# Patient Record
Sex: Female | Born: 1950 | Race: Black or African American | Hispanic: No | Smoking: Former smoker
Health system: Southern US, Community
[De-identification: ages and names within clinical notes are randomized; demographics above are authoritative.]

## PROBLEM LIST (undated history)

## (undated) DIAGNOSIS — R0602 Shortness of breath: Secondary | ICD-10-CM

## (undated) DIAGNOSIS — I10 Essential (primary) hypertension: Secondary | ICD-10-CM

## (undated) DIAGNOSIS — J189 Pneumonia, unspecified organism: Secondary | ICD-10-CM

## (undated) DIAGNOSIS — E538 Deficiency of other specified B group vitamins: Secondary | ICD-10-CM

## (undated) DIAGNOSIS — E7800 Pure hypercholesterolemia, unspecified: Secondary | ICD-10-CM

## (undated) DIAGNOSIS — N390 Urinary tract infection, site not specified: Secondary | ICD-10-CM

## (undated) DIAGNOSIS — R7303 Prediabetes: Secondary | ICD-10-CM

## (undated) DIAGNOSIS — M797 Fibromyalgia: Secondary | ICD-10-CM

## (undated) DIAGNOSIS — E119 Type 2 diabetes mellitus without complications: Secondary | ICD-10-CM

## (undated) DIAGNOSIS — E739 Lactose intolerance, unspecified: Secondary | ICD-10-CM

## (undated) DIAGNOSIS — K921 Melena: Secondary | ICD-10-CM

## (undated) DIAGNOSIS — E559 Vitamin D deficiency, unspecified: Secondary | ICD-10-CM

## (undated) DIAGNOSIS — K0889 Other specified disorders of teeth and supporting structures: Secondary | ICD-10-CM

## (undated) DIAGNOSIS — K5900 Constipation, unspecified: Secondary | ICD-10-CM

## (undated) DIAGNOSIS — F419 Anxiety disorder, unspecified: Secondary | ICD-10-CM

## (undated) DIAGNOSIS — M2550 Pain in unspecified joint: Secondary | ICD-10-CM

## (undated) DIAGNOSIS — M549 Dorsalgia, unspecified: Secondary | ICD-10-CM

## (undated) DIAGNOSIS — K635 Polyp of colon: Secondary | ICD-10-CM

## (undated) DIAGNOSIS — K219 Gastro-esophageal reflux disease without esophagitis: Secondary | ICD-10-CM

## (undated) DIAGNOSIS — J45909 Unspecified asthma, uncomplicated: Secondary | ICD-10-CM

## (undated) DIAGNOSIS — M109 Gout, unspecified: Secondary | ICD-10-CM

## (undated) DIAGNOSIS — K5792 Diverticulitis of intestine, part unspecified, without perforation or abscess without bleeding: Secondary | ICD-10-CM

## (undated) DIAGNOSIS — I509 Heart failure, unspecified: Secondary | ICD-10-CM

## (undated) HISTORY — PX: OVARIAN CYST REMOVAL: SHX89

## (undated) HISTORY — DX: Polyp of colon: K63.5

## (undated) HISTORY — DX: Fibromyalgia: M79.7

## (undated) HISTORY — DX: Urinary tract infection, site not specified: N39.0

## (undated) HISTORY — PX: LIPOMA EXCISION: SHX5283

## (undated) HISTORY — DX: Prediabetes: R73.03

## (undated) HISTORY — DX: Gastro-esophageal reflux disease without esophagitis: K21.9

## (undated) HISTORY — DX: Vitamin D deficiency, unspecified: E55.9

## (undated) HISTORY — DX: Melena: K92.1

## (undated) HISTORY — DX: Constipation, unspecified: K59.00

## (undated) HISTORY — DX: Lactose intolerance, unspecified: E73.9

## (undated) HISTORY — DX: Pneumonia, unspecified organism: J18.9

## (undated) HISTORY — DX: Pain in unspecified joint: M25.50

## (undated) HISTORY — PX: MENISCUS REPAIR: SHX5179

## (undated) HISTORY — PX: OVARIAN CYST SURGERY: SHX726

## (undated) HISTORY — DX: Dorsalgia, unspecified: M54.9

## (undated) HISTORY — PX: APPENDECTOMY: SHX54

## (undated) HISTORY — DX: Anxiety disorder, unspecified: F41.9

## (undated) HISTORY — DX: Heart failure, unspecified: I50.9

## (undated) HISTORY — DX: Shortness of breath: R06.02

## (undated) HISTORY — DX: Deficiency of other specified B group vitamins: E53.8

## (undated) HISTORY — DX: Other specified disorders of teeth and supporting structures: K08.89

---

## 2011-08-28 DIAGNOSIS — I10 Essential (primary) hypertension: Secondary | ICD-10-CM | POA: Insufficient documentation

## 2011-08-28 DIAGNOSIS — K219 Gastro-esophageal reflux disease without esophagitis: Secondary | ICD-10-CM | POA: Insufficient documentation

## 2013-08-11 DIAGNOSIS — E119 Type 2 diabetes mellitus without complications: Secondary | ICD-10-CM | POA: Insufficient documentation

## 2013-08-16 DIAGNOSIS — E876 Hypokalemia: Secondary | ICD-10-CM | POA: Insufficient documentation

## 2014-05-21 DIAGNOSIS — R200 Anesthesia of skin: Secondary | ICD-10-CM | POA: Insufficient documentation

## 2014-07-06 DIAGNOSIS — G5621 Lesion of ulnar nerve, right upper limb: Secondary | ICD-10-CM | POA: Insufficient documentation

## 2014-07-06 DIAGNOSIS — G5601 Carpal tunnel syndrome, right upper limb: Secondary | ICD-10-CM | POA: Insufficient documentation

## 2014-07-24 DIAGNOSIS — Z9889 Other specified postprocedural states: Secondary | ICD-10-CM | POA: Insufficient documentation

## 2015-06-19 DIAGNOSIS — R0602 Shortness of breath: Secondary | ICD-10-CM | POA: Insufficient documentation

## 2015-06-19 DIAGNOSIS — J45909 Unspecified asthma, uncomplicated: Secondary | ICD-10-CM | POA: Insufficient documentation

## 2016-02-18 DIAGNOSIS — Z6841 Body Mass Index (BMI) 40.0 and over, adult: Secondary | ICD-10-CM | POA: Insufficient documentation

## 2016-08-07 ENCOUNTER — Ambulatory Visit (INDEPENDENT_AMBULATORY_CARE_PROVIDER_SITE_OTHER): Payer: BC Managed Care – PPO

## 2016-08-07 ENCOUNTER — Ambulatory Visit (INDEPENDENT_AMBULATORY_CARE_PROVIDER_SITE_OTHER): Payer: BC Managed Care – PPO | Admitting: Podiatry

## 2016-08-07 ENCOUNTER — Encounter: Payer: Self-pay | Admitting: Podiatry

## 2016-08-07 DIAGNOSIS — M722 Plantar fascial fibromatosis: Secondary | ICD-10-CM | POA: Diagnosis not present

## 2016-08-07 DIAGNOSIS — E559 Vitamin D deficiency, unspecified: Secondary | ICD-10-CM | POA: Insufficient documentation

## 2016-08-07 NOTE — Progress Notes (Signed)
   Subjective:    Patient ID: Beth Macias, female    DOB: [birth year], 66 y.o.   MRN: [id number]  HPI: She presents today with a history of pain to her right foot 4 years. She states that as that on off recently started hurting severely. She states she is shooting pains into her leg. She has been seeing a podiatrist in [place]. 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    Review of Systems  Musculoskeletal: Positive for back pain and gait problem.  All other systems reviewed and are negative.      Objective:   Physical Exam: Vital signs are stable alert and oriented 3. Pulses are palpable. Neurologic sensorium is intact. Deep tendon reflexes are intact. Muscle strength is 5 over 5 dorsiflexion plantar flexors and inverters everters all intrinsic musculature is intact. Orthopedic evaluation of his wrist pain on palpation medial calcaneal tubercle of the right heel. Radiographs demonstrate pes planus with severe plantar distally oriented calcaneal spur and thickening of the plantar fascia surrounding the spur. Is indicative of plantar fasciitis. Cutaneous evaluation of his recent well-hydrated cutis no lesions or wounds are noted.        Assessment & Plan:  Plantar fasciitis with a history of diabetes right foot.  Plan: Discussed etiology and pathology conservative versus surgical therapies. Injected the right heel today placed her in a brace and a night splint. Discussed appropriate shoe gear stretching exercises ice therapy and shoe gear modifications. Started her on no medications at this time due to her diabetes. I will follow up with her in 1 month.

## 2016-08-07 NOTE — Patient Instructions (Signed)

## 2016-09-04 ENCOUNTER — Encounter: Payer: Self-pay | Admitting: Podiatry

## 2016-09-04 ENCOUNTER — Ambulatory Visit (INDEPENDENT_AMBULATORY_CARE_PROVIDER_SITE_OTHER): Payer: BC Managed Care – PPO | Admitting: Podiatry

## 2016-09-04 DIAGNOSIS — M722 Plantar fascial fibromatosis: Secondary | ICD-10-CM

## 2016-09-05 ENCOUNTER — Telehealth: Payer: Self-pay | Admitting: *Deleted

## 2016-09-05 NOTE — Telephone Encounter (Addendum)
-----   Message from Roney Jaffe, RN sent at 09/04/2016  3:59 PM EDT ----- MRI orders for Rt heel r/o plantar fascial rupture, orders are in  [place]. 09/05/2016-Orders given to [facility] for pre-cert and faxed to [facility].09/17/2016-[employer] denied MRI of right heel. Routed the appeals line [phone] to speak with AIM Physician Reviewer.09/26/2016-Faxed letter with copy of clinicals requesting appeal for MRI of right heel [id number]/23/2016-I called pt to see if she had any improvement in the right heel pain and if she wanted to continue with getting an MRI. 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 Pt states she has a problem in the 5th and 6th disc of her back and is currently working on that. I told pt I could get her in to see Dr. Milinda Pointer, but she states she would like to wait until 12/2016 to be seen again.

## 2016-09-07 NOTE — Progress Notes (Signed)
She presents today for follow-up of plantar fasciitis to the right foot. She states that she is having significantly more pain at this point. She states that initially it had improved considerably but has worsens. She would like to have the spurs removed.  Objective: Vital signs are stable she is alert and oriented 3. Pulses are palpable. I highly recommended to her today not to have this spurs removed. That it is really only the plantar fascia. I demonstrated that today by palpate the plantar medial aspect of the foot. She understands that.  Assessment: Plantar fasciitis right foot intractable.  Plan: Requested MRI for surgical evaluation.

## 2016-09-17 NOTE — Telephone Encounter (Signed)
Need case number etc.

## 2016-09-26 ENCOUNTER — Encounter: Payer: Self-pay | Admitting: *Deleted

## 2017-08-12 ENCOUNTER — Other Ambulatory Visit: Payer: Self-pay | Admitting: Family Medicine

## 2017-08-12 DIAGNOSIS — Z139 Encounter for screening, unspecified: Secondary | ICD-10-CM

## 2017-08-16 ENCOUNTER — Emergency Department (HOSPITAL_COMMUNITY)
Admission: EM | Admit: 2017-08-16 | Discharge: 2017-08-17 | Disposition: A | Discharge status: Home / Self Care | Payer: BC Managed Care – PPO | Attending: Emergency Medicine | Admitting: Emergency Medicine

## 2017-08-16 ENCOUNTER — Encounter (HOSPITAL_COMMUNITY): Payer: Self-pay

## 2017-08-16 ENCOUNTER — Other Ambulatory Visit: Payer: Self-pay

## 2017-08-16 ENCOUNTER — Emergency Department (HOSPITAL_COMMUNITY): Payer: BC Managed Care – PPO

## 2017-08-16 DIAGNOSIS — Z79899 Other long term (current) drug therapy: Secondary | ICD-10-CM | POA: Diagnosis not present

## 2017-08-16 DIAGNOSIS — I10 Essential (primary) hypertension: Secondary | ICD-10-CM | POA: Diagnosis not present

## 2017-08-16 DIAGNOSIS — Z7984 Long term (current) use of oral hypoglycemic drugs: Secondary | ICD-10-CM | POA: Insufficient documentation

## 2017-08-16 DIAGNOSIS — E7800 Pure hypercholesterolemia, unspecified: Secondary | ICD-10-CM | POA: Insufficient documentation

## 2017-08-16 DIAGNOSIS — E119 Type 2 diabetes mellitus without complications: Secondary | ICD-10-CM | POA: Insufficient documentation

## 2017-08-16 DIAGNOSIS — M545 Low back pain: Secondary | ICD-10-CM | POA: Diagnosis present

## 2017-08-16 DIAGNOSIS — M5431 Sciatica, right side: Secondary | ICD-10-CM | POA: Diagnosis not present

## 2017-08-16 DIAGNOSIS — J45909 Unspecified asthma, uncomplicated: Secondary | ICD-10-CM | POA: Insufficient documentation

## 2017-08-16 HISTORY — DX: Pure hypercholesterolemia, unspecified: E78.00

## 2017-08-16 HISTORY — DX: Unspecified asthma, uncomplicated: J45.909

## 2017-08-16 HISTORY — DX: Essential (primary) hypertension: I10

## 2017-08-16 HISTORY — DX: Type 2 diabetes mellitus without complications: E11.9

## 2017-08-16 MED ORDER — NAPROXEN 500 MG PO TABS: 500.0000 mg | ORAL_TABLET | Freq: Two times a day (BID) | ORAL | 0 refills | Status: DC

## 2017-08-16 MED ORDER — LIDOCAINE 5 % EX PTCH: 1.0000 | MEDICATED_PATCH | CUTANEOUS | 0 refills | Status: DC

## 2017-08-16 MED ORDER — HYDROCODONE-ACETAMINOPHEN 5-325 MG PO TABS
1.0000 | ORAL_TABLET | Freq: Once | ORAL | Status: AC
  Administered 2017-08-16: 1 via ORAL
  Filled 2017-08-16: qty 1

## 2017-08-16 NOTE — ED Triage Notes (Signed)
Onset this morning pt pulled and pushed a cart and felt a "pull" in right lower back.  Pt sat on floor and tried to do some exercise stretches with little relief.  Pt took Naproxen x 2 @ 6pm with little relief.  Hurts worse when trying to stand and straighten up.

## 2017-08-17 NOTE — ED Notes (Signed)
Pt able to ambulate slowly, independently. Pt verbalizes understanding of d/c instructions. Pt received prescriptions. Pt taken out to lobby at d/c with all belongings.

## 2017-08-17 NOTE — Discharge Instructions (Signed)
Take naproxen twice a day with meals.  Do not take other anti-inflammatories at the same time (Advil, Motrin, ibuprofen, Aleve). You may supplement with Tylenol if you need further pain control. Use lidocaine patches as needed for further pain. Try not to remain seated, this will make your symptoms worse. You may try gentle stretches to help with pain. Follow-up with the orthopedic doctor if your symptoms are not improving. Return to the emergency room if you develop loss of bowel or bladder control, worsening weakness, inability to walk, or any new or concerning symptoms.

## 2017-08-17 NOTE — ED Provider Notes (Signed)
[facility] EMERGENCY DEPARTMENT Provider Note   CSN: [id number] Arrival date & time: 08/16/17  2044     History   Chief Complaint Chief Complaint  Patient presents with  . Back Pain    HPI Beth Macias is a 67 y.o. female presenting for evaluation of right-sided back/hip pain.  Patient states that she was pushing something this morning when she felt a pop in her right buttock.  She had acute onset pain.  This gradually worsened throughout the day.  She has associated abnormal sensation in her right leg that extends past her knee.  She states that she feels weak in the right leg.  She denies fall or trauma.  She states she has a history of sciatica, but this feels different.  She denies fevers, chills, loss of bowel or bladder control, history of cancer, or history of IV drug use.  She took 2 Aleve with mild improvement of symptoms.  Movement makes the pain worse, nothing makes it better.  She is a patient of Dr. Ron Agee with [facility].  She denies pain or injury elsewhere including of the left leg.  HPI  Past Medical History:  Diagnosis Date  . Asthma   . Diabetes mellitus without complication ([place])   . Hypercholesteremia   . Hypertension     Patient Active Problem List   Diagnosis Date Noted  . Vitamin D deficiency 08/07/2016  . Morbid obesity with BMI of 40.0-44.9, adult ([place]) 02/18/2016  . Extrinsic asthma without complication 41/32/4401  . SOB (shortness of breath) 06/19/2015  . S/P carpal tunnel release 07/24/2014  . S/P cubital tunnel release 07/24/2014  . Cubital tunnel syndrome, right 07/06/2014  . Right carpal tunnel syndrome 07/06/2014  . Numbness of right hand 05/21/2014  . Hypokalemia 08/16/2013  . Diabetes mellitus type 2, uncomplicated ([place]) 02/72/5366  . GERD (gastroesophageal reflux disease) 08/28/2011  . HTN (hypertension) 08/28/2011    Past Surgical History:  Procedure Laterality Date  . APPENDECTOMY    . CESAREAN SECTION      x 3  . KNEE SURGERY    . LIPOMA EXCISION    . OVARIAN CYST REMOVAL       OB History   None      Home Medications    Prior to Admission medications   Medication Sig Start Date End Date Taking? Authorizing Provider  amLODipine (NORVASC) 5 MG tablet TK 1 T PO ONCE D 05/08/16   [provider]  colchicine 0.6 MG tablet TK 2 T PO FOR THE FIRST DOSE THEN 1 T ONE HOUR AFTER THE FIRST DOSE 07/12/16   [provider]  HYDROcodone-acetaminophen (NORCO/VICODIN) 5-325 MG tablet TAKE 1 TABLET BY MOUTH EVERY 6 TO 8 HOURS AS NEEDED 05/11/16   [provider]  lidocaine (LIDODERM) 5 % Place 1 patch onto the skin daily. Remove & Discard patch within 12 hours or as directed by MD 08/16/17   Keveon Amsler, PA-C  metFORMIN (GLUCOPHAGE-XR) 500 MG 24 hr tablet  07/01/16   [provider]  naproxen (NAPROSYN) 500 MG tablet Take 1 tablet (500 mg total) by mouth 2 (two) times daily with a meal. 08/16/17   Baer Hinton, PA-C  PROAIR HFA 108 (90 Base) MCG/ACT inhaler INHALE 2 PUFFS PO Q 4 H PRN FOR WHEEZING 05/08/16   [provider]  triamterene-hydrochlorothiazide (MAXZIDE-25) 37.5-25 MG tablet TK 1 T PO QD 06/10/16   [provider]    Family History History reviewed. No pertinent family  history.  Social History Social History   Tobacco Use  . Smoking status: Never Smoker  . Smokeless tobacco: Never Used  Substance Use Topics  . Alcohol use: Never    Frequency: Never  . Drug use: Never     Allergies   Penicillins and Tape   Review of Systems Review of Systems  Musculoskeletal: Positive for back pain and myalgias.  Skin: Negative for wound.  Neurological: Negative for numbness.     Physical Exam Updated Vital Signs BP (!) 150/83   Pulse 78   Temp 98 F (36.7 C) (Oral)   Resp 18   Ht 5' (1.524 m)   Wt 102.1 kg (225 lb)   SpO2 98%   BMI 43.94 kg/m   Physical Exam  Constitutional: She is oriented to person, place,  and time. She appears well-developed and well-nourished. No distress.  HENT:  Head: Normocephalic and atraumatic.  Eyes: Pupils are equal, round, and reactive to light. Conjunctivae and EOM are normal.  Neck: Normal range of motion. Neck supple.  Cardiovascular: Normal rate, regular rhythm and intact distal pulses.  Pulmonary/Chest: Effort normal and breath sounds normal. No respiratory distress. She has no wheezes.  Abdominal: Soft. She exhibits no distension and no mass. There is no tenderness. There is no guarding.  Musculoskeletal: She exhibits tenderness.  TTP of R lower back, R gluteus, and R hamstring. No obvious deformity. Pedal pulses intact bilaterally. Pt reports decreased sensation of lateral lower leg. No obvious strength difference.   Neurological: She is alert and oriented to person, place, and time. She displays normal reflexes.  Skin: Skin is warm and dry.  Psychiatric: She has a normal mood and affect.  Nursing note and vitals reviewed.    ED Treatments / Results  Labs (all labs ordered are listed, but only abnormal results are displayed) Labs Reviewed - No data to display  EKG None  Radiology Dg Hip Unilat W Or Wo Pelvis 2-3 Views Right  Result Date: 08/16/2017 CLINICAL DATA:  Right hip pain EXAM: DG HIP (WITH OR WITHOUT PELVIS) 2-3V RIGHT COMPARISON:  None. FINDINGS: There is no evidence of hip fracture or dislocation. Mild osteoarthrosis of both hips. IMPRESSION: No fracture or dislocation of the right hip. Mild bilateral hip osteoarthrosis. Electronically Signed   By: Ulyses Jarred M.D.   On: 08/16/2017 23:42    Procedures Procedures (including critical care time)  Medications Ordered in ED Medications  HYDROcodone-acetaminophen (NORCO/VICODIN) 5-325 MG per tablet 1 tablet (1 tablet Oral Given 08/16/17 2250)     Initial Impression / Assessment and Plan / ED Course  I have reviewed the triage vital signs and the nursing notes.  Pertinent labs & imaging  results that were available during my care of the patient were reviewed by me and considered in my medical decision making (see chart for details).     Patient presenting for evaluation of right-sided low back/buttock pain.  Physical exam shows patient without obvious neurologic deficits.  Tenderness palpation is worse over the gluteal aspect.  As she is 67 and heard a pop, will obtain x-ray to ensure no bony abnormality.  Norco for pain.  Case discussed with attending, Dr. Rogene Houston agrees to plan.  On reassessment, patient reports pain is much improved.  Ambulated in hallway without difficulty.  Likely sciatica.  At this time, doubt vertebral injury, infection, spinal cord compression, myelopathy, or cauda equina syndrome.  Discussed treatment with NSAIDs and lidocaine patch.  Patient does not want a muscle relaxer.  Follow-up with orthopedics as needed.  At this time, patient present for discharge.  Return precautions given.  Patient states she understands and agrees to plan.   Final Clinical Impressions(s) / ED Diagnoses   Final diagnoses:  Sciatica of right side    ED Discharge Orders        Ordered    naproxen (NAPROSYN) 500 MG tablet  2 times daily with meals     08/16/17 2357    lidocaine (LIDODERM) 5 %  Every 24 hours     08/16/17 2357       Sudiksha Victor, PA-C 08/17/17 0051    Fredia Sorrow, MD 08/20/17 [phone]

## 2017-08-25 ENCOUNTER — Other Ambulatory Visit: Payer: Self-pay | Admitting: Physical Medicine and Rehabilitation

## 2017-08-25 DIAGNOSIS — M79604 Pain in right leg: Secondary | ICD-10-CM

## 2017-08-25 DIAGNOSIS — M7918 Myalgia, other site: Secondary | ICD-10-CM

## 2017-08-25 DIAGNOSIS — R200 Anesthesia of skin: Secondary | ICD-10-CM

## 2017-08-25 DIAGNOSIS — M545 Low back pain: Secondary | ICD-10-CM

## 2017-08-25 DIAGNOSIS — R202 Paresthesia of skin: Secondary | ICD-10-CM

## 2017-08-26 ENCOUNTER — Ambulatory Visit
Admission: RE | Admit: 2017-08-26 | Discharge: 2017-08-26 | Disposition: A | Discharge status: Home / Self Care | Payer: BC Managed Care – PPO | Source: Ambulatory Visit | Attending: Physical Medicine and Rehabilitation | Admitting: Physical Medicine and Rehabilitation

## 2017-08-26 DIAGNOSIS — R200 Anesthesia of skin: Secondary | ICD-10-CM

## 2017-08-26 DIAGNOSIS — M7918 Myalgia, other site: Secondary | ICD-10-CM

## 2017-08-26 DIAGNOSIS — M79604 Pain in right leg: Secondary | ICD-10-CM

## 2017-08-26 DIAGNOSIS — R202 Paresthesia of skin: Secondary | ICD-10-CM

## 2017-08-26 DIAGNOSIS — M545 Low back pain: Secondary | ICD-10-CM

## 2017-08-31 ENCOUNTER — Ambulatory Visit
Admission: RE | Admit: 2017-08-31 | Discharge: 2017-08-31 | Disposition: A | Discharge status: Home / Self Care | Payer: BC Managed Care – PPO | Source: Ambulatory Visit | Attending: Family Medicine | Admitting: Family Medicine

## 2017-08-31 DIAGNOSIS — Z139 Encounter for screening, unspecified: Secondary | ICD-10-CM

## 2017-09-01 ENCOUNTER — Other Ambulatory Visit: Payer: Self-pay | Admitting: Physical Medicine and Rehabilitation

## 2017-09-01 DIAGNOSIS — M545 Low back pain: Principal | ICD-10-CM

## 2017-09-01 DIAGNOSIS — G8929 Other chronic pain: Secondary | ICD-10-CM

## 2017-09-09 ENCOUNTER — Ambulatory Visit
Admission: RE | Admit: 2017-09-09 | Discharge: 2017-09-09 | Disposition: A | Discharge status: Home / Self Care | Payer: BC Managed Care – PPO | Source: Ambulatory Visit | Attending: Physical Medicine and Rehabilitation | Admitting: Physical Medicine and Rehabilitation

## 2017-09-09 ENCOUNTER — Other Ambulatory Visit: Payer: Self-pay | Admitting: Physical Medicine and Rehabilitation

## 2017-09-09 DIAGNOSIS — G8929 Other chronic pain: Secondary | ICD-10-CM

## 2017-09-09 DIAGNOSIS — M545 Low back pain: Principal | ICD-10-CM

## 2017-09-09 MED ORDER — IOPAMIDOL (ISOVUE-M 200) INJECTION 41%
1.0000 mL | Freq: Once | INTRAMUSCULAR | Status: AC
  Administered 2017-09-09: 1 mL via EPIDURAL

## 2017-09-09 MED ORDER — METHYLPREDNISOLONE ACETATE 40 MG/ML INJ SUSP (RADIOLOG
120.0000 mg | Freq: Once | INTRAMUSCULAR | Status: AC
  Administered 2017-09-09: 120 mg via EPIDURAL

## 2017-09-09 NOTE — Discharge Instructions (Signed)

## 2017-09-23 ENCOUNTER — Other Ambulatory Visit: Payer: Self-pay | Admitting: Family Medicine

## 2017-09-23 DIAGNOSIS — Z1231 Encounter for screening mammogram for malignant neoplasm of breast: Secondary | ICD-10-CM

## 2017-10-02 ENCOUNTER — Other Ambulatory Visit: Payer: Self-pay | Admitting: Physical Medicine and Rehabilitation

## 2017-10-02 DIAGNOSIS — M79604 Pain in right leg: Secondary | ICD-10-CM

## 2017-10-08 ENCOUNTER — Other Ambulatory Visit: Payer: Self-pay | Admitting: Physical Medicine and Rehabilitation

## 2017-10-08 ENCOUNTER — Ambulatory Visit
Admission: RE | Admit: 2017-10-08 | Discharge: 2017-10-08 | Disposition: A | Discharge status: Home / Self Care | Payer: BC Managed Care – PPO | Source: Ambulatory Visit | Attending: Physical Medicine and Rehabilitation | Admitting: Physical Medicine and Rehabilitation

## 2017-10-08 DIAGNOSIS — M79604 Pain in right leg: Secondary | ICD-10-CM

## 2017-10-08 MED ORDER — IOPAMIDOL (ISOVUE-M 200) INJECTION 41%
1.0000 mL | Freq: Once | INTRAMUSCULAR | Status: AC
  Administered 2017-10-08: 1 mL via EPIDURAL

## 2017-10-08 MED ORDER — METHYLPREDNISOLONE ACETATE 40 MG/ML INJ SUSP (RADIOLOG
120.0000 mg | Freq: Once | INTRAMUSCULAR | Status: AC
  Administered 2017-10-08: 120 mg via EPIDURAL

## 2017-10-08 MED ORDER — DIAZEPAM 5 MG PO TABS
5.0000 mg | ORAL_TABLET | Freq: Once | ORAL | Status: AC
  Administered 2017-10-08: 5 mg via ORAL

## 2017-10-08 NOTE — Discharge Instructions (Signed)

## 2018-03-25 ENCOUNTER — Encounter: Payer: Self-pay | Admitting: Family Medicine

## 2018-03-25 LAB — HM DIABETES EYE EXAM

## 2018-04-04 DIAGNOSIS — N390 Urinary tract infection, site not specified: Secondary | ICD-10-CM | POA: Insufficient documentation

## 2018-04-04 DIAGNOSIS — J069 Acute upper respiratory infection, unspecified: Secondary | ICD-10-CM | POA: Insufficient documentation

## 2018-04-04 DIAGNOSIS — E119 Type 2 diabetes mellitus without complications: Secondary | ICD-10-CM | POA: Insufficient documentation

## 2018-04-07 ENCOUNTER — Emergency Department (HOSPITAL_COMMUNITY): Payer: BC Managed Care – PPO

## 2018-04-07 ENCOUNTER — Encounter (HOSPITAL_COMMUNITY): Payer: Self-pay

## 2018-04-07 ENCOUNTER — Emergency Department (HOSPITAL_COMMUNITY)
Admission: EM | Admit: 2018-04-07 | Discharge: 2018-04-07 | Disposition: A | Discharge status: Home / Self Care | Payer: BC Managed Care – PPO | Attending: Emergency Medicine | Admitting: Emergency Medicine

## 2018-04-07 ENCOUNTER — Encounter: Payer: Self-pay | Admitting: Gastroenterology

## 2018-04-07 ENCOUNTER — Other Ambulatory Visit: Payer: Self-pay

## 2018-04-07 DIAGNOSIS — J45909 Unspecified asthma, uncomplicated: Secondary | ICD-10-CM | POA: Diagnosis not present

## 2018-04-07 DIAGNOSIS — E119 Type 2 diabetes mellitus without complications: Secondary | ICD-10-CM | POA: Diagnosis not present

## 2018-04-07 DIAGNOSIS — I10 Essential (primary) hypertension: Secondary | ICD-10-CM | POA: Insufficient documentation

## 2018-04-07 DIAGNOSIS — J181 Lobar pneumonia, unspecified organism: Secondary | ICD-10-CM | POA: Insufficient documentation

## 2018-04-07 DIAGNOSIS — Z79899 Other long term (current) drug therapy: Secondary | ICD-10-CM | POA: Diagnosis not present

## 2018-04-07 DIAGNOSIS — J189 Pneumonia, unspecified organism: Secondary | ICD-10-CM

## 2018-04-07 DIAGNOSIS — R079 Chest pain, unspecified: Secondary | ICD-10-CM

## 2018-04-07 DIAGNOSIS — R0789 Other chest pain: Secondary | ICD-10-CM | POA: Diagnosis present

## 2018-04-07 HISTORY — DX: Gout, unspecified: M10.9

## 2018-04-07 HISTORY — DX: Diverticulitis of intestine, part unspecified, without perforation or abscess without bleeding: K57.92

## 2018-04-07 LAB — COMPREHENSIVE METABOLIC PANEL
ALT: 28 U/L (ref 0–44)
AST: 28 U/L (ref 15–41)
Albumin: 3.8 g/dL (ref 3.5–5.0)
Alkaline Phosphatase: 61 U/L (ref 38–126)
Anion gap: 12 (ref 5–15)
BUN: 12 mg/dL (ref 8–23)
CO2: 25 mmol/L (ref 22–32)
Calcium: 9.2 mg/dL (ref 8.9–10.3)
Chloride: 104 mmol/L (ref 98–111)
Creatinine, Ser: 0.73 mg/dL (ref 0.44–1.00)
GFR calc Af Amer: >60 mL/min (ref >60)
GFR calc non Af Amer: >60 mL/min (ref >60)
Glucose, Bld: 170 mg/dL — ABNORMAL HIGH (ref 70–99)
Potassium: 3.2 mmol/L — ABNORMAL LOW (ref 3.5–5.1)
Sodium: 141 mmol/L (ref 135–145)
Total Bilirubin: 0.8 mg/dL (ref 0.3–1.2)
Total Protein: 7 g/dL (ref 6.5–8.1)

## 2018-04-07 LAB — CBC
HCT: 38.6 % (ref 36.0–46.0)
Hemoglobin: 12 g/dL (ref 12.0–15.0)
MCH: 27.6 pg (ref 26.0–34.0)
MCHC: 31.1 g/dL (ref 30.0–36.0)
MCV: 88.7 fL (ref 80.0–100.0)
Platelets: 308 10*3/uL (ref 150–400)
RBC: 4.35 MIL/uL (ref 3.87–5.11)
RDW: 13.4 % (ref 11.5–15.5)
WBC: 5.5 10*3/uL (ref 4.0–10.5)
nRBC: 0 % (ref 0.0–0.2)

## 2018-04-07 LAB — ABO/RH: ABO/RH(D): O POS

## 2018-04-07 LAB — POCT I-STAT TROPONIN I
Troponin i, poc: 0.02 ng/mL (ref 0.00–0.08)
Troponin i, poc: 0.02 ng/mL (ref 0.00–0.08)

## 2018-04-07 LAB — TYPE AND SCREEN
ABO/RH(D): O POS
Antibody Screen: NEGATIVE

## 2018-04-07 MED ORDER — POTASSIUM CHLORIDE CRYS ER 20 MEQ PO TBCR
40.0000 meq | EXTENDED_RELEASE_TABLET | Freq: Once | ORAL | Status: AC
  Administered 2018-04-07: 40 meq via ORAL
  Filled 2018-04-07: qty 2

## 2018-04-07 MED ORDER — CEPHALEXIN 500 MG PO CAPS: 500.0000 mg | ORAL_CAPSULE | Freq: Three times a day (TID) | ORAL | 0 refills | Status: AC

## 2018-04-07 MED ORDER — AZITHROMYCIN 250 MG PO TABS: 250.0000 mg | ORAL_TABLET | Freq: Every day | ORAL | 0 refills | Status: DC

## 2018-04-07 NOTE — ED Triage Notes (Signed)
Patient c/o intermittent left chest pain and SOB that worsens with exertion. Patient also c/o rectal bleeding. Patient states she does have hemorrhoids.

## 2018-04-07 NOTE — ED Notes (Signed)
ED Provider at bedside. 

## 2018-04-07 NOTE — ED Provider Notes (Signed)
[facility] DEPT Provider Note   CSN: [id number] Arrival date & time: 04/07/18  1323     History   Chief Complaint Chief Complaint  Patient presents with  . Chest Pain    HPI Beth Macias is a 67 y.o. female.  HPI   11mos has been sick on and off, sciatica, seeing internist 10/29 saw PCP, thought had UTI but was not, had previously been diagnosed with it [facility] 11/8 on trip, drove 140 miles, by the time got there had burning in sinuses, headache, temperature, tried going to sleep and the temperature went up, urinating every hour, cough and congestion Sunday AM took something for pain, got up, went to the bathroom and had blood, has history of hemorrhoids, had bright red blood in toilet bowl since Sunday, thinks medication triggered it, Saturday-Tues had bright red blood, has used over the counter hemorrhoid suppositories, wipes, cold compresses  Came today because chest started to hurt, felt tight 1.5 weeks ago had spot on left shoulder with swelling, has caused pain, had seen PCP and diagnosed with bone spur, pain radiating down arm, this has continued, there is raised area.  Pain feels like a tightness to center of chest and radiates to the right or left, like an electrical shock, lasts a second then goes away, comes back unexpectedly, will happen at rest, spontaneously Got home from trip at Tuesday, then this AM decided to come here  Coughing but taking cough syrup, feeling warm at night, night sure if fevers anymore Started on the antibiotic Sunday AM, feels like it is loosening up a little, phlegm is green color.  Taking bactrim.   Past Medical History:  Diagnosis Date  . Asthma   . Diabetes mellitus without complication ([place])   . Diverticulitis   . Gout   . Hypercholesteremia   . Hypertension     Patient Active Problem List   Diagnosis Date Noted  . Vitamin D deficiency 08/07/2016  . Morbid obesity with BMI of 40.0-44.9, adult  ([place]) 02/18/2016  . Extrinsic asthma without complication 08/65/7846  . SOB (shortness of breath) 06/19/2015  . S/P carpal tunnel release 07/24/2014  . S/P cubital tunnel release 07/24/2014  . Cubital tunnel syndrome, right 07/06/2014  . Right carpal tunnel syndrome 07/06/2014  . Numbness of right hand 05/21/2014  . Hypokalemia 08/16/2013  . Diabetes mellitus type 2, uncomplicated ([place]) 96/29/5284  . GERD (gastroesophageal reflux disease) 08/28/2011  . HTN (hypertension) 08/28/2011    Past Surgical History:  Procedure Laterality Date  . APPENDECTOMY    . CESAREAN SECTION     x 3  . KNEE SURGERY    . LIPOMA EXCISION    . OVARIAN CYST REMOVAL       OB History   None      Home Medications    Prior to Admission medications   Medication Sig Start Date End Date Taking? Authorizing Provider  amLODipine (NORVASC) 10 MG tablet Take 10 mg by mouth daily.   Yes [provider]  Cholecalciferol (VITAMIN D3) 125 MCG (5000 UT) CAPS Take 10,000 Units by mouth at bedtime.   Yes [provider]  fexofenadine (ALLEGRA) 180 MG tablet Take 180 mg by mouth daily.   Yes [provider]  Fluticasone-Salmeterol (WIXELA INHUB) 250-50 MCG/DOSE AEPB Inhale 1 puff into the lungs 2 (two) times daily.   Yes [provider]  ipratropium (ATROVENT) 0.06 % nasal spray Place 2 sprays into both nostrils daily as needed for rhinitis.  Yes [provider]  lidocaine (LIDODERM) 5 % Place 1 patch onto the skin daily. Remove & Discard patch within 12 hours or as directed by MD 08/16/17  Yes Caccavale, Sophia, PA-C  metFORMIN (GLUCOPHAGE-XR) 750 MG 24 hr tablet Take 750 mg by mouth every evening.   Yes [provider]  montelukast (SINGULAIR) 10 MG tablet Take 10 mg by mouth daily.   Yes [provider]  omeprazole (PRILOSEC) 20 MG capsule Take 20 mg by mouth daily.   Yes [provider]  PROAIR HFA 108 (90 Base) MCG/ACT inhaler Inhale 2 puffs  into the lungs every 4 (four) hours as needed for wheezing.  05/08/16  Yes [provider]  azithromycin (ZITHROMAX) 250 MG tablet Take 1 tablet (250 mg total) by mouth daily. Take first 2 tablets together, then 1 every day until finished. 04/07/18   Gareth Morgan, MD  cephALEXin (KEFLEX) 500 MG capsule Take 1 capsule (500 mg total) by mouth 3 (three) times daily for 7 days. 04/07/18 04/14/18  Gareth Morgan, MD  naproxen (NAPROSYN) 500 MG tablet Take 1 tablet (500 mg total) by mouth 2 (two) times daily with a meal. Patient not taking: Reported on 04/07/2018 08/16/17   Franchot Heidelberg, PA-C    Family History Family History  Problem Relation Age of Onset  . Heart failure Mother   . Gout Mother   . Hypertension Mother   . Heart failure Father   . Aneurysm Father   . Hypertension Father   . Cancer Sister   . Heart failure Brother   . Gout Brother   . Hypertension Brother     Social History Social History   Tobacco Use  . Smoking status: Never Smoker  . Smokeless tobacco: Never Used  Substance Use Topics  . Alcohol use: Never    Frequency: Never  . Drug use: Never     Allergies   Penicillins; Tape; and Gabapentin   Review of Systems Review of Systems  Constitutional: Positive for fatigue and fever.  HENT: Positive for congestion.   Eyes: Negative for visual disturbance.  Respiratory: Positive for cough and shortness of breath.   Cardiovascular: Positive for chest pain. Negative for leg swelling.  Gastrointestinal: Positive for anal bleeding. Negative for abdominal pain, diarrhea, nausea and vomiting.  Genitourinary: Positive for dysuria (hesitancy) and frequency.  Musculoskeletal: Positive for arthralgias (left shoulder) and myalgias (sciatica, not changed).  Skin: Negative for rash.  Neurological: Positive for headaches.     Physical Exam Updated Vital Signs BP (!) 161/101   Pulse (!) 102   Temp 98.7 F (37.1 C) (Oral)   Resp 16   Ht 5' (1.524  m)   Wt 104.3 kg   SpO2 97%   BMI 44.92 kg/m   Physical Exam  Constitutional: She is oriented to person, place, and time. She appears well-developed and well-nourished. No distress.  HENT:  Head: Normocephalic and atraumatic.  Eyes: Conjunctivae and EOM are normal.  Neck: Normal range of motion.  Cardiovascular: Normal rate, regular rhythm, normal heart sounds and intact distal pulses. Exam reveals no gallop and no friction rub.  No murmur heard. Pulmonary/Chest: Effort normal and breath sounds normal. No respiratory distress. She has no wheezes. She has no rales.  Abdominal: Soft. She exhibits no distension. There is no tenderness. There is no guarding.  Musculoskeletal: She exhibits no edema or tenderness.  Neurological: She is alert and oriented to person, place, and time.  Skin: Skin is warm and dry. No  rash noted. She is not diaphoretic. No erythema.  Nursing note and vitals reviewed.    ED Treatments / Results  Labs (all labs ordered are listed, but only abnormal results are displayed) Labs Reviewed  COMPREHENSIVE METABOLIC PANEL - Abnormal; Notable for the following components:      Result Value   Potassium 3.2 (*)    Glucose, Bld 170 (*)    All other components within normal limits  CBC  I-STAT TROPONIN, ED  POCT I-STAT TROPONIN I  I-STAT TROPONIN, ED  POCT I-STAT TROPONIN I  TYPE AND SCREEN  ABO/RH    EKG EKG Interpretation  Date/Time:  Wednesday April 07 2018 13:35:50 EST Ventricular Rate:  101 PR Interval:    QRS Duration: 90 QT Interval:  355 QTC Calculation: 461 R Axis:   -55 Text Interpretation:  Sinus tachycardia Ventricular premature complex Left anterior fascicular block Consider anterior infarct No previous ECGs available Confirmed by Gareth Morgan [phone]) on 04/07/2018 3:06:34 PM Also confirmed by Gareth Morgan [phone]), editor Philomena Doheny [phone])  on 04/07/2018 3:28:26 PM   Radiology Dg Chest 2 View  Result Date:  04/07/2018 CLINICAL DATA:  67 year old female with intermittent left chest pain and shortness of breath worse with exertion EXAM: CHEST - 2 VIEW COMPARISON:  None. FINDINGS: Cardiomegaly with left heart enlargement. The mediastinal contours are within normal limits. Focal patchy airspace opacity in the lingula is nonspecific. No evidence of pulmonary edema, pleural effusion or pneumothorax. No suspicious nodule or mass. Multilevel degenerative endplate spurring throughout the visualized spine. No acute osseous abnormality. IMPRESSION: 1. Cardiomegaly with left ventricular enlargement. 2. Nonspecific lingular airspace opacity may reflect atelectasis, scarring, or in the appropriate clinical setting, early pneumonia. Electronically Signed   By: Jacqulynn Cadet M.D.   On: 04/07/2018 14:13    Procedures Procedures (including critical care time)  Medications Ordered in ED Medications  potassium chloride SA (K-DUR,KLOR-CON) CR tablet 40 mEq (40 mEq Oral Given 04/07/18 1650)     Initial Impression / Assessment and Plan / ED Course  I have reviewed the triage vital signs and the nursing notes.  Pertinent labs & imaging results that were available during my care of the patient were reviewed by me and considered in my medical decision making (see chart for details).     67yo female with history above including history of htn, DM, asthma, family hx of CAD presents with concern for cough, chest pain.  Patient also reports history of hemorrhoids and bright red blood per rectum for 4 days. Given hemodynamics, normal hgb, doubt clinically significant GI bleed and bright red blood described with hx of hemorrhoids likely represents continuation of this. Recommend continued supportive care, sitz bathes, outpt follow up.  Regarding chest pain, ECG without acute findings.  Describes cough productive of green sputum, and chest XR concerning for lingular pneumonia.  Suspect in setting of these symptoms chest pain  also likely secondary to pneumonia. Delta troponins negative, no sign of ACS. Doubt PE in setting of subjective fevers and productive cough with XR findings concerning for pneumonia.  Patient does have family hx and risk factors for CAD and feel Cardiology follow up is reasonable.  Given rx for keflex, azithromycin. Told to stop bactrim that had been rx for UTI.  Patient discharged in stable condition with understanding of reasons to return.   Final Clinical Impressions(s) / ED Diagnoses   Final diagnoses:  Community acquired pneumonia of left lower lobe of lung ([place])  Nonspecific chest pain  ED Discharge Orders         Ordered    azithromycin (ZITHROMAX) 250 MG tablet  Daily,   Status:  Discontinued     04/07/18 1832    cephALEXin (KEFLEX) 500 MG capsule  3 times daily     04/07/18 1832    azithromycin (ZITHROMAX) 250 MG tablet  Daily     04/07/18 1859           Gareth Morgan, MD 04/08/18 1320

## 2018-04-14 ENCOUNTER — Ambulatory Visit: Payer: BC Managed Care – PPO | Admitting: Gastroenterology

## 2018-04-14 ENCOUNTER — Encounter: Payer: Self-pay | Admitting: Gastroenterology

## 2018-04-14 VITALS — BP 140/86 | HR 84 | Ht 60.0 in | Wt 224.1 lb

## 2018-04-14 DIAGNOSIS — K649 Unspecified hemorrhoids: Secondary | ICD-10-CM | POA: Diagnosis not present

## 2018-04-14 DIAGNOSIS — K625 Hemorrhage of anus and rectum: Secondary | ICD-10-CM | POA: Diagnosis not present

## 2018-04-14 MED ORDER — NA SULFATE-K SULFATE-MG SULF 17.5-3.13-1.6 GM/177ML PO SOLN: 1.0000 | Freq: Once | ORAL | 0 refills | Status: AC

## 2018-04-14 NOTE — Patient Instructions (Addendum)
You have been scheduled for a colonoscopy. Please follow written instructions given to you at your visit today.  Please pick up your prep supplies at the pharmacy within the next 1-3 days. If you use inhalers (even only as needed), please bring them with you on the day of your procedure.  HOLD Metformin the AM of your procedure (colonoscopy)   Start Benefiber 1 teaspoon three times a day  Increase water intake 60 - 80 oz daily  If you are age 68 or older, your body mass index should be between 23-30. Your Body mass index is 43.77 kg/m. If this is out of the aforementioned range listed, please consider follow up with your Primary Care Provider.  If you are age 50 or younger, your body mass index should be between 19-25. Your Body mass index is 43.77 kg/m. If this is out of the aformentioned range listed, please consider follow up with your Primary Care Provider.    Thank you for choosing [facility]  Karleen Hampshire Nand[place],MD

## 2018-04-14 NOTE — Progress Notes (Signed)
Beth Macias    [id number]    01/01/1951  Primary Care Physician:Via, Lennette Bihari, MD  Referring Physician: No referring provider defined for this encounter.  Chief complaint: Rectal bleeding, hemorrhoids  HPI:  67 year old female here with complaints of intermittent bright red blood per rectum , worse in the last 2 weeks. She has   Bright red blood when she wipes after a bowel movement or even urinates, sometimes also squirting bright red in toilet bowel  Taking Metamucil, somewhat better  She has loss of appetitie, and also lost 6 lbs unintentionally  Recently diagnosed with pneumonia, prior to that she had UTI and sinus infection, currently on antibiotics.   Denies any changes in bowel habits  Reviewed labs from PMD, normal hemoglobin and hematocrit.  History of chronic irritable bowel syndrome, bloating and excessive flatulence  She had colonoscopy in 2011 at [facility] with no polyps, small internal hemorrhoids, report is not available during this office visit  Maternal uncle had colon cancer and paternal aunt has history of IBD  Denies any dysphagia, odynophagia, abdominal pain, nausea, vomiting or melena.   Outpatient Encounter Medications as of 04/14/2018  Medication Sig  . amLODipine (NORVASC) 10 MG tablet Take 10 mg by mouth daily.  . cephALEXin (KEFLEX) 500 MG capsule Take 1 capsule (500 mg total) by mouth 3 (three) times daily for 7 days.  . Cholecalciferol (VITAMIN D3) 125 MCG (5000 UT) CAPS Take 10,000 Units by mouth at bedtime.  . fexofenadine (ALLEGRA) 180 MG tablet Take 180 mg by mouth daily.  . Fluticasone-Salmeterol (WIXELA INHUB) 250-50 MCG/DOSE AEPB Inhale 1 puff into the lungs 2 (two) times daily.  Marland Kitchen ipratropium (ATROVENT) 0.06 % nasal spray Place 2 sprays into both nostrils daily as needed for rhinitis.  Marland Kitchen lidocaine (LIDODERM) 5 % Place 1 patch onto the skin daily. Remove & Discard patch within 12 hours or as directed by MD  .  metFORMIN (GLUCOPHAGE-XR) 750 MG 24 hr tablet Take 750 mg by mouth every evening.  . montelukast (SINGULAIR) 10 MG tablet Take 10 mg by mouth daily.  . naproxen (NAPROSYN) 500 MG tablet Take 1 tablet (500 mg total) by mouth 2 (two) times daily with a meal.  . omeprazole (PRILOSEC) 20 MG capsule Take 20 mg by mouth daily.  Marland Kitchen PROAIR HFA 108 (90 Base) MCG/ACT inhaler Inhale 2 puffs into the lungs every 4 (four) hours as needed for wheezing.   . [DISCONTINUED] azithromycin (ZITHROMAX) 250 MG tablet Take 1 tablet (250 mg total) by mouth daily. Take first 2 tablets together, then 1 every day until finished. (Patient not taking: Reported on 04/14/2018)   No facility-administered encounter medications on file as of 04/14/2018.     Allergies as of 04/14/2018 - Review Complete 04/14/2018  Allergen Reaction Noted  . Penicillins Itching   . Tape Other (See Comments) 07/11/2014  . Gabapentin Itching, Rash, and Other (See Comments) 04/07/2018    Past Medical History:  Diagnosis Date  . Asthma   . Diabetes mellitus without complication ([place])   . Diverticulitis   . Fibromyalgia   . GERD (gastroesophageal reflux disease)   . Gout   . Hypercholesteremia   . Hypertension   . Pneumonia   . Pneumonia     Past Surgical History:  Procedure Laterality Date  . APPENDECTOMY    . CESAREAN SECTION     x 3  . LIPOMA EXCISION    . MENISCUS REPAIR Left   .  OVARIAN CYST REMOVAL      Family History  Problem Relation Age of Onset  . Heart failure Mother   . Gout Mother   . Hypertension Mother   . Diabetes Mother   . Heart failure Father   . Hypertension Father   . AAA (abdominal aortic aneurysm) Father   . Gout Brother   . Hypertension Brother   . Heart attack Brother   . Colon cancer Maternal Uncle   . Crohn's disease Paternal Aunt     Social History   Socioeconomic History  . Marital status: Legally Separated    Spouse name: Not on file  . Number of children: 3  . Years of education:  Not on file  . Highest education level: Not on file  Occupational History  . Occupation: Pharmacist, hospital  Social Needs  . Financial resource strain: Not on file  . Food insecurity:    Worry: Not on file    Inability: Not on file  . Transportation needs:    Medical: Not on file    Non-medical: Not on file  Tobacco Use  . Smoking status: Never Smoker  . Smokeless tobacco: Never Used  Substance and Sexual Activity  . Alcohol use: Never    Frequency: Never  . Drug use: Never  . Sexual activity: Not on file  Lifestyle  . Physical activity:    Days per week: Not on file    Minutes per session: Not on file  . Stress: Not on file  Relationships  . Social connections:    Talks on phone: Not on file    Gets together: Not on file    Attends religious service: Not on file    Active member of club or organization: Not on file    Attends meetings of clubs or organizations: Not on file    Relationship status: Not on file  . Intimate partner violence:    Fear of current or ex partner: Not on file    Emotionally abused: Not on file    Physically abused: Not on file    Forced sexual activity: Not on file  Other Topics Concern  . Not on file  Social History Narrative  . Not on file      Review of systems: Review of Systems  Constitutional: Negative for fever and chills.  Positive for fatigue HENT: Positive for sinus trouble Eyes: Negative for blurred vision.  Respiratory: Positive for cough, shortness of breath and wheezing.   Cardiovascular: Negative for chest pain and palpitations.  Gastrointestinal: as per HPI Genitourinary: Negative for dysuria, urgency, and hematuria.  Positive for urinary frequency Musculoskeletal: Positive for myalgias, back pain and joint pain.  Skin: Negative for itching and rash.  Neurological: Negative for dizziness, tremors, focal weakness, seizures and loss of consciousness.  Endo/Heme/Allergies: Positive for seasonal allergies.  Psychiatric/Behavioral:  Negative for depression, suicidal ideas and hallucinations.  All other systems reviewed and are negative.   Physical Exam: Vitals:   04/14/18 1015  BP: 140/86  Pulse: 84   Body mass index is 43.77 kg/m. Gen:      No acute distress HEENT:  EOMI, sclera anicteric Neck:     No masses; no thyromegaly Lungs:    Clear to auscultation bilaterally; normal respiratory effort CV:         Regular rate and rhythm; no murmurs Abd:      + bowel sounds; soft, non-tender; no palpable masses, no distension Ext:    No edema; adequate peripheral perfusion  Skin:      Warm and dry; no rash Neuro: alert and oriented x 3 Psych: normal mood and affect Rectal exam: Normal anal sphincter tone, no anal fissure or external hemorrhoids Anoscopy: Grade 2 internal hemorrhoids in the left lateral, right posterior and right anterior position.No active bleeding, normal dentate line, no visible nodules  Data Reviewed:  Reviewed labs, radiology imaging, old records and pertinent past GI work up   Assessment and Plan/Recommendations:  67 year old female with history of diabetes, chronic GERD, hypertension, obesity, asthma, fibromyalgia, chronic irritable bowel syndrome here with complaints of intermittent bright red blood per rectum. Likely etiology of rectal bleeding is small volume hemorrhage from internal hemorrhoids but cannot exclude rectal neoplastic lesion. Given the last colonoscopy was almost 9 years ago, we will schedule for colonoscopy prior to hemorrhoidal band ligation to exclude any other etiology Patient will return after colonoscopy for hemorrhoidal band ligation Benefiber 1 teaspoon 3 times daily  The risks and benefits as well as alternatives of endoscopic procedure(s) have been discussed and reviewed. All questions answered. The patient agrees to proceed.   Damaris Hippo , MD [phone]    CC: No ref. provider found

## 2018-04-21 ENCOUNTER — Encounter: Payer: Self-pay | Admitting: Gastroenterology

## 2018-04-26 ENCOUNTER — Ambulatory Visit: Payer: BC Managed Care – PPO | Admitting: Internal Medicine

## 2018-04-26 ENCOUNTER — Ambulatory Visit (INDEPENDENT_AMBULATORY_CARE_PROVIDER_SITE_OTHER)
Admission: RE | Admit: 2018-04-26 | Discharge: 2018-04-26 | Disposition: A | Discharge status: Home / Self Care | Payer: BC Managed Care – PPO | Source: Ambulatory Visit | Attending: Internal Medicine | Admitting: Internal Medicine

## 2018-04-26 ENCOUNTER — Encounter: Payer: Self-pay | Admitting: Internal Medicine

## 2018-04-26 DIAGNOSIS — J4530 Mild persistent asthma, uncomplicated: Secondary | ICD-10-CM | POA: Diagnosis not present

## 2018-04-26 DIAGNOSIS — Z6841 Body Mass Index (BMI) 40.0 and over, adult: Secondary | ICD-10-CM | POA: Diagnosis not present

## 2018-04-26 MED ORDER — MOMETASONE FURO-FORMOTEROL FUM 100-5 MCG/ACT IN AERO: 2.0000 | INHALATION_SPRAY | Freq: Two times a day (BID) | RESPIRATORY_TRACT | 11 refills | Status: DC

## 2018-04-26 MED ORDER — MOMETASONE FURO-FORMOTEROL FUM 100-5 MCG/ACT IN AERO: 2.0000 | INHALATION_SPRAY | Freq: Two times a day (BID) | RESPIRATORY_TRACT | 0 refills | Status: DC

## 2018-04-26 NOTE — Progress Notes (Signed)
Beth Macias, female    DOB: [birth year],    MRN: [id number]   Brief patient profile:  19 yowf never regular smoker quit completely in 1978 with h/o asthma/atopy "all her life"  with baseline on "maint allegra" with no need for any inhalers or singulair then acutely worse Oct 26th 2019 with a head cold to chest with cough, sore throat, chest /nasal congestion and green sputum >  PCP  Nebulizer rx started advair, cough med, singulair > went to [place] when no better >>  added abx for possible sinus/UTI  infection>  [facility]  04/07/18 ? Lingular pna rx Keflex / zpak and  referred to pulmonary clinic 04/26/2018 by Dr   Lennette Bihari Via    04/26/2018  Pulmonary/ 1st office eval/Beth Macias  Chief Complaint  Patient presents with  . Pulmonary Consult    Referred by Dr. Lennette Bihari Via. She c/o  dry cough and hoarseness.  She states she had PNA approx 3 wks ago.   Dyspnea:  Legs stop before breathing usually   Cough: dry worse at hs then gets to sleep fine  Sleep: on side 2 pillows  SABA use: several days ago  No obvious day to day or daytime variability or assoc excess/ purulent sputum or mucus plugs or hemoptysis or cp or chest tightness, subjective wheeze or overt sinus or hb symptoms.   Sleeping as above  without nocturnal  or early am exacerbation  of respiratory  c/o's or need for noct saba. Also denies any obvious fluctuation of symptoms with weather or environmental changes or other aggravating or alleviating factors except as outlined above   No unusual exposure hx or h/o childhood pna or  knowledge of premature birth.  Current Allergies, Complete Past Medical History, Past Surgical History, Family History, and Social History were reviewed in [employer] record.  ROS  The following are not active complaints unless bolded Hoarseness, sore throat, dysphagia, dental problems, itching, sneezing,  nasal congestion or discharge of excess mucus or purulent secretions, ear ache,    fever, chills, sweats, unintended wt loss or wt gain, classically pleuritic or exertional cp,  orthopnea pnd or arm/hand swelling  or leg swelling, presyncope, palpitations, abdominal pain, anorexia, nausea, vomiting, diarrhea  or change in bowel habits or change in bladder habits, change in stools or change in urine, dysuria, hematuria,  rash, arthralgias, visual complaints, headache, numbness, weakness or ataxia or problems with walking or coordination,  change in mood or  memory.           Past Medical History:  Diagnosis Date  . Asthma   . Diabetes mellitus without complication ([place])   . Diverticulitis   . Fibromyalgia   . GERD (gastroesophageal reflux disease)   . Gout   . Hypercholesteremia   . Hypertension   . Pneumonia   . Pneumonia     Outpatient Medications Prior to Visit  Medication Sig Dispense Refill  . amLODipine (NORVASC) 10 MG tablet Take 10 mg by mouth daily.    . Cholecalciferol (VITAMIN D3) 125 MCG (5000 UT) CAPS Take 10,000 Units by mouth at bedtime.    . fexofenadine (ALLEGRA) 180 MG tablet Take 180 mg by mouth daily.    . fluticasone (FLONASE) 50 MCG/ACT nasal spray Place 1 spray into both nostrils daily.    . Fluticasone-Salmeterol (WIXELA INHUB) 250-50 MCG/DOSE AEPB Inhale 1 puff into the lungs 2 (two) times daily.    Marland Kitchen ipratropium (ATROVENT) 0.06 % nasal spray Place 2  sprays into both nostrils daily as needed for rhinitis.    Marland Kitchen lidocaine (LIDODERM) 5 % Place 1 patch onto the skin daily. Remove & Discard patch within 12 hours or as directed by MD 30 patch 0  . metFORMIN (GLUCOPHAGE-XR) 750 MG 24 hr tablet Take 750 mg by mouth every evening.    . montelukast (SINGULAIR) 10 MG tablet Take 10 mg by mouth daily.    Marland Kitchen omeprazole (PRILOSEC) 20 MG capsule Take 20 mg by mouth daily.    Marland Kitchen PROAIR HFA 108 (90 Base) MCG/ACT inhaler Inhale 2 puffs into the lungs every 4 (four) hours as needed for wheezing.   9  . naproxen (NAPROSYN) 500 MG tablet Take 1 tablet (500 mg total)  by mouth 2 (two) times daily with a meal. 30 tablet 0      Objective:     BP 126/80 (BP Location: Left Arm, Cuff Size: Normal)   Pulse 96   Ht 5' (1.524 m)   Wt 227 lb (103 kg)   SpO2 97%   BMI 44.33 kg/m   SpO2: 97 %  RA    Obese pleasant bf nad   HEENT: nl dentition, turbinates bilaterally, and oropharynx. Nl external ear canals without cough reflex   NECK :  without JVD/Nodes/TM/ nl carotid upstrokes bilaterally   LUNGS: no acc muscle use,  Nl contour chest which is clear to A and P bilaterally without cough on insp or exp maneuvers   CV:  RRR  no s3 or murmur or increase in P2, and no edema   ABD:  Quite obese but soft and nontender with nl inspiratory excursion in the supine position. No bruits or organomegaly appreciated, bowel sounds nl  MS:  Nl gait/ ext warm without deformities, calf tenderness, cyanosis or clubbing No obvious joint restrictions   SKIN: warm and dry without lesions    NEURO:  alert, approp, nl sensorium with  no motor or cerebellar deficits apparent.     CXR PA and Lateral:   04/26/2018 :    I personally reviewed images and agree with radiology impression as follows:    No active cardiopulmonary disease. My impression:  Prev cxr's show no convincing as dz but likely more atx changes /poor inspiration that are resolved on today's study        Assessment   Extrinsic asthma without complication 66/06/9474  After extensive coaching inhaler device,  effectiveness =    75% (short ti) > try dulera 100 up to 2bid    DDX of  difficult airways management almost all start with A and  include Adherence, Ace Inhibitors, Acid Reflux, Active Sinus Disease, Alpha 1 Antitripsin deficiency, Anxiety masquerading as Airways dz,  ABPA,  Allergy(esp in young), Aspiration (esp in elderly), Adverse effects of meds,  Active smoking or vaping, A bunch of PE's (a small clot burden can't cause this syndrome unless there is already severe underlying pulm or vascular  dz with poor reserve) plus two Bs  = Bronchiectasis and Beta blocker use..and one C= CHF  Adherence is always the initial "prime suspect" and is a multilayered concern that requires a "trust but verify" approach in every patient - starting with knowing how to use medications, especially inhalers, correctly, keeping up with refills and understanding the fundamental difference between maintenance and prns vs those medications only taken for a very short course and then stopped and not refilled.  - see hfa teaching - return with all meds in hand using a trust  but verify approach to confirm accurate Medication  Reconciliation The principal here is that until we are certain that the  patients are doing what we've asked, it makes no sense to ask them to do more.   ? Acid (or non-acid) GERD > always difficult to exclude as up to 75% of pts in some series report no assoc GI/ Heartburn symptoms> rec max (24h)  acid suppression (esp with flares of cough)  and diet restrictions/ reviewed and instructions given in writing.   ? Adverse effects of DPI >  The way she uses advair "when she gets sick" may actually work against her due to slow onset of action and large upper airway distribution/ side effects that then mimic poor asthma control.  ? Active sinus dz / rhinitis > continue flonase and prn allegra, not sure singulair is doing much here and can probably leave off for now and just use the KIS principle if possible  ? Allergy >  Typical trigger appears to be URI pattern but need to keep in ddx.   Based on two studies from NEJM  378; 20 p 1865 (2018) and 380 : p2020-30 (2019) in pts with mild asthma it is reasonable to use low dose symbicort eg 80 (and by extrapolation dulera 100) 2bid "prn" flare in this setting but I emphasized this was only shown with symbicort and takes advantage of the rapid onset of action but is not the same as "rescue therapy" but can be stopped once the acute symptoms have resolved and  the need for rescue has been minimized (< 2 x weekly)     ? Chf/ cardiac asthma needs to be keep in ddx given severity of CM on cxr but no evidence of this on today's  H and P      Morbid obesity with BMI of 40.0-44.9, adult (HCC) Body mass index is 44.33 kg/m.  -    No results found for: TSH   Contributing to gerd risk/ doe/reviewed the need and the process to achieve and maintain neg calorie balance > defer f/u primary care including intermittently monitoring thyroid status       Total time devoted to counseling  > 50 % of initial 60 min office visit:  review case with pt/ discussion of options/alternatives/ hfa training (see above) personally creating written customized instructions  in presence of pt  then going over those specific  Instructions directly with the pt including how to use all of the meds but in particular covering each new medication in detail and the difference between the maintenance= "automatic" meds and the prns using an action plan format for the latter (If this problem/symptom => do that organization reading Left to right).  Please see AVS from this visit for a full list of these instructions which I personally wrote for this pt and  are unique to this visit.       Christinia Gully, MD 04/26/2018

## 2018-04-26 NOTE — Patient Instructions (Addendum)
Stop singulair for now  Plan A = Automatic = when not 100% > Dulera(blue) 2 pffs every 12 hours for at least a week then taper off  Work on inhaler technique:  relax and gently blow all the way out then take a nice smooth deep breath back in, triggering the inhaler at same time you start breathing in.  Hold for up to 5 seconds if you can. Blow out thru nose. Rinse and gargle with water when done      Plan B = Backup Only use your albuterol inhaler(Proair/RED)  as a rescue medication to be used if you can't catch your breath by resting or doing a relaxed purse lip breathing pattern.  - The less you use it, the better it will work when you need it. - Ok to use the inhaler up to 2 puffs  every 4 hours if you must but call for appointment if use goes up over your usual need - Don't leave home without it !!  (think of it like the spare tire for your car)   Plan C = Crisis - only use your albuterol nebulizer if you first try Plan B and it fails to help > ok to use the nebulizer up to every 4 hours but if start needing it regularly call for immediate appointment   Plan D = Doctor - call me if B and C not adequate  Plan E = ER - go to ER or call 911 if all else fails    Whenever coughing for any reason > prilosec (omeprazole) Take 30- 60 min before your first and last meals of the day   GERD (REFLUX)  is an extremely common cause of respiratory symptoms just like yours , many times with no obvious heartburn at all.    It can be treated with medication, but also with lifestyle changes including elevation of the head of your bed (ideally with 6 inch  bed blocks),  Smoking cessation, avoidance of late meals, excessive alcohol, and avoid fatty foods, chocolate, peppermint, colas, red wine, and acidic juices such as orange juice.  NO MINT OR MENTHOL PRODUCTS SO NO COUGH DROPS  USE SUGARLESS CANDY INSTEAD (Jolley ranchers or Stover's or Life Savers) or even ice chips will also do - the key is to  swallow to prevent all throat clearing. NO OIL BASED VITAMINS - use powdered substitutes.     Please remember to go to the  x-ray department at the [facility] building (directly across from [facility])  @ [place]  in the basement  for your tests - we will call you with the results when they are available    Please schedule a follow up office visit in 4 weeks, sooner if needed  with all medications /inhalers/ solutions in hand so we can verify exactly what you are taking. This includes all medications from all doctors and over the counters

## 2018-04-27 ENCOUNTER — Encounter: Payer: Self-pay | Admitting: Internal Medicine

## 2018-04-27 NOTE — Assessment & Plan Note (Signed)
Body mass index is 44.33 kg/m.  -    No results found for: TSH   Contributing to gerd risk/ doe/reviewed the need and the process to achieve and maintain neg calorie balance > defer f/u primary care including intermittently monitoring thyroid status       Total time devoted to counseling  > 50 % of initial 60 min office visit:  review case with pt/ discussion of options/alternatives/ hfa training (see above) personally creating written customized instructions  in presence of pt  then going over those specific  Instructions directly with the pt including how to use all of the meds but in particular covering each new medication in detail and the difference between the maintenance= "automatic" meds and the prns using an action plan format for the latter (If this problem/symptom => do that organization reading Left to right).  Please see AVS from this visit for a full list of these instructions which I personally wrote for this pt and  are unique to this visit.

## 2018-04-27 NOTE — Progress Notes (Signed)
Called and left detailed msg with results ok per DPR

## 2018-04-27 NOTE — Assessment & Plan Note (Addendum)
04/26/2018  After extensive coaching inhaler device,  effectiveness =    75% (short ti) > try dulera 100 up to 2bid    DDX of  difficult airways management almost all start with A and  include Adherence, Ace Inhibitors, Acid Reflux, Active Sinus Disease, Alpha 1 Antitripsin deficiency, Anxiety masquerading as Airways dz,  ABPA,  Allergy(esp in young), Aspiration (esp in elderly), Adverse effects of meds,  Active smoking or vaping, A bunch of PE's (a small clot burden can't cause this syndrome unless there is already severe underlying pulm or vascular dz with poor reserve) plus two Bs  = Bronchiectasis and Beta blocker use..and one C= CHF  Adherence is always the initial "prime suspect" and is a multilayered concern that requires a "trust but verify" approach in every patient - starting with knowing how to use medications, especially inhalers, correctly, keeping up with refills and understanding the fundamental difference between maintenance and prns vs those medications only taken for a very short course and then stopped and not refilled.  - see hfa teaching - return with all meds in hand using a trust but verify approach to confirm accurate Medication  Reconciliation The principal here is that until we are certain that the  patients are doing what we've asked, it makes no sense to ask them to do more.   ? Acid (or non-acid) GERD > always difficult to exclude as up to 75% of pts in some series report no assoc GI/ Heartburn symptoms> rec max (24h)  acid suppression and diet restrictions/ reviewed and instructions given in writing.   ? Adverse effects of DPI >  The way she uses advair "when she gets sick" may actually work against her due to slow onset of action and large upper airway distribution/ side effects that then mimic poor asthma control.  ? Active sinus dz / rhinitis > continue flonase and prn allegra, not sure singulair is doing much here and can probably leave off for now and just use the KIS  principle if possible  ? Allergy >  Typical trigger appears to be URI pattern but need to keep in ddx.   Based on two studies from NEJM  378; 20 p 1865 (2018) and 380 : p2020-30 (2019) in pts with mild asthma it is reasonable to use low dose symbicort eg 80 (and by extrapolation dulera 100) 2bid "prn" flare in this setting but I emphasized this was only shown with symbicort and takes advantage of the rapid onset of action but is not the same as "rescue therapy" but can be stopped once the acute symptoms have resolved and the need for rescue has been minimized (< 2 x weekly)     ? Chf/ cardiac asthma needs to be keep in ddx given severity of CM on cxr but no evidence of this on today's  H and P

## 2018-04-29 ENCOUNTER — Ambulatory Visit (AMBULATORY_SURGERY_CENTER): Payer: BC Managed Care – PPO | Admitting: Gastroenterology

## 2018-04-29 ENCOUNTER — Encounter: Payer: Self-pay | Admitting: Gastroenterology

## 2018-04-29 VITALS — BP 140/79 | HR 80 | Temp 98.0°F | Resp 20

## 2018-04-29 DIAGNOSIS — D125 Benign neoplasm of sigmoid colon: Secondary | ICD-10-CM

## 2018-04-29 DIAGNOSIS — K5730 Diverticulosis of large intestine without perforation or abscess without bleeding: Secondary | ICD-10-CM | POA: Diagnosis not present

## 2018-04-29 DIAGNOSIS — K625 Hemorrhage of anus and rectum: Secondary | ICD-10-CM

## 2018-04-29 DIAGNOSIS — K648 Other hemorrhoids: Secondary | ICD-10-CM

## 2018-04-29 DIAGNOSIS — K635 Polyp of colon: Secondary | ICD-10-CM | POA: Diagnosis present

## 2018-04-29 MED ORDER — SODIUM CHLORIDE 0.9 % IV SOLN: 500.0000 mL | Freq: Once | INTRAVENOUS | Status: DC

## 2018-04-29 NOTE — Progress Notes (Signed)
Called to room to assist during endoscopic procedure.  Patient ID and intended procedure confirmed with present staff. Received instructions for my participation in the procedure from the performing physician.  

## 2018-04-29 NOTE — Patient Instructions (Signed)
**   Handouts given on polyps, diverticulosis, and hemorrhoids **   YOU HAD AN ENDOSCOPIC PROCEDURE TODAY AT THE [facility]:   Refer to the procedure report that was given to you for any specific questions about what was found during the examination.  If the procedure report does not answer your questions, please call your gastroenterologist to clarify.  If you requested that your care partner not be given the details of your procedure findings, then the procedure report has been included in a sealed envelope for you to review at your convenience later.  YOU SHOULD EXPECT: Some feelings of bloating in the abdomen. Passage of more gas than usual.  Walking can help get rid of the air that was put into your GI tract during the procedure and reduce the bloating. If you had a lower endoscopy (such as a colonoscopy or flexible sigmoidoscopy) you may notice spotting of blood in your stool or on the toilet paper. If you underwent a bowel prep for your procedure, you may not have a normal bowel movement for a few days.  Please Note:  You might notice some irritation and congestion in your nose or some drainage.  This is from the oxygen used during your procedure.  There is no need for concern and it should clear up in a day or so.  SYMPTOMS TO REPORT IMMEDIATELY:   Following lower endoscopy (colonoscopy or flexible sigmoidoscopy):  Excessive amounts of blood in the stool  Significant tenderness or worsening of abdominal pains  Swelling of the abdomen that is new, acute  Fever of 100F or higher  For urgent or emergent issues, a gastroenterologist can be reached at any hour by calling [phone].   DIET:  We do recommend a small meal at first, but then you may proceed to your regular diet.  Drink plenty of fluids but you should avoid alcoholic beverages for 24 hours.  ACTIVITY:  You should plan to take it easy for the rest of today and you should NOT DRIVE or use heavy machinery until  tomorrow (because of the sedation medicines used during the test).    FOLLOW UP: Our staff will call the number listed on your records the next business day following your procedure to check on you and address any questions or concerns that you may have regarding the information given to you following your procedure. If we do not reach you, we will leave a message.  However, if you are feeling well and you are not experiencing any problems, there is no need to return our call.  We will assume that you have returned to your regular daily activities without incident.  If any biopsies were taken you will be contacted by phone or by letter within the next 1-3 weeks.  Please call us at [phone] if you have not heard about the biopsies in 3 weeks.    SIGNATURES/CONFIDENTIALITY: You and/or your care partner have signed paperwork which will be entered into your electronic medical record.  These signatures attest to the fact that that the information above on your After Visit Summary has been reviewed and is understood.  Full responsibility of the confidentiality of this discharge information lies with you and/or your care-partner. 

## 2018-04-29 NOTE — Progress Notes (Signed)
Pt's states no medical or surgical changes since previsit or office visit. 

## 2018-04-29 NOTE — Progress Notes (Signed)
Report given to PACU, vss 

## 2018-04-29 NOTE — Op Note (Addendum)
[place] Patient Name: Beth Macias Procedure Date: 04/29/2018 9:12 AM MRN: [id number] Endoscopist: Mauri Pole , MD Age: 67 Referring MD:  Date of Birth: July 22, 1950 Gender: Female Account #: [id number] Procedure:                Colonoscopy Indications:              Evaluation of unexplained GI bleeding Medicines:                Monitored Anesthesia Care Procedure:                Pre-Anesthesia Assessment:                           - Prior to the procedure, a History and Physical                            was performed, and patient medications and                            allergies were reviewed. The patient's tolerance of                            previous anesthesia was also reviewed. The risks                            and benefits of the procedure and the sedation                            options and risks were discussed with the patient.                            All questions were answered, and informed consent                            was obtained. Prior Anticoagulants: The patient has                            taken no previous anticoagulant or antiplatelet                            agents. ASA Grade Assessment: II - A patient with                            mild systemic disease. After reviewing the risks                            and benefits, the patient was deemed in                            satisfactory condition to undergo the procedure.                           After obtaining informed consent, the colonoscope  was passed under direct vision. Throughout the                            procedure, the patient's blood pressure, pulse, and                            oxygen saturations were monitored continuously. The                            Colonoscope was introduced through the anus and                            advanced to the the cecum, identified by                            appendiceal orifice and  ileocecal valve. The                            colonoscopy was performed without difficulty. The                            patient tolerated the procedure well. The quality                            of the bowel preparation was good. The ileocecal                            valve, appendiceal orifice, and rectum were                            photographed. Scope In: 9:19:19 AM Scope Out: 9:33:46 AM Scope Withdrawal Time: 0 hours 11 minutes 38 seconds  Total Procedure Duration: 0 hours 14 minutes 27 seconds  Findings:                 The perianal and digital rectal examinations were                            normal.                           A 2 mm polyp was found in the sigmoid colon. The                            polyp was sessile. The polyp was removed with a                            cold biopsy forceps. Resection and retrieval were                            complete.                           Scattered small-mouthed diverticula were found in  the sigmoid colon, descending colon, transverse                            colon and ascending colon.                           Non-bleeding internal hemorrhoids were found during                            retroflexion. The hemorrhoids were medium-sized. Complications:            No immediate complications. Estimated Blood Loss:     Estimated blood loss was minimal. Impression:               - One 2 mm polyp in the sigmoid colon, removed with                            a cold biopsy forceps. Resected and retrieved.                           - Diverticulosis in the sigmoid colon, in the                            descending colon, in the transverse colon and in                            the ascending colon.                           - Non-bleeding internal hemorrhoids. Recommendation:           - Patient has a contact number available for                            emergencies. The signs and symptoms of  potential                            delayed complications were discussed with the                            patient. Return to normal activities tomorrow.                            Written discharge instructions were provided to the                            patient.                           - Resume previous diet.                           - Continue present medications.                           - Await pathology results.                           -  Repeat colonoscopy in 5-10 years for surveillance                            based on pathology results.                           - Return to GI clinic at the next available                            appointment for hemorrhoidal band ligation for                            symptomatic hemorrhoids. Mauri Pole, MD 04/29/2018 9:39:05 AM This report has been signed electronically.

## 2018-04-30 ENCOUNTER — Telehealth: Payer: Self-pay | Admitting: *Deleted

## 2018-04-30 NOTE — Telephone Encounter (Signed)
  Follow up Call-  Call back number 04/29/2018  Post procedure Call Back phone  # [phone]  Permission to leave phone message Yes     Patient questions:  Do you have a fever, pain , or abdominal swelling? No. Pain Score  0 *  Have you tolerated food without any problems? Yes  Have you been able to return to your normal activities? Yes.    Do you have any questions about your discharge instructions: Diet   No. Medications  No. Follow up visit  No.  Do you have questions or concerns about your Care? No.  Actions: * If pain score is 4 or above: No action needed, pain <4.

## 2018-05-04 ENCOUNTER — Other Ambulatory Visit: Payer: Self-pay | Admitting: Orthopaedic Surgery

## 2018-05-04 DIAGNOSIS — M75102 Unspecified rotator cuff tear or rupture of left shoulder, not specified as traumatic: Secondary | ICD-10-CM

## 2018-05-10 ENCOUNTER — Encounter: Payer: Self-pay | Admitting: Gastroenterology

## 2018-05-17 ENCOUNTER — Ambulatory Visit
Admission: RE | Admit: 2018-05-17 | Discharge: 2018-05-17 | Disposition: A | Discharge status: Home / Self Care | Payer: BC Managed Care – PPO | Source: Ambulatory Visit | Attending: Orthopaedic Surgery | Admitting: Orthopaedic Surgery

## 2018-05-17 ENCOUNTER — Other Ambulatory Visit: Payer: BC Managed Care – PPO

## 2018-05-17 DIAGNOSIS — M75102 Unspecified rotator cuff tear or rupture of left shoulder, not specified as traumatic: Secondary | ICD-10-CM

## 2018-05-25 ENCOUNTER — Ambulatory Visit (INDEPENDENT_AMBULATORY_CARE_PROVIDER_SITE_OTHER): Payer: BC Managed Care – PPO | Admitting: Internal Medicine

## 2018-05-25 ENCOUNTER — Encounter: Payer: Self-pay | Admitting: Internal Medicine

## 2018-05-25 DIAGNOSIS — J4530 Mild persistent asthma, uncomplicated: Secondary | ICD-10-CM | POA: Diagnosis not present

## 2018-05-25 DIAGNOSIS — Z6841 Body Mass Index (BMI) 40.0 and over, adult: Secondary | ICD-10-CM | POA: Diagnosis not present

## 2018-05-25 NOTE — Progress Notes (Signed)
Beth Macias, female    DOB: 12-15-1950,    MRN: [id number]   Brief patient profile:  24 yowf never regular smoker quit completely in 1978 with h/o asthma/atopy "all her life"  with baseline on "maint allegra" with no need for any inhalers or singulair then acutely worse Oct 26th 2019 with a head cold to chest with cough, sore throat, chest /nasal congestion and green sputum >  PCP  Nebulizer rx started advair, cough med, singulair > went to [facility] when no better >>  added abx for possible sinus/UTI  infection>  [facility]  04/07/18 ? Lingular pna rx Keflex / zpak and  referred to pulmonary clinic 04/26/2018 by Dr   Lennette Bihari Via     Brief patient profile:  04/26/2018  Pulmonary/ 1st office eval/Beth Macias  Chief Complaint  Patient presents with  . Pulmonary Consult    Referred by Dr. Lennette Bihari Via. She c/o  dry cough and hoarseness.  She states she had PNA approx 3 wks ago.   Dyspnea:  Legs stop before breathing usually   Cough: dry worse at hs then gets to sleep fine  Sleep: on side 2 pillows  SABA use: several days ago rec Stop singulair for now Plan A = Automatic = when not 100% > Dulera(blue) 2 pffs every 12 hours for at least a week then taper off Work on inhaler technique:  relax and gently blow all the way out then take a nice smooth deep breath back in, triggering the inhaler at same time you start breathing in.  Hold for up to 5 seconds if you can. Blow out thru nose. Rinse and gargle with water when done Plan B = Backup Only use your albuterol inhaler(Proair/RED)   Plan C = Crisis - only use your albuterol nebulizer if you first try Plan B and it fails to help > ok to use the nebulizer up to every 4 hours but if start needing it regularly call for immediate appointment Whenever coughing for any reason > prilosec (omeprazole) Take 30- 60 min before your first and last meals of the day  GERD  rx   Please schedule a follow up office visit in 4 weeks, sooner if needed  with all medications  /inhalers/ solutions in hand so we can verify exactly what you are taking. This includes all medications from all doctors and over the counters    05/25/2018  f/u ov/Beth Macias re: mild asthma tapered off dulera / did not bring meds as req Chief Complaint  Patient presents with  . Follow-up    Breathing is doing well.  She has used her proair several times in the past wk.    Dyspnea:  Not limited by breathing from desired activities but avoids "overdoing it" due to wt issues   Cough: gone now Sleeping: wedge 30 degrees  SABA use: none now   No obvious day to day or daytime variability or assoc excess/ purulent sputum or mucus plugs or hemoptysis or cp or chest tightness, subjective wheeze or overt sinus or hb symptoms.   Sleeping as above without nocturnal  or early am exacerbation  of respiratory  c/o's or need for noct saba. Also denies any obvious fluctuation of symptoms with weather or environmental changes or other aggravating or alleviating factors except as outlined above   No unusual exposure hx or h/o childhood pna  or knowledge of premature birth.  Current Allergies, Complete Past Medical History, Past Surgical History, Family History, and Social History  were reviewed in [facility] record.  ROS  The following are not active complaints unless bolded Hoarseness, sore throat, dysphagia, dental problems, itching, sneezing,  nasal congestion or discharge of excess mucus or purulent secretions, ear ache,   fever, chills, sweats, unintended wt loss or wt gain, classically pleuritic or exertional cp,  orthopnea pnd or arm/hand swelling  or leg swelling, presyncope, palpitations, abdominal pain, anorexia, nausea, vomiting, diarrhea  or change in bowel habits or change in bladder habits, change in stools or change in urine, dysuria, hematuria,  rash, arthralgias, visual complaints, headache, numbness, weakness or ataxia or problems with walking or coordination,  change in  mood or  memory.        Current Meds  Medication Sig  . amLODipine (NORVASC) 10 MG tablet Take 10 mg by mouth daily.  . Cholecalciferol (VITAMIN D3) 125 MCG (5000 UT) CAPS Take 10,000 Units by mouth at bedtime.  . fexofenadine (ALLEGRA) 180 MG tablet Take 180 mg by mouth daily.  Marland Kitchen lidocaine (LIDODERM) 5 % Place 1 patch onto the skin daily. Remove & Discard patch within 12 hours or as directed by MD  . metFORMIN (GLUCOPHAGE-XR) 750 MG 24 hr tablet Take 750 mg by mouth every evening.  . mometasone-formoterol (DULERA) 100-5 MCG/ACT AERO Inhale 2 puffs into the lungs 2 (two) times daily.  Marland Kitchen omeprazole (PRILOSEC) 20 MG capsule Take 20 mg by mouth daily.  Marland Kitchen PROAIR HFA 108 (90 Base) MCG/ACT inhaler Inhale 2 puffs into the lungs every 4 (four) hours as needed for wheezing.                     Objective:     Wt Readings from Last 3 Encounters:  05/25/18 231 lb (104.8 kg)  04/26/18 227 lb (103 kg)  04/14/18 224 lb 2 oz (101.7 kg)    Animated amb obese bf nad all smiles  Vital signs reviewed - Note on arrival 02 sats  98% on RA        HEENT: nl dentition, turbinates bilaterally, and oropharynx. Nl external ear canals without cough reflex   NECK :  without JVD/Nodes/TM/ nl carotid upstrokes bilaterally   LUNGS: no acc muscle use,  Nl contour chest which is clear to A and P bilaterally without cough on insp or exp maneuvers   CV:  RRR  no s3 or murmur or increase in P2, and no edema   ABD:  Obese/ soft and nontender with nl inspiratory excursion in the supine position. No bruits or organomegaly appreciated, bowel sounds nl  MS:  Nl gait/ ext warm without deformities, calf tenderness, cyanosis or clubbing No obvious joint restrictions   SKIN: warm and dry without lesions    NEURO:  alert, approp, nl sensorium with  no motor or cerebellar deficits apparent.          Assessment

## 2018-05-25 NOTE — Patient Instructions (Signed)
No change recommendations  Work on inhaler technique:  relax and gently blow all the way out then take a nice smooth deep breath back in, triggering the inhaler at same time you start breathing in.  Hold for up to 5 seconds if you can. Blow out thru nose. Rinse and gargle with water when done      Please schedule a follow up visit in 3 months but call sooner if needed

## 2018-05-26 ENCOUNTER — Encounter: Payer: Self-pay | Admitting: Internal Medicine

## 2018-05-26 NOTE — Assessment & Plan Note (Signed)
Body mass index is 45.11 kg/m.  -  trending up still  No results found for: TSH    Contributing to gerd risk/ doe/reviewed the need and the process to achieve and maintain neg calorie balance > defer f/u primary care including intermittently monitoring thyroid status

## 2018-05-26 NOTE — Assessment & Plan Note (Addendum)
04/26/2018   try dulera 100 up to 2bid and taper off if doing great  - 05/25/2018  After extensive coaching inhaler device,  effectiveness =    75% baseline, 90% with coaching   All goals of chronic asthma control met including optimal function and elimination of symptoms with minimal need for rescue therapy.  Contingencies discussed in full including contacting this office immediately if not controlling the symptoms using the rule of two's.      dulera 100 or symb 80 can be used for flares in this setting Based on two studies from NEJM  378; 20 p 1865 (2018) and 380 : p2020-30 (2019) in pts with mild asthma it is reasonable to use low dose symbicort eg 80 2bid "prn" flare in this setting but I emphasized this was only shown with symbicort and takes advantage of the rapid onset of action but is not the same as "rescue therapy" but can be stopped once the acute symptoms have resolved and the need for rescue has been minimized (< 2 x weekly)    Also reviewed: Formulary restrictions will be an ongoing challenge for the forseable future and I would be happy to pick an alternative if the pt will first  provide me a list of them but pt  will need to return here for training for any new device that is required eg dpi vs hfa vs respimat.    In meantime we can always provide samples so the patient never runs out of any needed respiratory medications.    F/u can be q 3 m, sooner prn    I had an extended discussion with the patient reviewing all relevant studies completed to date and  lasting 15 to 20 minutes of a 25 minute visit    See device teaching which extended face to face time for this visit.  Each maintenance medication was reviewed in detail including emphasizing most importantly the difference between maintenance and prns and under what circumstances the prns are to be triggered using an action plan format that is not reflected in the computer generated alphabetically organized AVS which I have  not found useful in most complex patients, especially with respiratory illnesses  Please see AVS for specific instructions unique to this visit that I personally wrote and verbalized to the the pt in detail and then reviewed with pt  by my nurse highlighting any  changes in therapy recommended at today's visit to their plan of care.

## 2018-06-01 ENCOUNTER — Ambulatory Visit: Payer: BC Managed Care – PPO | Admitting: Gastroenterology

## 2018-06-01 ENCOUNTER — Encounter: Payer: Self-pay | Admitting: Gastroenterology

## 2018-06-01 VITALS — BP 128/70 | HR 78 | Ht 60.0 in | Wt 231.6 lb

## 2018-06-01 DIAGNOSIS — K641 Second degree hemorrhoids: Secondary | ICD-10-CM | POA: Diagnosis not present

## 2018-06-01 NOTE — Progress Notes (Signed)
PROCEDURE NOTE: The patient presents with symptomatic grade II hemorrhoids, requesting rubber band ligation of his/her hemorrhoidal disease.  All risks, benefits and alternative forms of therapy were described and informed consent was obtained.  In the Left Lateral Decubitus position anoscopic examination revealed grade II hemorrhoids in the right posterior, left lateral and right anterior position(s).  The anorectum was pre-medicated with 0.125% nitroglycerin and RectiCare. The decision was made to band the left lateral internal hemorrhoid, and the Elizabeth was used to perform band ligation without complication.  Digital anorectal examination was then performed to assure proper positioning of the band, and to adjust the banded tissue as required.  The patient was discharged home without pain or other issues.  Dietary and behavioral recommendations were given and along with follow-up instructions.     The following adjunctive treatments were recommended: Benefiber 1 teaspoon 3 times daily with meals  The patient will return in 2 to 4 weeks for  follow-up and possible additional banding as required. No complications were encountered and the patient tolerated the procedure well.  Damaris Hippo , MD [phone]

## 2018-06-01 NOTE — Patient Instructions (Signed)

## 2018-06-02 ENCOUNTER — Encounter: Payer: Self-pay | Admitting: Gastroenterology

## 2018-06-14 NOTE — Progress Notes (Signed)
Cardiology Office Note   Date:  06/17/2018   ID:  Beth Macias, DOB Mar 15, 1951, MRN [id number]  PCP:  Dineen Kid, MD  Cardiologist:   Jenkins Rouge, MD   No chief complaint on file.     History of Present Illness: Beth Macias is a 68 y.o. female who presents for consultation regarding chest pain. Referred by Lennette Bihari [facility] and [facility] Reviewed ER visit notes from 04/07/18  Lots of URI, congestive symptoms  And urinary frequency. Chest felt tight after a few days. Previously diagnosed with bone spur on left shoulder pain  Radiated from here down left arm Describes pain as electric shock occurs spontaneously , non exertional and last Seconds to minutes then recurs Started on Bactrim  CRF;s include DM, HLD and HTN. Morbidly obese with asthma BP was elevated in ER Had abnormal ECG with no old one to compare  Reviewed ECG from 04/07/18 SR PVC isolated LAD and poor R wave progression no acute ST changes Troponin negative x 2 CXR with CE ligular airspace disease could Be scarring , atelectasis or early pneumonia. F/U CXR done 04/26/18 NAD. MRI of shoulder ordered but not done yet  She has 3 daughters One is a PA, one a physician and on does HR. She is originally from [place] now Sedentary Activity limited by dyspnea and sciatica    Past Medical History:  Diagnosis Date  . Asthma   . Diabetes mellitus without complication ([place])   . Diverticulitis   . Fibromyalgia   . GERD (gastroesophageal reflux disease)   . Gout   . Hypercholesteremia   . Hypertension   . Pneumonia   . Pneumonia     Past Surgical History:  Procedure Laterality Date  . APPENDECTOMY    . CESAREAN SECTION     x 3  . LIPOMA EXCISION    . MENISCUS REPAIR Left   . OVARIAN CYST REMOVAL       Current Outpatient Medications  Medication Sig Dispense Refill  . amLODipine (NORVASC) 10 MG tablet Take 10 mg by mouth daily.    . cetirizine (ZYRTEC) 10 MG tablet  Take 10 mg by mouth daily.    . Cholecalciferol (VITAMIN D3) 125 MCG (5000 UT) CAPS Take 10,000 Units by mouth at bedtime.    . metFORMIN (GLUCOPHAGE-XR) 750 MG 24 hr tablet Take 750 mg by mouth every evening.    . mometasone-formoterol (DULERA) 100-5 MCG/ACT AERO Inhale 2 puffs into the lungs 2 (two) times daily. 1 Inhaler 11  . omeprazole (PRILOSEC) 20 MG capsule Take 20 mg by mouth daily.    Marland Kitchen PROAIR HFA 108 (90 Base) MCG/ACT inhaler Inhale 2 puffs into the lungs every 4 (four) hours as needed for wheezing.   9   No current facility-administered medications for this visit.     Allergies:   Penicillins; Tape; and Gabapentin    Social History:  The patient  reports that she quit smoking about 42 years ago. Her smoking use included cigarettes. She has a 2.25 pack-year smoking history. She has never used smokeless tobacco. She reports that she does not drink alcohol or use drugs.   Family History:  The patient's family history includes AAA (abdominal aortic aneurysm) in her father; Colon cancer in her maternal uncle; Crohn's disease in her paternal aunt; Diabetes in her mother; Gout in her brother and mother; Heart attack in her brother; Heart failure in her father and mother; Hypertension in  her brother, father, and mother.    ROS:  Please see the history of present illness.   Otherwise, review of systems are positive for none.   All other systems are reviewed and negative.    PHYSICAL EXAM: VS:  BP 128/70   Pulse 92   Ht 5' (1.524 m)   Wt 226 lb 1.9 oz (102.6 kg)   SpO2 93%   BMI 44.16 kg/m  , BMI Body mass index is 44.16 kg/m. Affect appropriate Healthy:  appears stated age 64: normal Neck supple with no adenopathy JVP normal no bruits no thyromegaly Lungs clear with no wheezing and good diaphragmatic motion Heart:  S1/S2 no murmur, no rub, gallop or click PMI normal Abdomen: benighn, BS positve, no tenderness, no AAA no bruit.  No HSM or HJR Distal pulses intact with no  bruits No edema Neuro non-focal Skin warm and dry No muscular weakness    EKG:  See HPI    Recent Labs: 04/07/2018: ALT 28; BUN 12; Creatinine, Ser 0.73; Hemoglobin 12.0; Platelets 308; Potassium 3.2; Sodium 141    Lipid Panel No results found for: CHOL, TRIG, HDL, CHOLHDL, VLDL, LDLCALC, LDLDIRECT    Wt Readings from Last 3 Encounters:  06/17/18 226 lb 1.9 oz (102.6 kg)  06/01/18 231 lb 9.6 oz (105.1 kg)  05/25/18 231 lb (104.8 kg)      Other studies Reviewed: Additional studies/ records that were reviewed today include: Notes from primary notes form ER labs ECG CXR x 2.    ASSESSMENT AND PLAN:  1.  Chest Pain:  Very atypical for cardiac disease. Abnormal ECG related to HTN and body habitus. Multiple CRF;s Will do f/u TTE given CE on CXR and Lexiscan myovue to r/o CAD.  2. HTN: Well controlled.  Continue current medications and low sodium Dash type diet.   3. HLD :  No lipids on file f/u primary  4. DM:  Discussed low carb diet.  Target hemoglobin A1c is 6.5 or less.  Continue current medications. 5. Ortho:  Encouraged her to f/u with MRI scan    Current medicines are reviewed at length with the patient today.  The patient does not have concerns regarding medicines.  The following changes have been made:  no change  Labs/ tests ordered today include: TTE, Lexiscan Myovue   Orders Placed This Encounter  Procedures  . MYOCARDIAL PERFUSION IMAGING  . EKG 12-Lead  . ECHOCARDIOGRAM COMPLETE     Disposition:   FU with cardiology in a year    Signed, Jenkins Rouge, MD  06/17/2018 10:10 AM    [address], [place], [place]  [address] Phone: [phone]; Fax: [phone]

## 2018-06-17 ENCOUNTER — Encounter: Payer: Self-pay | Admitting: Cardiovascular Disease

## 2018-06-17 ENCOUNTER — Ambulatory Visit: Payer: BC Managed Care – PPO | Admitting: Cardiovascular Disease

## 2018-06-17 VITALS — BP 128/70 | HR 92 | Ht 60.0 in | Wt 226.1 lb

## 2018-06-17 DIAGNOSIS — R079 Chest pain, unspecified: Secondary | ICD-10-CM | POA: Diagnosis not present

## 2018-06-17 DIAGNOSIS — R0600 Dyspnea, unspecified: Secondary | ICD-10-CM | POA: Diagnosis not present

## 2018-06-17 NOTE — Patient Instructions (Addendum)
Medication Instructions:   If you need a refill on your cardiac medications before your next appointment, please call your pharmacy.   Lab work:  If you have labs (blood work) drawn today and your tests are completely normal, you will receive your results only by: Marland Kitchen MyChart Message (if you have MyChart) OR . A paper copy in the mail If you have any lab test that is abnormal or we need to change your treatment, we will call you to review the results.  Testing/Procedures: Your physician has requested that you have an echocardiogram. Echocardiography is a painless test that uses sound waves to create images of your heart. It provides your doctor with information about the size and shape of your heart and how well your heart's chambers and valves are working. This procedure takes approximately one hour. There are no restrictions for this procedure.  Your physician has requested that you have a lexiscan myoview. For further information please visit [email or web address]. Please follow instruction sheet, as given.  Follow-Up: At [facility], you and your health needs are our priority.  As part of our continuing mission to provide you with exceptional heart care, we have created designated Provider Care Teams.  These Care Teams include your primary Cardiologist (physician) and Advanced Practice Providers (APPs -  Physician Assistants and Nurse Practitioners) who all work together to provide you with the care you need, when you need it. Your physician wants you to follow-up in: 1 year with Dr. Johnsie Cancel. You will receive a reminder letter in the mail two months in advance. If you don't receive a letter, please call our office to schedule the follow-up appointment.

## 2018-06-21 ENCOUNTER — Telehealth: Payer: Self-pay | Admitting: Internal Medicine

## 2018-06-21 NOTE — Telephone Encounter (Signed)
lmtcb for pt.  

## 2018-06-21 NOTE — Telephone Encounter (Signed)
ATC pt, no answer. Left message for pt to call back.    04/26/2018   try dulera 100 up to 2bid and taper off if doing great  - 05/25/2018  After extensive coaching inhaler device,  effectiveness =    75% baseline, 90% with coaching   All goals of chronic asthma control met including optimal function and elimination of symptoms with minimal need for rescue therapy.  Contingencies discussed in full including contacting this office immediately if not controlling the symptoms using the rule of two's.      dulera 100 or symb 80 can be used for flares in this setting Based on two studies from NEJM  378; 20 p 1865 (2018) and 380 : p2020-30 (2019) in pts with mild asthma it is reasonable to use low dose symbicort eg 80 2bid "prn" flare in this setting but I emphasized this was only shown with symbicort and takes advantage of the rapid onset of action but is not the same as "rescue therapy" but can be stopped once the acute symptoms have resolved and the need for rescue has been minimized (< 2 x weekly)     F/u can be q 3 m, sooner prn    I had an extended discussion with the patient reviewing all relevant studies completed to date and  lasting 15 to 20 minutes of a 25 minute visit    See device teaching which extended face to face time for this visit.  Each maintenance medication was reviewed in detail including emphasizing most importantly the difference between maintenance and prns and under what circumstances the prns are to be triggered using an action plan format that is not reflected in the computer generated alphabetically organized AVS which I have not found useful in most complex patients, especially with respiratory illnesses  Please see AVS for specific instructions unique to this visit that I personally wrote and verbalized to the the pt in detail and then reviewed with pt  by my nurse highlighting any  changes in therapy recommended at today's visit to their plan of care.

## 2018-06-21 NOTE — Telephone Encounter (Signed)
Pt is calling back [phone]

## 2018-06-22 MED ORDER — MOMETASONE FURO-FORMOTEROL FUM 100-5 MCG/ACT IN AERO: 2.0000 | INHALATION_SPRAY | Freq: Two times a day (BID) | RESPIRATORY_TRACT | 11 refills | Status: DC

## 2018-06-22 NOTE — Telephone Encounter (Signed)
Spoke with pt. She is requesting for [facility] to be sent in to her pharmacy. Rx has been sent. Nothing further was needed.

## 2018-06-28 ENCOUNTER — Encounter: Payer: BC Managed Care – PPO | Admitting: Gastroenterology

## 2018-06-30 ENCOUNTER — Ambulatory Visit: Payer: BC Managed Care – PPO | Admitting: Family Medicine

## 2018-06-30 ENCOUNTER — Telehealth: Payer: Self-pay | Admitting: Internal Medicine

## 2018-06-30 ENCOUNTER — Encounter: Payer: Self-pay | Admitting: Family Medicine

## 2018-06-30 VITALS — Temp 98.7°F | Ht 60.0 in | Wt 226.0 lb

## 2018-06-30 DIAGNOSIS — J3089 Other allergic rhinitis: Secondary | ICD-10-CM

## 2018-06-30 DIAGNOSIS — Z87440 Personal history of urinary (tract) infections: Secondary | ICD-10-CM | POA: Diagnosis not present

## 2018-06-30 DIAGNOSIS — I10 Essential (primary) hypertension: Secondary | ICD-10-CM

## 2018-06-30 DIAGNOSIS — K219 Gastro-esophageal reflux disease without esophagitis: Secondary | ICD-10-CM

## 2018-06-30 DIAGNOSIS — Z8739 Personal history of other diseases of the musculoskeletal system and connective tissue: Secondary | ICD-10-CM

## 2018-06-30 DIAGNOSIS — R7303 Prediabetes: Secondary | ICD-10-CM | POA: Diagnosis not present

## 2018-06-30 DIAGNOSIS — J4520 Mild intermittent asthma, uncomplicated: Secondary | ICD-10-CM | POA: Diagnosis not present

## 2018-06-30 DIAGNOSIS — R350 Frequency of micturition: Secondary | ICD-10-CM

## 2018-06-30 DIAGNOSIS — Z7689 Persons encountering health services in other specified circumstances: Secondary | ICD-10-CM

## 2018-06-30 NOTE — Telephone Encounter (Signed)
Called and spoke with Patient. She stated that she had called a few weeks ago, because her Beth Macias was not approved by her insurance, and she can not afford it. She stated that she still has Wixela with refills, and she is wanting to know if she can go back on that.    Dr Melvyn Novas, please advise

## 2018-06-30 NOTE — Progress Notes (Signed)
Patient presents to clinic today to establish care.  SUBJECTIVE: PMH: Pt is a 68 yo female with pmh sig for HTN, asthma, GERD, prediabetes, h/o diverticulitis, seasonal allergies, frequent UTIs.  Pt previously seen by Dr. Lennette Bihari Via.  History of recurrent UTIs: -Patient feels like her symptoms started within the last year -She attributes her symptoms to steroid injections for sciatica -Continues to have urinary symptoms despite treatment -Endorses incomplete bladder emptying, nocturia. -Notes a trickle of urine when she urinates -Patient requesting referral to urology.  (pt's family member is a urology PA) -Denies fever, chills, nausea, vomiting.  Has some back pain at baseline.  Asthma: -Patient endorses adult onset asthma -Currently taking Wiexla (generic Advair) 250 mg as needed and albuterol as needed -Followed by pulmonology, Dr. Melvyn Novas  Prediabetes: -Currently taking metformin ER 750 mg in a.m. -Trying to eat better  GERD: -Taking omeprazole 80 mg daily -Tries to avoid foods known to cause symptoms -Followed by GI, Dr. Silverio Decamp  Environmental and seasonal allergies: -Endorses symptoms with dust mites, mold, very cold or very hot weather -Taking montelukast 10 mg daily and Zyrtec 10 mg daily   HTN: -Checking BP at home typically 120s-130s/80 -Recently lost 7 pounds.  Eating chicken, fish, and some pork -Taking Norvasc 10 mg -Trying to drink more water  History of gout: -Avoid foods known to cause symptoms -Notes severe symptoms with legumes -States seafood does not cause problems if it is fresh.  Allergies: Penicillin-itching Gabapentin-rash, itching, "crazy-confused"  Past surgical history: Left knee 2010-torn something Right hand and arm surgery 2015 Ovarian cyst removal 1980s Appendectomy 1968 C-section 1981, 83, 86  Social history: Patient is separated.  Patient has 3 adult children.  Pt states she lost a child while 7 months pregnant.  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  Pt endorses social alcohol use.  Pt denies current tobacco use.  States smoked in the past.  Patient denies drug use.  Health Maintenance: Dental --Dr. Herbie Baltimore L. [facility]-Dr. Laroy Apple Physiatrist at [facility] Cardiologist--Dr. Jenkins Rouge Pulmonologist-Dr. Christinia Gully Vision --diabetic eye exam September 2019, Dr. Joseph Art Immunizations --Pneumovax 2015, influenza vaccine 2019, TB test 2007, shingles vaccine 2014? Colonoscopy --2020 Mammogram --08/2017 PAP --2016 or 17   Past Medical History:  Diagnosis Date  . Asthma   . Diabetes mellitus without complication ([place])   . Diverticulitis   . Fibromyalgia   . GERD (gastroesophageal reflux disease)   . Gout   . Hypercholesteremia   . Hypertension   . Pneumonia   . Pneumonia     Past Surgical History:  Procedure Laterality Date  . APPENDECTOMY    . CESAREAN SECTION     x 3  . LIPOMA EXCISION    . MENISCUS REPAIR Left   . OVARIAN CYST REMOVAL      Current Outpatient Medications on File Prior to Visit  Medication Sig Dispense Refill  . Fluticasone-Salmeterol (WIXELA INHUB) 250-50 MCG/DOSE AEPB Inhale 1 puff into the lungs as needed.    Marland Kitchen amLODipine (NORVASC) 10 MG tablet Take 10 mg by mouth daily.    . cetirizine (ZYRTEC) 10 MG tablet Take 10 mg by mouth daily.    . Cholecalciferol (VITAMIN D3) 125 MCG (5000 UT) CAPS Take 10,000 Units by mouth at bedtime.    . metFORMIN (GLUCOPHAGE-XR) 750 MG 24 hr tablet Take 750 mg by mouth every evening.    . mometasone-formoterol (DULERA) 100-5 MCG/ACT AERO Inhale 2 puffs into  the lungs 2 (two) times daily. (Patient not taking: Reported on 06/30/2018) 1 Inhaler 11  . omeprazole (PRILOSEC) 20 MG capsule Take 20 mg by mouth daily.    Marland Kitchen PROAIR HFA 108 (90 Base) MCG/ACT inhaler Inhale 2 puffs into the lungs every 4 (four) hours as needed for wheezing.   9   No current facility-administered medications on file prior  to visit.     Allergies  Allergen Reactions  . Penicillins Itching    Has patient had a PCN reaction causing immediate rash, facial/tongue/throat swelling, SOB or lightheadedness with hypotension: No Has patient had a PCN reaction causing severe rash involving mucus membranes or skin necrosis: No Has patient had a PCN reaction that required hospitalization: Unknown Has patient had a PCN reaction occurring within the last 10 years: No If all of the above answers are "NO", then may proceed with Cephalosporin use.  . Tape Other (See Comments)    Blisters  . Gabapentin Itching, Rash and Other (See Comments)    Incoherent    Family History  Problem Relation Age of Onset  . Heart failure Mother   . Gout Mother   . Hypertension Mother   . Diabetes Mother   . Heart failure Father   . Hypertension Father   . AAA (abdominal aortic aneurysm) Father   . Gout Brother   . Hypertension Brother   . Heart attack Brother   . Colon cancer Maternal Uncle   . Crohn's disease Paternal Aunt     Social History   Socioeconomic History  . Marital status: Legally Separated    Spouse name: Not on file  . Number of children: 3  . Years of education: Not on file  . Highest education level: Not on file  Occupational History  . Occupation: Pharmacist, hospital  Social Needs  . Financial resource strain: Not on file  . Food insecurity:    Worry: Not on file    Inability: Not on file  . Transportation needs:    Medical: Not on file    Non-medical: Not on file  Tobacco Use  . Smoking status: Former Smoker    Packs/day: 0.25    Years: 9.00    Pack years: 2.25    Types: Cigarettes    Last attempt to quit: 05/26/1976    Years since quitting: 42.1  . Smokeless tobacco: Never Used  Substance and Sexual Activity  . Alcohol use: Never    Frequency: Never  . Drug use: Never  . Sexual activity: Not on file  Lifestyle  . Physical activity:    Days per week: Not on file    Minutes per session: Not on file  .  Stress: Not on file  Relationships  . Social connections:    Talks on phone: Not on file    Gets together: Not on file    Attends religious service: Not on file    Active member of club or organization: Not on file    Attends meetings of clubs or organizations: Not on file    Relationship status: Not on file  . Intimate partner violence:    Fear of current or ex partner: Not on file    Emotionally abused: Not on file    Physically abused: Not on file    Forced sexual activity: Not on file  Other Topics Concern  . Not on file  Social History Narrative  . Not on file    ROS General: Denies fever, chills, night sweats, changes  in weight, changes in appetite HEENT: Denies headaches, ear pain, changes in vision, rhinorrhea, sore throat CV: Denies CP, palpitations, SOB, orthopnea Pulm: Denies SOB, cough   +wheezing GI: Denies abdominal pain, nausea, vomiting, diarrhea, constipation  + GERD GU: Denies dysuria, hematuria, vaginal discharge  + urinary frequency, frequent UTIs Msk: Denies muscle cramps, joint pains  + sciatica, back pain Neuro: Denies weakness, numbness, tingling Skin: Denies rashes, bruising Psych: Denies depression, anxiety, hallucinations   Temp 98.7 F (37.1 C) (Oral)   Ht 5' (1.524 m)   Wt 226 lb (102.5 kg)   BMI 44.14 kg/m   Physical Exam Gen. Pleasant, well developed, well-nourished, in NAD HEENT - [place]/AT, PERRL, conjunctive clear, no scleral icterus, no nasal drainage, pharynx without erythema or exudate. Lungs: no use of accessory muscles, CTAB, no wheezes, rales or rhonchi Cardiovascular: RRR, No r/g/m, no peripheral edema Abdomen: BS present, soft, nontender,nondistended Musculoskeletal: No deformities, moves all four extremities, no cyanosis or clubbing, normal tone Neuro:  A&Ox3, CN II-XII intact, normal gait Skin:  Warm, dry, intact, no lesions  Recent Results (from the past 2160 hour(s))  ABO/Rh     Status: None   Collection Time: 04/07/18   2:06 PM  Result Value Ref Range   ABO/RH(D)      O POS Performed at [facility] [address] [place], [place] [address]   CBC     Status: None   Collection Time: 04/07/18  2:08 PM  Result Value Ref Range   WBC 5.5 4.0 - 10.5 K/uL   RBC 4.35 3.87 - 5.11 MIL/uL   Hemoglobin 12.0 12.0 - 15.0 g/dL   HCT 38.6 36.0 - 46.0 %   MCV 88.7 80.0 - 100.0 fL   MCH 27.6 26.0 - 34.0 pg   MCHC 31.1 30.0 - 36.0 g/dL   RDW 13.4 11.5 - 15.5 %   Platelets 308 150 - 400 K/uL   nRBC 0.0 0.0 - 0.2 %    Comment: Performed at [facility], [place] [address], [place], [place] [address]  Comprehensive metabolic panel     Status: Abnormal   Collection Time: 04/07/18  2:08 PM  Result Value Ref Range   Sodium 141 135 - 145 mmol/L   Potassium 3.2 (L) 3.5 - 5.1 mmol/L   Chloride 104 98 - 111 mmol/L   CO2 25 22 - 32 mmol/L   Glucose, Bld 170 (H) 70 - 99 mg/dL   BUN 12 8 - 23 mg/dL   Creatinine, Ser 0.73 0.44 - 1.00 mg/dL   Calcium 9.2 8.9 - 10.3 mg/dL   Total Protein 7.0 6.5 - 8.1 g/dL   Albumin 3.8 3.5 - 5.0 g/dL   AST 28 15 - 41 U/L   ALT 28 0 - 44 U/L   Alkaline Phosphatase 61 38 - 126 U/L   Total Bilirubin 0.8 0.3 - 1.2 mg/dL   GFR calc non Af Amer >60 >60 mL/min   GFR calc Af Amer >60 >60 mL/min    Comment: (NOTE) The eGFR has been calculated using the CKD EPI equation. This calculation has not been validated in all clinical situations. eGFR's persistently <60 mL/min signify possible Chronic Kidney Disease.    Anion gap 12 5 - 15    Comment: Performed at [facility], [place] [address], [place], [place] [address]  Type and screen [place]     Status: None   Collection Time: 04/07/18  2:08 PM  Result  Value Ref Range   ABO/RH(D) O POS    Antibody Screen NEG    Sample Expiration      04/10/2018 Performed at [facility], [place] [address], [place], [place] [address]   POCT i-Stat troponin I      Status: None   Collection Time: 04/07/18  2:22 PM  Result Value Ref Range   Troponin i, poc 0.02 0.00 - 0.08 ng/mL   Comment 3            Comment: Due to the release kinetics of cTnI, a negative result within the first hours of the onset of symptoms does not rule out myocardial infarction with certainty. If myocardial infarction is still suspected, repeat the test at appropriate intervals.   POCT i-Stat troponin I     Status: None   Collection Time: 04/07/18  5:54 PM  Result Value Ref Range   Troponin i, poc 0.02 0.00 - 0.08 ng/mL   Comment 3            Comment: Due to the release kinetics of cTnI, a negative result within the first hours of the onset of symptoms does not rule out myocardial infarction with certainty. If myocardial infarction is still suspected, repeat the test at appropriate intervals.     Assessment/Plan: History of recurrent UTIs  -Patient encouraged not to hold her urine, to drink plenty of water daily, and other ways to help avoid UTIs -Given increase in symptoms will refer to urology - Plan: Ambulatory referral to Urology  Mild intermittent asthma without complication -Continue following with pulmonology, Dr. Melvyn Novas -Continue albuterol inhaler as needed and Wixlea  Prediabetes -Last hemoglobin A1c 7.3% on 04/27/18 -Continue metformin XR 750 mg -Lifestyle modifications encouraged  Gastroesophageal reflux disease, esophagitis presence not specified -Continue omeprazole 80 mg daily -Continue following with GI, Dr. Silverio Decamp -Patient encouraged to avoid foods known to cause symptoms  Environmental and seasonal allergies -Continue Zyrtec 10 mg and montelukast 10 mg -Avoid allergens when able.  Essential hypertension -Controlled -Continue Norvasc 10 mg daily -Continue lifestyle modifications -Continue checking BP at home  Encounter to establish care -We reviewed the PMH, PSH, FH, SH, Meds and Allergies. -We provided refills for any medications we  will prescribe as needed. -We addressed current concerns per orders and patient instructions. -We have asked for records for pertinent exams, studies, vaccines and notes from previous providers. -We have advised patient to follow up per instructions below.  Urinary frequency  - Plan: Ambulatory referral to Urology  History of gout -Discussed avoiding foods known to cause symptoms -Patient encouraged to drink plenty of water daily  Follow-up PRN  Grier Mitts, MD

## 2018-06-30 NOTE — Telephone Encounter (Signed)
Call made to patient, made aware she can continue her Wixela. She states she cannot afford to come in right now due to $80 co-pays for specialists visit. She states she will cal back when she can afford to be seen. States she will go get her wixela today. Nothing further is needed at this time.

## 2018-06-30 NOTE — Patient Instructions (Signed)
Preventing Type 2 Diabetes Mellitus Type 2 diabetes (type 2 diabetes mellitus) is a long-term (chronic) disease that affects blood sugar (glucose) levels. Normally, a hormone called insulin allows glucose to enter cells in the body. The cells use glucose for energy. In type 2 diabetes, one or both of these problems may be present:  The body does not make enough insulin.  The body does not respond properly to insulin that it makes (insulin resistance). Insulin resistance or lack of insulin causes excess glucose to build up in the blood instead of going into cells. As a result, high blood glucose (hyperglycemia) develops, which can cause many complications. Being overweight or obese and having an inactive (sedentary) lifestyle can increase your risk for diabetes. Type 2 diabetes can be delayed or prevented by making certain nutrition and lifestyle changes. What nutrition changes can be made?   Eat healthy meals and snacks regularly. Keep a healthy snack with you for when you get hungry between meals, such as fruit or a handful of nuts.  Eat lean meats and proteins that are low in saturated fats, such as chicken, fish, egg whites, and beans. Avoid processed meats.  Eat plenty of fruits and vegetables and plenty of grains that have not been processed (whole grains). It is recommended that you eat: ? 1?2 cups of fruit every day. ? 2?3 cups of vegetables every day. ? 6?8 oz of whole grains every day, such as oats, whole wheat, bulgur, brown rice, quinoa, and millet.  Eat low-fat dairy products, such as milk, yogurt, and cheese.  Eat foods that contain healthy fats, such as nuts, avocado, olive oil, and canola oil.  Drink water throughout the day. Avoid drinks that contain added sugar, such as soda or sweet tea.  Follow instructions from your health care provider about specific eating or drinking restrictions.  Control how much food you eat at a time (portion size). ? Check food labels to find  out the serving sizes of foods. ? Use a kitchen scale to weigh amounts of foods.  Saute or steam food instead of frying it. Cook with water or broth instead of oils or butter.  Limit your intake of: ? Salt (sodium). Have no more than 1 tsp (2,400 mg) of sodium a day. If you have heart disease or high blood pressure, have less than ? tsp (1,500 mg) of sodium a day. ? Saturated fat. This is fat that is solid at room temperature, such as butter or fat on meat. What lifestyle changes can be made? Activity   Do moderate-intensity physical activity for at least 30 minutes on at least 5 days of the week, or as much as told by your health care provider.  Ask your health care provider what activities are safe for you. A mix of physical activities may be best, such as walking, swimming, cycling, and strength training.  Try to add physical activity into your day. For example: ? Park in spots that are farther away than usual, so that you walk more. For example, park in a far corner of the parking lot when you go to the office or the grocery store. ? Take a walk during your lunch break. ? Use stairs instead of elevators or escalators. Weight Loss  Lose weight as directed. Your health care provider can determine how much weight loss is best for you and can help you lose weight safely.  If you are overweight or obese, you may be instructed to lose at least 5?7 %   of your body weight. Alcohol and Tobacco   Limit alcohol intake to no more than 1 drink a day for nonpregnant women and 2 drinks a day for men. One drink equals 12 oz of beer, 5 oz of wine, or 1 oz of hard liquor.  Do not use any tobacco products, such as cigarettes, chewing tobacco, and e-cigarettes. If you need help quitting, ask your health care provider. Work With [place] Provider  Have your blood glucose tested regularly, as told by your health care provider.  Discuss your risk factors and how you can reduce your risk for  diabetes.  Get screening tests as told by your health care provider. You may have screening tests regularly, especially if you have certain risk factors for type 2 diabetes.  Make an appointment with a diet and nutrition specialist (registered dietitian). A registered dietitian can help you make a healthy eating plan and can help you understand portion sizes and food labels. Why are these changes important?  It is possible to prevent or delay type 2 diabetes and related health problems by making lifestyle and nutrition changes.  It can be difficult to recognize signs of type 2 diabetes. The best way to avoid possible damage to your body is to take actions to prevent the disease before you develop symptoms. What can happen if changes are not made?  Your blood glucose levels may keep increasing. Having high blood glucose for a long time is dangerous. Too much glucose in your blood can damage your blood vessels, heart, kidneys, nerves, and eyes.  You may develop prediabetes or type 2 diabetes. Type 2 diabetes can lead to many chronic health problems and complications, such as: ? Heart disease. ? Stroke. ? Blindness. ? Kidney disease. ? Depression. ? Poor circulation in the feet and legs, which could lead to surgical removal (amputation) in severe cases. Where to find support  Ask your health care provider to recommend a registered dietitian, diabetes educator, or weight loss program.  Look for local or online weight loss groups.  Join a gym, fitness club, or outdoor activity group, such as a walking club. Where to find more information To learn more about diabetes and diabetes prevention, visit:  American Diabetes Association (ADA): www.diabetes.[employer] of Diabetes and Digestive and Kidney Diseases: FindSpin.nl To learn more about healthy eating, visit:  The U.S. Department of Agriculture Scientist, research (physical sciences)), Choose My Plate:  http://wiley-williams.com/  Office of Disease Prevention and Health Promotion (ODPHP), Dietary Guidelines: SurferLive.at Summary  You can reduce your risk for type 2 diabetes by increasing your physical activity, eating healthy foods, and losing weight as directed.  Talk with your health care provider about your risk for type 2 diabetes. Ask about any blood tests or screening tests that you need to have. This information is not intended to replace advice given to you by your health care provider. Make sure you discuss any questions you have with your health care provider. Document Released: 09/03/2015 Document Revised: 04/23/2017 Document Reviewed: 07/03/2015 Elsevier Interactive Patient Education  2019 [employer].  Managing Your Hypertension Hypertension is commonly called high blood pressure. This is when the force of your blood pressing against the walls of your arteries is too strong. Arteries are blood vessels that carry blood from your heart throughout your body. Hypertension forces the heart to work harder to pump blood, and may cause the arteries to become narrow or stiff. Having untreated or uncontrolled hypertension can cause heart attack, stroke, kidney disease,  and other problems. What are blood pressure readings? A blood pressure reading consists of a higher number over a lower number. Ideally, your blood pressure should be below 120/80. The first ("top") number is called the systolic pressure. It is a measure of the pressure in your arteries as your heart beats. The second ("bottom") number is called the diastolic pressure. It is a measure of the pressure in your arteries as the heart relaxes. What does my blood pressure reading mean? Blood pressure is classified into four stages. Based on your blood pressure reading, your health care provider may use the following stages to determine what type of treatment you need, if any. Systolic pressure and diastolic  pressure are measured in a unit called mm Hg. Normal  Systolic pressure: below 950.  Diastolic pressure: below 80. Elevated  Systolic pressure: 932-671.  Diastolic pressure: below 80. Hypertension stage 1  Systolic pressure: 245-809.  Diastolic pressure: 98-33. Hypertension stage 2  Systolic pressure: 825 or above.  Diastolic pressure: 90 or above. What health risks are associated with hypertension? Managing your hypertension is an important responsibility. Uncontrolled hypertension can lead to:  A heart attack.  A stroke.  A weakened blood vessel (aneurysm).  Heart failure.  Kidney damage.  Eye damage.  Metabolic syndrome.  Memory and concentration problems. What changes can I make to manage my hypertension? Hypertension can be managed by making lifestyle changes and possibly by taking medicines. Your health care provider will help you make a plan to bring your blood pressure within a normal range. Eating and drinking   Eat a diet that is high in fiber and potassium, and low in salt (sodium), added sugar, and fat. An example eating plan is called the DASH (Dietary Approaches to Stop Hypertension) diet. To eat this way: ? Eat plenty of fresh fruits and vegetables. Try to fill half of your plate at each meal with fruits and vegetables. ? Eat whole grains, such as whole wheat pasta, brown rice, or whole grain bread. Fill about one quarter of your plate with whole grains. ? Eat low-fat diary products. ? Avoid fatty cuts of meat, processed or cured meats, and poultry with skin. Fill about one quarter of your plate with lean proteins such as fish, chicken without skin, beans, eggs, and tofu. ? Avoid premade and processed foods. These tend to be higher in sodium, added sugar, and fat.  Reduce your daily sodium intake. Most people with hypertension should eat less than 1,500 mg of sodium a day.  Limit alcohol intake to no more than 1 drink a day for nonpregnant women and  2 drinks a day for men. One drink equals 12 oz of beer, 5 oz of wine, or 1 oz of hard liquor. Lifestyle  Work with your health care provider to maintain a healthy body weight, or to lose weight. Ask what an ideal weight is for you.  Get at least 30 minutes of exercise that causes your heart to beat faster (aerobic exercise) most days of the week. Activities may include walking, swimming, or biking.  Include exercise to strengthen your muscles (resistance exercise), such as weight lifting, as part of your weekly exercise routine. Try to do these types of exercises for 30 minutes at least 3 days a week.  Do not use any products that contain nicotine or tobacco, such as cigarettes and e-cigarettes. If you need help quitting, ask your health care provider.  Control any long-term (chronic) conditions you have, such as high cholesterol or  diabetes. Monitoring  Monitor your blood pressure at home as told by your health care provider. Your personal target blood pressure may vary depending on your medical conditions, your age, and other factors.  Have your blood pressure checked regularly, as often as told by your health care provider. Working with your health care provider  Review all the medicines you take with your health care provider because there may be side effects or interactions.  Talk with your health care provider about your diet, exercise habits, and other lifestyle factors that may be contributing to hypertension.  Visit your health care provider regularly. Your health care provider can help you create and adjust your plan for managing hypertension. Will I need medicine to control my blood pressure? Your health care provider may prescribe medicine if lifestyle changes are not enough to get your blood pressure under control, and if:  Your systolic blood pressure is 130 or higher.  Your diastolic blood pressure is 80 or higher. Take medicines only as told by your health care  provider. Follow the directions carefully. Blood pressure medicines must be taken as prescribed. The medicine does not work as well when you skip doses. Skipping doses also puts you at risk for problems. Contact a health care provider if:  You think you are having a reaction to medicines you have taken.  You have repeated (recurrent) headaches.  You feel dizzy.  You have swelling in your ankles.  You have trouble with your vision. Get help right away if:  You develop a severe headache or confusion.  You have unusual weakness or numbness, or you feel faint.  You have severe pain in your chest or abdomen.  You vomit repeatedly.  You have trouble breathing. Summary  Hypertension is when the force of blood pumping through your arteries is too strong. If this condition is not controlled, it may put you at risk for serious complications.  Your personal target blood pressure may vary depending on your medical conditions, your age, and other factors. For most people, a normal blood pressure is less than 120/80.  Hypertension is managed by lifestyle changes, medicines, or both. Lifestyle changes include weight loss, eating a healthy, low-sodium diet, exercising more, and limiting alcohol. This information is not intended to replace advice given to you by your health care provider. Make sure you discuss any questions you have with your health care provider. Document Released: 02/04/2012 Document Revised: 04/09/2016 Document Reviewed: 04/09/2016 Elsevier Interactive Patient Education  2019 Elsevier Inc.  Urinary Frequency, Adult Urinary frequency means urinating more often than usual. You may urinate every 1-2 hours even though you drink a normal amount of fluid and do not have a bladder infection or condition. Although you urinate more often than normal, the total amount of urine produced in a day is normal. With urinary frequency, you may have an urgent need to urinate often. The stress  and anxiety of needing to find a bathroom quickly can make this urge worse. This condition may go away on its own or you may need treatment at home. Home treatment may include bladder training, exercises, taking medicines, or making changes to your diet. Follow these instructions at home: Bladder health   Keep a bladder diary if told by your health care provider. Keep track of: ? What you eat and drink. ? How often you urinate. ? How much you urinate.  Follow a bladder training program if told by your health care provider. This may include: ? Learning to delay  going to the bathroom. ? Double urinating (voiding). This helps if you are not completely emptying your bladder. ? Scheduled voiding.  Do Kegel exercises as told by your health care provider. Kegel exercises strengthen the muscles that help control urination, which may help the condition. Eating and drinking  If told by your health care provider, make diet changes, such as: ? Avoiding caffeine. ? Drinking fewer fluids, especially alcohol. ? Not drinking in the evening. ? Avoiding foods or drinks that may irritate the bladder. These include coffee, tea, soda, artificial sweeteners, citrus, tomato-based foods, and chocolate. ? Eating foods that help prevent or ease constipation. Constipation can make this condition worse. Your health care provider may recommend that you:  Drink enough fluid to keep your urine pale yellow.  Take over-the-counter or prescription medicines.  Eat foods that are high in fiber, such as beans, whole grains, and fresh fruits and vegetables.  Limit foods that are high in fat and processed sugars, such as fried or sweet foods. General instructions  Take over-the-counter and prescription medicines only as told by your health care provider.  Keep all follow-up visits as told by your health care provider. This is important. Contact a health care provider if:  You start urinating more often.  You feel  pain or irritation when you urinate.  You notice blood in your urine.  Your urine looks cloudy.  You develop a fever.  You begin vomiting. Get help right away if:  You are unable to urinate. Summary  Urinary frequency means urinating more often than usual. With urinary frequency, you may urinate every 1-2 hours even though you drink a normal amount of fluid and do not have a bladder infection or other bladder condition.  Your health care provider may recommend that you keep a bladder diary, follow a bladder training program, or make dietary changes.  If told by your health care provider, do Kegel exercises to strengthen the muscles that help control urination.  Take over-the-counter and prescription medicines only as told by your health care provider.  Contact a health care provider if your symptoms do not improve or get worse. This information is not intended to replace advice given to you by your health care provider. Make sure you discuss any questions you have with your health care provider. Document Released: 03/08/2009 Document Revised: 11/19/2017 Document Reviewed: 11/19/2017 Elsevier Interactive Patient Education  2019 [employer].

## 2018-06-30 NOTE — Telephone Encounter (Signed)
Yes ok to resume wixela at whatever dose/freq she was taking previously but needs ov before this runs out to see me or 1 of the nurse practitioners with her drug formulary in  hand so we can pick alternatives if this is not adequate.

## 2018-07-05 ENCOUNTER — Encounter: Payer: Self-pay | Admitting: Gastroenterology

## 2018-07-05 ENCOUNTER — Encounter: Payer: Self-pay | Admitting: Family Medicine

## 2018-07-05 ENCOUNTER — Ambulatory Visit: Payer: BC Managed Care – PPO | Admitting: Gastroenterology

## 2018-07-05 VITALS — BP 120/64 | HR 92 | Ht 60.0 in | Wt 225.0 lb

## 2018-07-05 DIAGNOSIS — K641 Second degree hemorrhoids: Secondary | ICD-10-CM

## 2018-07-05 NOTE — Patient Instructions (Signed)

## 2018-07-05 NOTE — Progress Notes (Signed)
PROCEDURE NOTE: The patient presents with symptomatic grade II  hemorrhoids, requesting rubber band ligation of his/her hemorrhoidal disease.  All risks, benefits and alternative forms of therapy were described and informed consent was obtained.   The anorectum was pre-medicated with 0.125% nitroglycerine and recticare The decision was made to band the Right anterior internal hemorrhoid, and the Excel was used to perform band ligation without complication.  Digital anorectal examination was then performed to assure proper positioning of the band, and to adjust the banded tissue as required.  The patient was discharged home without pain or other issues.  Dietary and behavioral recommendations were given and along with follow-up instructions.     The following adjunctive treatments were recommended:  Benefiber 1 teaspoon TID with meals  The patient will return in 2-4  for  follow-up and possible additional banding as required. No complications were encountered and the patient tolerated the procedure well.  Damaris Hippo , MD [phone]

## 2018-07-22 ENCOUNTER — Other Ambulatory Visit (HOSPITAL_COMMUNITY): Payer: BC Managed Care – PPO

## 2018-08-02 ENCOUNTER — Ambulatory Visit: Payer: BC Managed Care – PPO | Admitting: Gastroenterology

## 2018-08-02 ENCOUNTER — Encounter: Payer: Self-pay | Admitting: Gastroenterology

## 2018-08-02 VITALS — BP 120/74 | HR 68 | Ht 60.0 in | Wt 227.0 lb

## 2018-08-02 DIAGNOSIS — K641 Second degree hemorrhoids: Secondary | ICD-10-CM | POA: Diagnosis not present

## 2018-08-02 NOTE — Patient Instructions (Signed)

## 2018-08-02 NOTE — Progress Notes (Signed)
PROCEDURE NOTE: The patient presents with symptomatic grade II  hemorrhoids, requesting rubber band ligation of his/her hemorrhoidal disease.  All risks, benefits and alternative forms of therapy were described and informed consent was obtained.   The anorectum was pre-medicated with 0.125% nitroglycerine and Recticare The decision was made to band the Right posterior internal hemorrhoid, and the [place] was used to perform band ligation without complication.  Digital anorectal examination was then performed to assure proper positioning of the band, and to adjust the banded tissue as required.  The patient was discharged home without pain or other issues.  Dietary and behavioral recommendations were given and along with follow-up instructions.     The following adjunctive treatments were recommended:  Metamucil 1 teaspoon BID  The patient will return as needed for  follow-up and possible additional banding as required. No complications were encountered and the patient tolerated the procedure well.  Damaris Hippo , MD [phone]

## 2018-08-23 ENCOUNTER — Encounter (HOSPITAL_COMMUNITY): Payer: BC Managed Care – PPO

## 2018-08-24 ENCOUNTER — Other Ambulatory Visit: Payer: Self-pay | Admitting: Family Medicine

## 2018-08-24 DIAGNOSIS — Z1231 Encounter for screening mammogram for malignant neoplasm of breast: Secondary | ICD-10-CM

## 2018-08-25 ENCOUNTER — Ambulatory Visit: Payer: BC Managed Care – PPO | Admitting: Internal Medicine

## 2018-09-02 ENCOUNTER — Ambulatory Visit: Payer: BC Managed Care – PPO

## 2018-09-22 ENCOUNTER — Ambulatory Visit (INDEPENDENT_AMBULATORY_CARE_PROVIDER_SITE_OTHER): Payer: BC Managed Care – PPO | Admitting: Family Medicine

## 2018-09-22 ENCOUNTER — Other Ambulatory Visit: Payer: Self-pay

## 2018-09-22 ENCOUNTER — Encounter: Payer: Self-pay | Admitting: Family Medicine

## 2018-09-22 DIAGNOSIS — Z7189 Other specified counseling: Secondary | ICD-10-CM | POA: Diagnosis not present

## 2018-09-22 NOTE — Progress Notes (Signed)
Virtual Visit via Telephone Note  I connected with Beth Macias on 09/22/18 at  1:30 PM EDT by telephone and verified that I am speaking with the correct person using two identifiers.   I discussed the limitations, risks, security and privacy concerns of performing an evaluation and management service by telephone and the availability of in person appointments. I also discussed with the patient that there may be a patient responsible charge related to this service. The patient expressed understanding and agreed to proceed.  Location patient: home Location provider: home office Participants present for the call: patient, provider Patient did not have a visit in the prior 7 days to address this/these issue(s).   History of Present Illness: Pt wants a test for COVID-19.  Pt was sick in November 2019 with community acquired pneumonia.  Using her inhalers and nasal spray (ipatropium) for asthma and allergies over the last few months.  Pt states she is finally starting to feel better.  Pt states she feels she could've had COVID-19 this whole time.  Pt also notes increased stress/anxiety with watching the news.   Observations/Objective: Patient sounds cheerful and well on the phone. I do not appreciate any SOB. Speech and thought processing are grossly intact. Patient reported vitals:  Assessment and Plan: Educated About Covid-19 Virus Infection -pt advised initial symptoms unlikely related to COVID-19 -discussed pros and cons of ab testing. -pt wishes to wait on testing at this time.  Follow Up Instructions: F/u prn  I did not refer this patient for an OV in the next 24 hours for this/these issue(s).  I discussed the assessment and treatment plan with the patient. The patient was provided an opportunity to ask questions and all were answered. The patient agreed with the plan and demonstrated an understanding of the instructions.   The patient was advised to call back or seek  an in-person evaluation if the symptoms worsen or if the condition fails to improve as anticipated.  I provided 15 minutes of non-face-to-face time during this encounter.   Billie Ruddy, MD

## 2018-10-19 ENCOUNTER — Telehealth: Payer: Self-pay | Admitting: Family Medicine

## 2018-10-19 NOTE — Telephone Encounter (Signed)
Copied from [place] [id number]. Topic: Quick Communication - Rx Refill/Question >> Oct 19, 2018  5:05 PM Loma [place] wrote: CRM for notification. See Telephone encounter for: 10/19/18. PT is a new pt of Dr Volanda Napoleon, She has to test her blood and uses test strips. The pharmacy is wanting a script from Dr Volanda Napoleon. One Touch Ultra /# 50. Sometimes pt uses 4 or 5 a day.[id number] [address], [place] [phone] (Phone) [phone] (Fax)

## 2018-10-21 ENCOUNTER — Telehealth: Payer: Self-pay

## 2018-10-21 MED ORDER — GLUCOSE BLOOD VI STRP: ORAL_STRIP | 5 refills | Status: DC

## 2018-10-21 NOTE — Telephone Encounter (Signed)
Rx sent to pt pharmacy as requested 

## 2018-10-21 NOTE — Telephone Encounter (Signed)
Rx for test strips was sent to pt pharmacy for refill

## 2018-11-09 ENCOUNTER — Telehealth: Payer: Self-pay | Admitting: Family Medicine

## 2018-11-09 NOTE — Telephone Encounter (Signed)
Medication Refill - Medication: metFORMIN (GLUCOPHAGE-XR) 750 MG 24 hr tablet - Pt received a letter of recall for the above medication. Pt stated she no longer will take it and wants to know what can be done to get off of it and what actions can be done that she has to take nothing.   Has the patient contacted their pharmacy? Yes.   (Agent: If no, request that the patient contact the pharmacy for the refill.) (Agent: If yes, when and what did the pharmacy advise?) Yes - Pharmacy advised Pt to call PCP   Preferred Pharmacy (with phone number or street name):   Agent: Please be advised that RX refills may take up to 3 business days. We ask that you follow-up with your pharmacy.

## 2018-11-10 NOTE — Telephone Encounter (Signed)
Please advise 

## 2018-11-11 NOTE — Telephone Encounter (Signed)
Only certain lots of the extended release metformin were recalled.   Pt can take regular release metformin which has not been affected by the recall or another oral medication for diabetes such as glipizide or glimepiride.   Pt should also be decreased the amount of sweets and carbohydrates she is eating as well as increasing her physical activity.

## 2018-11-11 NOTE — Telephone Encounter (Signed)
Pt called to f/u on recall for metformin. Pt states that she has not had medication in 2 days to control diabetes. Please advise. Pt has urology appt today 1:30-3:30pm.  [id number] [place], [place]

## 2018-11-12 NOTE — Telephone Encounter (Signed)
FYI Spoke with pt states that she will stay off taking Metformin for now while she researches the 2 alternative diabetes medications advised. Pt also states that she has made some lifestyle changes with her diet and exercise and will continue to do that so she can stay off the DM medications. Pt advised to have a visit with Dr Volanda Napoleon before getting off her med, states that she will call the office on Monday and let Dr Volanda Napoleon know what she wants to do next.

## 2018-11-15 ENCOUNTER — Telehealth (HOSPITAL_COMMUNITY): Payer: Self-pay

## 2018-11-15 NOTE — Telephone Encounter (Signed)
LMTCB COVID prescreening for echo. Left echo appt details per DPR. 

## 2018-11-16 ENCOUNTER — Other Ambulatory Visit: Payer: Self-pay

## 2018-11-16 ENCOUNTER — Ambulatory Visit (HOSPITAL_COMMUNITY): Payer: Medicare Other | Attending: Internal Medicine

## 2018-11-16 DIAGNOSIS — R079 Chest pain, unspecified: Secondary | ICD-10-CM

## 2018-11-16 DIAGNOSIS — R0600 Dyspnea, unspecified: Secondary | ICD-10-CM | POA: Diagnosis present

## 2018-11-17 NOTE — Telephone Encounter (Addendum)
Patient stated she would not like to take either Metformin, Glipizide or Glimepiride.  She said she would like to try and loose weight to get her numbers better. She would like to be sent a meal and exercise plan for diabetics.

## 2018-11-18 NOTE — Telephone Encounter (Signed)
  FYI: Diabetic meals and exercise plan sent to pt on her home address on file.

## 2018-11-22 ENCOUNTER — Telehealth: Payer: Self-pay | Admitting: Cardiovascular Disease

## 2018-11-22 NOTE — Telephone Encounter (Signed)
Spoke with the pt and endorsed to her, her echo results and recommendations per Dr Johnsie Cancel. Pt states she would like an appt this week with Dr. Johnsie Cancel or an APP in our office to go over her echo results in person before starting these meds.  Pt is very frustrated and feels she will accept her results better if she could see a Provider in person, and go over any questions or concerns with these new finding.  Pt states she associates this all with having covid back in Dec 2019, and since then her health has drastically changed, and she would be more accepting of her results if she could just talk to a Provider in the office. Spent over 25 mins on the phone with the pt answering questions about her echo and meds recommended. Scheduled the pt to see Richardson Dopp PA-C for this Wednesday 7/1 at 1115. Informed the pt that I will make Dr. Johnsie Cancel aware that she wants to speak with him or an APP this week, to go over her echo results and recommendations in person.  Informed the pt that I will route this communication to Dr Johnsie Cancel as an Juluis Rainier.  Pt verbalized understanding and agrees with this plan. Pt more than gracious for all the assistance provided.

## 2018-11-22 NOTE — Telephone Encounter (Signed)
Result Notes for ECHOCARDIOGRAM COMPLETE  Notes recorded by Josue Hector, MD on 11/22/2018 at 9:20 AM EDT  ? Saw patient in January ? Echo just getting done now Has severe LV dysfunction Start entresto at lowest dose and coreg 3.125 bid. F/u Pharm D in 3 weeks to titrate entresto f/u with me 4-6 weeks She also still needs her myovue done D/C norvasc

## 2018-11-22 NOTE — Telephone Encounter (Signed)
  Patient is calling for her echo results.   

## 2018-11-23 ENCOUNTER — Telehealth: Payer: Self-pay | Admitting: Physician Assistant

## 2018-11-23 NOTE — Progress Notes (Addendum)
Cardiology Office Note:    Date:  11/24/2018   ID:  Beth Macias, DOB [birth year], MRN [id number]  PCP:  Billie Ruddy, MD  Cardiologist:  Jenkins Rouge, MD  Electrophysiologist:  None   Referring MD: Billie Ruddy, MD   Chief Complaint  Patient presents with  . Congestive Heart Failure  Patient also here to discuss recent Echo results.   History of Present Illness:    Beth Macias is a 68 y.o. female with a history of chronic systolic CHF, hypertension, hyperlipidemia, diabetes mellitus, morbid obesity, and asthma who is followed by Dr. Johnsie Cancel. Patient was first seen by Dr. Johnsie Cancel in 05/2018 for evaluation of chest pain.  An Echo and Lexiscan Myoview were ordered for further evaluation. The Echo was not done until last week and showed EF 30-35% with diffuse hypokinesis, indeterminate diastolic filling, and elevated left atrial and LVEDP. Dr. Johnsie Cancel has recommended starting low dose Entresto 24-26mg  twice daily and low Coreg 3.125mg  twice daily. However, patient requested an office visit to further discuss Echo results prior to starting these medications.   Patient reports dyspnea both at rest and with exertion as well as orthopnea, PND, and lower extremity edema since 03/2018. Symptoms all started following URI symptoms and diagnosis of pneumonia.  She thinks she may have had COVID at this time. Since then she feel like "multiple systems have broken down." She states she has to sleep on an incline. She also reports occasional chest tightness that "she refuses to own" because she does not want to think about having any more heart problems. Chest tightness is associated with exertion at time; however, she reports it usually starts after becoming significantly short of breath and becoming very anxious. She does not think the pain radiates anywhere and denies any associated nausea/vomiting. She does sometimes become very sweaty and have lightheadedness and dizziness - this  again normally develops after she becomes very short of breath. She denies any syncope. She reports occasional palpitations. She also notes some nasal congestion and mild productive cough with "milky phlegm" usually in the morning but denies any recent fevers, chills, or body aches.   Of note, she was diagnosed with a  UTI back 03/2018 and has increased urination since that time for which she is now seeing a Dealer for.   Patient previously on Lasix for lower extremity edema but states she stopped taking this about 3 years ago. She is very reluctant to be on multiple medications. She states her mother was on 15 medications when she died and refuses to do that.   Patient has remote smoking history but about 40 years ago in her mid 47's. She denies any alcohol or recreational drug use. Patient does have a family history of CAD with brother having a MI in his 65's and her father having a MI in his 36's, ultimately requiring CABG x4.   Prior CV studies:   The following studies were reviewed today:  Echocardiogram 11/16/2018: Impressions:  1. The left ventricle has moderate-severely reduced systolic function, with an ejection fraction of 30-35%. The cavity size was normal. There is mildly increased left ventricular wall thickness. Indeterminate diastolic filling due to E-A fusion.  Elevated left atrial and left ventricular end-diastolic pressures The E/e' is >15. Left ventricular diffuse hypokinesis.  2. The right ventricle has normal systolic function. The cavity was normal. There is no increase in right ventricular wall thickness.  3. The mitral valve is abnormal. Mild thickening of the mitral valve  leaflet. Mitral valve regurgitation is moderate by color flow Doppler. The MR jet is posteriorly-directed.  4. Tricuspid valve regurgitation is mild-moderate.  5. The aortic valve is grossly normal. No stenosis of the aortic valve.  6. The inferior vena cava was dilated in size with >50% respiratory  variability.  7. The interatrial septum was not well visualized.  8. Small to moderate-sized pericardial effusion.  9. The pericardial effusion is circumferential.  Summary: LVEF 30-35%, severe global hypokinesis with regional variation, mild LVH, elevated LV filling pressure, moderate MR, mild to moderate TR, RVSP 48 mmHg, small to moderate-sized circumferential pericardial effusion, dilated IVC that collapses.  Past Medical History:  Diagnosis Date  . Asthma   . Blood in stool   . Colon polyps   . Diabetes mellitus without complication ([facility])   . Diverticulitis   . Fibromyalgia   . GERD (gastroesophageal reflux disease)   . Gout   . Hypercholesteremia   . Hypertension   . Pneumonia   . Pneumonia   . UTI (urinary tract infection)    Surgical Hx: The patient  has a past surgical history that includes Cesarean section; Ovarian cyst removal; Lipoma excision; Appendectomy; Meniscus repair (Left); and Ovarian cyst surgery.   Current Medications: Current Meds  Medication Sig  . cetirizine (ZYRTEC) 10 MG tablet Take 10 mg by mouth daily.  . Cholecalciferol (VITAMIN D3) 125 MCG (5000 UT) CAPS Take 10,000 Units by mouth at bedtime.  Marland Kitchen glucose blood (ONETOUCH ULTRA) test strip Use to check blood sugar 4-5 times daily  . montelukast (SINGULAIR) 10 MG tablet Take 10 mg by mouth daily.  . Multiple Vitamin (MULTI-VITAMIN) tablet Take by mouth.  Marland Kitchen omeprazole (PRILOSEC) 20 MG capsule Take 20 mg by mouth daily.  Marland Kitchen PROAIR HFA 108 (90 Base) MCG/ACT inhaler Inhale 2 puffs into the lungs every 4 (four) hours as needed for wheezing.   . [DISCONTINUED] amLODipine (NORVASC) 10 MG tablet Take 10 mg by mouth daily.     Allergies:   Penicillins, Tape, and Gabapentin   Social History   Tobacco Use  . Smoking status: Former Smoker    Packs/day: 0.25    Years: 9.00    Pack years: 2.25    Types: Cigarettes    Quit date: 05/26/1976    Years since quitting: 42.5  . Smokeless tobacco: Never Used    Substance Use Topics  . Alcohol use: Never    Frequency: Never  . Drug use: Never     Family Hx: The patient's family history includes AAA (abdominal aortic aneurysm) in her father; Colon cancer in her maternal uncle; Crohn's disease in her paternal aunt; Diabetes in her mother; Gout in her brother and mother; Heart attack in her brother; Heart failure in her father and mother; Hypertension in her brother, father, and mother.  ROS:   Please see the history of present illness.    All other systems reviewed and are negative.   EKGs/Labs/Other Test Reviewed:    EKG:  EKG is not ordered today.    Recent Labs: 04/07/2018: ALT 28; BUN 12; Creatinine, Ser 0.73; Hemoglobin 12.0; Platelets 308; Potassium 3.2; Sodium 141   Recent Lipid Panel No results found for: CHOL, TRIG, HDL, CHOLHDL, LDLCALC, LDLDIRECT  Physical Exam:    VS:  BP 134/76   Pulse 86   Ht 5' (1.524 m)   Wt 227 lb (103 kg)   BMI 44.33 kg/m     Wt Readings from Last 3 Encounters:  11/24/18 227  lb (103 kg)  08/02/18 227 lb (103 kg)  07/05/18 225 lb (102.1 kg)    General: African-American obese female resting in no acute distress. Head: Normocephalic and atraumatic. Sclera clear. Neck: Supple. No JVD appreciated but somewhat difficult to assess due to body habitus.  Endocrinology: No thyromegaly appreciated.  Lungs: No increased work of breathing. Clear to auscultation bilaterally. No wheezes, rhonchi, or rales.  Heart: RRR. Distinct S1 and S2. No murmurs, gallops, or rubs. Radial pulses 2+ and equal bilaterally.  Abdomen: Abdomen soft, non-distended, and non-tender. Bowel sounds present.  MSK: Normal strength and tone for age. Extremities: 1+ pitting edema of bilateral lower extremities. No deformities.   Skin: Warm and dry.  Neuro: Alert and oriented x3. No focal deficits.  Psych: Normal affect. Responds appropriately.   ASSESSMENT & PLAN:    Acute on Chronic Systolic CHF - Patient seen by Dr. Johnsie Cancel in  05/2018 for atypical chest pain. Echo and [place] ordered for furthere evaluation. However, Echo was not done until last week on 11/16/2018 and showed EF 30-35% with diffuse hypokinesis, indeterminate diastolic filling, and elevated left atrial and LVEDP. Dr. Johnsie Cancel recommended stopping Amlodipine and starting low dose Entresto 24-26mg  twice daily and low dose Coreg 3.125mg  twice daily. However, patient requested an office visit to further discuss Echo results before starting any new medications.  - Patient reports dyspnea a rest and with exertion as well as orthopnea, PND, and lower extremity edema since 03/2018. I had a long discussion with patient about pathophysiology of heart failure, possible etiologies, and importance of medications. Patient has agreed to start low dose Coreg and Entresto as above.  - Patient has 1+ pitting edema on exam but lungs clear. Patient does not want to take daily Lasix because she does not want to be on so many medication. Therefore, will add PRN Lasix 40mg  daily for weight gain of over 3 lbs overnight or over 5lbs in 1 weeks. Patient to take K-Dur 94mEq if she has to take the Lasix.  - Will discontinue Amlodipine 10mg  daily.  - Will go ahead and check BNP today. - Etiology unclear at this time. Patient did have a URI prior to symptom onset in 03/2018 so possibly viral. However, patient has multiple cardiovascular risk factors as well as a family history of CAD. Will arrange for [facility] in the next 1-2 weeks for further ischemic evaluation.  - Per Dr. Johnsie Cancel, will arrange for patient to follow-up with Pharm D in 2 weeks to titrate Entresto. Will recheck CMET at that time (Patient heard Entresto could impact her liver function so she wanted to recheck that as well). Will then follow-up with Dr. Johnsie Cancel in 4-6 weeks. Will defer timing of repeat Echo to Dr. Johnsie Cancel.  - Emphasized the importance of weighing self daily in the morning, limiting sodium intake to 2g per  day, and limiting fluid intake to 64oz per day.   Moderate Mitral Regurgitation/ Mild to Moderate Tricuspid Regurgitation - Recent Echo also showed mild thickening of mitral valve leaflet and moderate mitral valve regurgitation as well as mild to moderate tricuspid regurgitation. Suspect this is secondary to LV dysfunction and volume overload. Will defer timing of repeat Echo to Dr. Johnsie Cancel.   Chest Pain - Patient reports some chest pain at rest and with exertion that usually occurs after she becomes significantly short of breath. Suspect this is secondary to acute systolic CHF. Will assess further with nuclear stress test as stated above.  Hypertension - BP 134/76  today. - Will discontinue Amlodipine 10mg  daily. - Will start Entresto and Coreg as above. - Advised patient to monitor BP over the next week and call us if SBP >150 or <100.   Hyperlipidemia Patient has hypercholesteremia listed in chart but does not appear to be on any medications. Patient reports she has not had her cholesterol checked in a while. Will go ahead and recheck lipids today. However, patient is adamant about not starting any more medications at this time.   Diabetes Mellitus  - Patient on Metformin at home.  - Management per primary team.   Obesity - BMI 44. Discussed that weight loss would be important.    Dispo:  Return in about 2 weeks (around 12/08/2018) for visit with Pharm D to titrate Entresto. Will follow-up with Dr. Johnsie Cancel in 4 weeks. Patient advised to call our office for weight gain as described above, worsening shortness of breath, or new chest pain.  Medication Adjustments/Labs and Tests Ordered: Current medicines are reviewed at length with the patient today.  Concerns regarding medicines are outlined above.   Tests Ordered: Orders Placed This Encounter  Procedures  . Pro b natriuretic peptide (BNP)  . Lipid panel  . Comprehensive metabolic panel  . MYOCARDIAL PERFUSION IMAGING   Medication  Changes: Meds ordered this encounter  Medications  . DISCONTD: carvedilol (COREG) 3.125 MG tablet    Sig: Take 1 tablet (3.125 mg total) by mouth 2 (two) times daily.    Dispense:  180 tablet    Refill:  3  . sacubitril-valsartan (ENTRESTO) 24-26 MG    Sig: Take 1 tablet by mouth 2 (two) times daily.    Dispense:  60 tablet    Refill:  6  . DISCONTD: furosemide (LASIX) 40 MG tablet    Sig: Take 1 tablet (40 mg total) by mouth as needed (AS NEED FOR WEIGTH GAIN 3LBS IN 24 HOURS 5LBS INA DAY).    Dispense:  30 tablet    Refill:  3  . DISCONTD: potassium chloride (K-DUR) 10 MEQ tablet    Sig: Take 1 tablet (10 mEq total) by mouth as needed (TAKE WHEN TAKING lASIX).    Dispense:  30 tablet    Refill:  3  . carvedilol (COREG) 3.125 MG tablet    Sig: Take 1 tablet (3.125 mg total) by mouth 2 (two) times daily.    Dispense:  180 tablet    Refill:  3    Order Specific Question:   Supervising Provider    Answer:   Skeet Latch [id number]  . potassium chloride (K-DUR) 10 MEQ tablet    Sig: Take 1 tablet (10 mEq total) by mouth as needed (TAKE WHEN TAKING LASIX).    Dispense:  30 tablet    Refill:  3    Order Specific Question:   Supervising Provider    Answer:   Skeet Latch [id number]  . furosemide (LASIX) 40 MG tablet    Sig: Take 1 tablet (40 mg total) by mouth as needed (AS NEED FOR WEIGTH GAIN 3LBS IN 24 HOURS 5LBS IN A WEEK).    Dispense:  30 tablet    Refill:  3    Order Specific Question:   Supervising Provider    Answer:   Skeet Latch [[id number]]    SignedDarreld Mclean, PA-C  11/24/2018 2:49 PM     Of note, spent >60 minutes with patient.   [facility], [place], [place]  [address] Phone: [phone]; Fax: [phone]

## 2018-11-23 NOTE — Telephone Encounter (Signed)
New Message          COVID-19 Pre-Screening Questions:   In the past 7 to 10 days have you had a cough,  shortness of breath, headache, congestion, fever (100 or greater) body aches, chills, sore throat, or sudden loss of taste or sense of smell? yes, sob since November    Have you been around anyone with known Covid 19. NO  Have you been around anyone who is awaiting Covid 19 test results in the past 7 to 10 days? NO  Have you been around anyone who has been exposed to Covid 19, or has mentioned symptoms of Covid 19 within the past 7 to 10 days? NO   If you have any concerns/questions about symptoms patients report during screening (either on the phone or at threshold). Contact the provider seeing the patient or DOD for further guidance.  If neither are available contact a member of the leadership team.

## 2018-11-24 ENCOUNTER — Ambulatory Visit (INDEPENDENT_AMBULATORY_CARE_PROVIDER_SITE_OTHER): Payer: Medicare Other | Admitting: Physician Assistant

## 2018-11-24 ENCOUNTER — Encounter: Payer: Self-pay | Admitting: Physician Assistant

## 2018-11-24 ENCOUNTER — Other Ambulatory Visit: Payer: Self-pay

## 2018-11-24 ENCOUNTER — Encounter: Payer: Self-pay | Admitting: *Deleted

## 2018-11-24 VITALS — BP 134/76 | HR 86 | Ht 60.0 in | Wt 227.0 lb

## 2018-11-24 DIAGNOSIS — I5022 Chronic systolic (congestive) heart failure: Secondary | ICD-10-CM

## 2018-11-24 DIAGNOSIS — E785 Hyperlipidemia, unspecified: Secondary | ICD-10-CM | POA: Diagnosis not present

## 2018-11-24 DIAGNOSIS — I10 Essential (primary) hypertension: Secondary | ICD-10-CM | POA: Diagnosis not present

## 2018-11-24 DIAGNOSIS — E118 Type 2 diabetes mellitus with unspecified complications: Secondary | ICD-10-CM

## 2018-11-24 MED ORDER — FUROSEMIDE 40 MG PO TABS: 40.0000 mg | ORAL_TABLET | ORAL | 3 refills | Status: DC | PRN

## 2018-11-24 MED ORDER — POTASSIUM CHLORIDE ER 10 MEQ PO TBCR: 10.0000 meq | EXTENDED_RELEASE_TABLET | ORAL | 3 refills | Status: DC | PRN

## 2018-11-24 MED ORDER — CARVEDILOL 3.125 MG PO TABS: 3.1250 mg | ORAL_TABLET | Freq: Two times a day (BID) | ORAL | 3 refills | Status: DC

## 2018-11-24 MED ORDER — ENTRESTO 24-26 MG PO TABS: 1.0000 | ORAL_TABLET | Freq: Two times a day (BID) | ORAL | 6 refills | Status: DC

## 2018-11-24 NOTE — Patient Instructions (Addendum)
Medication Instructions:   Stop taking Amlodipine 10 mg once a day   Start taking Entresto  24-26 mg twice day   Start Coreg 3.125 mg twice a day   Start taking Lasix 40 mg as needed for weight gain of 3 lbs in  24 hours  5lbs in a week   Start taking  Potassium 10 meq as needed  take when taking Lasix   If you need a refill on your cardiac medications before your next appointment, please call your pharmacy.   Lab work:  Bnp Lipids today  cmet in 2 weeks same day as pharm d appointment    If you have labs (blood work) drawn today and your tests are completely normal, you will receive your results only by: Marland Kitchen MyChart Message (if you have MyChart) OR . A paper copy in the mail If you have any lab test that is abnormal or we need to change your treatment, we will call you to review the results.  Testing/Procedures: Your physician has requested that you have a lexiscan myoview. For further information please visit [email or web address]. Please follow instruction sheet, as given.   Follow-Up:   2 WEEKS FOLLOW UP  WITH PHARM- D  New Entresto Start   DR Johnsie Cancel 4- 5 WEEKS    Any Other Special Instructions Will Be Listed Below (If Applicable).  Keep a daily log of Blood pressure contact if top number running higher than 150 and lower than 100 and upload readings to Mychart

## 2018-11-24 NOTE — Progress Notes (Deleted)
Cardiology Office Note:    Date:  11/24/2018   ID:  Beth Macias, DOB 07-08-1950, MRN [id number]  PCP:  Billie Ruddy, MD  Cardiologist:  No primary care provider on file.  Electrophysiologist:  None   Referring MD: Billie Ruddy, MD   No chief complaint on file. ***  History of Present Illness:    Beth Macias is a 68 y.o. female with a history of   Past Medical History:  Diagnosis Date  . Asthma   . Blood in stool   . Colon polyps   . Diabetes mellitus without complication ([place])   . Diverticulitis   . Fibromyalgia   . GERD (gastroesophageal reflux disease)   . Gout   . Hypercholesteremia   . Hypertension   . Pneumonia   . Pneumonia   . UTI (urinary tract infection)     Past Surgical History:  Procedure Laterality Date  . APPENDECTOMY    . CESAREAN SECTION     x 3  . LIPOMA EXCISION    . MENISCUS REPAIR Left   . OVARIAN CYST REMOVAL    . OVARIAN CYST SURGERY      Current Medications: No outpatient medications have been marked as taking for the 11/24/18 encounter (Appointment) with Richardson Dopp T, PA-C.     Allergies:   Penicillins, Tape, and Gabapentin   Social History   Socioeconomic History  . Marital status: Legally Separated    Spouse name: Not on file  . Number of children: 3  . Years of education: Not on file  . Highest education level: Not on file  Occupational History  . Occupation: Pharmacist, hospital  Social Needs  . Financial resource strain: Not on file  . Food insecurity    Worry: Not on file    Inability: Not on file  . Transportation needs    Medical: Not on file    Non-medical: Not on file  Tobacco Use  . Smoking status: Former Smoker    Packs/day: 0.25    Years: 9.00    Pack years: 2.25    Types: Cigarettes    Quit date: 05/26/1976    Years since quitting: 42.5  . Smokeless tobacco: Never Used  Substance and Sexual Activity  . Alcohol use: Never    Frequency: Never  . Drug use: Never  . Sexual activity:  Not on file  Lifestyle  . Physical activity    Days per week: Not on file    Minutes per session: Not on file  . Stress: Not on file  Relationships  . Social Herbalist on phone: Not on file    Gets together: Not on file    Attends religious service: Not on file    Active member of club or organization: Not on file    Attends meetings of clubs or organizations: Not on file    Relationship status: Not on file  Other Topics Concern  . Not on file  Social History Narrative  . Not on file     Family History: The patient's ***family history includes AAA (abdominal aortic aneurysm) in her father; Colon cancer in her maternal uncle; Crohn's disease in her paternal aunt; Diabetes in her mother; Gout in her brother and mother; Heart attack in her brother; Heart failure in her father and mother; Hypertension in her brother, father, and mother.  ROS:   Please see the history of present illness.    *** All other systems reviewed and  are negative.  EKGs/Labs/Other Studies Reviewed:    The following studies were reviewed today: ***  EKG:  EKG is *** ordered today.  The ekg ordered today demonstrates ***  Recent Labs: 04/07/2018: ALT 28; BUN 12; Creatinine, Ser 0.73; Hemoglobin 12.0; Platelets 308; Potassium 3.2; Sodium 141  Recent Lipid Panel No results found for: CHOL, TRIG, HDL, CHOLHDL, VLDL, LDLCALC, LDLDIRECT  Physical Exam:    VS:  There were no vitals taken for this visit.    Wt Readings from Last 3 Encounters:  08/02/18 227 lb (103 kg)  07/05/18 225 lb (102.1 kg)  06/30/18 226 lb (102.5 kg)     GEN: *** Well nourished, well developed in no acute distress HEENT: Normal NECK: No JVD; No carotid bruits LYMPHATICS: No lymphadenopathy CARDIAC: ***RRR, no murmurs, rubs, gallops RESPIRATORY:  Clear to auscultation without rales, wheezing or rhonchi  ABDOMEN: Soft, non-tender, non-distended MUSCULOSKELETAL:  No edema; No deformity  SKIN: Warm and dry NEUROLOGIC:   Alert and oriented x 3 PSYCHIATRIC:  Normal affect   ASSESSMENT:    1. Chronic systolic CHF (congestive heart failure) (HCC)    PLAN:    In order of problems listed above:  1. ***   Medication Adjustments/Labs and Tests Ordered: Current medicines are reviewed at length with the patient today.  Concerns regarding medicines are outlined above.  No orders of the defined types were placed in this encounter.  No orders of the defined types were placed in this encounter.   There are no Patient Instructions on file for this visit.   Signed, Darreld Mclean, PA-C  11/24/2018 8:11 AM    [place]

## 2018-12-05 ENCOUNTER — Emergency Department (HOSPITAL_COMMUNITY)
Admission: EM | Admit: 2018-12-05 | Discharge: 2018-12-05 | Disposition: A | Discharge status: Home / Self Care | Payer: Medicare Other | Attending: Emergency Medicine | Admitting: Emergency Medicine

## 2018-12-05 ENCOUNTER — Emergency Department (HOSPITAL_COMMUNITY): Payer: Medicare Other

## 2018-12-05 ENCOUNTER — Encounter (HOSPITAL_COMMUNITY): Payer: Self-pay | Admitting: Obstetrics and Gynecology

## 2018-12-05 ENCOUNTER — Other Ambulatory Visit: Payer: Self-pay

## 2018-12-05 DIAGNOSIS — Z79899 Other long term (current) drug therapy: Secondary | ICD-10-CM | POA: Diagnosis not present

## 2018-12-05 DIAGNOSIS — E119 Type 2 diabetes mellitus without complications: Secondary | ICD-10-CM | POA: Insufficient documentation

## 2018-12-05 DIAGNOSIS — Z87891 Personal history of nicotine dependence: Secondary | ICD-10-CM | POA: Diagnosis not present

## 2018-12-05 DIAGNOSIS — I313 Pericardial effusion (noninflammatory): Secondary | ICD-10-CM | POA: Insufficient documentation

## 2018-12-05 DIAGNOSIS — I110 Hypertensive heart disease with heart failure: Secondary | ICD-10-CM | POA: Insufficient documentation

## 2018-12-05 DIAGNOSIS — I509 Heart failure, unspecified: Secondary | ICD-10-CM | POA: Insufficient documentation

## 2018-12-05 DIAGNOSIS — Z7984 Long term (current) use of oral hypoglycemic drugs: Secondary | ICD-10-CM | POA: Diagnosis not present

## 2018-12-05 DIAGNOSIS — I5023 Acute on chronic systolic (congestive) heart failure: Secondary | ICD-10-CM

## 2018-12-05 DIAGNOSIS — I3139 Other pericardial effusion (noninflammatory): Secondary | ICD-10-CM

## 2018-12-05 DIAGNOSIS — R0602 Shortness of breath: Secondary | ICD-10-CM | POA: Diagnosis present

## 2018-12-05 LAB — BASIC METABOLIC PANEL
Anion gap: 11 (ref 5–15)
BUN: 26 mg/dL — ABNORMAL HIGH (ref 8–23)
CO2: 24 mmol/L (ref 22–32)
Calcium: 9.6 mg/dL (ref 8.9–10.3)
Chloride: 105 mmol/L (ref 98–111)
Creatinine, Ser: 0.79 mg/dL (ref 0.44–1.00)
GFR calc Af Amer: >60 mL/min (ref >60)
GFR calc non Af Amer: >60 mL/min (ref >60)
Glucose, Bld: 132 mg/dL — ABNORMAL HIGH (ref 70–99)
Potassium: 3.9 mmol/L (ref 3.5–5.1)
Sodium: 140 mmol/L (ref 135–145)

## 2018-12-05 LAB — CBC
HCT: 42.5 % (ref 36.0–46.0)
Hemoglobin: 13.7 g/dL (ref 12.0–15.0)
MCH: 27.4 pg (ref 26.0–34.0)
MCHC: 32.2 g/dL (ref 30.0–36.0)
MCV: 85 fL (ref 80.0–100.0)
Platelets: 274 10*3/uL (ref 150–400)
RBC: 5 MIL/uL (ref 3.87–5.11)
RDW: 14.5 % (ref 11.5–15.5)
WBC: 9.2 10*3/uL (ref 4.0–10.5)
nRBC: 0 % (ref 0.0–0.2)

## 2018-12-05 LAB — TROPONIN I (HIGH SENSITIVITY): Troponin I (High Sensitivity): 3 ng/L (ref <18)

## 2018-12-05 LAB — BRAIN NATRIURETIC PEPTIDE: B Natriuretic Peptide: 2464.6 pg/mL — ABNORMAL HIGH (ref 0.0–100.0)

## 2018-12-05 LAB — D-DIMER, QUANTITATIVE: D-Dimer, Quant: 0.6 ug/mL-FEU — ABNORMAL HIGH (ref 0.00–0.50)

## 2018-12-05 MED ORDER — FUROSEMIDE 40 MG PO TABS: 40.0000 mg | ORAL_TABLET | Freq: Every day | ORAL | 0 refills | Status: DC

## 2018-12-05 MED ORDER — SODIUM CHLORIDE (PF) 0.9 % IJ SOLN
INTRAMUSCULAR | Status: AC
  Filled 2018-12-05: qty 50

## 2018-12-05 MED ORDER — IOHEXOL 350 MG/ML SOLN
100.0000 mL | Freq: Once | INTRAVENOUS | Status: AC | PRN
  Administered 2018-12-05: 100 mL via INTRAVENOUS

## 2018-12-05 MED ORDER — ALBUTEROL SULFATE HFA 108 (90 BASE) MCG/ACT IN AERS
4.0000 | INHALATION_SPRAY | Freq: Once | RESPIRATORY_TRACT | Status: AC
  Administered 2018-12-05: 4 via RESPIRATORY_TRACT
  Filled 2018-12-05: qty 6.7

## 2018-12-05 MED ORDER — FUROSEMIDE 10 MG/ML IJ SOLN
40.0000 mg | Freq: Once | INTRAMUSCULAR | Status: AC
  Administered 2018-12-05: 40 mg via INTRAVENOUS
  Filled 2018-12-05: qty 4

## 2018-12-05 NOTE — Discharge Instructions (Addendum)
Increase lasix to 40 mg daily for the next 5 days.   Get your kidney function checked on Friday as scheduled   See your cardiologist in 1-2 weeks   Return to ER if you have worse shortness of breath, chest pain

## 2018-12-05 NOTE — ED Provider Notes (Signed)
Medical screening examination/treatment/procedure(s) were conducted as a shared visit with non-physician practitioner(s) and myself.  I personally evaluated the patient during the encounter.  EKG Interpretation  Date/Time:  Sunday December 05 2018 12:14:13 EDT Ventricular Rate:  93 PR Interval:    QRS Duration: 97 QT Interval:  377 QTC Calculation: 469 R Axis:   -69 Text Interpretation:  Sinus rhythm Probable left atrial enlargement Inferior infarct, old Consider anterior infarct Confirmed by Fredia Sorrow [phone]) on 12/05/2018 2:38:28 PM   Results for orders placed or performed during the hospital encounter of 12/05/18  CBC  Result Value Ref Range   WBC 9.2 4.0 - 10.5 K/uL   RBC 5.00 3.87 - 5.11 MIL/uL   Hemoglobin 13.7 12.0 - 15.0 g/dL   HCT 42.5 36.0 - 46.0 %   MCV 85.0 80.0 - 100.0 fL   MCH 27.4 26.0 - 34.0 pg   MCHC 32.2 30.0 - 36.0 g/dL   RDW 14.5 11.5 - 15.5 %   Platelets 274 150 - 400 K/uL   nRBC 0.0 0.0 - 0.2 %   Dg Chest 2 View  Result Date: 12/05/2018 CLINICAL DATA:  Shortness of breath. EXAM: CHEST - 2 VIEW COMPARISON:  04/26/2018 FINDINGS: Stable moderate cardiac enlargement. Stable scarring in left midlung. No evidence of pulmonary infiltrate or edema. No evidence of pleural effusion. Thoracic spine degenerative changes again noted. IMPRESSION: Stable moderate cardiac enlargement. No active lung disease. Electronically Signed   By: Marlaine Hind M.D.   On: 12/05/2018 13:29    Patient seen by me along with physician assistant.  Patient with a complaint of having shortness of breath and breathing issues for a long time.  Was seen by pulmonary medicine in the fall for this complaint.  But it did improve but has come back recently.  Patient also complained of intermittent chest pain starting at 9 PM last night lasting till 3 in the morning but only lasts seconds but occurred frequently.  It was left-sided chest and went to left arm.  This is a new thing for her.  Patient's  been seen by cardiology and is process of work-up has had an echo which showed a low ejection fraction.  So patient could have propensity for CHF.  Patient tells me that she did take some Lasix because she had gained 3 pounds as per cardiology told her to do.  Patient's lungs were clear here but she had a little bit of wheezing for the physician assistant.  Oxygen sats on room air 98 200%.  Patient nontoxic no acute distress.  Does have some slight edema to both lower extremities.  Work-up will be for the shortness of breath include a d-dimer also a BMP work-up for any concerns for CHF.  Patient will also require a troponin.  Based on the fact that she has been without any chest pain since 3 in the morning which would be 12 hours ago a single troponin will probably be very helpful.  If elevated she may require delta.   Fredia Sorrow, MD 12/05/18 [phone]

## 2018-12-05 NOTE — ED Notes (Signed)
Pt is sitting in a chair at bedside to make it easier to get back and forth to the restroom. Pt declined to have purewick placed, states "I can walk"

## 2018-12-05 NOTE — ED Provider Notes (Signed)
  Physical Exam  BP (!) 151/91 (BP Location: Left Arm)   Pulse 97   Temp 98.3 F (36.8 C) (Oral)   Resp 18   SpO2 98%   Physical Exam  ED Course/Procedures     Procedures  MDM  Patient care assumed at 3 PM.  Patient recently had shortness of breath over the last several weeks.  She had recent work-up and had seen cardiology about 3 weeks ago and had an echo that showed EF of 30% with a moderate pericardial effusion. She was started on entresto and prn lasix.  She has slightly worsening orthopnea of this morning so she came in for evaluation.  Signout pending a d-dimer as well as troponin and BNP.  7:31 PM BNP 2400. D-dimer elevated. CTA showed mod pericardial effusion. She is not hypoxic and has no oxygen requirement. Offered admission for repeat echo and diuresis vs scheduled lasix and outpatient follow up.  Patient already has labs scheduled for this Friday as well as a mild perfusion study outpatient .  I discussed both options and patient would rather go home. I discussed with Dr. Kalman Shan from cardiology. He agreed with lasix 40 mg daily and outpatient follow up.  Patient is hypertensive and not hypotensive has no signs of cardiac tamponade.  He recommend just diuresis for now and patient does have close follow-up with cardiology already.       Drenda Freeze, MD 12/05/18 [phone]

## 2018-12-05 NOTE — ED Provider Notes (Signed)
[facility] DEPT Provider Note   CSN: [id number] Arrival date & time: 12/05/18  1136    History   Chief Complaint Chief Complaint  Patient presents with  . Shortness of Breath    HPI Beth Macias is a 68 y.o. female.     HPI   Pt is a 68 y/o female with a h/o asthma, DM, GERD, HTN, HLD who presents to the ED today for eval of SOB. States she has had SOB since the weather changed about 3 days ago. States she has had wheezing, orthopnea and SOB as well.  Last night sxs seemed worse.   She also c/o mild left sided chest pain that occurred last night when she was laying down. Pain was intermittent and lasted for about 1 second at a time. Certain movements seemed to make the pain worse. Not associated with exertion.  Pain is intermittently pleuritic. She reports BLE swelling that began earlier last week.  She states she has been coughing up a frothy white foamy phlegm recently.  Denies fevers.   Denies hemoptysis, recent surgery/trauma, recent long travel, hormone use, personal hx of cancer, or hx of DVT/PE.   She was recently diagnosed with CHF. She denies any changes in her weight. Has PRN lasix but has not used any because her weight has not increased.   Past Medical History:  Diagnosis Date  . Asthma   . Blood in stool   . Colon polyps   . Diabetes mellitus without complication ([place])   . Diverticulitis   . Fibromyalgia   . GERD (gastroesophageal reflux disease)   . Gout   . Hypercholesteremia   . Hypertension   . Pneumonia   . Pneumonia   . UTI (urinary tract infection)     Patient Active Problem List   Diagnosis Date Noted  . History of recurrent UTIs 06/30/2018  . Mild intermittent asthma without complication [id number]  . Prediabetes 06/30/2018  . Environmental and seasonal allergies 06/30/2018  . Vitamin D deficiency 08/07/2016  . Morbid obesity with BMI of 40.0-44.9, adult ([place]) 02/18/2016  . Extrinsic asthma without  complication [id number]  . SOB (shortness of breath) 06/19/2015  . S/P carpal tunnel release 07/24/2014  . S/P cubital tunnel release 07/24/2014  . Cubital tunnel syndrome, right 07/06/2014  . Right carpal tunnel syndrome 07/06/2014  . Numbness of right hand 05/21/2014  . Hypokalemia 08/16/2013  . Diabetes mellitus type 2, uncomplicated ([place]) [id number]  . GERD (gastroesophageal reflux disease) 08/28/2011  . HTN (hypertension) 08/28/2011    Past Surgical History:  Procedure Laterality Date  . APPENDECTOMY    . CESAREAN SECTION     x 3  . LIPOMA EXCISION    . MENISCUS REPAIR Left   . OVARIAN CYST REMOVAL    . OVARIAN CYST SURGERY       OB History   No obstetric history on file.      Home Medications    Prior to Admission medications   Medication Sig Start Date End Date Taking? Authorizing Provider  carvedilol (COREG) 3.125 MG tablet Take 1 tablet (3.125 mg total) by mouth 2 (two) times daily. 11/24/18 02/22/19  Sande Rives E, PA-C  cetirizine (ZYRTEC) 10 MG tablet Take 10 mg by mouth daily.    [provider]  Cholecalciferol (VITAMIN D3) 125 MCG (5000 UT) CAPS Take 10,000 Units by mouth at bedtime.    [provider]  furosemide (LASIX) 40 MG tablet Take 1 tablet (40 mg total)  by mouth as needed (AS NEED FOR WEIGTH GAIN 3LBS IN 24 HOURS 5LBS IN A WEEK). 11/24/18 02/22/19  Sande Rives E, PA-C  glucose blood (ONETOUCH ULTRA) test strip Use to check blood sugar 4-5 times daily 10/21/18   Billie Ruddy, MD  metFORMIN (GLUCOPHAGE-XR) 750 MG 24 hr tablet Take 750 mg by mouth every evening.    [provider]  montelukast (SINGULAIR) 10 MG tablet Take 10 mg by mouth daily. 06/22/18   [provider]  Multiple Vitamin (MULTI-VITAMIN) tablet Take by mouth.    [provider]  omeprazole (PRILOSEC) 20 MG capsule Take 20 mg by mouth daily.    [provider]  potassium chloride (K-DUR) 10 MEQ tablet Take 1 tablet (10 mEq  total) by mouth as needed (TAKE WHEN TAKING LASIX). 11/24/18 02/22/19  Darreld Mclean, PA-C  PROAIR HFA 108 [phone] Base) MCG/ACT inhaler Inhale 2 puffs into the lungs every 4 (four) hours as needed for wheezing.  05/08/16   [provider]  sacubitril-valsartan (ENTRESTO) 24-26 MG Take 1 tablet by mouth 2 (two) times daily. 11/24/18   Darreld Mclean, PA-C    Family History Family History  Problem Relation Age of Onset  . Heart failure Mother   . Gout Mother   . Hypertension Mother   . Diabetes Mother   . Heart failure Father   . Hypertension Father   . AAA (abdominal aortic aneurysm) Father   . Gout Brother   . Hypertension Brother   . Heart attack Brother   . Colon cancer Maternal Uncle   . Crohn's disease Paternal Aunt     Social History Social History   Tobacco Use  . Smoking status: Former Smoker    Packs/day: 0.25    Years: 9.00    Pack years: 2.25    Types: Cigarettes    Quit date: 05/26/1976    Years since quitting: 42.5  . Smokeless tobacco: Never Used  Substance Use Topics  . Alcohol use: Never    Frequency: Never  . Drug use: Never     Allergies   Penicillins, Tape, and Gabapentin   Review of Systems Review of Systems  Constitutional: Negative for fever.  HENT: Negative for ear pain and sore throat.   Eyes: Negative for visual disturbance.  Respiratory: Positive for cough, shortness of breath and wheezing.   Cardiovascular: Positive for chest pain (resolved) and leg swelling. Negative for palpitations.  Gastrointestinal: Negative for abdominal pain, constipation, diarrhea, nausea and vomiting.  Genitourinary: Negative for dysuria and hematuria.  Musculoskeletal: Negative for back pain.  Skin: Negative for rash.  Neurological: Negative for headaches.  All other systems reviewed and are negative.  Physical Exam Updated Vital Signs BP (!) 151/91 (BP Location: Left Arm)   Pulse 81   Temp 98.3 F (36.8 C) (Oral)   Resp 18   SpO2 99%    Physical Exam Vitals signs and nursing note reviewed.  Constitutional:      General: She is not in acute distress.    Appearance: She is well-developed.  HENT:     Head: Normocephalic and atraumatic.  Eyes:     Conjunctiva/sclera: Conjunctivae normal.  Neck:     Musculoskeletal: Neck supple.  Cardiovascular:     Rate and Rhythm: Normal rate and regular rhythm.     Pulses: Normal pulses.     Heart sounds: Normal heart sounds. No murmur.  Pulmonary:     Effort: Pulmonary effort is normal. No tachypnea or  respiratory distress.     Breath sounds: Examination of the left-upper field reveals wheezing. Wheezing (rare) present. No decreased breath sounds, rhonchi or rales.  Abdominal:     Palpations: Abdomen is soft.     Tenderness: There is no abdominal tenderness.  Musculoskeletal:     Comments: Trace BLE edema. No calf TTP or erythema.   Skin:    General: Skin is warm and dry.  Neurological:     Mental Status: She is alert.    ED Treatments / Results  Labs (all labs ordered are listed, but only abnormal results are displayed) Labs Reviewed  BASIC METABOLIC PANEL - Abnormal; Notable for the following components:      Result Value   Glucose, Bld 132 (*)    BUN 26 (*)    All other components within normal limits  CBC  BRAIN NATRIURETIC PEPTIDE  D-DIMER, QUANTITATIVE (NOT AT [facility])  TROPONIN I (HIGH SENSITIVITY)    EKG EKG Interpretation  Date/Time:  Sunday December 05 2018 12:14:13 EDT Ventricular Rate:  93 PR Interval:    QRS Duration: 97 QT Interval:  377 QTC Calculation: 469 R Axis:   -69 Text Interpretation:  Sinus rhythm Probable left atrial enlargement Inferior infarct, old Consider anterior infarct Confirmed by Fredia Sorrow [phone]) on 12/05/2018 2:38:28 PM   Radiology Dg Chest 2 View  Result Date: 12/05/2018 CLINICAL DATA:  Shortness of breath. EXAM: CHEST - 2 VIEW COMPARISON:  04/26/2018 FINDINGS: Stable moderate cardiac enlargement. Stable scarring in  left midlung. No evidence of pulmonary infiltrate or edema. No evidence of pleural effusion. Thoracic spine degenerative changes again noted. IMPRESSION: Stable moderate cardiac enlargement. No active lung disease. Electronically Signed   By: Marlaine Hind M.D.   On: 12/05/2018 13:29    Procedures Procedures (including critical care time)  Medications Ordered in ED Medications  furosemide (LASIX) injection 40 mg (has no administration in time range)  albuterol (VENTOLIN HFA) 108 (90 Base) MCG/ACT inhaler 4 puff (4 puffs Inhalation Given 12/05/18 1430)     Initial Impression / Assessment and Plan / ED Course  I have reviewed the triage vital signs and the nursing notes.  Pertinent labs & imaging results that were available during my care of the patient were reviewed by me and considered in my medical decision making (see chart for details).   Final Clinical Impressions(s) / ED Diagnoses   Final diagnoses:  None   68 year old female with history of CHF presenting for evaluation of shortness of breath.  Also complaining of wheezing and orthopnea.  Has had some bilateral lower extremity edema for the last few days as well as frothy sputum production.  On exam, patient is in no acute distress.  Respiratory rate is normal.  She is satting well on room air.  Her heart rate is normal.  Lungs are generally clear to auscultation but she has a few rare wheezes in the left upper lobe.  No rales or rhonchi.  Trace pitting edema noted to the bilateral lower extremities.  Chest x-ray and EKG ordered in triage reviewed. EKG shows normal sinus rhythm, unchanged from prior  Care transitioned to Dr. Darl Householder at shift change, pending labs. Anticipate if labwork is normal then patient would be appropriate for outpt f/u. She may benefit from short course of lasix.   ED Discharge Orders    None       Bishop Dublin 12/05/18 1534    Fredia Sorrow, MD 12/07/18 [phone]

## 2018-12-05 NOTE — ED Triage Notes (Signed)
Pt reports she is having "breathing issues". 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  Pt reports she previously had UTI, Enlarged heart and breathing issues

## 2018-12-07 ENCOUNTER — Telehealth: Payer: Self-pay

## 2018-12-07 NOTE — Telephone Encounter (Signed)
lmom for prescreen  

## 2018-12-08 ENCOUNTER — Telehealth (HOSPITAL_COMMUNITY): Payer: Self-pay | Admitting: *Deleted

## 2018-12-08 NOTE — Telephone Encounter (Signed)
Patient given detailed instructions per Myocardial Perfusion Study Information Sheet for the test on 12/13/18 at 7:45. Patient notified to arrive 15 minutes early and that it is imperative to arrive on time for appointment to keep from having the test rescheduled.  If you need to cancel or reschedule your appointment, please call the office within 24 hours of your appointment. . Patient verbalized understanding.Beth Macias

## 2018-12-10 ENCOUNTER — Ambulatory Visit (INDEPENDENT_AMBULATORY_CARE_PROVIDER_SITE_OTHER): Payer: Medicare Other | Admitting: Pharmacist

## 2018-12-10 ENCOUNTER — Other Ambulatory Visit: Payer: Self-pay

## 2018-12-10 ENCOUNTER — Other Ambulatory Visit: Payer: Medicare Other | Admitting: *Deleted

## 2018-12-10 ENCOUNTER — Encounter: Payer: Self-pay | Admitting: Pharmacist

## 2018-12-10 VITALS — BP 122/70 | HR 67 | Wt 225.0 lb

## 2018-12-10 DIAGNOSIS — I5022 Chronic systolic (congestive) heart failure: Secondary | ICD-10-CM | POA: Diagnosis not present

## 2018-12-10 DIAGNOSIS — I5020 Unspecified systolic (congestive) heart failure: Secondary | ICD-10-CM

## 2018-12-10 DIAGNOSIS — I509 Heart failure, unspecified: Secondary | ICD-10-CM | POA: Insufficient documentation

## 2018-12-10 LAB — COMPREHENSIVE METABOLIC PANEL
ALT: 15 IU/L (ref 0–32)
AST: 18 IU/L (ref 0–40)
Albumin/Globulin Ratio: 2.2 (ref 1.2–2.2)
Albumin: 4.2 g/dL (ref 3.8–4.8)
Alkaline Phosphatase: 70 IU/L (ref 39–117)
BUN/Creatinine Ratio: 17 (ref 12–28)
BUN: 15 mg/dL (ref 8–27)
Bilirubin Total: 0.8 mg/dL (ref 0.0–1.2)
CO2: 26 mmol/L (ref 20–29)
Calcium: 9.6 mg/dL (ref 8.7–10.3)
Chloride: 101 mmol/L (ref 96–106)
Creatinine, Ser: 0.87 mg/dL (ref 0.57–1.00)
GFR calc Af Amer: 79 mL/min/{1.73_m2} (ref >59)
GFR calc non Af Amer: 68 mL/min/{1.73_m2} (ref >59)
Globulin, Total: 1.9 g/dL (ref 1.5–4.5)
Glucose: 160 mg/dL — ABNORMAL HIGH (ref 65–99)
Potassium: 3.7 mmol/L (ref 3.5–5.2)
Sodium: 141 mmol/L (ref 134–144)
Total Protein: 6.1 g/dL (ref 6.0–8.5)

## 2018-12-10 MED ORDER — FUROSEMIDE 40 MG PO TABS: 40.0000 mg | ORAL_TABLET | Freq: Every day | ORAL | 11 refills | Status: DC

## 2018-12-10 MED ORDER — POTASSIUM CHLORIDE ER 10 MEQ PO TBCR: 10.0000 meq | EXTENDED_RELEASE_TABLET | Freq: Every day | ORAL | 11 refills | Status: DC

## 2018-12-10 NOTE — Progress Notes (Signed)
Patient ID: Beth Macias                 DOB: 1950/08/10                      MRN: [id number]     HPI: Beth Macias is a 68 y.o. female patient of Dr. Kyla Balzarine referred by Richardson Dopp, PA to CVRR clinic. PMH is significant for chronic systolic CHF, hypertension, hyperlipidemia, diabetes mellitus, morbid obesity, and asthma. Patient last echo showed an EF of 30-35%. She was initially reluctant to start medications. She eventually agreed to start Entresto 26-26 mg twice a day a carvedilol 3.125mg  twice a day on 11/24/2018. She did not want to take lasix daily, but agreed to take prn. She was seen in the ED on 12/05/2018 for shortness of breath and was started on furosemide 40mg  daily. BNP at the hospital was 2,464.   Patient presents today for follow up. She reports some improvement from Sunday when she was in the hospital. She states she has lost 4lb of water weight since the hospital. She is down 2 lb from her visit with Nicki Reaper on 7/1. Is taking her lasix daily. She does not have any shortness of breath at rest and can do some cooking without getting short of breath. She does still get short of breath with exertion. Cannot walk up and down the steps to do laundry more than a few times.  She sleeps on an incline due to GERD. She complains of urgency and frequency of urination. Most likely due to the lasix, but does state is has been going on for about a month. Takes cranberry tablets.  Is very edgar to get this (CHF) under control and enjoy her retirement.  Patient states great appreciation for our time spent with her in discussing her medications and condition.    Current HTN meds: Entresto 24/26mg  BID, carvedilol 3.125mg  BID, furosemide 40mg  daily  Family History: The patient's family history includes AAA (abdominal aortic aneurysm) in her father; Colon cancer in her maternal uncle; Crohn's disease in her paternal aunt; Diabetes in her mother; Gout in her brother and mother;  Heart attack in her brother; Heart failure in her father and mother; Hypertension in her brother, father, and mother.  Social History: former smoker, neg ETOH  Diet: On a meal plan to lose weight- counts her Na intake  Exercise: cannot really exert herself without SOB at this time but is very eager to start  Home BP readings:   Wt Readings from Last 3 Encounters:  11/24/18 227 lb (103 kg)  08/02/18 227 lb (103 kg)  07/05/18 225 lb (102.1 kg)   BP Readings from Last 3 Encounters:  12/05/18 (!) 148/85  11/24/18 134/76  08/02/18 120/74   Pulse Readings from Last 3 Encounters:  12/05/18 85  11/24/18 86  08/02/18 68    Renal function: CrCl cannot be calculated (Unknown ideal weight.).  Past Medical History:  Diagnosis Date  . Asthma   . Blood in stool   . Colon polyps   . Diabetes mellitus without complication ([place])   . Diverticulitis   . Fibromyalgia   . GERD (gastroesophageal reflux disease)   . Gout   . Hypercholesteremia   . Hypertension   . Pneumonia   . Pneumonia   . UTI (urinary tract infection)     Current Outpatient Medications on File Prior to Visit  Medication Sig Dispense Refill  . carvedilol (COREG) 3.125 MG  tablet Take 1 tablet (3.125 mg total) by mouth 2 (two) times daily. 180 tablet 3  . cetirizine (ZYRTEC) 10 MG tablet Take 10 mg by mouth daily.    . Cholecalciferol (VITAMIN D3) 125 MCG (5000 UT) CAPS Take 1,000 Units by mouth at bedtime.     . Fluticasone-Salmeterol (WIXELA INHUB) 250-50 MCG/DOSE AEPB Inhale 1 puff into the lungs 2 (two) times daily.    . furosemide (LASIX) 40 MG tablet Take 1 tablet (40 mg total) by mouth as needed (AS NEED FOR WEIGTH GAIN 3LBS IN 24 HOURS 5LBS IN A WEEK). 30 tablet 3  . furosemide (LASIX) 40 MG tablet Take 1 tablet (40 mg total) by mouth daily. 7 tablet 0  . glucose blood (ONETOUCH ULTRA) test strip Use to check blood sugar 4-5 times daily 50 each 5  . montelukast (SINGULAIR) 10 MG tablet Take 10 mg by mouth  daily.    Marland Kitchen omeprazole (PRILOSEC) 20 MG capsule Take 20 mg by mouth daily.    . potassium chloride (K-DUR) 10 MEQ tablet Take 1 tablet (10 mEq total) by mouth as needed (TAKE WHEN TAKING LASIX). 30 tablet 3  . PROAIR HFA 108 (90 Base) MCG/ACT inhaler Inhale 2 puffs into the lungs every 4 (four) hours as needed for wheezing.   9  . sacubitril-valsartan (ENTRESTO) 24-26 MG Take 1 tablet by mouth 2 (two) times daily. 60 tablet 6   No current facility-administered medications on file prior to visit.     Allergies  Allergen Reactions  . Penicillins Itching    Has patient had a PCN reaction causing immediate rash, facial/tongue/throat swelling, SOB or lightheadedness with hypotension: No Has patient had a PCN reaction causing severe rash involving mucus membranes or skin necrosis: No Has patient had a PCN reaction that required hospitalization: Unknown Has patient had a PCN reaction occurring within the last 10 years: No If all of the above answers are "NO", then may proceed with Cephalosporin use.  . Tape Other (See Comments)    Blisters  . Gabapentin Itching, Rash and Other (See Comments)    Incoherent    There were no vitals taken for this visit.   Assessment/Plan:  1. CHF medication titration- Blood pressure is adequate to increase Entresto to 49/51mg  twice a day. Will await blood work to result before officially increasing. I will call patient once they result. Patient did state she wishes to wait until she is done her supply of Entresto 24/26mg  before increasing to 49/51mg . She has about 2 weeks left. Will plan to check a BMP and Mg at next visit with PharmD. Weight appears to be slowly decreasing. Will continue furosemide 40mg  daily. Patient was educated on the importance of Entresto and carvedilol on helping her feel better, live longer and stay out of the hospital. Educated on the importance of using furosemide to help remove the excess fluid and keep it off. Understands that we will  continue furosemide 40mg  daily for now and will decrease if able once she is "dry." Increasing Entresto should help with this as well.  Patient already keeps a food log and tracks her sodium. She appears to be consuming a low Na diet. Stating yesterday she ate just over a 1000mg  of Na. I encouraged her to keep up the good work. She is to follow up with Dr. Johnsie Cancel on 7/31 and then again with [place] on 8/14. She was provided with our number to call if she has a weight gain of 3 or more  lb overnight or 5lb in a week.   Thank you  Ramond Dial, Pharm.D, [facility]  [address] [place], [place] [address]  Phone: [phone]; Fax: [phone]

## 2018-12-10 NOTE — Patient Instructions (Addendum)
It was a pleasure to meet you today. I will call you with your lab results on Tuesday. We will plan to increase Entresto to 49/51mg  twice a day. Continue carvedilol 3.125mg  twice a day and furosemide 40mg  daily. Continue weighing yourself once a day. Call us with a weight gain of three or more pounds overnight or 5lb in one week Continue your low sodium diet. You are doing a great job! Follow up with Dr. Johnsie Cancel on 7/31 and with [place] on 8/14. Call us at [phone] with any questions or concerns

## 2018-12-13 ENCOUNTER — Other Ambulatory Visit: Payer: Self-pay

## 2018-12-13 ENCOUNTER — Ambulatory Visit (HOSPITAL_COMMUNITY): Payer: Medicare Other | Attending: Cardiovascular Disease

## 2018-12-13 DIAGNOSIS — I5022 Chronic systolic (congestive) heart failure: Secondary | ICD-10-CM | POA: Diagnosis present

## 2018-12-13 LAB — MYOCARDIAL PERFUSION IMAGING
LV dias vol: 197 mL (ref 46–106)
LV sys vol: 135 mL
Peak HR: 93 {beats}/min
Rest HR: 83 {beats}/min
SDS: 3
SRS: 4
SSS: 7
TID: 1.09

## 2018-12-13 MED ORDER — TECHNETIUM TC 99M TETROFOSMIN IV KIT
10.9000 | PACK | Freq: Once | INTRAVENOUS | Status: AC | PRN
  Administered 2018-12-13: 10.9 via INTRAVENOUS
  Filled 2018-12-13: qty 11

## 2018-12-13 MED ORDER — REGADENOSON 0.4 MG/5ML IV SOLN
0.4000 mg | Freq: Once | INTRAVENOUS | Status: AC
  Administered 2018-12-13: 0.4 mg via INTRAVENOUS

## 2018-12-13 MED ORDER — TECHNETIUM TC 99M TETROFOSMIN IV KIT
32.5000 | PACK | Freq: Once | INTRAVENOUS | Status: AC | PRN
  Administered 2018-12-13: 32.5 via INTRAVENOUS
  Filled 2018-12-13: qty 33

## 2018-12-14 ENCOUNTER — Other Ambulatory Visit: Payer: Self-pay | Admitting: Pharmacist

## 2018-12-14 MED ORDER — ENTRESTO 49-51 MG PO TABS: 1.0000 | ORAL_TABLET | Freq: Two times a day (BID) | ORAL | 11 refills | Status: DC

## 2018-12-14 NOTE — Telephone Encounter (Signed)
Sorry, I signed the Rx- sent the note as an fyi before I signed the order.

## 2018-12-14 NOTE — Telephone Encounter (Signed)
Ok to fill entresto didn't give me option to sign anything

## 2018-12-14 NOTE — Telephone Encounter (Signed)
Called patient to review labs. Labs are stable. Glucose elevated, but patient refuses to take her metformin. Is focusing on diet and exercise. Although she has to get her breathing under control/ cooler weather before she can focus on exercise.  Will continue with previous plan on increasing Entresto to 49/51mg  BID. Patient will increase once she finishes her supply on 24/26mg . Continue furosemide 40mg  daily. Weight has been stable. Follow up with Dr. Johnsie Cancel on Friday 8/31 and then with [place] again on 8/14.

## 2018-12-20 ENCOUNTER — Telehealth: Payer: Self-pay | Admitting: Pharmacist

## 2018-12-20 DIAGNOSIS — R0602 Shortness of breath: Secondary | ICD-10-CM

## 2018-12-20 MED ORDER — SPIRONOLACTONE 25 MG PO TABS: 25.0000 mg | ORAL_TABLET | Freq: Every day | ORAL | 3 refills | Status: DC

## 2018-12-20 NOTE — Telephone Encounter (Signed)
° °  Pt c/o medication issue:  1. Name of Medication:  carvedilol (COREG) 3.125 MG tablet sacubitril-valsartan (ENTRESTO) 49-51 MG  2. How are you currently taking this medication (dosage and times per day)? As directed  3. Are you having a reaction (difficulty breathing--STAT)? Only when laying down  4. What is your medication issue? Patient states she is having some sort of reaction.She has a heavy feeling in her chest, and she is unable to breathe when she lays downShe has a heavines She has been unable to sleep and has some fluid in her ankles

## 2018-12-20 NOTE — Telephone Encounter (Signed)
Spoke with pt for quite some time and pt is feeling like she did previous to starting Carvedilol and Entresto Per pt this started Sat night not able to lay down without feeling chest heaviness ,ankles swelling, and  SOB.Pt states with Furosemide she is up between 3:00 -5:00  am urinating and is not able to rest Also pt's weight is up about 3 lbs Pt also is taking a inhaler with no relief Will forward to [facility] D and Dr Johnsie Cancel for review. Carvedilol 3.125 mg bid Entresto 24/26 mg Furosemide 40 mg  Last B/P was 130/80 and HR 83 pt does not check regularly

## 2018-12-20 NOTE — Telephone Encounter (Signed)
Have her seen by PA or CHF clinic this week Needs f/u BMET/BNP and CXR for CHF Add aldactone 25 mg daily

## 2018-12-20 NOTE — Telephone Encounter (Signed)
I thought I was doing virtual visits on Friday my quarter day Off ??

## 2018-12-20 NOTE — Telephone Encounter (Signed)
Pt aware of Dr Kyla Balzarine recommendations and will have labs and CXR done on Thursday and will keep appt with Dr Johnsie Cancel on Friday./cy

## 2018-12-20 NOTE — Telephone Encounter (Signed)
Your schedule shows 5 office visits and 2 virtual visits on Friday ./cy

## 2018-12-20 NOTE — Telephone Encounter (Signed)
Would recommend she take 40mg  of furosemide first thing in the AM and another 20mg  around 3PM. Agree with adding spironolatone 25mg  daily. Labs for the 17th were stable. Should repeat BMP, BNP later this week. Dr. Johnsie Cancel, patient has appointment with you on Friday. Do you want [place] or PA to see her before then?

## 2018-12-21 NOTE — Progress Notes (Signed)
Cardiology Office Note   Date:  12/21/2018   ID:  Rosanna Bickle, DOB 1951-03-12, MRN [id number]  PCP:  Billie Ruddy, MD  Cardiologist:   Jenkins Rouge, MD   No chief complaint on file.     History of Present Illness: Beth Macias is a 68 y.o. female who was first seen January 23,2020   for consultation regarding chest pain. Referred by Lennette Bih[facility]  Reviewed ER visit notes from 04/07/18  Lots of URI, congestive symptoms  And urinary frequency. Chest felt tight after a few days. Previously diagnosed with bone spur on left shoulder pain  Radiated from here down left arm Describes pain as electric shock occurs spontaneously , non exertional and last Seconds to minutes then recurs Started on Bactrim  CRF;s include DM, HLD and HTN. Morbidly obese with asthma BP was elevated in ER Had abnormal ECG with no old one to compare  Reviewed ECG from 04/07/18 SR PVC isolated LAD and poor R wave progression no acute ST changes Troponin negative x 2 CXR with CE ligular airspace disease could Be scarring , atelectasis or early pneumonia. F/U CXR done 04/26/18 NAD.    She has 3 daughters One is a PA, one a physician and on does HR. She is originally from [place] now Sedentary Activity limited by dyspnea and sciatica   Echo done 11/16/18 reviewed EF 30-35% with moderate MR Small to moderate sized pericardial effusion Myovue done 12/13/18 normal perfusion EF 32%  She was reluctant to start meds. Has had f/u with PA on 11/24/18 Noted reluctance to be on multiple medicines like mother who died Patient has remote smoking history but about 40 years ago in her mid 18's. She denies any alcohol or recreational drug use. Patient does have a family history of CAD with brother having a MI in his 44's and her father having a MI in his 22's, ultimately requiring CABG x4.   Amlodipine d/c She was reluctant to take daily lasix Continued  to have dyspnea, edema PND and orthopnea. Encouraged to take entresto, lasix 40 am 20 pm coreg and aldactone Labs 12/10/18 showed Cr 0.87 K 3.7 BNP 2464 Seen in ER 12/05/18 offered admission and declined   Seen by Pharm D and entresto increased to 49/51 mg and encouraged to take lasix   Apparent on visit today a lot of her dyspnea is related to asthma and obesity as well. Discussed need to f/u with pulmonary She use to see Dr Melvyn Novas and did not like his personality   Past Medical History:  Diagnosis Date  . Asthma   . Blood in stool   . Colon polyps   . Diabetes mellitus without complication ([place])   . Diverticulitis   . Fibromyalgia   . GERD (gastroesophageal reflux disease)   . Gout   . Hypercholesteremia   . Hypertension   . Pneumonia   . Pneumonia   . UTI (urinary tract infection)     Past Surgical History:  Procedure Laterality Date  . APPENDECTOMY    . CESAREAN SECTION     x 3  . LIPOMA EXCISION    . MENISCUS REPAIR Left   . OVARIAN CYST REMOVAL    . OVARIAN CYST SURGERY       Current Outpatient Medications  Medication Sig Dispense Refill  . carvedilol (COREG) 3.125 MG tablet Take 1 tablet (3.125 mg total) by mouth 2 (two) times  daily. 180 tablet 3  . cetirizine (ZYRTEC) 10 MG tablet Take 10 mg by mouth daily.    . Cholecalciferol (VITAMIN D3) 125 MCG (5000 UT) CAPS Take 1,000 Units by mouth at bedtime.     . Fluticasone-Salmeterol (WIXELA INHUB) 250-50 MCG/DOSE AEPB Inhale 1 puff into the lungs 2 (two) times daily.    . furosemide (LASIX) 40 MG tablet Take 1 tablet (40 mg total) by mouth daily. 30 tablet 11  . glucose blood (ONETOUCH ULTRA) test strip Use to check blood sugar 4-5 times daily 50 each 5  . montelukast (SINGULAIR) 10 MG tablet Take 10 mg by mouth daily.    Marland Kitchen omeprazole (PRILOSEC) 20 MG capsule Take 20 mg by mouth daily.    . potassium chloride (K-DUR) 10 MEQ tablet Take 1 tablet (10 mEq total) by mouth daily. 30 tablet 11  . PROAIR HFA 108 (90 Base)  MCG/ACT inhaler Inhale 2 puffs into the lungs every 4 (four) hours as needed for wheezing.   9  . sacubitril-valsartan (ENTRESTO) 49-51 MG Take 1 tablet by mouth 2 (two) times daily. 60 tablet 11  . spironolactone (ALDACTONE) 25 MG tablet Take 1 tablet (25 mg total) by mouth daily. 30 tablet 3   No current facility-administered medications for this visit.     Allergies:   Penicillins, Tape, and Gabapentin    Social History:  The patient  reports that she quit smoking about 42 years ago. Her smoking use included cigarettes. She has a 2.25 pack-year smoking history. She has never used smokeless tobacco. She reports that she does not drink alcohol or use drugs.   Family History:  The patient's family history includes AAA (abdominal aortic aneurysm) in her father; Colon cancer in her maternal uncle; Crohn's disease in her paternal aunt; Diabetes in her mother; Gout in her brother and mother; Heart attack in her brother; Heart failure in her father and mother; Hypertension in her brother, father, and mother.    ROS:  Please see the history of present illness.   Otherwise, review of systems are positive for none.   All other systems are reviewed and negative.    PHYSICAL EXAM: VS:  There were no vitals taken for this visit. , BMI There is no height or weight on file to calculate BMI. Affect appropriate Healthy:  appears stated age 68: normal Neck supple with no adenopathy JVP normal no bruits no thyromegaly Lungs clear with no wheezing and good diaphragmatic motion Heart:  S1/S2 MR  murmur, no rub, gallop or click PMI enlarged  Abdomen: benighn, BS positve, no tenderness, no AAA no bruit.  No HSM or HJR Distal pulses intact with no bruits Plus one bilateral edema Neuro non-focal Skin warm and dry No muscular weakness    EKG:  7/13 SR low voltage LAD ? Old IMI/AMI   Recent Labs: 12/05/2018: B Natriuretic Peptide 2,464.6; Hemoglobin 13.7; Platelets 274 12/10/2018: ALT 15; BUN 15;  Creatinine, Ser 0.87; Potassium 3.7; Sodium 141    Lipid Panel No results found for: CHOL, TRIG, HDL, CHOLHDL, VLDL, LDLCALC, LDLDIRECT    Wt Readings from Last 3 Encounters:  12/13/18 227 lb (103 kg)  12/10/18 225 lb (102.1 kg)  11/24/18 227 lb (103 kg)      Other studies Reviewed:  myovue 12/13/18  Echo 11/16/18     ASSESSMENT AND PLAN:  1. Chest Pain:  Very atypical for cardiac disease. Abnormal ECG related to HTN and body habitus. Multiple CRF;s may need right and left  heart ctah in future  Myovue 12/13/18 non ischemic  2. HTN: Well controlled.  Continue current medications and low sodium Dash type diet.   3. HLD :  No lipids on file f/u primary  4. DM:  Discussed low carb diet.  Target hemoglobin A1c is 6.5 or less.  Continue current medications. 5. Ortho:  Encouraged her to f/u with MRI scan of shoulder 6. DCM:  Started in November with URI. 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 Perfusion normal on myovue 12/13/18  With need for repeat echo for pericardial effusion. Ideally would have right and left cath after optimizing CHF meds which has been a challenge. Testing delayed due to COVID and patient has not wished to be on multiple meds Aldactone just added See Pharm D 3 weeks to go up to max dose entresto HR 54 cannot titrate coreg more.  7. Asthma: tight today will arrange PFT;s pre/post bronchodilator and [place] pulmonary consult may benefit from steroids. Dyspne multifactorial with obesity, asthma and CHF   Current medicines are reviewed at length with the patient today.  The patient does not have concerns regarding medicines.  The following changes have been made: Titrate Entresto 3 weeks   Labs/ tests ordered today include: PFT;s  No orders of the defined types were placed in this encounter.    Disposition:   FU with cardiology in 4 weeks     Signed, Jenkins Rouge, MD  12/21/2018 3:02 PM    [facility] [place], [place], [place]  [address] Phone: [phone]; Fax: [phone]

## 2018-12-22 ENCOUNTER — Telehealth: Payer: Self-pay | Admitting: Nurse Practitioner

## 2018-12-22 NOTE — Telephone Encounter (Signed)
Called patient earlier today regarding her appointment on Friday 7/31 Patient states she really feels that she needs to speak with Dr. Johnsie Cancel face to face due to all the recent changes with her CHF. I advised that I will need to verify that he can see her in person on Friday, which he confirmed. I called patient and advised her that appointments will remain as planned with lab work on Thursday 7/30 and appointment with Dr. Johnsie Cancel Friday 7/31. She verbalized understanding and agreement and thanked me for the call.

## 2018-12-23 ENCOUNTER — Other Ambulatory Visit: Payer: Medicare Other

## 2018-12-23 ENCOUNTER — Telehealth: Payer: Self-pay | Admitting: *Deleted

## 2018-12-23 ENCOUNTER — Other Ambulatory Visit: Payer: Self-pay

## 2018-12-23 ENCOUNTER — Ambulatory Visit
Admission: RE | Admit: 2018-12-23 | Discharge: 2018-12-23 | Disposition: A | Discharge status: Home / Self Care | Payer: Medicare Other | Source: Ambulatory Visit | Attending: Cardiovascular Disease | Admitting: Cardiovascular Disease

## 2018-12-23 DIAGNOSIS — R0602 Shortness of breath: Secondary | ICD-10-CM

## 2018-12-23 LAB — BASIC METABOLIC PANEL
BUN/Creatinine Ratio: 18 (ref 12–28)
BUN: 16 mg/dL (ref 8–27)
CO2: 24 mmol/L (ref 20–29)
Calcium: 9.5 mg/dL (ref 8.7–10.3)
Chloride: 100 mmol/L (ref 96–106)
Creatinine, Ser: 0.88 mg/dL (ref 0.57–1.00)
GFR calc Af Amer: 78 mL/min/{1.73_m2} (ref >59)
GFR calc non Af Amer: 67 mL/min/{1.73_m2} (ref >59)
Glucose: 158 mg/dL — ABNORMAL HIGH (ref 65–99)
Potassium: 4 mmol/L (ref 3.5–5.2)
Sodium: 141 mmol/L (ref 134–144)

## 2018-12-23 LAB — PRO B NATRIURETIC PEPTIDE: NT-Pro BNP: 3757 pg/mL — ABNORMAL HIGH (ref 0–301)

## 2018-12-23 NOTE — Telephone Encounter (Signed)
Called to prescreen, no answer.  I did not leave a message.      COVID-19 Pre-Screening Questions:  . In the past 7 to 10 days have you had a cough,  shortness of breath, headache, congestion, fever (100 or greater) body aches, chills, sore throat, or sudden loss of taste or sense of smell? . Have you been around anyone with known Covid 19. . Have you been around anyone who is awaiting Covid 19 test results in the past 7 to 10 days? . Have you been around anyone who has been exposed to Covid 19, or has mentioned symptoms of Covid 19 within the past 7 to 10 days?  If you have any concerns/questions about symptoms patients report during screening (either on the phone or at threshold). Contact the provider seeing the patient or DOD for further guidance.  If neither are available contact a member of the leadership team.

## 2018-12-23 NOTE — Telephone Encounter (Signed)
Called pt re:  covid19 prescreen questions below:      COVID-19 Pre-Screening Questions: Pt states she does have congestion that has started this month, July, and some sob, but sob is not new.  She believes it is either lung / heart or allergy related and that she was getting a xray today for Dr. Johnsie Cancel.  Per protocol, will route to covering RN for Dr. Johnsie Cancel for tomorrow, 12/24/2018. To make aware.  . In the past 7 to 10 days have you had a cough,  shortness of breath, headache, congestion, fever (100 or greater) body aches, chills, sore throat, or sudden loss of taste or sense of smell? . Have you been around anyone with known Covid 19. . Have you been around anyone who is awaiting Covid 19 test results in the past 7 to 10 days? . Have you been around anyone who has been exposed to Covid 19, or has mentioned symptoms of Covid 19 within the past 7 to 10 days?  If you have any concerns/questions about symptoms patients report during screening (either on the phone or at threshold). Contact the provider seeing the patient or DOD for further guidance.  If neither are available contact a member of the leadership team.

## 2018-12-24 ENCOUNTER — Telehealth: Payer: Self-pay | Admitting: Internal Medicine

## 2018-12-24 ENCOUNTER — Encounter: Payer: Self-pay | Admitting: Cardiovascular Disease

## 2018-12-24 ENCOUNTER — Ambulatory Visit (INDEPENDENT_AMBULATORY_CARE_PROVIDER_SITE_OTHER): Payer: Medicare Other | Admitting: Cardiovascular Disease

## 2018-12-24 VITALS — BP 122/90 | HR 54 | Ht 60.0 in | Wt 234.1 lb

## 2018-12-24 DIAGNOSIS — E118 Type 2 diabetes mellitus with unspecified complications: Secondary | ICD-10-CM

## 2018-12-24 DIAGNOSIS — I5022 Chronic systolic (congestive) heart failure: Secondary | ICD-10-CM

## 2018-12-24 DIAGNOSIS — J4540 Moderate persistent asthma, uncomplicated: Secondary | ICD-10-CM | POA: Diagnosis not present

## 2018-12-24 NOTE — Addendum Note (Signed)
Addended by: Emmaline Life on: 12/24/2018 09:15 AM   Modules accepted: Orders

## 2018-12-24 NOTE — Telephone Encounter (Signed)
Spoke with the pt and scheduled appt with VS for 12/30/2018 at 9:00

## 2018-12-24 NOTE — Telephone Encounter (Signed)
Okay to have patient switched to me.  Please schedule her for earliest appointment with me.  Okay to double book visit if needed.

## 2018-12-24 NOTE — Patient Instructions (Addendum)
Medication Instructions:  Your physician recommends that you continue on your current medications as directed. Please refer to the Current Medication list given to you today.  If you need a refill on your cardiac medications before your next appointment, please call your pharmacy.    Lab work: None Ordered    Testing/Procedures: Your physician has recommended that you have a pulmonary function test. Pulmonary Function Tests are a group of tests that measure how well air moves in and out of your lungs.    Follow-Up: Your physician recommends that you return for a follow-up appointment on August 14 at 9:15 with Pharm D for titration of Entresto  You have been referred to Medical Weight Management    You have been referred to [facility] for management of your asthma You will receive a call from them for an appointment  Your physician recommends that you have a virtual visit  for a follow-up appointment on September 16 at 9:30 am with Dr. Johnsie Cancel

## 2018-12-24 NOTE — Telephone Encounter (Signed)
Pt had a cardiology visit today with Dr. Johnsie Cancel.   Saw a note from that [place] that pt presented with a lot of dyspnea related to her asthma and obesity and that it was discussed for pt to schedule f/u with pulmonary but pt wanted to switch MDs from [facility] to a different provider.  Message received from Hyman Hopes is that Dr. Johnsie Cancel wanted to speak with Dr. Halford Chessman in regards to pt.  Called [facility] and spoke with Dr. Kyla Balzarine nurse Sharyn Lull. Per Sharyn Lull, pt has been recently diagnosed with heart failure but Dr. Johnsie Cancel felt that a lot of pt's symptoms have been due to her asthma. Sharyn Lull stated that pt is not wanting to see MW anymore at the office for appts and Dr. Johnsie Cancel is wanting to speak with Dr. Halford Chessman directly in regards to pt.  Sharyn Lull said that Dr. Halford Chessman can reach Dr. Johnsie Cancel on his cell at [phone]. Routing to Dr. Halford Chessman.

## 2018-12-27 ENCOUNTER — Other Ambulatory Visit: Payer: Self-pay | Admitting: Cardiovascular Disease

## 2018-12-30 ENCOUNTER — Encounter: Payer: Self-pay | Admitting: Pulmonary Disease

## 2018-12-30 ENCOUNTER — Ambulatory Visit (INDEPENDENT_AMBULATORY_CARE_PROVIDER_SITE_OTHER): Payer: Medicare Other | Admitting: Pulmonary Disease

## 2018-12-30 ENCOUNTER — Other Ambulatory Visit: Payer: Self-pay

## 2018-12-30 VITALS — BP 126/78 | HR 86 | Temp 98.6°F | Ht 60.0 in | Wt 232.0 lb

## 2018-12-30 DIAGNOSIS — J4550 Severe persistent asthma, uncomplicated: Secondary | ICD-10-CM | POA: Diagnosis not present

## 2018-12-30 DIAGNOSIS — R058 Other specified cough: Secondary | ICD-10-CM

## 2018-12-30 DIAGNOSIS — R05 Cough: Secondary | ICD-10-CM | POA: Diagnosis not present

## 2018-12-30 DIAGNOSIS — K219 Gastro-esophageal reflux disease without esophagitis: Secondary | ICD-10-CM

## 2018-12-30 DIAGNOSIS — B3324 Viral cardiomyopathy: Secondary | ICD-10-CM

## 2018-12-30 DIAGNOSIS — I3139 Other pericardial effusion (noninflammatory): Secondary | ICD-10-CM

## 2018-12-30 DIAGNOSIS — I313 Pericardial effusion (noninflammatory): Secondary | ICD-10-CM

## 2018-12-30 DIAGNOSIS — R0683 Snoring: Secondary | ICD-10-CM

## 2018-12-30 MED ORDER — FLUTICASONE PROPIONATE 50 MCG/ACT NA SUSP: 1.0000 | Freq: Every day | NASAL | 5 refills | Status: DC

## 2018-12-30 MED ORDER — BUDESONIDE-FORMOTEROL FUMARATE 80-4.5 MCG/ACT IN AERO: 2.0000 | INHALATION_SPRAY | Freq: Two times a day (BID) | RESPIRATORY_TRACT | 11 refills | Status: DC

## 2018-12-30 MED ORDER — OMEPRAZOLE 40 MG PO CPDR: 40.0000 mg | DELAYED_RELEASE_CAPSULE | Freq: Every day | ORAL | 2 refills | Status: DC

## 2018-12-30 NOTE — Patient Instructions (Signed)
Working diagnoses for your breathing problems are the following: Chronic rhinitis with post nasal drip causing upper airway cough syndrome, Acid reflux with laryngopharyngeal reflux, asthma, sleep apnea, and viral cardiomyopathy with pericardial effusion  Treatment plan: Saline rinse daily Fluticasone nasal spray, one spray in each nostril daily Zyrtec 10 mg pill daily Singulair 10 mg pill nightly Stop using wixela Symbicort two puffs in the morning and two puffs at night, and rinse mouth after each use; use with spacer device Omeprazole (prilosec) 40 mg daily, and take 30 minutes before your first meal of the day Sip water when you have the urge to cough Avoid clearing your throat or forcing a cough Use sugarless candy to keep your mouth moist Salt water gargles one or two times per day  Tests to be scheduled: Home sleep study  Follow up in 2 weeks with Dr. Halford Chessman or Nurse Practitioner

## 2018-12-30 NOTE — Progress Notes (Signed)
[name]  Chief Complaint  Patient presents with  . Follow-up    former MW patient, complaining of mostly non productive cough, occ white, yellow, thick, foam sputum, having fatigue, sob    Constitutional:  BP 126/78 (BP Location: Left Arm, Cuff Size: Normal)   Pulse 86   Temp 98.6 F (37 C) (Oral)   Ht 5' (1.524 m)   Wt 232 lb (105.2 kg)   SpO2 99%   BMI 45.31 kg/m   Past Medical History:  PNA, HTN, HLD, Gout, GERD, Fibromyalgia, Diverticulitis, DM, Colon polyps, dilated cardiomyopathy  Brief Summary:  Beth Macias is a 68 y.o. female former smoker with dyspnea.  She is a retired Psychologist, prison and probation services.  Was living in [place] area.   Moved to [place] couple of years ago.  Has history of allergies and dx with asthma at age 51.  Was seen by pulmonary at [facility] previously.  Allergy symptoms got worse after move to [place].  She has allergy to mold, dust mites, tree and grass pollen.  Also allergic to dogs.  She quit smoking years ago.  Eosinophil count from 03/04/16 was 260.  She developed a respiratory infection in November 2019.  Was treated for pneumonia.  She was later found to have enlarged heart.  This was related to new onset systolic CHF and pericardial effusion.  Seen by Dr. Johnsie Cancel with cardiology, and concern for viral cardiomyopathy.  Her asthma also has gotten worse since then.  She has wheeze, sinus congestion, post nasal drip, globus, throat clearing, cough, sputum, and dyspnea on exertion.  Also has snoring, sleep disruption, and daytime sleepiness.  Her sister has sleep apnea.  She has two children who are doctors (urology and pediatrics).  Physical Exam:   Appearance - well kempt   ENMT - clear nasal mucosa, midline nasal  septum, no oral exudates, no LAN, trachea midline, wheeze from throat  Respiratory - normal chest wall, normal respiratory effort, no accessory muscle use, no wheeze/rales  CV - s1s2  regular rate and rhythm, no murmurs, no peripheral edema, radial pulses symmetric  GI - soft, non tender, no masses  Lymph - no adenopathy noted in neck and axillary areas  MSK - normal gait  Ext - no cyanosis, clubbing, or joint inflammation noted  Skin - no rashes, lesions, or ulcers  Neuro - normal strength, oriented x 3  Psych - normal mood and affect  Discussion:  She has multifactorial dyspnea.  She has prior history of asthma.  Seems that she had respiratory infection in November 2019.  This resulted in viral cardiomyopathy, pericardial effusion, worsening asthma.  She also has chronic rhinitis with postnasal drip and reflux contributing to upper airway irritation and wheezing.  Finally she has symptoms suggestive of sleep apnea.  Assessment/Plan:   Upper airway cough syndrome. - nasal irrigation and flonase daily - zyrtec in the morning and singulair at night - sip water with urge to cough - sugarless candy to keep mouth moist - salt water gargles prn  Moderate, persistent asthma. - d/c wixela as this is likely contributing to upper airway irritation - symbicort 80/4.5 two puffs bid, and use with spacer - prn albuterol - continue singulair  Laryngopharyngeal reflux. - change to omeprazole 40 mg daily for now  Snoring with sleep disruption and witnessed apnea. - will arrange for home sleep study  Viral cardiomyopathy with pericardial effusion. - followed by Dr. Johnsie Cancel with cardiology - f/u Echo pending  Obesity. - would benefit from a monitored exercise program to assist with conditioning and weight loss once her other conditions are more stable    Patient Instructions  Working diagnoses for your breathing problems are the following: Chronic rhinitis with post nasal drip causing upper airway cough syndrome, Acid reflux with laryngopharyngeal reflux, asthma, sleep apnea, and viral cardiomyopathy with pericardial effusion  Treatment plan: Saline rinse daily  Fluticasone nasal spray, one spray in each nostril daily Zyrtec 10 mg pill daily Singulair 10 mg pill nightly Stop using wixela Symbicort two puffs in the morning and two puffs at night, and rinse mouth after each use; use with spacer device Omeprazole (prilosec) 40 mg daily, and take 30 minutes before your first meal of the day Sip water when you have the urge to cough Avoid clearing your throat or forcing a cough Use sugarless candy to keep your mouth moist Salt water gargles one or two times per day  Tests to be scheduled: Home sleep study  Follow up in 2 weeks with Dr. Halford Chessman or Nurse Practitioner   A total of 49 minutes were spent face to face with the patient and more than half of that time involved counseling or coordination of care.   Chesley Mires, MD [facility] Pager: [phone] 12/30/2018, 9:49 AM  Flow Sheet     Pulmonary tests:  Serology 11/14/11 >> SM, RNP, Ro, La, ANA all negative PFT 06/19/15 >> FEV1 1.87 (109%), FEV1% 74, TLC 3.66 (86%), DLCO 71%  Chest imaging:  CT angio chest 12/05/18 >> atherosclerosis, mild pericardial effusion, lungs clear  Cardiac tests:  Echo 11/16/18 >> EF 30 to 35%, mild/mod TR, small/mod pericardial effusion Nuclear stress test 12/13/18 >> EF 30 to 44%, no evidence of ischemia  Medications:   Allergies as of 12/30/2018      Reactions   Penicillins Itching   Has patient had a PCN reaction causing immediate rash, facial/tongue/throat swelling, SOB or lightheadedness with hypotension: No Has patient had a PCN reaction causing severe rash involving mucus membranes or skin necrosis: No Has patient had a PCN reaction that required hospitalization: Unknown Has patient had a PCN reaction occurring within the last 10 years: No If all of the above answers are "NO", then may proceed with Cephalosporin use.   Tape Other (See Comments)   Blisters   Gabapentin Itching, Rash, Other (See Comments)   Incoherent       Medication List       Accurate as of December 30, 2018  9:49 AM. If you have any questions, ask your nurse or doctor.        budesonide-formoterol 80-4.5 MCG/ACT inhaler Commonly known as: Symbicort Inhale 2 puffs into the lungs 2 (two) times daily. Started by: Chesley Mires, MD   carvedilol 3.125 MG tablet Commonly known as: COREG Take 1 tablet (3.125 mg total) by mouth 2 (two) times daily.   cetirizine 10 MG tablet Commonly known as: ZYRTEC Take 10 mg by mouth daily.   Entresto 49-51 MG Generic drug: sacubitril-valsartan Take 1 tablet by mouth 2 (two) times daily.   fluticasone 50 MCG/ACT nasal spray Commonly known as: FLONASE Place 1 spray into both nostrils daily. Started by: Chesley Mires, MD   furosemide 40 MG tablet Commonly known as: LASIX Take 1 tablet (40 mg total) by mouth daily.   glucose blood test strip Commonly known as: OneTouch Ultra Use to check blood sugar 4-5 times daily   ipratropium-albuterol 0.5-2.5 (3) MG/3ML Soln Commonly known as:  DUONEB Take 3 mLs by nebulization every 6 (six) hours as needed.   montelukast 10 MG tablet Commonly known as: SINGULAIR Take 10 mg by mouth daily.   omeprazole 40 MG capsule Commonly known as: PriLOSEC Take 1 capsule (40 mg total) by mouth daily. What changed:   medication strength  how much to take Changed by: Chesley Mires, MD   potassium chloride 10 MEQ tablet Commonly known as: K-DUR Take 1 tablet (10 mEq total) by mouth daily. What changed: Another medication with the same name was removed. Continue taking this medication, and follow the directions you see here. Changed by: Chesley Mires, MD   ProAir HFA 108 [phone] Base) MCG/ACT inhaler Generic drug: albuterol Inhale 2 puffs into the lungs every 4 (four) hours as needed for wheezing.   spironolactone 25 MG tablet Commonly known as: ALDACTONE Take 1 tablet (25 mg total) by mouth daily.   Vitamin D3 125 MCG (5000 UT) Caps Take 1,000 Units by mouth at  bedtime.   Wixela Inhub 250-50 MCG/DOSE Aepb Generic drug: Fluticasone-Salmeterol Inhale 1 puff into the lungs 2 (two) times daily.       Past Surgical History:  She  has a past surgical history that includes Cesarean section; Ovarian cyst removal; Lipoma excision; Appendectomy; Meniscus repair (Left); and Ovarian cyst surgery.  Family History:  Her family history includes AAA (abdominal aortic aneurysm) in her father; Colon cancer in her maternal uncle; Crohn's disease in her paternal aunt; Diabetes in her mother; Gout in her brother and mother; Heart attack in her brother; Heart failure in her father and mother; Hypertension in her brother, father, and mother.  Social History:  She  reports that she quit smoking about 42 years ago. Her smoking use included cigarettes. She has a 2.25 pack-year smoking history. She has never used smokeless tobacco. She reports that she does not drink alcohol or use drugs.

## 2019-01-03 ENCOUNTER — Other Ambulatory Visit (HOSPITAL_COMMUNITY)
Admission: RE | Admit: 2019-01-03 | Discharge: 2019-01-03 | Disposition: A | Discharge status: Home / Self Care | Payer: Medicare Other | Source: Ambulatory Visit | Attending: Cardiovascular Disease | Admitting: Cardiovascular Disease

## 2019-01-03 DIAGNOSIS — Z20828 Contact with and (suspected) exposure to other viral communicable diseases: Secondary | ICD-10-CM | POA: Insufficient documentation

## 2019-01-03 DIAGNOSIS — Z01812 Encounter for preprocedural laboratory examination: Secondary | ICD-10-CM | POA: Diagnosis present

## 2019-01-03 LAB — SARS CORONAVIRUS 2 (TAT 6-24 HRS): SARS Coronavirus 2: NEGATIVE

## 2019-01-05 ENCOUNTER — Ambulatory Visit (INDEPENDENT_AMBULATORY_CARE_PROVIDER_SITE_OTHER): Payer: Medicare Other | Admitting: Pharmacist

## 2019-01-05 ENCOUNTER — Other Ambulatory Visit: Payer: Medicare Other | Admitting: *Deleted

## 2019-01-05 ENCOUNTER — Other Ambulatory Visit: Payer: Self-pay

## 2019-01-05 ENCOUNTER — Encounter: Payer: Self-pay | Admitting: Pharmacist

## 2019-01-05 VITALS — BP 125/80 | HR 89 | Wt 240.0 lb

## 2019-01-05 DIAGNOSIS — I5020 Unspecified systolic (congestive) heart failure: Secondary | ICD-10-CM | POA: Diagnosis not present

## 2019-01-05 DIAGNOSIS — I5022 Chronic systolic (congestive) heart failure: Secondary | ICD-10-CM

## 2019-01-05 NOTE — Progress Notes (Signed)
Patient ID: Beth Macias                 DOB: August 16, 1950                      MRN: [id number]     HPI: Beth Macias is a 68 y.o. female patient of Dr. Kyla Balzarine referred by Richardson Dopp, PA to [facility] clinic. PMH is significant for chronic systolic CHF, hypertension, hyperlipidemia, diabetes mellitus, morbid obesity, and asthma. Patient last echo showed an EF of 30-35%. She was initially reluctant to start medications. She eventually agreed to start Entresto 26-26 mg twice a day a carvedilol 3.125mg  twice a day on 11/24/2018. She did not want to take lasix daily, but agreed to take prn. She was seen in the ED on 12/05/2018 for shortness of breath and was started on furosemide 40mg  daily. BNP at the hospital was 2,464.   At last visit with pharmacist on 7/17, Beth Macias was increased to 49/51mg  twice a day. On 7/27 she called the clinic complaining of ankle swelling, inability to lay flat without SOB and chest heaviness. Her chest x-ray showed no overt edema. Pro BNP still significantly elevated. Patient was started on spironolactone 25mg  daily.  She saw pulmonology recently who feels her shortness and breath is multifactorial including chronic rhinitis with post nasal drip, acid reflux, asthma and possibly sleep apnea.  Patient presents today in good spirits. Her weight continues to increase, has swelling in her ankles and abdomen. She weighted 132 at my last visit, now up to 140. Patient states that she has always had swelling problems in the summer. She also states that she gets pains in her legs and wonders if she might have a problem in her legs with her veins or arteries. She feels like the lasix is not working that well. She feels like she does not pee as much.  Patient was very pleased with her visit with Dr. Sylvan Cheese of pulmonology and states that her breathing is improving.  Current HTN meds: Entresto 49/51mg  BID, carvedilol 3.125mg  BID, furosemide 40mg  daily, spironolactone 25mg   twice a day, potassium 10MEQ daily  Family History: The patient's family history includes AAA (abdominal aortic aneurysm) in her father; Colon cancer in her maternal uncle; Crohn's disease in her paternal aunt; Diabetes in her mother; Gout in her brother and mother; Heart attack in her brother; Heart failure in her father and mother; Hypertension in her brother, father, and mother.  Social History: former smoker, neg ETOH  Diet: On a meal plan to lose weight- counts her Na intake- has been referred to medical weight management  Exercise: cannot really exert herself without SOB at this time but is very eager to start  Home BP readings:   Wt Readings from Last 3 Encounters:  12/30/18 232 lb (105.2 kg)  12/24/18 234 lb 1.9 oz (106.2 kg)  12/13/18 227 lb (103 kg)   BP Readings from Last 3 Encounters:  12/30/18 126/78  12/24/18 122/90  12/10/18 122/70   Pulse Readings from Last 3 Encounters:  12/30/18 86  12/24/18 (!) 54  12/10/18 67    Renal function: Estimated Creatinine Clearance: 66.1 mL/min (by C-G formula based on SCr of 0.88 mg/dL).  Past Medical History:  Diagnosis Date  . Asthma   . Blood in stool   . Colon polyps   . Diabetes mellitus without complication ([place])   . Diverticulitis   . Fibromyalgia   . GERD (gastroesophageal reflux disease)   .  Gout   . Hypercholesteremia   . Hypertension   . Pneumonia   . Pneumonia   . UTI (urinary tract infection)     Current Outpatient Medications on File Prior to Visit  Medication Sig Dispense Refill  . budesonide-formoterol (SYMBICORT) 80-4.5 MCG/ACT inhaler Inhale 2 puffs into the lungs 2 (two) times daily. 1 Inhaler 11  . carvedilol (COREG) 3.125 MG tablet Take 1 tablet (3.125 mg total) by mouth 2 (two) times daily. 180 tablet 3  . cetirizine (ZYRTEC) 10 MG tablet Take 10 mg by mouth daily.    . Cholecalciferol (VITAMIN D3) 125 MCG (5000 UT) CAPS Take 1,000 Units by mouth at bedtime.     . fluticasone (FLONASE) 50  MCG/ACT nasal spray Place 1 spray into both nostrils daily. 16 g 5  . Fluticasone-Salmeterol (WIXELA INHUB) 250-50 MCG/DOSE AEPB Inhale 1 puff into the lungs 2 (two) times daily.    . furosemide (LASIX) 40 MG tablet Take 1 tablet (40 mg total) by mouth daily. 30 tablet 11  . glucose blood (ONETOUCH ULTRA) test strip Use to check blood sugar 4-5 times daily 50 each 5  . ipratropium-albuterol (DUONEB) 0.5-2.5 (3) MG/3ML SOLN Take 3 mLs by nebulization every 6 (six) hours as needed.    . montelukast (SINGULAIR) 10 MG tablet Take 10 mg by mouth daily.    Marland Kitchen omeprazole (PRILOSEC) 40 MG capsule Take 1 capsule (40 mg total) by mouth daily. 30 capsule 2  . potassium chloride (K-DUR) 10 MEQ tablet Take 1 tablet (10 mEq total) by mouth daily. 30 tablet 11  . PROAIR HFA 108 (90 Base) MCG/ACT inhaler Inhale 2 puffs into the lungs every 4 (four) hours as needed for wheezing.   9  . sacubitril-valsartan (ENTRESTO) 49-51 MG Take 1 tablet by mouth 2 (two) times daily. 60 tablet 11  . spironolactone (ALDACTONE) 25 MG tablet Take 1 tablet (25 mg total) by mouth daily. 30 tablet 3   No current facility-administered medications on file prior to visit.     Allergies  Allergen Reactions  . Penicillins Itching    Has patient had a PCN reaction causing immediate rash, facial/tongue/throat swelling, SOB or lightheadedness with hypotension: No Has patient had a PCN reaction causing severe rash involving mucus membranes or skin necrosis: No Has patient had a PCN reaction that required hospitalization: Unknown Has patient had a PCN reaction occurring within the last 10 years: No If all of the above answers are "NO", then may proceed with Cephalosporin use.  . Tape Other (See Comments)    Blisters  . Gabapentin Itching, Rash and Other (See Comments)    Incoherent    There were no vitals taken for this visit.   Assessment/Plan:  1. CHF medication titration- Blood pressure is adequate for titration of Entresto.  Will plan to increase to 97/103mg  twice a day as long as BMP from today looks good. Will call patient tomorrow with results and plan. Patient continues to gain water weight and swelling is getting worse. I have asked the patient to increase furosemide to 80mg  daily for 2 days, then start taking 60mg  daily thereafter. She is to call me if she does not notice any improvement. May need to further increase furosemide if she does not have a good response or consider switching to torsemide. Plus today in clinic was also adequate. However due to fluid status will hold off on any titration of her beta blocker. Will follow up in clinic in 4 weeks.  Labs in  a week.   Thank you  Ramond Dial, Pharm.D, [facility]  [address] [place], [place] [address]  Phone: [phone]; Fax: [phone]

## 2019-01-05 NOTE — Patient Instructions (Addendum)
It was great to see you again today The plan is as follows:  1. Take Lasix 80mg  daily for 2 days 2. Then start taking Lasix 60mg  daily on 8/15  3. I will call you tomorrow 8/13 with your lab results. We will plan to increase Entresto to 97/103 twice a day as long as labs look good 4. Continue to weigh yourself daily, if your weights continues to rise and swelling does not improve, give me a call at [phone].

## 2019-01-06 ENCOUNTER — Telehealth: Payer: Self-pay | Admitting: Pharmacist

## 2019-01-06 DIAGNOSIS — I5020 Unspecified systolic (congestive) heart failure: Secondary | ICD-10-CM

## 2019-01-06 LAB — BASIC METABOLIC PANEL
BUN/Creatinine Ratio: 21 (ref 12–28)
BUN: 20 mg/dL (ref 8–27)
CO2: 26 mmol/L (ref 20–29)
Calcium: 9.1 mg/dL (ref 8.7–10.3)
Chloride: 106 mmol/L (ref 96–106)
Creatinine, Ser: 0.95 mg/dL (ref 0.57–1.00)
GFR calc Af Amer: 71 mL/min/{1.73_m2} (ref >59)
GFR calc non Af Amer: 61 mL/min/{1.73_m2} (ref >59)
Glucose: 145 mg/dL — ABNORMAL HIGH (ref 65–99)
Potassium: 3.6 mmol/L (ref 3.5–5.2)
Sodium: 145 mmol/L — ABNORMAL HIGH (ref 134–144)

## 2019-01-06 LAB — MAGNESIUM: Magnesium: 1.2 mg/dL — ABNORMAL LOW (ref 1.6–2.3)

## 2019-01-06 MED ORDER — ENTRESTO 97-103 MG PO TABS: 1.0000 | ORAL_TABLET | Freq: Two times a day (BID) | ORAL | 11 refills | Status: DC

## 2019-01-06 MED ORDER — MAGNESIUM OXIDE 400 MG PO TABS: 400.0000 mg | ORAL_TABLET | Freq: Every day | ORAL | 11 refills | Status: DC

## 2019-01-06 NOTE — Telephone Encounter (Signed)
Scr and K look good. Will go ahead and increase Entresto to 97/103mg  BID. Will repeat blood work at the end of next week due to increased dose of furosemide. Patient advised again to call us if swelling does not improve. Mg also low. Can start MagOx 400mg  daily.  Na slightly high-advised to increase water intake slightly.

## 2019-01-07 ENCOUNTER — Ambulatory Visit: Payer: Medicare Other

## 2019-01-07 ENCOUNTER — Other Ambulatory Visit: Payer: Medicare Other

## 2019-01-07 ENCOUNTER — Other Ambulatory Visit: Payer: Self-pay

## 2019-01-07 ENCOUNTER — Ambulatory Visit (INDEPENDENT_AMBULATORY_CARE_PROVIDER_SITE_OTHER): Payer: Medicare Other | Admitting: Pulmonary Disease

## 2019-01-07 DIAGNOSIS — J4540 Moderate persistent asthma, uncomplicated: Secondary | ICD-10-CM | POA: Diagnosis not present

## 2019-01-07 DIAGNOSIS — I5022 Chronic systolic (congestive) heart failure: Secondary | ICD-10-CM

## 2019-01-07 LAB — PULMONARY FUNCTION TEST
DL/VA % pred: 125 %
DL/VA: 5.37 ml/min/mmHg/L
DLCO cor % pred: 87 %
DLCO cor: 14.87 ml/min/mmHg
DLCO unc % pred: 88 %
DLCO unc: 15.01 ml/min/mmHg
FEF 25-75 Post: 1.24 L/sec
FEF 25-75 Pre: 2.08 L/sec
FEF2575-%Change-Post: -40 %
FEF2575-%Pred-Post: 85 %
FEF2575-%Pred-Pre: 142 %
FEV1-%Change-Post: -9 %
FEV1-%Pred-Post: 85 %
FEV1-%Pred-Pre: 94 %
FEV1-Post: 1.29 L
FEV1-Pre: 1.43 L
FEV1FVC-%Change-Post: 0 %
FEV1FVC-%Pred-Pre: 113 %
FEV6-%Change-Post: -10 %
FEV6-%Pred-Post: 77 %
FEV6-%Pred-Pre: 86 %
FEV6-Post: 1.45 L
FEV6-Pre: 1.62 L
FEV6FVC-%Pred-Post: 104 %
FEV6FVC-%Pred-Pre: 104 %
FVC-%Change-Post: -10 %
FVC-%Pred-Post: 73 %
FVC-%Pred-Pre: 82 %
FVC-Post: 1.45 L
FVC-Pre: 1.62 L
Post FEV1/FVC ratio: 89 %
Post FEV6/FVC ratio: 100 %
Pre FEV1/FVC ratio: 88 %
Pre FEV6/FVC Ratio: 100 %
RV % pred: 74 %
RV: 1.47 L
TLC % pred: 64 %
TLC: 2.88 L

## 2019-01-07 NOTE — Progress Notes (Signed)
Full PFT performed today. °

## 2019-01-10 ENCOUNTER — Telehealth: Payer: Self-pay | Admitting: Cardiovascular Disease

## 2019-01-10 NOTE — Telephone Encounter (Signed)
Called and spoke with patient. Will have patient try stopping magox- this is the only entirely new medication. She can take benadryl for the itching or try OTC hydrocortisone cream.  Patient states that the heart medication makes her tired. But she naps and then feels better. States weight is coming down, She is to have labs Thursday. Will call Friday with results and to follow up on rash.

## 2019-01-10 NOTE — Telephone Encounter (Signed)
Spoke with patient who states she has a rash or hives on the lower half of her abdomen that itches States her arms are itching are as well; reports it is raised with some tenderness but denies redness She states this started after having appointment with Pharm D at our office on 8/12 at which time furosemide and Entresto were increased Denies lip or tongue swelling; patient speaks clearly She denies change in diet or any personal products that touch her skin She reports she has noticed more fatigue over the last few days Having more urine output and leg swelling has improved Upon further review, discovered other recent med changes as well: Increased omeprazole to 40 mg daily Has been taking furosemide 80 mg since last week, she forgot to reduce to 60 mg but will do so tomorrow Started taking OTC mag oxide 400 mg daily  Was prescribed Symbicort inhaler by Dr. Halford Chessman with fluticasone and saline nasal spray to be used prior to Symbicort Patient states she is overwhelmed with all the new medications. I advised that I will forward problem to PharmD for advice; patient is comfortable with this plan. She thanked me for the call.

## 2019-01-10 NOTE — Telephone Encounter (Signed)
New message:    Patient calling stating that she might be having reaction from medication Please call patient.

## 2019-01-13 ENCOUNTER — Other Ambulatory Visit: Payer: Self-pay

## 2019-01-13 ENCOUNTER — Telehealth: Payer: Self-pay | Admitting: Pharmacist

## 2019-01-13 ENCOUNTER — Other Ambulatory Visit: Payer: Medicare Other | Admitting: *Deleted

## 2019-01-13 ENCOUNTER — Telehealth: Payer: Self-pay

## 2019-01-13 DIAGNOSIS — I5020 Unspecified systolic (congestive) heart failure: Secondary | ICD-10-CM

## 2019-01-13 LAB — BASIC METABOLIC PANEL
BUN/Creatinine Ratio: 17 (ref 12–28)
BUN: 18 mg/dL (ref 8–27)
CO2: 27 mmol/L (ref 20–29)
Calcium: 9.3 mg/dL (ref 8.7–10.3)
Chloride: 100 mmol/L (ref 96–106)
Creatinine, Ser: 1.06 mg/dL — ABNORMAL HIGH (ref 0.57–1.00)
GFR calc Af Amer: 62 mL/min/{1.73_m2} (ref >59)
GFR calc non Af Amer: 54 mL/min/{1.73_m2} — ABNORMAL LOW (ref >59)
Glucose: 172 mg/dL — ABNORMAL HIGH (ref 65–99)
Potassium: 3.5 mmol/L (ref 3.5–5.2)
Sodium: 143 mmol/L (ref 134–144)

## 2019-01-13 LAB — MAGNESIUM: Magnesium: 1.1 mg/dL — ABNORMAL LOW (ref 1.6–2.3)

## 2019-01-13 MED ORDER — ENTRESTO 49-51 MG PO TABS: 1.0000 | ORAL_TABLET | Freq: Two times a day (BID) | ORAL | 11 refills | Status: DC

## 2019-01-13 NOTE — Telephone Encounter (Signed)
Patient presents today for lab work and requested to speak with me. She states that the welps on her stomach and back have not improved since stopping magnesium. Patient is convinced that its releated to the higher doses of furosemide and Entresto. She also complains of being moody and 2 episodes when she felt like she was going to pass out. States swelling is better, but still there. Unsure if "lasix is right for her."  After discussion we have decided to lower Entresto to 49/51mg  twice a day. Will plan to possible switch patient from furosemide to toresmide at next follow up.   Patient to call if no improvement in welts. I have a feeling her moodiness and fatigue could be from her carvedilol but patient is not convinced. Will continue to monitor. Could consider switching to metoprolol succinate in the future.

## 2019-01-13 NOTE — Telephone Encounter (Signed)
Call made to patient, made aware that she has an appt tomorrow to discuss HST results however she has not had the HST done. She reports she would atleast like to discuss PFT results. Requested visit be changed to televisit. Spoke with [facility] they will be calling her to get her scheduled. Nothing further needed at this time.

## 2019-01-14 ENCOUNTER — Encounter: Payer: Self-pay | Admitting: Pulmonary Disease

## 2019-01-14 ENCOUNTER — Ambulatory Visit (INDEPENDENT_AMBULATORY_CARE_PROVIDER_SITE_OTHER): Payer: Medicare Other | Admitting: Pulmonary Disease

## 2019-01-14 ENCOUNTER — Telehealth: Payer: Self-pay | Admitting: Pharmacist

## 2019-01-14 DIAGNOSIS — Z6841 Body Mass Index (BMI) 40.0 and over, adult: Secondary | ICD-10-CM

## 2019-01-14 DIAGNOSIS — I3139 Other pericardial effusion (noninflammatory): Secondary | ICD-10-CM

## 2019-01-14 DIAGNOSIS — J4540 Moderate persistent asthma, uncomplicated: Secondary | ICD-10-CM | POA: Diagnosis not present

## 2019-01-14 DIAGNOSIS — K219 Gastro-esophageal reflux disease without esophagitis: Secondary | ICD-10-CM | POA: Diagnosis not present

## 2019-01-14 DIAGNOSIS — I313 Pericardial effusion (noninflammatory): Secondary | ICD-10-CM

## 2019-01-14 DIAGNOSIS — R0683 Snoring: Secondary | ICD-10-CM

## 2019-01-14 DIAGNOSIS — R05 Cough: Secondary | ICD-10-CM | POA: Diagnosis not present

## 2019-01-14 DIAGNOSIS — R053 Chronic cough: Secondary | ICD-10-CM

## 2019-01-14 DIAGNOSIS — R058 Other specified cough: Secondary | ICD-10-CM

## 2019-01-14 NOTE — Patient Instructions (Signed)
Will arrange for sampling of your sputum for culture  Will call with results of home sleep study  Follow up in 2 months

## 2019-01-14 NOTE — Progress Notes (Signed)
[facility]  Chief Complaint  Patient presents with  . Cough    Review PFT and medications    Constitutional:  There were no vitals taken for this visit.  Deferred  Past Medical History:  PNA, HTN, HLD, Gout, GERD, Fibromyalgia, Diverticulitis, DM, Colon polyps, dilated cardiomyopathy  Brief Summary:  Beth Macias is a 68 y.o. female former smoker with dyspnea and cough.  Virtual Visit via Telephone Note  I connected with Beth Macias on 01/14/19 at 12:00 PM EDT by telephone and verified that I am speaking with the correct person using two identifiers.  Location: Patient: home  Provider: medical office   I discussed the limitations, risks, security and privacy concerns of performing an evaluation and management service by telephone and the availability of in person appointments. I also discussed with the patient that there may be a patient responsible charge related to this service. The patient expressed understanding and agreed to proceed.  She is still getting cough and phlegm in her throat.  Feels some improvement since last visit.  She is worried she could have an infection that isn't clearing.  She hasn't done sleep study yet, but will set this up when she gets back into town in September.  Her PFT showed mild restrictive disease, and this is likely related to obesity (BMI at last visit was 52).    Physical Exam:   Deferred.  Discussion:  She has multifactorial dyspnea.  She has prior history of asthma.  Seems that she had respiratory infection in November 2019.  This resulted in viral cardiomyopathy, pericardial effusion, worsening asthma.  She also has chronic rhinitis with postnasal drip and reflux contributing to upper airway irritation and wheezing.  Finally she has symptoms suggestive of sleep apnea.  Assessment/Plan:   Upper airway cough syndrome. - continue nasal irrigation, flonase, zyrtec, and  singulair - will arrange for sampling of her sputum for culture  Moderate, persistent asthma. - continue symbicort 80/4.5, singulair, prn albuterol for now - she is to use spacer device with inhalers  Laryngopharyngeal reflux. - continue omeprazole 40 mg daily  Snoring with sleep disruption and witnessed apnea. - will call her with results of home sleep study  Viral cardiomyopathy with pericardial effusion. - she has f/u with Dr. Johnsie Cancel in September  Obesity. - would benefit from monitored exercise regimen; will await f/u with Dr. Johnsie Cancel before arranging for this   Patient Instructions  Will arrange for sampling of your sputum for culture  Will call with results of home sleep study  Follow up in 2 months    I discussed the assessment and treatment plan with the patient. The patient was provided an opportunity to ask questions and all were answered. The patient agreed with the plan and demonstrated an understanding of the instructions.   The patient was advised to call back or seek an in-person evaluation if the symptoms worsen or if the condition fails to improve as anticipated.  I provided 14 minutes of non-face-to-face time during this encounter.   Chesley Mires, MD     Chesley Mires, MD [facility] Pager: [phone] 01/14/2019, 12:24 PM  Flow Sheet     Pulmonary tests:  Serology 11/14/11 >> SM, RNP, Ro, La, ANA all negative PFT 06/19/15 >> FEV1 1.87 (109%), FEV1% 74, TLC 3.66 (86%), DLCO 71% CBC 03/04/16 >> Eosinophils 0.26  PFT 01/07/19 >> 1.43 (94%), FEV1% 88, TLC 2.88 (64%), DLCO 88%, no BD  Chest imaging:  CT angio chest 12/05/18 >> atherosclerosis, mild pericardial effusion, lungs clear  Cardiac tests:  Echo 11/16/18 >> EF 30 to 35%, mild/mod TR, small/mod pericardial effusion Nuclear stress test 12/13/18 >> EF 30 to 44%, no evidence of ischemia  Medications:   Allergies as of 01/14/2019      Reactions   Penicillins Itching   Has  patient had a PCN reaction causing immediate rash, facial/tongue/throat swelling, SOB or lightheadedness with hypotension: No Has patient had a PCN reaction causing severe rash involving mucus membranes or skin necrosis: No Has patient had a PCN reaction that required hospitalization: Unknown Has patient had a PCN reaction occurring within the last 10 years: No If all of the above answers are "NO", then may proceed with Cephalosporin use.   Tape Other (See Comments)   Blisters   Gabapentin Itching, Rash, Other (See Comments)   Incoherent      Medication List       Accurate as of January 14, 2019 12:24 PM. If you have any questions, ask your nurse or doctor.        STOP taking these medications   Wixela Inhub 250-50 MCG/DOSE Aepb Generic drug: Fluticasone-Salmeterol Stopped by: Chesley Mires, MD     TAKE these medications   budesonide-formoterol 80-4.5 MCG/ACT inhaler Commonly known as: Symbicort Inhale 2 puffs into the lungs 2 (two) times daily.   carvedilol 3.125 MG tablet Commonly known as: COREG Take 1 tablet (3.125 mg total) by mouth 2 (two) times daily.   cetirizine 10 MG tablet Commonly known as: ZYRTEC Take 10 mg by mouth daily.   Entresto 49-51 MG Generic drug: sacubitril-valsartan Take 1 tablet by mouth 2 (two) times daily.   fluticasone 50 MCG/ACT nasal spray Commonly known as: FLONASE Place 1 spray into both nostrils daily.   furosemide 40 MG tablet Commonly known as: LASIX Take 1.5 tablets (60 mg total) by mouth daily.   glucose blood test strip Commonly known as: OneTouch Ultra Use to check blood sugar 4-5 times daily   ipratropium-albuterol 0.5-2.5 (3) MG/3ML Soln Commonly known as: DUONEB Take 3 mLs by nebulization every 6 (six) hours as needed.   magnesium oxide 400 MG tablet Commonly known as: MAG-OX Take 1 tablet (400 mg total) by mouth daily.   montelukast 10 MG tablet Commonly known as: SINGULAIR Take 10 mg by mouth daily.   omeprazole  40 MG capsule Commonly known as: PriLOSEC Take 1 capsule (40 mg total) by mouth daily.   potassium chloride 10 MEQ tablet Commonly known as: K-DUR Take 1 tablet (10 mEq total) by mouth daily.   ProAir HFA 108 (90 Base) MCG/ACT inhaler Generic drug: albuterol Inhale 2 puffs into the lungs every 4 (four) hours as needed for wheezing.   spironolactone 25 MG tablet Commonly known as: ALDACTONE Take 1 tablet (25 mg total) by mouth daily.   Vitamin D3 125 MCG (5000 UT) Caps Take 1,000 Units by mouth at bedtime.       Past Surgical History:  She  has a past surgical history that includes Cesarean section; Ovarian cyst removal; Lipoma excision; Appendectomy; Meniscus repair (Left); and Ovarian cyst surgery.  Family History:  Her family history includes AAA (abdominal aortic aneurysm) in her father; Colon cancer in her maternal uncle; Crohn's disease in her paternal aunt; Diabetes in her mother; Gout in her brother and mother; Heart attack in her brother; Heart failure in her father and mother; Hypertension in her brother, father, and mother.  Social History:  She  reports that she quit smoking about 42 years ago. Her smoking use included cigarettes. She has a 2.25 pack-year smoking history. She has never used smokeless tobacco. She reports that she does not drink alcohol or use drugs.

## 2019-01-14 NOTE — Telephone Encounter (Signed)
Called patient, informed her that BMP looks stable. Mg low at 1.1. Previously held MagOx to see if it was causing welts. Welts did not improve, therefore MagOx to be resumed. Patient appreciative of the call.

## 2019-01-22 ENCOUNTER — Telehealth: Payer: Self-pay | Admitting: Medical

## 2019-01-22 MED ORDER — METHYLPREDNISOLONE 4 MG PO TBPK: ORAL_TABLET | ORAL | 0 refills | Status: DC

## 2019-01-22 NOTE — Telephone Encounter (Signed)
Broke out in hives 01/10/19 wants to know if it could be symbicort   Already using all the usual otcs and called in medrol by Marton Redwood  I told pt I strongly doubt it could be symbicort by no problem trying without it for a few days and consider referral to allergy this week at Dr Juanetta Gosling direction  Christinia Gully, MD [facility] and [facility] [phone] After 5:30 PM or weekends, use [facility] [phone]

## 2019-01-22 NOTE — Telephone Encounter (Signed)
   Patient called the after hours line with complaints of intractable itching and hives. This has been going on since 01/10/2019. She has tried benadryl, calamine lotion, and hydrocortisone cream without improvement in itching and she reports feeling quite miserable. No complaints of perioral swelling, throat tightness, difficulty swallowing, or SOB. She expresses concern that this is 2/2 an increase in her entresto dose earlier this month, though had been tolerating entresto at a lower dose without complaints for the past 1.5 months. Her most recent medication change was the addition of a symbicort inhaler starting 12/30/2018. Query whether she is having an allergic reaction to symbicort rather than her cardiac medications which she had been tolerating without incident since early July. She is scheduled to see Dr. Johnsie Cancel 01/28/2019. Patient to call her pulmonologist office for advice on stopping symbicort.   Will prescribe a steroid dose pak at this time to help with hives and itching. Patient will monitor her blood sugars closely and speak with her PCP if glucose is persistently elevated as she is pre-diabetic.   If no improvement with steroids and cessation of symbicort, may need to stop medications and re-introduce in a stepwise approach to sort out what the inciting medication is.   Will route to Drs. Nishan and Sood.   Abigail Butts, PA-C 01/22/19; 2:32 PM

## 2019-01-24 NOTE — Telephone Encounter (Signed)
From what I recall she was already taking singulair.  Only new pulmonary medication added was symbicort.  Dr. Melvyn Novas advised her to not use this on 01/22/19.  Will have my office staff arrange for in office ROV with me or one of our nurse practitioners to assess further.

## 2019-01-24 NOTE — Telephone Encounter (Signed)
ATC patient, unable to reach , left message on vm to call back to make an appointment. Will wait for call back

## 2019-01-24 NOTE — Telephone Encounter (Signed)
My thought is that it could be the Singulair as none of her heart medications have changed, just increased doses. Patient and I did discuss this when she walked into the office last week, but she seemed determined that it was from the increased Entresto dose. Advised to decrease Entresto. Also considering changed furosemide to toresmide this week as patient is not convinced furosemide is the "right diuretic for her"  I feel as though a trial off singulair is warranted before we start holding CHF meds since patient is currently not euvolemic.   Dr. Halford Chessman what are your thoughts?

## 2019-01-25 NOTE — Telephone Encounter (Signed)
LMTCB

## 2019-01-27 ENCOUNTER — Telehealth: Payer: Self-pay

## 2019-01-27 NOTE — Telephone Encounter (Signed)
Spoke with pt who states she would like to stay virtual for appt with Dr. Johnsie Cancel on 01/28/19 at 9:30 am. Pt is aware to have vitals signs available 30 mins prior to appt. Pt verblized understanding and thanked me for the call.     Virtual Visit Pre-Appointment Phone Call  "(Name), I am calling you today to discuss your upcoming appointment. We are currently trying to limit exposure to the virus that causes COVID-19 by seeing patients at home rather than in the office."  1. "What is the BEST phone number to call the day of the visit?" - include this in appointment notes  2. "Do you have or have access to (through a family member/friend) a smartphone with video capability that we can use for your visit?" a. If yes - list this number in appt notes as "cell" (if different from BEST phone #) and list the appointment type as a VIDEO visit in appointment notes b. If no - list the appointment type as a PHONE visit in appointment notes  3. Confirm consent - "In the setting of the current Covid19 crisis, you are scheduled for a (phone or video) visit with your provider on (date) at (time).  Just as we do with many in-office visits, in order for you to participate in this visit, we must obtain consent.  If you'd like, I can send this to your mychart (if signed up) or email for you to review.  Otherwise, I can obtain your verbal consent now.  All virtual visits are billed to your insurance company just like a normal visit would be.  By agreeing to a virtual visit, we'd like you to understand that the technology does not allow for your provider to perform an examination, and thus may limit your provider's ability to fully assess your condition. If your provider identifies any concerns that need to be evaluated in person, we will make arrangements to do so.  Finally, though the technology is pretty good, we cannot assure that it will always work on either your or our end, and in the setting of a video visit, we may  have to convert it to a phone-only visit.  In either situation, we cannot ensure that we have a secure connection.  Are you willing to proceed?" STAFF: Did the patient verbally acknowledge consent to telehealth visit? Document YES/NO here: yes  4. Advise patient to be prepared - "Two hours prior to your appointment, go ahead and check your blood pressure, pulse, oxygen saturation, and your weight (if you have the equipment to check those) and write them all down. When your visit starts, your provider will ask you for this information. If you have an Apple Watch or Kardia device, please plan to have heart rate information ready on the day of your appointment. Please have a pen and paper handy nearby the day of the visit as well."  5. Give patient instructions for MyChart download to smartphone OR Doximity/Doxy.me as below if video visit (depending on what platform provider is using)  6. Inform patient they will receive a phone call 15 minutes prior to their appointment time (may be from unknown caller ID) so they should be prepared to answer    Beth Macias has been deemed a candidate for a follow-up tele-health visit to limit community exposure during the Covid-19 pandemic. I spoke with the patient via phone to ensure availability of phone/video source, confirm preferred email & phone number, and discuss instructions  and expectations.  I reminded Beth Macias to be prepared with any vital sign and/or heart rhythm information that could potentially be obtained via home monitoring, at the time of her visit. I reminded Beth Macias to expect a phone call prior to her visit.  Beth Macias, [place] 01/27/2019 4:51 PM   INSTRUCTIONS FOR DOWNLOADING THE MYCHART APP TO SMARTPHONE  - The patient must first make sure to have activated MyChart and know their login information - If Apple, go to [employer] and type in MyChart in the search bar and download  the app. If Android, ask patient to go to [employer] and type in [place] in the search bar and download the app. The app is free but as with any other app downloads, their phone may require them to verify saved payment information or Apple/Android password.  - The patient will need to then log into the app with their MyChart username and password, and select [facility] as their healthcare provider to link the account. When it is time for your visit, go to the MyChart app, find appointments, and click Begin Video Visit. Be sure to Select Allow for your device to access the Microphone and Camera for your visit. You will then be connected, and your provider will be with you shortly.  **If they have any issues connecting, or need assistance please contact MyChart service desk [phone])**  **If using a computer, in order to ensure the best quality for their visit they will need to use either of the following Internet Browsers: [employer], or Google Chrome**  IF USING DOXIMITY or DOXY.ME - The patient will receive a link just prior to their visit by text.     FULL LENGTH CONSENT FOR TELE-HEALTH VISIT   I hereby voluntarily request, consent and authorize [facility] and its employed or contracted physicians, physician assistants, nurse practitioners or other licensed health care professionals (the Practitioner), to provide me with telemedicine health care services (the "Services") as deemed necessary by the treating Practitioner. I acknowledge and consent to receive the Services by the Practitioner via telemedicine. I understand that the telemedicine visit will involve communicating with the Practitioner through live audiovisual communication technology and the disclosure of certain medical information by electronic transmission. I acknowledge that I have been given the opportunity to request an in-person assessment or other available alternative prior to the telemedicine  visit and am voluntarily participating in the telemedicine visit.  I understand that I have the right to withhold or withdraw my consent to the use of telemedicine in the course of my care at any time, without affecting my right to future care or treatment, and that the Practitioner or I may terminate the telemedicine visit at any time. I understand that I have the right to inspect all information obtained and/or recorded in the course of the telemedicine visit and may receive copies of available information for a reasonable fee.  I understand that some of the potential risks of receiving the Services via telemedicine include:  Marland Kitchen Delay or interruption in medical evaluation due to technological equipment failure or disruption; . Information transmitted may not be sufficient (e.g. poor resolution of images) to allow for appropriate medical decision making by the Practitioner; and/or  . In rare instances, security protocols could fail, causing a breach of personal health information.  Furthermore, I acknowledge that it is my responsibility to provide information about my medical history, conditions and care that is complete and accurate  to the best of my ability. I acknowledge that Practitioner's advice, recommendations, and/or decision may be based on factors not within their control, such as incomplete or inaccurate data provided by me or distortions of diagnostic images or specimens that may result from electronic transmissions. I understand that the practice of medicine is not an exact science and that Practitioner makes no warranties or guarantees regarding treatment outcomes. I acknowledge that I will receive a copy of this consent concurrently upon execution via email to the email address I last provided but may also request a printed copy by calling the office of [facility].    I understand that my insurance will be billed for this visit.   I have read or had this consent read to me. . I  understand the contents of this consent, which adequately explains the benefits and risks of the Services being provided via telemedicine.  . I have been provided ample opportunity to ask questions regarding this consent and the Services and have had my questions answered to my satisfaction. . I give my informed consent for the services to be provided through the use of telemedicine in my medical care  By participating in this telemedicine visit I agree to the above.

## 2019-01-28 ENCOUNTER — Telehealth (INDEPENDENT_AMBULATORY_CARE_PROVIDER_SITE_OTHER): Payer: Medicare Other | Admitting: Cardiovascular Disease

## 2019-01-28 ENCOUNTER — Other Ambulatory Visit: Payer: Self-pay | Admitting: *Deleted

## 2019-01-28 ENCOUNTER — Other Ambulatory Visit: Payer: Self-pay

## 2019-01-28 ENCOUNTER — Encounter: Payer: Self-pay | Admitting: Cardiovascular Disease

## 2019-01-28 VITALS — Ht 60.0 in | Wt 235.5 lb

## 2019-01-28 DIAGNOSIS — I5020 Unspecified systolic (congestive) heart failure: Secondary | ICD-10-CM | POA: Diagnosis not present

## 2019-01-28 DIAGNOSIS — I10 Essential (primary) hypertension: Secondary | ICD-10-CM

## 2019-01-28 DIAGNOSIS — E876 Hypokalemia: Secondary | ICD-10-CM | POA: Diagnosis not present

## 2019-01-28 DIAGNOSIS — Z79899 Other long term (current) drug therapy: Secondary | ICD-10-CM

## 2019-01-28 MED ORDER — TORSEMIDE 20 MG PO TABS: 20.0000 mg | ORAL_TABLET | Freq: Every day | ORAL | 3 refills | Status: DC

## 2019-01-28 NOTE — Patient Instructions (Addendum)
Your physician has recommended you make the following change in your medication:  STOP SPIRONOLACTONE STOP FUROSEMIDE START TORSEMIDE 20 MG EVERY DAY   Your physician recommends that you return for lab work in:  [place] recommends that you schedule a follow-up appointment in:  [place] Beth Macias

## 2019-01-28 NOTE — [place] Notes (Signed)
Virtual Visit via Telephone Note   This visit type was conducted due to national recommendations for restrictions regarding the COVID-19 Pandemic (e.g. social distancing) in an effort to limit this patient's exposure and mitigate transmission in our community.  Due to her co-morbid illnesses, this patient is at least at moderate risk for complications without adequate follow up.  This format is felt to be most appropriate for this patient at this time.  The patient did not have access to video technology/had technical difficulties with video requiring transitioning to audio format only (telephone).  All issues noted in this document were discussed and addressed.  No physical exam could be performed with this format.  Please refer to the patient's chart for her  consent to telehealth for [facility].   Date:  01/28/2019   ID:  Beth Macias, DOB 1951-01-30, MRN [id number]  Patient Location: Home Provider Location: Office  PCP:  Billie Ruddy, MD  Cardiologist:   Johnsie Cancel Electrophysiologist:  None   Evaluation Performed:  Follow-Up Visit  Chief Complaint:  CHF  History of Present Illness:    Beth Macias is a 68 y.o. female who was first seen January 23,2020   for consultation regarding chest pain. Referred by Lennette Bihari [facility] and Jaclyn Shaggy [facility]  Reviewed ER visit notes from 04/07/18  Lots of URI, congestive symptoms  And urinary frequency. BP was elevated in ER Had abnormal ECG with no old one to compare  Reviewed ECG from 04/07/18 SR PVC isolated LAD and poor R wave progression no acute ST changes Troponin negative x 2 CXR with CE ligular airspace disease could Be scarring , atelectasis or early pneumonia. F/U CXR done 04/26/18 NAD.     Echo done 11/16/18 reviewed EF 30-35% with moderate MR Small to moderate sized pericardial effusion Myovue done 12/13/18 normal perfusion EF 32%   Has had issues with hives since her 30's They got worse when she was  started on pulmonary and CHF meds. Not clear culprit. Discussed d/c aldactone and lasix and starting demedex. Stopping magox did not help and lowering entresto did not help. She is finishing steroid taper and has cream which has helped    The patient  does not have symptoms concerning for COVID-19 infection (fever, chills, cough, or new shortness of breath).    Past Medical History:  Diagnosis Date  . Asthma   . Blood in stool   . Colon polyps   . Diabetes mellitus without complication ([place])   . Diverticulitis   . Fibromyalgia   . GERD (gastroesophageal reflux disease)   . Gout   . Hypercholesteremia   . Hypertension   . Pneumonia   . Pneumonia   . UTI (urinary tract infection)    Past Surgical History:  Procedure Laterality Date  . APPENDECTOMY    . CESAREAN SECTION     x 3  . LIPOMA EXCISION    . MENISCUS REPAIR Left   . OVARIAN CYST REMOVAL    . OVARIAN CYST SURGERY       No outpatient medications have been marked as taking for the 01/28/19 encounter (Telemedicine) with Josue Hector, MD.     Allergies:   Penicillins, Tape, and Gabapentin   Social History   Tobacco Use  . Smoking status: Former Smoker    Packs/day: 0.25    Years: 9.00    Pack years: 2.25    Types: Cigarettes    Quit date: 05/26/1976    Years  since quitting: 42.7  . Smokeless tobacco: Never Used  Substance Use Topics  . Alcohol use: Never    Frequency: Never  . Drug use: Never     Family Hx: The patient's family history includes AAA (abdominal aortic aneurysm) in her father; Colon cancer in her maternal uncle; Crohn's disease in her paternal aunt; Diabetes in her mother; Gout in her brother and mother; Heart attack in her brother; Heart failure in her father and mother; Hypertension in her brother, father, and mother.  ROS:   Please see the history of present illness.     All other systems reviewed and are negative.   Prior CV studies:   The following studies were reviewed today:   Echo 11/16/18 Myovue 12/13/18  Labs/Other Tests and Data Reviewed:    EKG:  Not done today   Recent Labs: 12/05/2018: B Natriuretic Peptide 2,464.6; Hemoglobin 13.7; Platelets 274 12/10/2018: ALT 15 12/23/2018: NT-Pro BNP 3,757 01/13/2019: BUN 18; Creatinine, Ser 1.06; Magnesium 1.1; Potassium 3.5; Sodium 143   Recent Lipid Panel No results found for: CHOL, TRIG, HDL, CHOLHDL, LDLCALC, LDLDIRECT  Wt Readings from Last 3 Encounters:  01/28/19 235 lb 8 oz (106.8 kg)  01/05/19 240 lb (108.9 kg)  12/30/18 232 lb (105.2 kg)     Objective:    Vital Signs:  Ht 5' (1.524 m)   Wt 235 lb 8 oz (106.8 kg)   BMI 45.99 kg/m    Telephone visit no exam   ASSESSMENT & PLAN:    CHF:  Non ischemic DCM continue coreg, and entresto D/c lasix and aldactone start demedex f/u Pharm D 4 weeks with BMET/Mg/BNP f/u with me in 3 months  Pulmonary:  F/u Wert continue symbicort and pro Air finished with dose pack   GERD:  Continue prilosec   COVID-19 Education: The signs and symptoms of COVID-19 were discussed with the patient and how to seek care for testing (follow up with PCP or arrange E-visit).  The importance of social distancing was discussed today.  Time:   Today, I have spent 30 minutes with the patient with telehealth technology discussing the above problems.     Medication Adjustments/Labs and Tests Ordered: Current medicines are reviewed at length with the patient today.  Concerns regarding medicines are outlined above.   Tests Ordered:  Labs in 4 weeks   Medication Changes:  D/c Lasix and Aldactone Start Demedex 20 mg daily   Disposition:  Follow up with pharm D 4 weeks and me in 3 months   Signed, Jenkins Rouge, MD  01/28/2019 9:49 AM    [place]

## 2019-02-01 ENCOUNTER — Telehealth: Payer: Self-pay | Admitting: Cardiovascular Disease

## 2019-02-01 NOTE — Telephone Encounter (Signed)
New message   Pt c/o medication issue:  1. Name of Medication: torsemide (DEMADEX) 20 MG tablet  2. How are you currently taking this medication (dosage and times per day)? 1 tablet per day  3. Are you having a reaction (difficulty breathing--STAT)? n/a  4. What is your medication issue? Patient needs to know if she can stop taking all other diuretic with this medication?

## 2019-02-01 NOTE — Telephone Encounter (Signed)
Phoned patient who states she took lasix, aldactone and torsemide on Sat. and Sunday and is concerned that she lost 7 pounds in 1 day. Yesterday she just took torsemide and did not lose weight or have any edema.  Clarified that at last office visit Friday 01/28/19, lasix and aldactone were stopped and torsemide started.  She verbalized understanding to only take torsemide going forward. States that she started taking magnesium 400 mg/day and vitamin C 500 mg/day. Medication list updated. Instructed to call us for any further questions or concerns.

## 2019-02-02 ENCOUNTER — Telehealth: Payer: Self-pay | Admitting: Pulmonary Disease

## 2019-02-02 NOTE — Telephone Encounter (Signed)
Dr. Halford Chessman I have tried to contact this patient on 08/20, 8/26, and 9/1. LVM on all 3 days asking her to contact the office to schedule the Home Sleep Study you order. To this day I have not heard anything from her.

## 2019-02-03 ENCOUNTER — Ambulatory Visit: Payer: Medicare Other

## 2019-02-03 ENCOUNTER — Other Ambulatory Visit: Payer: Medicare Other

## 2019-02-03 NOTE — Telephone Encounter (Signed)
Noted.  Will discuss with her at next [place].

## 2019-02-09 ENCOUNTER — Telehealth: Payer: Medicare Other | Admitting: Cardiovascular Disease

## 2019-02-17 ENCOUNTER — Ambulatory Visit
Admission: RE | Admit: 2019-02-17 | Discharge: 2019-02-17 | Disposition: A | Discharge status: Home / Self Care | Payer: Medicare Other | Source: Ambulatory Visit | Attending: Family Medicine | Admitting: Family Medicine

## 2019-02-17 ENCOUNTER — Other Ambulatory Visit: Payer: Self-pay

## 2019-02-17 ENCOUNTER — Other Ambulatory Visit: Payer: Self-pay | Admitting: Family Medicine

## 2019-02-17 DIAGNOSIS — Z1231 Encounter for screening mammogram for malignant neoplasm of breast: Secondary | ICD-10-CM

## 2019-02-17 LAB — HM MAMMOGRAPHY

## 2019-02-23 ENCOUNTER — Encounter: Payer: Self-pay | Admitting: Family Medicine

## 2019-02-24 ENCOUNTER — Other Ambulatory Visit: Payer: Medicare Other | Admitting: *Deleted

## 2019-02-24 ENCOUNTER — Ambulatory Visit (INDEPENDENT_AMBULATORY_CARE_PROVIDER_SITE_OTHER): Payer: Medicare Other | Admitting: Pharmacist

## 2019-02-24 ENCOUNTER — Other Ambulatory Visit: Payer: Self-pay

## 2019-02-24 ENCOUNTER — Telehealth: Payer: Self-pay | Admitting: Pharmacist

## 2019-02-24 VITALS — BP 148/80 | HR 93 | Wt 227.0 lb

## 2019-02-24 DIAGNOSIS — I5020 Unspecified systolic (congestive) heart failure: Secondary | ICD-10-CM | POA: Diagnosis not present

## 2019-02-24 DIAGNOSIS — Z79899 Other long term (current) drug therapy: Secondary | ICD-10-CM

## 2019-02-24 LAB — BASIC METABOLIC PANEL
BUN/Creatinine Ratio: 17 (ref 12–28)
BUN: 16 mg/dL (ref 8–27)
CO2: 27 mmol/L (ref 20–29)
Calcium: 9.5 mg/dL (ref 8.7–10.3)
Chloride: 99 mmol/L (ref 96–106)
Creatinine, Ser: 0.94 mg/dL (ref 0.57–1.00)
GFR calc Af Amer: 72 mL/min/{1.73_m2} (ref >59)
GFR calc non Af Amer: 62 mL/min/{1.73_m2} (ref >59)
Glucose: 161 mg/dL — ABNORMAL HIGH (ref 65–99)
Potassium: 3.4 mmol/L — ABNORMAL LOW (ref 3.5–5.2)
Sodium: 142 mmol/L (ref 134–144)

## 2019-02-24 LAB — PRO B NATRIURETIC PEPTIDE: NT-Pro BNP: 5186 pg/mL — ABNORMAL HIGH (ref 0–301)

## 2019-02-24 LAB — MAGNESIUM: Magnesium: 1.4 mg/dL — ABNORMAL LOW (ref 1.6–2.3)

## 2019-02-24 MED ORDER — MONTELUKAST SODIUM 10 MG PO TABS: 10.0000 mg | ORAL_TABLET | Freq: Every day | ORAL | 3 refills | Status: DC

## 2019-02-24 NOTE — Patient Instructions (Addendum)
As always it was great to see you today.  Please buy a new blood pressure cuff. OMRON - BP cuff brand-preferred Check blood pressure at home- please bring in records to next appointment I will call you with your lab results tomorrow  Continue taking Entresto 49/51mg  twice a day, carvedilol 3.125mg  twice a day and toresmide 20mg  daily.

## 2019-02-24 NOTE — Telephone Encounter (Signed)
Called and spoke with patient Patient last seen in 12/2018 by VS refilled singulair to preferred pharmacy per patient.

## 2019-02-24 NOTE — Addendum Note (Signed)
Addended by: Amado Coe on: 02/24/2019 03:20 PM   Modules accepted: Orders

## 2019-02-24 NOTE — [place] Notes (Signed)
Patient ID: Beth Macias                 DOB: 1950/09/07                      MRN: [id number]     HPI: Beth Macias is a 68 y.o. female patient of Dr. Kyla Balzarine referred by Richardson Dopp, PA to CVRR clinic. PMH is significant for chronic systolic CHF, hypertension, hyperlipidemia, diabetes mellitus, morbid obesity, and asthma. Patient last echo showed an EF of 30-35%. She was initially reluctant to start medications. She eventually agreed to start Entresto 26-26 mg twice a day a carvedilol 3.125mg  twice a day on 11/24/2018. She did not want to take lasix daily, but agreed to take prn. She was seen in the ED on 12/05/2018 for shortness of breath and was started on furosemide 40mg  daily. BNP at the hospital was 2,464.   Patients Entresto was eventually titrated to maximum dose and spironolactone was added. However patient began experiencing hives on her abdomen. She has had issues with hives since she was younger. Magox was held then [facility] was reduced but this did not help. She was eventually started on a steroid dos pack. Her furosemide was switched to toresmide and aldactone was stopped at last visit with Dr. Johnsie Cancel on 02/01/2019.  Patient presents to clinic today for follow up. She had labs drawn prior to appointment ( BMP, BNP and magnesium level). Labs still pending.   Patient denies dizziness, lightheadedness. Has right leg swelling. Still has wepps (hives) but not as big and they dont itch as bad post medrol dose pack. Patient thinks she need a steroid shot to get rid of them. Haven't gotten any worse.  She reports that she takes 1/2-1 tablet of toresmide. States that when she takes a whole tablet she pees all day. So if she has somewhere to go, she doesn't take it and then only takes 1/2 tablet when she gets home. Weight is done from the beginning of last month and is stable. She reports that her blood pressure machine is broken. Needs to buy a new one. She also  would like to have her thyroid function checked to make sure it is not affecting her heart.  Current HTN meds: Entresto 49/51mg  BID, carvedilol 3.125mg  BID, toresmide 20mg  daily, potassium 10MEQ daily  Family History: The patient's family history includes AAA (abdominal aortic aneurysm) in her father; Colon cancer in her maternal uncle; Crohn's disease in her paternal aunt; Diabetes in her mother; Gout in her brother and mother; Heart attack in her brother; Heart failure in her father and mother; Hypertension in her brother, father, and mother.  Social History: former smoker, neg ETOH  Diet: On a meal plan to lose weight- counts her Na intake- has been referred to medical weight management  Exercise: cannot really exert herself without SOB at this time but is very eager to start  Home BP readings:   Wt Readings from Last 3 Encounters:  01/28/19 235 lb 8 oz (106.8 kg)  01/05/19 240 lb (108.9 kg)  12/30/18 232 lb (105.2 kg)   BP Readings from Last 3 Encounters:  01/05/19 125/80  12/30/18 126/78  12/24/18 122/90   Pulse Readings from Last 3 Encounters:  01/05/19 89  12/30/18 86  12/24/18 (!) 54    Renal function: CrCl cannot be calculated (Patient's most recent lab result is older than the maximum 21 days allowed.).  Past Medical History:  Diagnosis Date  . Asthma   . Blood in stool   . Colon polyps   . Diabetes mellitus without complication ([place])   . Diverticulitis   . Fibromyalgia   . GERD (gastroesophageal reflux disease)   . Gout   . Hypercholesteremia   . Hypertension   . Pneumonia   . Pneumonia   . UTI (urinary tract infection)     Current Outpatient Medications on File Prior to Visit  Medication Sig Dispense Refill  . budesonide-formoterol (SYMBICORT) 80-4.5 MCG/ACT inhaler Inhale 2 puffs into the lungs 2 (two) times daily. 1 Inhaler 11  . carvedilol (COREG) 3.125 MG tablet Take 1 tablet (3.125 mg total) by mouth 2 (two) times daily. 180 tablet 3  .  cetirizine (ZYRTEC) 10 MG tablet Take 10 mg by mouth daily.    . Cholecalciferol (VITAMIN D3) 125 MCG (5000 UT) CAPS Take 1,000 Units by mouth at bedtime.     . fluticasone (FLONASE) 50 MCG/ACT nasal spray Place 1 spray into both nostrils daily. 16 g 5  . glucose blood (ONETOUCH ULTRA) test strip Use to check blood sugar 4-5 times daily 50 each 5  . ipratropium-albuterol (DUONEB) 0.5-2.5 (3) MG/3ML SOLN Take 3 mLs by nebulization every 6 (six) hours as needed.    . magnesium 30 MG tablet Take 400 mg by mouth daily.    . methylPREDNISolone (MEDROL DOSEPAK) 4 MG TBPK tablet Take 1 dose daily as directed on the packaging 1 each 0  . montelukast (SINGULAIR) 10 MG tablet Take 10 mg by mouth daily.    Marland Kitchen omeprazole (PRILOSEC) 40 MG capsule Take 1 capsule (40 mg total) by mouth daily. 30 capsule 2  . potassium chloride (K-DUR) 10 MEQ tablet Take 1 tablet (10 mEq total) by mouth daily. 30 tablet 11  . PROAIR HFA 108 (90 Base) MCG/ACT inhaler Inhale 2 puffs into the lungs every 4 (four) hours as needed for wheezing.   9  . sacubitril-valsartan (ENTRESTO) 49-51 MG Take 1 tablet by mouth 2 (two) times daily. 60 tablet 11  . torsemide (DEMADEX) 20 MG tablet Take 1 tablet (20 mg total) by mouth daily. 90 tablet 3  . vitamin C (ASCORBIC ACID) 500 MG tablet Take 500 mg by mouth daily.     No current facility-administered medications on file prior to visit.     Allergies  Allergen Reactions  . Penicillins Itching    Has patient had a PCN reaction causing immediate rash, facial/tongue/throat swelling, SOB or lightheadedness with hypotension: No Has patient had a PCN reaction causing severe rash involving mucus membranes or skin necrosis: No Has patient had a PCN reaction that required hospitalization: Unknown Has patient had a PCN reaction occurring within the last 10 years: No If all of the above answers are "NO", then may proceed with Cephalosporin use.  . Tape Other (See Comments)    Blisters  .  Gabapentin Itching, Rash and Other (See Comments)    Incoherent    There were no vitals taken for this visit.   Assessment/Plan:  1. CHF medication titration- Patients blood pressure is elevated in clinic today. She has swelling in her right leg, however she hasn't taken her torsemide today yet. Her weight is down. Breathing is better (her breathing is multifactorial including pulmonary/allergy issues as well) She had issues with low blood pressure on the higher dose of Entresto. I have recommended the patient purchase an OMRON upper arm blood pressure monitor online to monitor her blood pressure at home.  We will do a telephone visit in 2 weeks. If her blood pressure is still running high, will need to consider restarting spironolactone.   2. Hives: Patient has a long history of hives. Once got hives and throat swelling after eating a strawberry that was not washed (she has eaten strawberries in the past and future without issue). I think she may just need a stronger course of steroids to get rid of them. The fact that her hives have not gotten worse after stopping her steroid dose pack makes me believe that this is not being caused by medications. She wishes to get a steroid injection. We cannot do that here in clinic. I advised her to call her PCP to discuss this. She will need to keep a close eye on her blood sugars and we discussed this.  3. Labs: Patient also requests to check on her thyroid function. I do not see a recent TSH. Unfortunately I wasn't able to add on TSH to her labs drawn this AM. Patient will come next week to get lab drawn.  Thank you  Ramond Dial, Pharm.D, [place]  [id number], [place], [place] [address]  Phone: [phone]; Fax: [phone]

## 2019-02-24 NOTE — Telephone Encounter (Signed)
Saw patient in clinic today. States she needs a refill on her singular. Asked pharmacy to contact you, but hasnt heard anything.

## 2019-03-02 ENCOUNTER — Other Ambulatory Visit: Payer: Self-pay

## 2019-03-02 ENCOUNTER — Other Ambulatory Visit: Payer: Medicare Other | Admitting: *Deleted

## 2019-03-02 DIAGNOSIS — I5020 Unspecified systolic (congestive) heart failure: Secondary | ICD-10-CM

## 2019-03-02 LAB — TSH: TSH: 0.552 u[IU]/mL (ref 0.450–4.500)

## 2019-03-03 ENCOUNTER — Telehealth: Payer: Self-pay | Admitting: Pharmacist

## 2019-03-03 NOTE — Telephone Encounter (Signed)
Informed patient that her TSH was normal. Patient complains of increased diarrhea. We recently increased Magnesium supplements. Advised to decrease back to 1 a day as this is most likely the cause. Virtual visit already set for 10/15

## 2019-03-07 ENCOUNTER — Encounter: Payer: Self-pay | Admitting: Family Medicine

## 2019-03-07 ENCOUNTER — Ambulatory Visit (INDEPENDENT_AMBULATORY_CARE_PROVIDER_SITE_OTHER): Payer: Medicare Other | Admitting: Family Medicine

## 2019-03-07 ENCOUNTER — Other Ambulatory Visit: Payer: Self-pay

## 2019-03-07 VITALS — BP 124/76 | HR 90 | Temp 97.3°F | Resp 16 | Ht 60.0 in | Wt 228.7 lb

## 2019-03-07 DIAGNOSIS — L509 Urticaria, unspecified: Secondary | ICD-10-CM | POA: Diagnosis not present

## 2019-03-07 DIAGNOSIS — Z23 Encounter for immunization: Secondary | ICD-10-CM | POA: Diagnosis not present

## 2019-03-07 MED ORDER — METHYLPREDNISOLONE ACETATE 80 MG/ML IJ SUSP
80.0000 mg | Freq: Once | INTRAMUSCULAR | Status: AC
  Administered 2019-03-07: 80 mg via INTRAMUSCULAR

## 2019-03-07 NOTE — Progress Notes (Signed)
Subjective:     Patient ID: Beth Macias, female   DOB: 02/04/1951, 68 y.o.   MRN: [id number]  HPI  Patient is here to evaluate recurrent urticaria.  She states he has had these for about 35 years.  She has been allergy tested and is allergic to multiple things including dust and mold and possible several food allergies.  She thinks her current hives are related to torsemide.  She was taken off furosemide.  She has discussed this with cardiology.  They prefer that she stay on the torsemide.  She takes Zyrtec 10 mg daily but has not had good response with antihistamines in the past.  She states that systemic steroids are the only thing that will help to get rid of this for her in the past.  She does have history of prediabetes.  She recently had oral prednisone taper which helped reduce her hives but did not get rid of them.  No history of angioedema.  She has history of systolic heart failure and is on several medications for that.  No recent angioedema symptoms  Past Medical History:  Diagnosis Date  . Asthma   . Blood in stool   . Colon polyps   . Diabetes mellitus without complication ([place])   . Diverticulitis   . Fibromyalgia   . GERD (gastroesophageal reflux disease)   . Gout   . Hypercholesteremia   . Hypertension   . Pneumonia   . Pneumonia   . UTI (urinary tract infection)    Past Surgical History:  Procedure Laterality Date  . APPENDECTOMY    . CESAREAN SECTION     x 3  . LIPOMA EXCISION    . MENISCUS REPAIR Left   . OVARIAN CYST REMOVAL    . OVARIAN CYST SURGERY      reports that she quit smoking about 42 years ago. Her smoking use included cigarettes. She has a 2.25 pack-year smoking history. She has never used smokeless tobacco. She reports that she does not drink alcohol or use drugs. family history includes AAA (abdominal aortic aneurysm) in her father; Colon cancer in her maternal uncle; Crohn's disease in her paternal aunt; Diabetes in her  mother; Gout in her brother and mother; Heart attack in her brother; Heart failure in her father and mother; Hypertension in her brother, father, and mother. Allergies  Allergen Reactions  . Strawberry (Diagnostic) Anaphylaxis and Other (See Comments)    hives  . Penicillins Itching    Has patient had a PCN reaction causing immediate rash, facial/tongue/throat swelling, SOB or lightheadedness with hypotension: No Has patient had a PCN reaction causing severe rash involving mucus membranes or skin necrosis: No Has patient had a PCN reaction that required hospitalization: Unknown Has patient had a PCN reaction occurring within the last 10 years: No If all of the above answers are "NO", then may proceed with Cephalosporin use.  . Tape Other (See Comments)    Blisters  . Gabapentin Itching, Rash and Other (See Comments)    Incoherent    Review of Systems  Constitutional: Negative for chills and fever.  Skin: Positive for rash.       Objective:   Physical Exam Constitutional:      Appearance: Normal appearance.  Cardiovascular:     Rate and Rhythm: Normal rate and regular rhythm.  Skin:    Findings: Rash present.     Comments: He has some raised urticarial type lesions lower abdominal region.  Sparing of the arms  and back as well as head neck  Neurological:     Mental Status: She is alert.        Assessment:     Longstanding history of recurrent urticaria.  This has been worked up through allergy in the past.  Question reaction with demadex   no angioedema symptoms    Plan:     -Suggest that she try H2 blocker such as Pepcid 20 mg twice daily in combination with her Zyrtec -Depo-Medrol 80 mg IM given.  She is aware this may worsen her blood sugar levels -Follow-up with primary if symptoms persist -Flu vaccine given  Eulas Post MD [facility]

## 2019-03-07 NOTE — Patient Instructions (Signed)
Hives Hives (urticaria) are itchy, red, swollen areas on the skin. Hives can appear on any part of the body. Hives often fade within 24 hours (acute hives). Sometimes, new hives appear after old ones fade and the cycle can continue for several days or weeks (chronic hives). Hives do not spread from person to person (are not contagious). Hives come from the body's reaction to something a person is allergic to (allergen), something that causes irritation, or various other triggers. When a person is exposed to a trigger, his or her body releases a chemical (histamine) that causes redness, itching, and swelling. Hives can appear right after exposure to a trigger or hours later. What are the causes? This condition may be caused by:  Allergies to foods or ingredients.  Insect bites or stings.  Exposure to pollen or pets.  Contact with latex or chemicals.  Spending time in sunlight, heat, or cold (exposure).  Exercise.  Stress.  Certain medicines. You can also get hives from other medical conditions and treatments, such as:  Viruses, including the common cold.  Bacterial infections, such as urinary tract infections and strep throat.  Certain medicines.  Allergy shots.  Blood transfusions. Sometimes, the cause of this condition is not known (idiopathic hives). What increases the risk? You are more likely to develop this condition if you:  Are a woman.  Have food allergies, especially to citrus fruits, milk, eggs, peanuts, tree nuts, or shellfish.  Are allergic to: ? Medicines. ? Latex. ? Insects. ? Animals. ? Pollen. What are the signs or symptoms? Common symptoms of this condition include raised, itchy, red or white bumps or patches on your skin. These areas may:  Become large and swollen (welts).  Change in shape and location, quickly and repeatedly.  Be separate hives or connect over a large area of skin.  Sting or become painful.  Turn white when pressed in the  center (blanch). In severe cases, yourhands, feet, and face may also become swollen. This may occur if hives develop deeper in your skin. How is this diagnosed? This condition may be diagnosed by your symptoms, medical history, and physical exam.  Your skin, urine, or blood may be tested to find out what is causing your hives and to rule out other health issues.  Your health care provider may also remove a small sample of skin from the affected area and examine it under a microscope (biopsy). How is this treated? Treatment for this condition depends on the cause and severity of your symptoms. Your health care provider may recommend using cool, wet cloths (cool compresses) or taking cool showers to relieve itching. Treatment may include:  Medicines that help: ? Relieve itching (antihistamines). ? Reduce swelling (corticosteroids). ? Treat infection (antibiotics).  An injectable medicine (omalizumab). Your health care provider may prescribe this if you have chronic idiopathic hives and you continue to have symptoms even after treatment with antihistamines. Severe cases may require an emergency injection of adrenaline (epinephrine) to prevent a life-threatening allergic reaction (anaphylaxis). Follow these instructions at home: Medicines  Take and apply over-the-counter and prescription medicines only as told by your health care provider.  If you were prescribed an antibiotic medicine, take it as told by your health care provider. Do not stop using the antibiotic even if you start to feel better. Skin care  Apply cool compresses to the affected areas.  Do not scratch or rub your skin. General instructions  Do not take hot showers or baths. This can make itching   worse.  Do not wear tight-fitting clothing.  Use sunscreen and wear protective clothing when you are outside.  Avoid any substances that cause your hives. Keep a journal to help track what causes your hives. Write down: ?  What medicines you take. ? What you eat and drink. ? What products you use on your skin.  Keep all follow-up visits as told by your health care provider. This is important. Contact a health care provider if:  Your symptoms are not controlled with medicine.  Your joints are painful or swollen. Get help right away if:  You have a fever.  You have pain in your abdomen.  Your tongue or lips are swollen.  Your eyelids are swollen.  Your chest or throat feels tight.  You have trouble breathing or swallowing. These symptoms may represent a serious problem that is an emergency. Do not wait to see if the symptoms will go away. Get medical help right away. Call your local emergency services (911 in the [place] Do not drive yourself to the hospital. Summary  Hives (urticaria) are itchy, red, swollen areas on your skin. Hives come from the body's reaction to something a person is allergic to (allergen), something that causes irritation, or various other triggers.  Treatment for this condition depends on the cause and severity of your symptoms.  Avoid any substances that cause your hives. Keep a journal to help track what causes your hives.  Take and apply over-the-counter and prescription medicines only as told by your health care provider.  Keep all follow-up visits as told by your health care provider. This is important. This information is not intended to replace advice given to you by your health care provider. Make sure you discuss any questions you have with your health care provider. Document Released: 05/12/2005 Document Revised: 11/25/2017 Document Reviewed: 11/25/2017 Elsevier Patient Education  2020 Elsevier Inc.  

## 2019-03-09 ENCOUNTER — Telehealth: Payer: Self-pay | Admitting: Pulmonary Disease

## 2019-03-09 NOTE — Telephone Encounter (Signed)
LMTCB

## 2019-03-10 ENCOUNTER — Telehealth (INDEPENDENT_AMBULATORY_CARE_PROVIDER_SITE_OTHER): Payer: Medicare Other | Admitting: Pharmacist

## 2019-03-10 ENCOUNTER — Other Ambulatory Visit: Payer: Self-pay

## 2019-03-10 DIAGNOSIS — I10 Essential (primary) hypertension: Secondary | ICD-10-CM

## 2019-03-10 DIAGNOSIS — I5020 Unspecified systolic (congestive) heart failure: Secondary | ICD-10-CM

## 2019-03-10 NOTE — Telephone Encounter (Signed)
Okay to stop symbicort and continue singulair and allegra with prn albuterol.

## 2019-03-10 NOTE — Telephone Encounter (Signed)
Spoke to patient.  Patient verbalized understanding Nothing further needed at this time

## 2019-03-10 NOTE — Telephone Encounter (Signed)
Called and spoke to patient. Patient stated she has two issues with her Symbicort.  Patient stated she doesn't like how she feels after using it and being concerned about possible thrush with the need to gargle after use. Patient stated biggest issue she has is that she can not afford it anymore. Patient stated she went to the pharmacy and it is $168. Patient reported that she recently retired and is on a fixed income. Patient stated she doesn't think she needs Symbicort but wants to follow whatever the doctor recommends. Patient stated she thinks the Singulair and allegra have been helping to maintain symptoms and is wondering if she can trial off.  Dr. Halford Chessman, please advise. Thank you!

## 2019-03-10 NOTE — Telephone Encounter (Signed)
Pt returning call.  [phone]

## 2019-03-10 NOTE — Progress Notes (Signed)
Patient ID: Beth Macias                 DOB: 06-Mar-1951                      MRN: [id number]     HPI: Beth Macias is a 68 y.o. female patient of Dr. Kyla Balzarine referred by Richardson Dopp, PA to CV[facility] clinic. PMH is significant for chronic systolic CHF, hypertension, hyperlipidemia, diabetes mellitus, morbid obesity, and asthma. Patient last echo showed an EF of 30-35%. She was initially reluctant to start medications. She eventually agreed to start Entresto 26-26 mg twice a day a carvedilol 3.117m twice a day on 11/24/2018. She did not want to take lasix daily, but agreed to take prn. She was seen in the ED on 12/05/2018 for shortness of breath and was started on furosemide 493mdaily. BNP at the hospital was 2,464.   Patients Entresto was eventually titrated to maximum dose and spironolactone was added. However patient began experiencing hives on her abdomen. She has had issues with hives since she was younger. [facility] held [facility] but this did not help. She was eventually started on a steroid dose pack. Her furosemide was switched to toresmide and aldactone was stopped at last visit with Dr. NiJohnsie Canceln 02/01/2019.   Repeat labs at last office visit showed improved, but low Mag (1.4), K 3.4, Pro BNP 5,186 (up from 3757), TSH 0.552. K supplements was increased to 20 MEQ daily and Magnesium was increased to 40039mwice a day, however patient complained of diarrhea, so dose was decreased back to 400m81mily. She was encouraged to take her diuretic everyday.  At last visit with PharmD, her blood pressure was elevated. She was asked to buy an OMRON BP cuff and start checking her blood pressure at home.  Patient was seen today via telephone. Her daughter, Beth Macias also on the call. Patient provides BP and weight for the last 3 days. She reports that her hives have actually gotten worse with the steroid shot. She expresses great frustration with taking all the  medications, her hives getting worse, not able to sleep, refuses to do sleep study at home- wants to do it in a controlled environment. She asks about a surgery that she could have so she doesn't have to take all the heart medications. Asks about when they will recheck to see how well the heart medications are working. Why more tests haven't been done.  Both patient and her daughter ask if there are other medications we can use, why she cant go back on her other BP medications. Very frustrated with all the doctors she has to see. Tired of being shipped from one doctor to another. Wants to know if [facility] has doctors that she could see all in one place. Wants to know if there are medications better to use in African Americans.  Her weight is up 5lb from yesterday. She only took 1/2 of torsemide yesterday because she went to the doctor in the AM.   Current HTN meds: Entresto 49/51mg22m, carvedilol 3.125mg 32m toresmide 20mg d55m, potassium 20MEQ daily  Family History: The patient's family history includes AAA (abdominal aortic aneurysm) in her father; Colon cancer in her maternal uncle; Crohn's disease in her paternal aunt; Diabetes in her mother; Gout in her brother and mother; Heart attack in her brother; Heart failure in her father and mother; Hypertension in her brother, father, and  mother.  Social History: former smoker, neg ETOH  Diet: On a meal plan to lose weight- counts her Na intake- has been referred to medical weight management  Exercise: cannot really exert herself without SOB at this time but is very eager to start  Home BP readings:  10/12: 124/60 HR 90 weight 228 10/14: 145/96 HR 89 Weight 227lb 10/15: 146/97 HR 95 weight 232 lb  Wt Readings from Last 3 Encounters:  03/07/19 228 lb 11.2 oz (103.7 kg)  02/24/19 227 lb (103 kg)  01/28/19 235 lb 8 oz (106.8 kg)   BP Readings from Last 3 Encounters:  03/07/19 124/76  02/24/19 (!) 148/80  01/05/19 125/80   Pulse Readings from  Last 3 Encounters:  03/07/19 90  02/24/19 93  01/05/19 89    Renal function: Estimated Creatinine Clearance: 61.3 mL/min (by C-G formula based on SCr of 0.94 mg/dL).  Past Medical History:  Diagnosis Date  . Asthma   . Blood in stool   . Colon polyps   . Diabetes mellitus without complication ([place])   . Diverticulitis   . Fibromyalgia   . GERD (gastroesophageal reflux disease)   . Gout   . Hypercholesteremia   . Hypertension   . Pneumonia   . Pneumonia   . UTI (urinary tract infection)     Current Outpatient Medications on File Prior to Visit  Medication Sig Dispense Refill  . budesonide-formoterol (SYMBICORT) 80-4.5 MCG/ACT inhaler Inhale 2 puffs into the lungs 2 (two) times daily. 1 Inhaler 11  . carvedilol (COREG) 3.125 MG tablet Take 1 tablet (3.125 mg total) by mouth 2 (two) times daily. 180 tablet 3  . cetirizine (ZYRTEC) 10 MG tablet Take 10 mg by mouth daily.    . Cholecalciferol (VITAMIN D3) 125 MCG (5000 UT) CAPS Take 1,000 Units by mouth at bedtime.     . fluticasone (FLONASE) 50 MCG/ACT nasal spray Place 1 spray into both nostrils daily. 16 g 5  . glucose blood (ONETOUCH ULTRA) test strip Use to check blood sugar 4-5 times daily 50 each 5  . ipratropium-albuterol (DUONEB) 0.5-2.5 (3) MG/3ML SOLN Take 3 mLs by nebulization every 6 (six) hours as needed.    . magnesium 30 MG tablet Take 400 mg by mouth daily.    . montelukast (SINGULAIR) 10 MG tablet Take 1 tablet (10 mg total) by mouth daily. 30 tablet 3  . omeprazole (PRILOSEC) 40 MG capsule Take 1 capsule (40 mg total) by mouth daily. 30 capsule 2  . potassium chloride (K-DUR) 10 MEQ tablet Take 1 tablet (10 mEq total) by mouth daily. 30 tablet 11  . PROAIR HFA 108 (90 Base) MCG/ACT inhaler Inhale 2 puffs into the lungs every 4 (four) hours as needed for wheezing.   9  . sacubitril-valsartan (ENTRESTO) 49-51 MG Take 1 tablet by mouth 2 (two) times daily. 60 tablet 11  . torsemide (DEMADEX) 20 MG tablet Take 1  tablet (20 mg total) by mouth daily. (Patient taking differently: Take 20 mg by mouth daily. Patient takes 1/2-1 tablet) 90 tablet 3  . vitamin C (ASCORBIC ACID) 500 MG tablet Take 500 mg by mouth daily.     No current facility-administered medications on file prior to visit.     Allergies  Allergen Reactions  . Strawberry (Diagnostic) Anaphylaxis and Other (See Comments)    hives  . Penicillins Itching    Has patient had a PCN reaction causing immediate rash, facial/tongue/throat swelling, SOB or lightheadedness with hypotension: No Has patient  had a PCN reaction causing severe rash involving mucus membranes or skin necrosis: No Has patient had a PCN reaction that required hospitalization: Unknown Has patient had a PCN reaction occurring within the last 10 years: No If all of the above answers are "NO", then may proceed with Cephalosporin use.  . Tape Other (See Comments)    Blisters  . Gabapentin Itching, Rash and Other (See Comments)    Incoherent    There were no vitals taken for this visit.   Assessment/Plan:  1. CHF medication titration- Patient is very frustrated with her entire situation. I spent an extensive amount of time explaining how we treat heart failure. Why we use the medications we use, the evidence for use or not to use in African Americans. Explained that there is not a surgery to fix her CHF. There does not seem to be any more tests that need to be done (as we have rules out ischemia with a myoview stress test), other than a repeat echo at some point. I have encouraged her to discuss this with Dr. Johnsie Cancel as well. I explained that she really does need to have a sleep study done as OSA could be affecting her heart as well. I also explained that even if her ejection fraction were to improve on echo that there is not good evidence that stopping her guideline directed therapy (Entresto, carvedilol) would be safe. Therefore we would need to continue. I also expressed concern  over her gaining 5lb overnight because she only took 1/2 of toresmide the day before. I expressed understanding that she cannot take before she goes to Dr. Appointments/out because she has to use the bathroom too frequently and with COVID people do not let her use the restroom, but explained that she needs to take an entire tablet when she gets home. I also advised that she take 1.5 tablets today. Patient was agreeable. Her blood pressure at PCP was good, however at home it is elevated and her HR has room to increase carvedilol. However, patient is not agreeable to this at this time. She still seems to be convinced that the medication is causing her hives and making her feel poorly and crazy. In response to her question about changing heart medications- I stated we could change carvedilol to Metoprolol XL. This actually could be a good idea, as carvedilol could be causing her to feel tired and "crazy." Not to say metoprolol won't cause the same thing. Could change Entresto to an ARB. But I did explain that [name] is the best medication for her to be on. Will discuss this all again at next telephone follow up in 3 weeks. Hopefully she will get some answers from the allergist. She also asks if it is ok for her to walk. Advised that it was ok. To start off slow. And increase as tolerated. She is asking about a walker with a seat so she can walk and then rest and then walk again. Advised for her to call her PCP about this. 2. Hives: Her hives have actually worsened about steroid injection. I have again suggested she see an allergist. Last visit she told me she already saw one several years ago. Today she seemed more willing to go. Dian Situ, PharmD student provided her with a practice in [place] that she is familiar with, as she asked for a recommendation. I explained that the timing of her reactions do not suggest to me that this is from her heart medications and unfortunatly almost  all medications have a rash  listed as an adverse event. An allergist would be much more familiar with her presentation and could help her much more as this is his area of expertise. She was agreeable to calling Dr. Burnett Kanaris office.   Thank you  Ramond Dial, Pharm.D, [place]  [id number], [place], [place] [address]  Phone: [phone]; Fax: [phone]

## 2019-03-16 ENCOUNTER — Telehealth: Payer: Self-pay | Admitting: Cardiovascular Disease

## 2019-03-16 NOTE — Telephone Encounter (Signed)
Pt calling stating that her Beth Macias has gotten too expensive to get and she would like someone to give her a call concerning this matter to see if she can get her medication cheaper or need to take something affordable. Jeani Hawking, LPN can you please advise on this matter? Thanks

## 2019-03-16 NOTE — Telephone Encounter (Signed)
Ok I will thanks

## 2019-03-16 NOTE — Telephone Encounter (Signed)
**Note De-Identified Jolanta Cabeza Obfuscation** The pt states that her [name] has gone from $45/30 day supply to $170/30 day supply and is in the "donut hole".  I gave her [name] pt asst foundation's phone number and advised her to call them with questions concerning her eligibility and to ask them to mail her an application.  She states that she has 8 days of Entresto tablets left is aware to call us once she gets down to 2 to 3 days left (hopefully she will have her application completed and can bring to the office to drop off while picking up Entresto samples) and if we have samples we will give her a bottle until her pt asst is taken care of.

## 2019-03-25 ENCOUNTER — Telehealth: Payer: Self-pay | Admitting: Cardiovascular Disease

## 2019-03-25 NOTE — Telephone Encounter (Signed)
  Patient calling the office for samples of medication:   1.  What medication and dosage are you requesting samples for? sacubitril-valsartan (ENTRESTO) 49-51 MG  2.  Are you currently out of this medication? She is completely out  There is a previous telephone note from [place] [facility] regarding patient trying to get assistance. She would like to drop off the form and pick up some samples to get her through.

## 2019-03-25 NOTE — Telephone Encounter (Signed)
I called and spoke with patient, she is aware samples are front ready for pick up.

## 2019-03-28 ENCOUNTER — Other Ambulatory Visit: Payer: Self-pay | Admitting: General Surgery

## 2019-03-28 MED ORDER — OMEPRAZOLE 40 MG PO CPDR: 40.0000 mg | DELAYED_RELEASE_CAPSULE | Freq: Every day | ORAL | 1 refills | Status: DC

## 2019-03-31 ENCOUNTER — Telehealth: Payer: Self-pay | Admitting: *Deleted

## 2019-03-31 NOTE — Telephone Encounter (Signed)
I faxed completed patient medication assistance forms to [name] for help with ENTRESTO. Provider form signed by Dr. Rayann Heman.  Received confirmation.

## 2019-04-04 ENCOUNTER — Other Ambulatory Visit: Payer: Self-pay

## 2019-04-04 ENCOUNTER — Telehealth (INDEPENDENT_AMBULATORY_CARE_PROVIDER_SITE_OTHER): Payer: Self-pay | Admitting: Pharmacist

## 2019-04-04 DIAGNOSIS — I5020 Unspecified systolic (congestive) heart failure: Secondary | ICD-10-CM

## 2019-04-04 MED ORDER — CARVEDILOL 6.25 MG PO TABS: 6.2500 mg | ORAL_TABLET | Freq: Two times a day (BID) | ORAL | 3 refills | Status: DC

## 2019-04-04 NOTE — Telephone Encounter (Signed)
**Note De-Identified Romey Mathieson Obfuscation** Letter received from [employer] stating that they have approved the pt for pt asst with Entresto. Approval good until 05/26/2019 Pt. ID: [id number]  The letter states that they have notified the pt of this approval as well.

## 2019-04-04 NOTE — Progress Notes (Signed)
Patient ID: [name]                 DOB: [birth year]                      MRN: [id number]     HPI: Beth Macias is a 68 y.o. female patient of Dr. Kyla Macias referred by Beth Dopp, PA to CV[facility] clinic. PMH is significant for chronic systolic CHF, hypertension, hyperlipidemia, diabetes mellitus, morbid obesity, and asthma. Patient last echo showed an EF of 30-35%. She was initially reluctant to start medications. She eventually agreed to start Entresto 26-26 mg twice a day a carvedilol 3.125mg  twice a day on 11/24/2018. She did not want to take lasix daily, but agreed to take prn. She was seen in the ED on 12/05/2018 for shortness of breath and was started on furosemide 40mg  daily. BNP at the hospital was 2,464.   Patients Entresto was eventually titrated to maximum dose and spironolactone was added. However patient began experiencing hives on her abdomen. She has had issues with hives since she was younger. Magox was held then [facility] was reduced but this did not help. She was eventually started on a steroid dose pack which helped initially. She also received a steroid shot, without relief. At last visit, it was again strongly recommended she see an allergist as both Dr. Johnsie Macias and myself did not feel as though this was being caused by medication.  Repeat labs at last in-office visit showed improved, but low Mag (1.4), K 3.4, Pro BNP 5,186 (up from 3757), TSH 0.552. K supplements was increased to 20 MEQ daily and Magnesium was increased to 400mg  twice a day, however patient complained of diarrhea, so dose was decreased back to 400mg  daily. She was encouraged to take her diuretic everyday.  Last PharmD visit was conducted via telephone. Her daughter, Beth Macias, was also on the call. She reported weight gain and expressed frustration with all her medications. Her blood pressure was eleaved in the 140's. She was only taking 1/2 torsemide tablet on days that she left the house  due to increased frequency of urination. A lot of questions about medication concerns were answered. Although patient still appeared frustrated with the situation and no medication changes were made expect to take and extra torsemide that day due to weight gain of 5lb.   Since the last visit, patient has hit the "donut hole" and she has applied for patient assistance through [employer]. Still waiting on determination.  Patient called today via telephone. She states that her fluid status is variable, but that her weight is steady. She is still skipping doses of torsemide or only taking 1/2 a tablet on days she has to go out. She refuses to take a whole tablet on those days as it runs her to the bathroom. She states she sleeps with 6-8 pillows. Attributes diffulty sleeping to her GERD and post nasal drip.   She did go to an allergist who made some changes to her allergy medications (stopped zyrtec and allegra and started levocetrizine and increased pepcid to 40mg  twice a day) Also gave her mometasone furoate cream for her hives. This has helped some. Says they allergy tested her again. Was allergic to trees, grass, shelfish, and a multitude of other things.  She has not been checking her blood pressure because she has been out of town. Blood pressure today was 162/109 HR 97 while on the phone. Reading previous was 145/96 HR  95. Last few weights were between 221-223.   Current HTN meds: Entresto 49/51mg  BID, carvedilol 3.125mg  BID, toresmide 20mg  daily, potassium 20MEQ daily  Family History: The patient's family history includes AAA (abdominal aortic aneurysm) in her father; Colon cancer in her maternal uncle; Crohn's disease in her paternal aunt; Diabetes in her mother; Gout in her brother and mother; Heart attack in her brother; Heart failure in her father and mother; Hypertension in her brother, father, and mother.  Social History: former smoker, neg ETOH  Diet: On a meal plan to lose weight- counts  her Na intake- has been referred to medical weight management  Exercise: cannot really exert herself without SOB at this time but is very eager to start  Home BP readings:    Wt Readings from Last 3 Encounters:  03/07/19 228 lb 11.2 oz (103.7 kg)  02/24/19 227 lb (103 kg)  01/28/19 235 lb 8 oz (106.8 kg)   BP Readings from Last 3 Encounters:  03/07/19 124/76  02/24/19 (!) 148/80  01/05/19 125/80   Pulse Readings from Last 3 Encounters:  03/07/19 90  02/24/19 93  01/05/19 89    Renal function: CrCl cannot be calculated (Patient's most recent lab result is older than the maximum 21 days allowed.).  Past Medical History:  Diagnosis Date  . Asthma   . Blood in stool   . Colon polyps   . Diabetes mellitus without complication ([place])   . Diverticulitis   . Fibromyalgia   . GERD (gastroesophageal reflux disease)   . Gout   . Hypercholesteremia   . Hypertension   . Pneumonia   . Pneumonia   . UTI (urinary tract infection)     Current Outpatient Medications on File Prior to Visit  Medication Sig Dispense Refill  . budesonide-formoterol (SYMBICORT) 80-4.5 MCG/ACT inhaler Inhale 2 puffs into the lungs 2 (two) times daily. 1 Inhaler 11  . carvedilol (COREG) 3.125 MG tablet Take 1 tablet (3.125 mg total) by mouth 2 (two) times daily. 180 tablet 3  . cetirizine (ZYRTEC) 10 MG tablet Take 10 mg by mouth daily.    . Cholecalciferol (VITAMIN D3) 125 MCG (5000 UT) CAPS Take 1,000 Units by mouth at bedtime.     . fluticasone (FLONASE) 50 MCG/ACT nasal spray Place 1 spray into both nostrils daily. 16 g 5  . glucose blood (ONETOUCH ULTRA) test strip Use to check blood sugar 4-5 times daily 50 each 5  . ipratropium-albuterol (DUONEB) 0.5-2.5 (3) MG/3ML SOLN Take 3 mLs by nebulization every 6 (six) hours as needed.    . magnesium 30 MG tablet Take 400 mg by mouth daily.    . montelukast (SINGULAIR) 10 MG tablet Take 1 tablet (10 mg total) by mouth daily. 30 tablet 3  . omeprazole  (PRILOSEC) 40 MG capsule Take 1 capsule (40 mg total) by mouth daily. 30 capsule 1  . potassium chloride (K-DUR) 10 MEQ tablet Take 1 tablet (10 mEq total) by mouth daily. 30 tablet 11  . PROAIR HFA 108 (90 Base) MCG/ACT inhaler Inhale 2 puffs into the lungs every 4 (four) hours as needed for wheezing.   9  . sacubitril-valsartan (ENTRESTO) 49-51 MG Take 1 tablet by mouth 2 (two) times daily. 60 tablet 11  . torsemide (DEMADEX) 20 MG tablet Take 1 tablet (20 mg total) by mouth daily. (Patient taking differently: Take 20 mg by mouth daily. Patient takes 1/2-1 tablet) 90 tablet 3  . vitamin C (ASCORBIC ACID) 500 MG tablet Take 500  mg by mouth daily.     No current facility-administered medications on file prior to visit.     Allergies  Allergen Reactions  . Strawberry (Diagnostic) Anaphylaxis and Other (See Comments)    hives  . Penicillins Itching    Has patient had a PCN reaction causing immediate rash, facial/tongue/throat swelling, SOB or lightheadedness with hypotension: No Has patient had a PCN reaction causing severe rash involving mucus membranes or skin necrosis: No Has patient had a PCN reaction that required hospitalization: Unknown Has patient had a PCN reaction occurring within the last 10 years: No If all of the above answers are "NO", then may proceed with Cephalosporin use.  . Tape Other (See Comments)    Blisters  . Gabapentin Itching, Rash and Other (See Comments)    Incoherent    There were no vitals taken for this visit.   Assessment/Plan:  1. CHF medication titration-. Patients blood pressure is elevated and CHF medications are not at optimal doses. Discussed increasing carvedilol to 6.25mg  twice a day. Discussed the possibility she could be slightly more fatigued for a week or two. Patient willing to increase, but will call if there are issues. Increase carvedilol to 6.25mg  twice a day, Entresto 49/51mg  twice a day and torsemide 20mg  daily. Follow up with Dr. Johnsie Macias  on Dec 4. We will be happy to see patient back in clinic after if needed. Will do a BMP and Mg prior to appointment on Dec 4. We discussed the importance of taking 20mg  of torsemide daily. Patient refuses to take when she goes out. I have asked her to continue to monitor her weight daily. If she skips or take 1/2 dose and she gains more than 3 pounds the next day, she is to take an extra 1/2 tablet that day. Patient states that her swelling is fine (always has swelling) and can do her activities of daily living without getting short of breath. Will consider 221 her dry weight. 2. Hives: Improved and changes to her allergy medications have been made. Patient should continue to follow with allergist.  Thank you  Ramond Dial, Pharm.D, [place]  [id number], [place], [place] [address]  Phone: [phone]; Fax: [phone]

## 2019-04-26 NOTE — Progress Notes (Signed)
Date:  04/29/2019   ID:  Beth Macias, DOB 1951/04/13, MRN [id number]  Provider Location: Office  PCP:  Billie Ruddy, MD  Cardiologist:   Johnsie Cancel Electrophysiologist:  None   Evaluation Performed:  Follow-Up Visit  Chief Complaint:  CHF  History of Present Illness:    Beth Macias is a 68 y.o. female who was first seen January 23,2020   for consultation regarding chest pain. Referred by Lennette Bihari [facility] and Jaclyn Shaggy [facility]  Reviewed ER visit notes from 04/07/18  Lots of URI, congestive symptoms  And urinary frequency. BP was elevated in ER Had abnormal ECG with no old one to compare  Reviewed ECG from 04/07/18 SR PVC isolated LAD and poor R wave progression no acute ST changes Troponin negative x 2 CXR with CE ligular airspace disease could Be scarring , atelectasis or early pneumonia. F/U CXR done 04/26/18 NAD.     Echo done 11/16/18 reviewed EF 30-35% with moderate MR Small to moderate sized pericardial effusion Myovue done 12/13/18 normal perfusion EF 32%   Has had issues with hives since her 30's They got worse when she was started on pulmonary and CHF meds. Not clear culprit. But improved and not thought to be from heart meds. Seen 04/01/19 pharm D and entresto dose kept with increase in coreg. Patient does not like taking diuretic when she goes out Last BNP elevated 5186 02/24/19 prior to med changes K 3.4 replacement increased and Cr normal 0.94 She also indicated unable to tolerate highest dose of entresto   She is upset about donut hole and cost of entresto Taking melatonin to sleep and another supplement OTC to "coax" her BS down     The patient  does not have symptoms concerning for COVID-19 infection (fever, chills, cough, or new shortness of breath).    Past Medical History:  Diagnosis Date  . Asthma   . Blood in stool   . Colon polyps   . Diabetes mellitus without complication ([place])   . Diverticulitis   . Fibromyalgia    . GERD (gastroesophageal reflux disease)   . Gout   . Hypercholesteremia   . Hypertension   . Pneumonia   . Pneumonia   . UTI (urinary tract infection)    Past Surgical History:  Procedure Laterality Date  . APPENDECTOMY    . CESAREAN SECTION     x 3  . LIPOMA EXCISION    . MENISCUS REPAIR Left   . OVARIAN CYST REMOVAL    . OVARIAN CYST SURGERY       Current Meds  Medication Sig  . budesonide-formoterol (SYMBICORT) 80-4.5 MCG/ACT inhaler Inhale 2 puffs into the lungs 2 (two) times daily.  . carvedilol (COREG) 6.25 MG tablet Take 1 tablet (6.25 mg total) by mouth 2 (two) times daily.  . cetirizine (ZYRTEC) 10 MG tablet Take 10 mg by mouth daily.  . Cholecalciferol (VITAMIN D3) 125 MCG (5000 UT) CAPS Take 1,000 Units by mouth at bedtime.   . fluticasone (FLONASE) 50 MCG/ACT nasal spray Place 1 spray into both nostrils daily.  Marland Kitchen glucose blood (ONETOUCH ULTRA) test strip Use to check blood sugar 4-5 times daily  . ipratropium-albuterol (DUONEB) 0.5-2.5 (3) MG/3ML SOLN Take 3 mLs by nebulization every 6 (six) hours as needed.  Marland Kitchen levocetirizine (XYZAL) 5 MG tablet Take 5 mg by mouth every evening.  . magnesium 30 MG tablet Take 400 mg by mouth daily.  . mometasone (ELOCON) 0.1 %  cream Apply 1 application topically daily.  . montelukast (SINGULAIR) 10 MG tablet Take 1 tablet (10 mg total) by mouth daily.  Marland Kitchen omeprazole (PRILOSEC) 40 MG capsule Take 1 capsule (40 mg total) by mouth daily.  . potassium chloride (K-DUR) 10 MEQ tablet Take 1 tablet (10 mEq total) by mouth daily.  Marland Kitchen PROAIR HFA 108 (90 Base) MCG/ACT inhaler Inhale 2 puffs into the lungs every 4 (four) hours as needed for wheezing.   . sacubitril-valsartan (ENTRESTO) 49-51 MG Take 1 tablet by mouth 2 (two) times daily.  . vitamin C (ASCORBIC ACID) 500 MG tablet Take 500 mg by mouth daily.     Allergies:   Strawberry (diagnostic), Penicillins, Tape, and Gabapentin   Social History   Tobacco Use  . Smoking status: Former  Smoker    Packs/day: 0.25    Years: 9.00    Pack years: 2.25    Types: Cigarettes    Quit date: 05/26/1976    Years since quitting: 42.9  . Smokeless tobacco: Never Used  Substance Use Topics  . Alcohol use: Never    Frequency: Never  . Drug use: Never     Family Hx: The patient's family history includes AAA (abdominal aortic aneurysm) in her father; Colon cancer in her maternal uncle; Crohn's disease in her paternal aunt; Diabetes in her mother; Gout in her brother and mother; Heart attack in her brother; Heart failure in her father and mother; Hypertension in her brother, father, and mother. There is no history of Breast cancer.  ROS:   Please see the history of present illness.     All other systems reviewed and are negative.   Prior CV studies:   The following studies were reviewed today:  Echo 11/16/18 Myovue 12/13/18  Labs/Other Tests and Data Reviewed:    EKG:  Not done today   Recent Labs: 12/05/2018: B Natriuretic Peptide 2,464.6; Hemoglobin 13.7; Platelets 274 12/10/2018: ALT 15 02/24/2019: BUN 16; Creatinine, Ser 0.94; Magnesium 1.4; NT-Pro BNP 5,186; Potassium 3.4; Sodium 142 03/02/2019: TSH 0.552   Recent Lipid Panel No results found for: CHOL, TRIG, HDL, CHOLHDL, LDLCALC, LDLDIRECT  Wt Readings from Last 3 Encounters:  04/29/19 217 lb (98.4 kg)  03/07/19 228 lb 11.2 oz (103.7 kg)  02/24/19 227 lb (103 kg)     Objective:    Vital Signs:  BP 140/90   Pulse 97   Ht 5' (1.524 m)   Wt 217 lb (98.4 kg)   SpO2 97%   BMI 42.38 kg/m    Affect appropriate Healthy:  appears stated age HEENT: normal Neck supple with no adenopathy JVP normal no bruits no thyromegaly Lungs clear with no wheezing and good diaphragmatic motion Heart:  S1/S2 no murmur, no rub, gallop or click PMI normal Abdomen: benighn, BS positve, no tenderness, no AAA no bruit.  No HSM or HJR Distal pulses intact with no bruits No edema Neuro non-focal Skin warm and dry No muscular  weakness   ASSESSMENT & PLAN:    CHF:  Non ischemic DCM continue coreg, and entresto On demedex ( hives when on lasix/aldactone)  Needs f/u labs today. Will see Pharm D in 2-3 weeks to see if coreg needs to be titrated up. She does not Want to try higher dose entresto again despite BP being on high side.   Pulmonary:  F/u Wert continue symbicort and pro Air finished with dose pack She is concerned about blood clots in legs I told her edema more likely from weight  and CHF. She prefers to have f/u LE venous duplex bilateral to r/o DVT   GERD:  Continue prilosec   COVID-19 Education: The signs and symptoms of COVID-19 were discussed with the patient and how to seek care for testing (follow up with PCP or arrange E-visit).  The importance of social distancing was discussed today.   Medication Adjustments/Labs and Tests Ordered: Current medicines are reviewed at length with the patient today.  Concerns regarding medicines are outlined above.   Tests Ordered:  BMET/BNP LE Venous duplex   Medication Changes:  None to see Pharm D 2-3 weeks ? Titrate coreg and me in 3 months    Signed, Jenkins Rouge, MD  04/29/2019 2:47 PM    [place]

## 2019-04-29 ENCOUNTER — Encounter: Payer: Self-pay | Admitting: Cardiovascular Disease

## 2019-04-29 ENCOUNTER — Ambulatory Visit (INDEPENDENT_AMBULATORY_CARE_PROVIDER_SITE_OTHER): Payer: Medicare Other | Admitting: Cardiovascular Disease

## 2019-04-29 ENCOUNTER — Ambulatory Visit (HOSPITAL_COMMUNITY)
Admission: RE | Admit: 2019-04-29 | Discharge: 2019-04-29 | Disposition: A | Discharge status: Home / Self Care | Payer: Medicare Other | Source: Ambulatory Visit | Attending: Cardiovascular Disease | Admitting: Cardiovascular Disease

## 2019-04-29 ENCOUNTER — Other Ambulatory Visit: Payer: Self-pay

## 2019-04-29 VITALS — BP 140/90 | HR 97 | Ht 60.0 in | Wt 217.0 lb

## 2019-04-29 DIAGNOSIS — R2243 Localized swelling, mass and lump, lower limb, bilateral: Secondary | ICD-10-CM

## 2019-04-29 DIAGNOSIS — I509 Heart failure, unspecified: Secondary | ICD-10-CM

## 2019-04-29 NOTE — Patient Instructions (Addendum)
Medication Instructions:   *If you need a refill on your cardiac medications before your next appointment, please call your pharmacy*  Lab Work: Your physician recommends that you have lab work today- BMET and BNP.  If you have labs (blood work) drawn today and your tests are completely normal, you will receive your results only by: Marland Kitchen MyChart Message (if you have MyChart) OR . A paper copy in the mail If you have any lab test that is abnormal or we need to change your treatment, we will call you to review the results.  Testing/Procedures:  Your physician has requested that you have a lower extremity venous duplex. This test is an ultrasound of the veins in the legs. It looks at venous blood flow that carries blood from the heart to the legs . Allow one hour for a Lower Venous exam. There are no restrictions or special instructions.   Follow-Up: At [facility], you and your health needs are our priority.  As part of our continuing mission to provide you with exceptional heart care, we have created designated Provider Care Teams.  These Care Teams include your primary Cardiologist (physician) and Advanced Practice Providers (APPs -  Physician Assistants and Nurse Practitioners) who all work together to provide you with the care you need, when you need it.  Your physician recommends that you schedule a follow-up appointment in: 4 to 6 weeks with pharmacy.  Your next appointment:   3 month(s)  The format for your next appointment:   In Person  Provider:   You may see Jenkins Rouge, MD or one of the following Advanced Practice Providers on your designated Care Team:    Truitt Merle, NP  Cecilie Kicks, NP  Kathyrn Drown, NP

## 2019-04-30 LAB — BASIC METABOLIC PANEL
BUN/Creatinine Ratio: 27 (ref 12–28)
BUN: 24 mg/dL (ref 8–27)
CO2: 27 mmol/L (ref 20–29)
Calcium: 10.3 mg/dL (ref 8.7–10.3)
Chloride: 99 mmol/L (ref 96–106)
Creatinine, Ser: 0.88 mg/dL (ref 0.57–1.00)
GFR calc Af Amer: 78 mL/min/{1.73_m2} (ref >59)
GFR calc non Af Amer: 67 mL/min/{1.73_m2} (ref >59)
Glucose: 109 mg/dL — ABNORMAL HIGH (ref 65–99)
Potassium: 3.4 mmol/L — ABNORMAL LOW (ref 3.5–5.2)
Sodium: 145 mmol/L — ABNORMAL HIGH (ref 134–144)

## 2019-04-30 LAB — PRO B NATRIURETIC PEPTIDE: NT-Pro BNP: 6097 pg/mL — ABNORMAL HIGH (ref 0–301)

## 2019-05-02 ENCOUNTER — Telehealth: Payer: Self-pay

## 2019-05-02 IMAGING — CR DG HIP (WITH OR WITHOUT PELVIS) 2-3V*R*
3 series · 3 of 3 positions shown · non-contrast
Comparison: None.

CLINICAL DATA: Right hip pain

EXAM:
DG HIP (WITH OR WITHOUT PELVIS) 2-3V RIGHT

[hip ap]
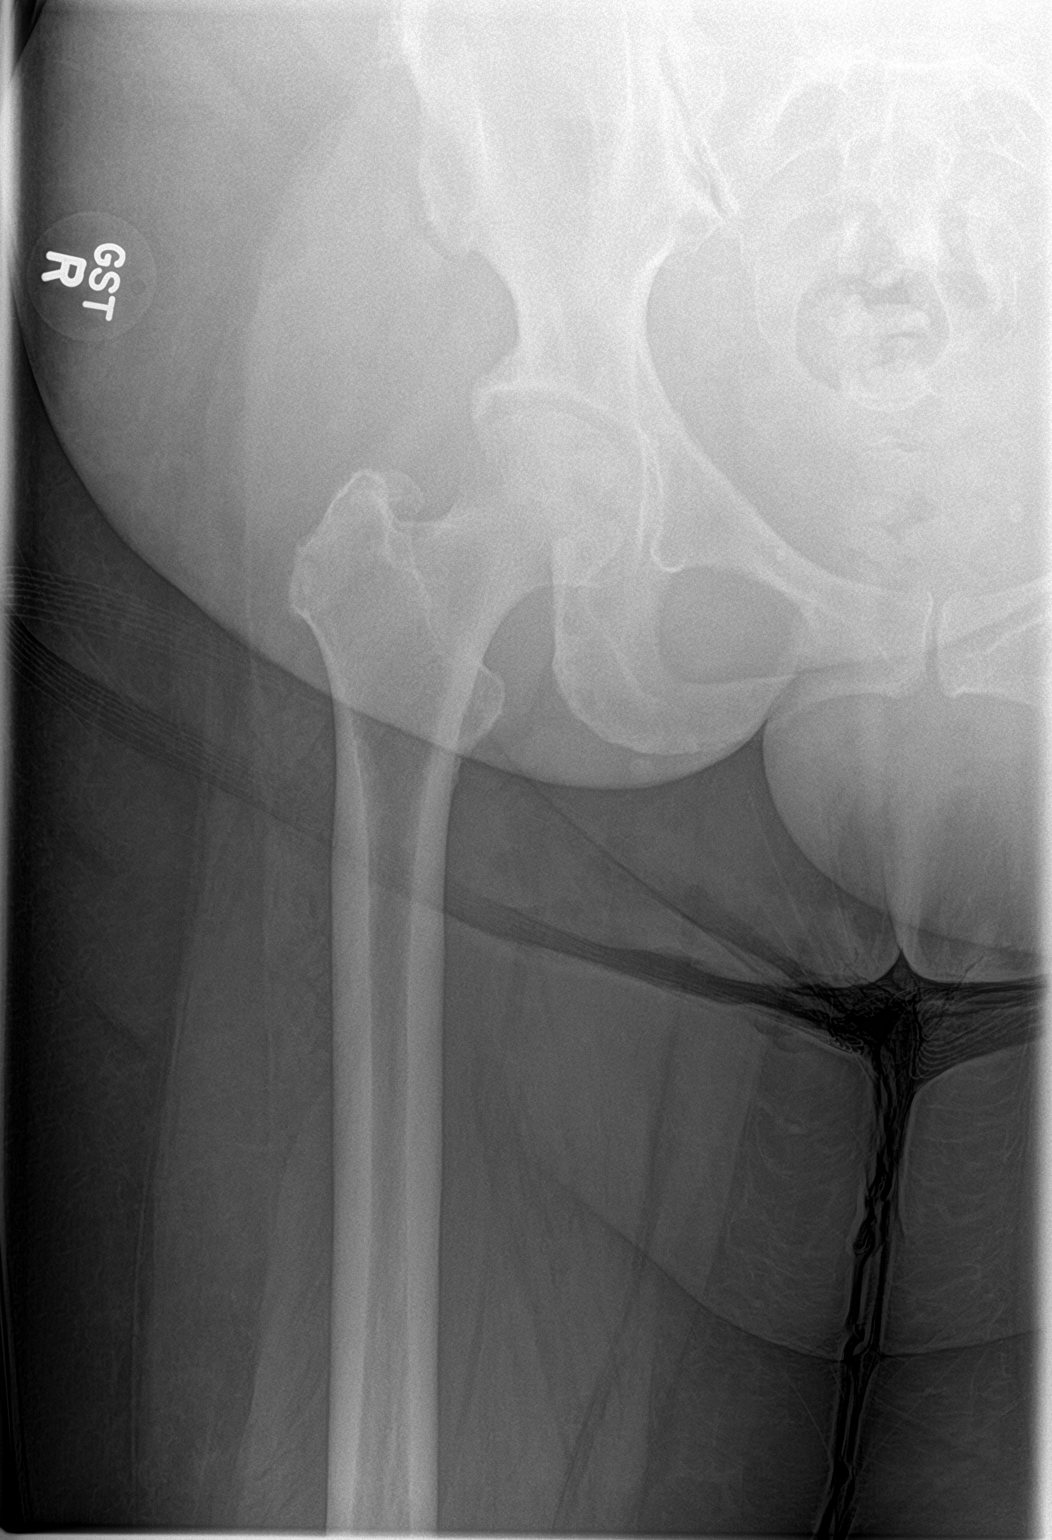

[hip lat]
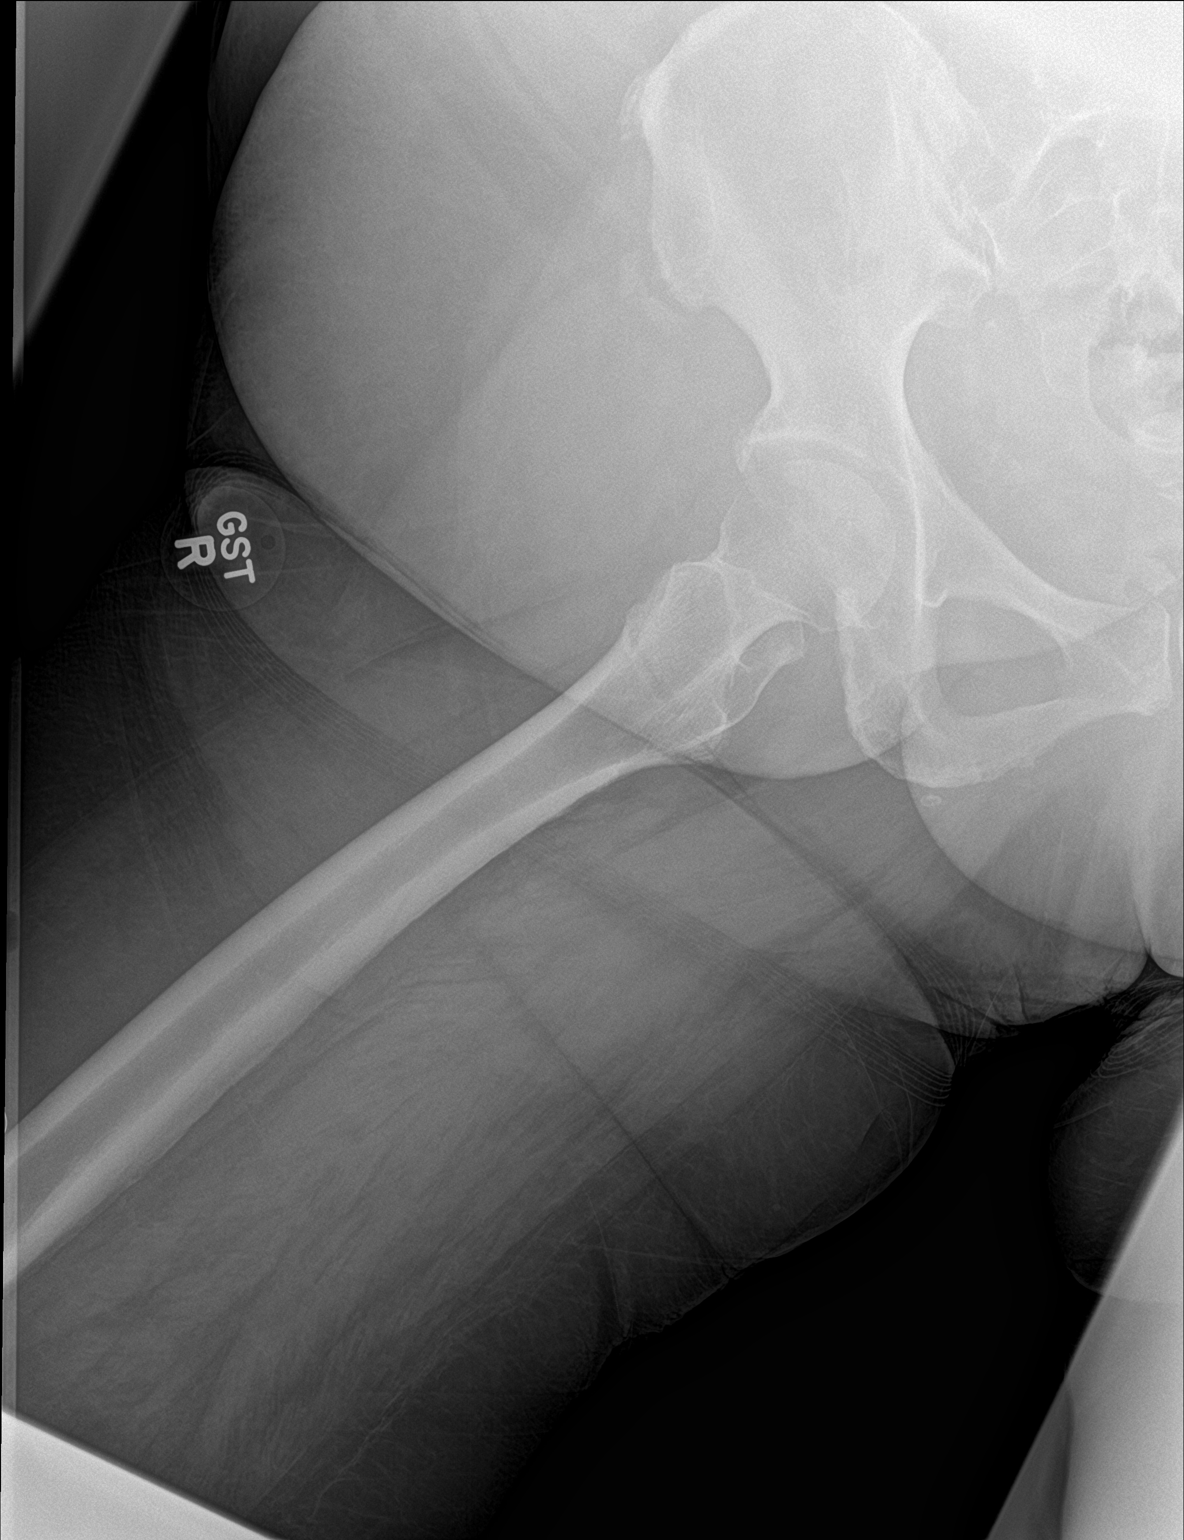

[pelvis ap]
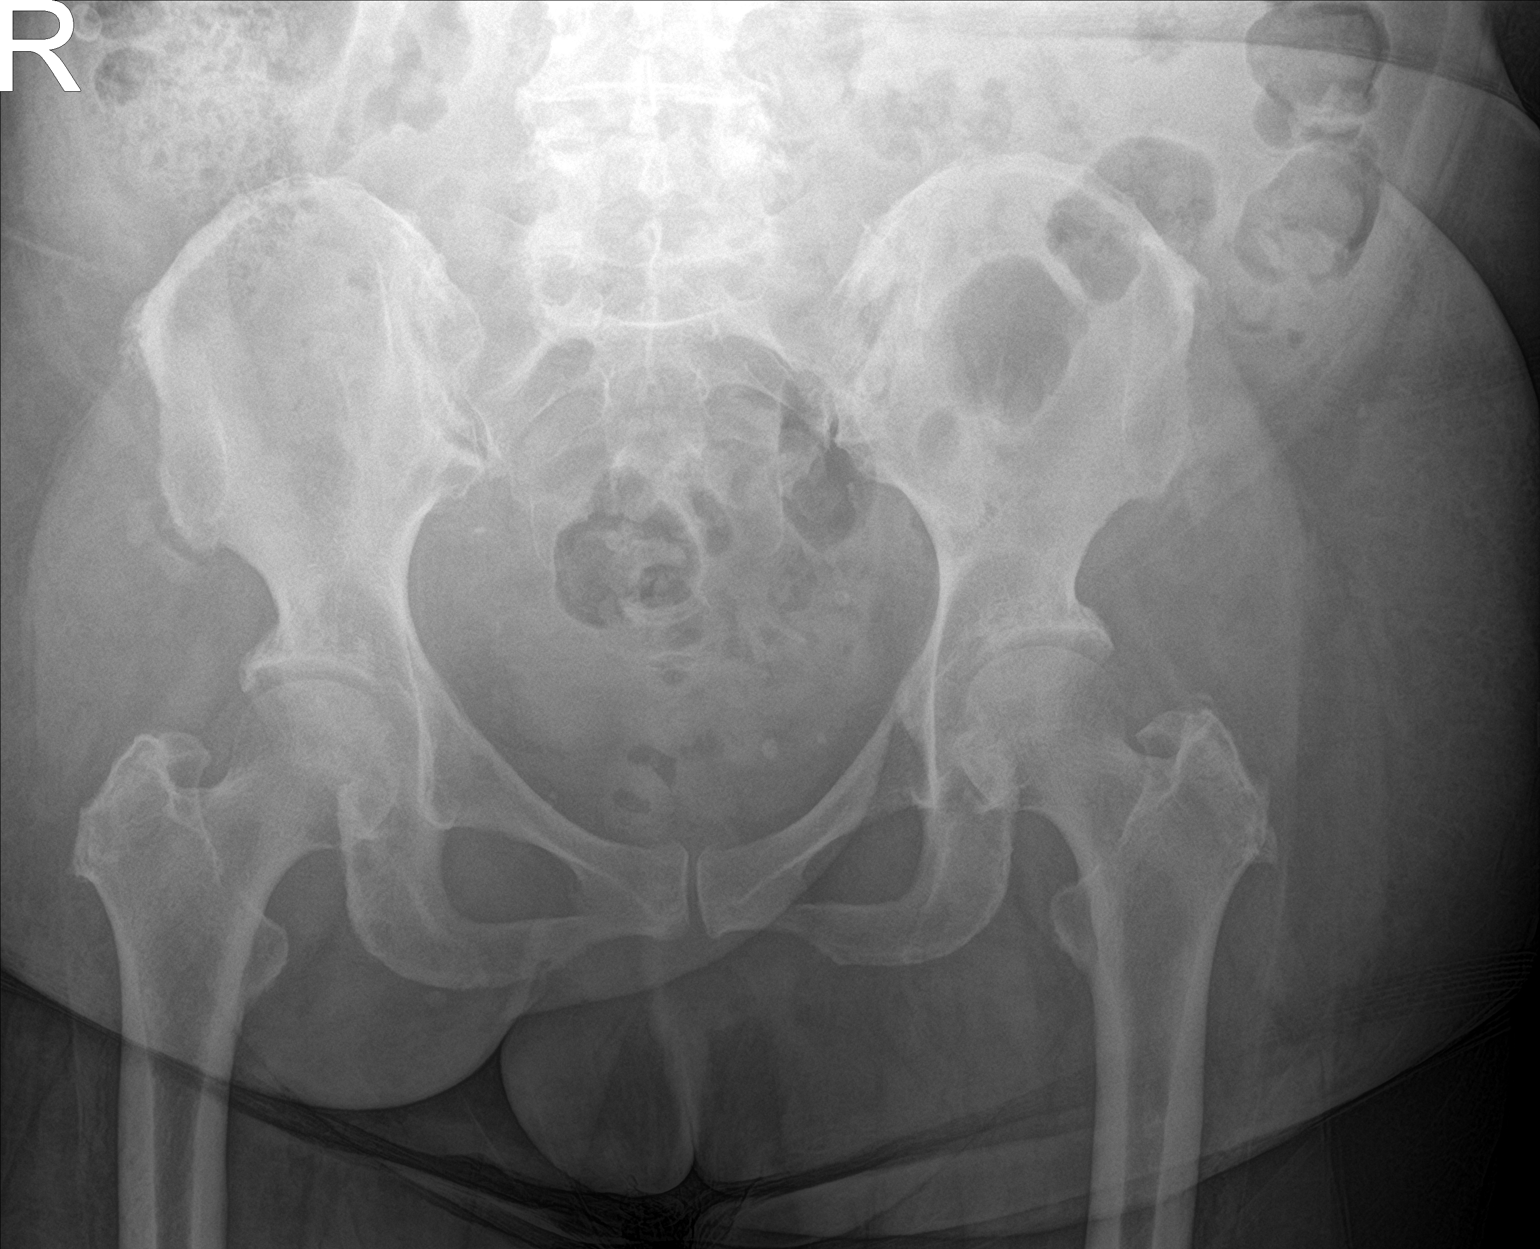

[3 of 3 positions shown; findings below may reference images not displayed]

FINDINGS: There is no evidence of hip fracture or dislocation. Mild
osteoarthrosis of both hips.
IMPRESSION: No fracture or dislocation of the right hip. Mild bilateral hip
osteoarthrosis.

## 2019-05-02 MED ORDER — POTASSIUM CHLORIDE ER 10 MEQ PO TBCR: 20.0000 meq | EXTENDED_RELEASE_TABLET | Freq: Every day | ORAL | 3 refills | Status: DC

## 2019-05-02 NOTE — Telephone Encounter (Signed)
Patient was wondering about her Mg level and wanted to know what Pharm D thought.  Per Marcelle Overlie Pharm D,  I would asked patient to take 20 MEQ of KCl (since she is low) and we can get a BMP and Mg. Made patient an appointment at the end of the month for lab work before seeing pharmacy in January.

## 2019-05-11 ENCOUNTER — Telehealth: Payer: Self-pay

## 2019-05-11 NOTE — Telephone Encounter (Signed)
lmomed the pt to move labs and pharmd visit on same day.. will await callback

## 2019-05-12 ENCOUNTER — Telehealth: Payer: Self-pay | Admitting: Family Medicine

## 2019-05-12 NOTE — Telephone Encounter (Signed)
Copied from [place] [id number]. Topic: General - Other >> May 12, 2019 10:11 AM Keene Breath wrote: Reason for CRM: Patient called to ask the nurse to call to ask if she needs to get the pneumonia shot.  She last had it about 2 yrs ago.  Patient would like to discuss at [phone]

## 2019-05-12 NOTE — Telephone Encounter (Signed)
Message Routed to PCP CMA 

## 2019-05-16 NOTE — Telephone Encounter (Signed)
Spoke with pt states that she will call to schedule a CPE and she can get advise on if Dr Volanda Napoleon recommends a PNA booster.

## 2019-05-23 ENCOUNTER — Other Ambulatory Visit: Payer: Medicare Other | Admitting: *Deleted

## 2019-05-23 ENCOUNTER — Other Ambulatory Visit: Payer: Self-pay

## 2019-05-23 DIAGNOSIS — I5020 Unspecified systolic (congestive) heart failure: Secondary | ICD-10-CM

## 2019-05-23 LAB — BASIC METABOLIC PANEL
BUN/Creatinine Ratio: 18 (ref 12–28)
BUN: 15 mg/dL (ref 8–27)
CO2: 28 mmol/L (ref 20–29)
Calcium: 10 mg/dL (ref 8.7–10.3)
Chloride: 99 mmol/L (ref 96–106)
Creatinine, Ser: 0.82 mg/dL (ref 0.57–1.00)
GFR calc Af Amer: 84 mL/min/{1.73_m2} (ref >59)
GFR calc non Af Amer: 73 mL/min/{1.73_m2} (ref >59)
Glucose: 133 mg/dL — ABNORMAL HIGH (ref 65–99)
Potassium: 3.8 mmol/L (ref 3.5–5.2)
Sodium: 142 mmol/L (ref 134–144)

## 2019-05-23 LAB — MAGNESIUM: Magnesium: 1.5 mg/dL — ABNORMAL LOW (ref 1.6–2.3)

## 2019-06-02 ENCOUNTER — Other Ambulatory Visit: Payer: Self-pay | Admitting: General Surgery

## 2019-06-02 DIAGNOSIS — K219 Gastro-esophageal reflux disease without esophagitis: Secondary | ICD-10-CM

## 2019-06-02 MED ORDER — OMEPRAZOLE 40 MG PO CPDR: 40.0000 mg | DELAYED_RELEASE_CAPSULE | Freq: Every day | ORAL | 6 refills | Status: DC

## 2019-06-02 NOTE — Progress Notes (Signed)
Fax received from [facility] for refill of omeprazole. Prescription sent to pharmacy.

## 2019-06-08 NOTE — Progress Notes (Signed)
Patient ID: Beth Macias                 DOB: [birth year]                      MRN: [id number]     HPI: Beth Macias is a 69 y.o. patient of Dr. Kyla Balzarine referred by Richardson Dopp, PA to [facility] clinic. PMH is significant for chronic systolic CHF, hypertension, hyperlipidemia, diabetes mellitus, morbid obesity, and asthma. Patient last echo showed an EF of 30-35%. She was initially reluctant to start medications. She eventually agreed to start Entresto 26-26 mg twice a day a carvedilol 3.125mg  twice a day on 11/24/2018. She did not want to take lasix daily, but agreed to take prn. She was seen in the ED on 12/05/2018 for shortness of breath and was started on furosemide 40mg  daily. BNP at the hospital was 2,464.   Patient's [name] was eventually titrated to maximum dose and spironolactone was added. However patient began experiencing hives on her abdomen. She has had issues with hives since she was younger. Magox was held then [facility] was reduced but this did not help. She was eventually saw an allergist who started her on a steroid cream and some antihistamines.  Patient has been counseled several times about the importance of taking her diuretic. She refuses to take if she is going to be out of the house, but has agreed to monitor weight daily (dry weight 221). If she skips or take 1/2 dose the days she goes out and she gains more than 3 pounds the next day, she is to take an extra 1/2 tablet that day.  She states she sleeps with 6-8 pillows. Attributes diffulty sleeping to her GERD and post nasal drip. She reports ability to do activities of daily living w/o getting short of breath.  At last visit with PharmD her blood pressure was elevated so carvedilol was increased to 6.25 mg twice a day and Entresto 49/51mg  twice a day and torsemide 20mg  daily were continued. Patient has refused to increase Entresto in the past and concerned about cost.  Patient blood pressure at  visit with Dr. Johnsie Cancel on 04/29/19 was 140/90. No medication changes were made. Patient was concerned about DVT. Lower Extremity Venous Study showed no DVT's in either legs (04/29/19). BMET normal except elevated glucose (133) and Mg 1.5.    Patient arrives at clinic today for CHF medication titration. The patient was in good spirits and interested in getting the COVID vaccine and her second pneumonia vaccine. Wt today 217lb. She reports eating a low sodium diet and started minor exercise activity daily.  She also asked about food options rich in magnesium. She also states drinking 64 oz of water daily. BP in clinic was 164/90 and HR 75.  Current HTN meds: Entresto 49/51mg  twice daily, carvedilol 6.25mg  twice daily, toresmide 20mg  once daily, potassium 20MEQ once daily Previously tried: Lasix (?hives), spironolactone (?hives) BP goal: <130/80 mmHG  Family History: The patient'sfamily history includes AAA (abdominal aortic aneurysm) in her father; Colon cancer in her maternal uncle; Crohn's disease in her paternal aunt; Diabetes in her mother; Gout in her brother and mother; Heart attack in her brother; Heart failure in her father and mother; Hypertension in her brother, father, and mother.  Social History: former smoker, neg ETOH  Diet: On a meal plan to lose weight- counts her Na intake- has been referred to medical weight management. Does not cook with  sodium. Eats broccoli and brussels sprouts.   Exercise: Mild activity sitting/standing in place for 15-20 min daily; gets short of breath with intense activity  Home BP readings: None  Wt Readings from Last 3 Encounters:  04/29/19 217 lb (98.4 kg)  03/07/19 228 lb 11.2 oz (103.7 kg)  02/24/19 227 lb (103 kg)   BP Readings from Last 3 Encounters:  04/29/19 140/90  03/07/19 124/76  02/24/19 (!) 148/80   Pulse Readings from Last 3 Encounters:  04/29/19 97  03/07/19 90  02/24/19 93    Renal function: CrCl cannot be calculated (Unknown  ideal weight.).  Past Medical History:  Diagnosis Date  . Asthma   . Blood in stool   . Colon polyps   . Diabetes mellitus without complication ([place])   . Diverticulitis   . Fibromyalgia   . GERD (gastroesophageal reflux disease)   . Gout   . Hypercholesteremia   . Hypertension   . Pneumonia   . Pneumonia   . UTI (urinary tract infection)     Current Outpatient Medications on File Prior to Visit  Medication Sig Dispense Refill  . budesonide-formoterol (SYMBICORT) 80-4.5 MCG/ACT inhaler Inhale 2 puffs into the lungs 2 (two) times daily. 1 Inhaler 11  . carvedilol (COREG) 6.25 MG tablet Take 1 tablet (6.25 mg total) by mouth 2 (two) times daily. 180 tablet 3  . cetirizine (ZYRTEC) 10 MG tablet Take 10 mg by mouth daily.    . Cholecalciferol (VITAMIN D3) 125 MCG (5000 UT) CAPS Take 1,000 Units by mouth at bedtime.     . fluticasone (FLONASE) 50 MCG/ACT nasal spray Place 1 spray into both nostrils daily. 16 g 5  . glucose blood (ONETOUCH ULTRA) test strip Use to check blood sugar 4-5 times daily 50 each 5  . ipratropium-albuterol (DUONEB) 0.5-2.5 (3) MG/3ML SOLN Take 3 mLs by nebulization every 6 (six) hours as needed.    Marland Kitchen levocetirizine (XYZAL) 5 MG tablet Take 5 mg by mouth every evening.    . magnesium 30 MG tablet Take 400 mg by mouth daily.    . mometasone (ELOCON) 0.1 % cream Apply 1 application topically daily.    . montelukast (SINGULAIR) 10 MG tablet Take 1 tablet (10 mg total) by mouth daily. 30 tablet 3  . omeprazole (PRILOSEC) 40 MG capsule Take 1 capsule (40 mg total) by mouth daily. 30 capsule 6  . potassium chloride (KLOR-CON) 10 MEQ tablet Take 2 tablets (20 mEq total) by mouth daily. 180 tablet 3  . PROAIR HFA 108 (90 Base) MCG/ACT inhaler Inhale 2 puffs into the lungs every 4 (four) hours as needed for wheezing.   9  . sacubitril-valsartan (ENTRESTO) 49-51 MG Take 1 tablet by mouth 2 (two) times daily. 60 tablet 11  . torsemide (DEMADEX) 20 MG tablet Take 1 tablet  (20 mg total) by mouth daily. (Patient taking differently: Take 20 mg by mouth daily. Patient takes 1/2-1 tablet) 90 tablet 3  . vitamin C (ASCORBIC ACID) 500 MG tablet Take 500 mg by mouth daily.     No current facility-administered medications on file prior to visit.    Allergies  Allergen Reactions  . Strawberry (Diagnostic) Anaphylaxis and Other (See Comments)    hives  . Penicillins Itching    Has patient had a PCN reaction causing immediate rash, facial/tongue/throat swelling, SOB or lightheadedness with hypotension: No Has patient had a PCN reaction causing severe rash involving mucus membranes or skin necrosis: No Has patient had a PCN  reaction that required hospitalization: Unknown Has patient had a PCN reaction occurring within the last 10 years: No If all of the above answers are "NO", then may proceed with Cephalosporin use.  . Tape Other (See Comments)    Blisters  . Gabapentin Itching, Rash and Other (See Comments)    Incoherent     Assessment/Plan:  1. CHF medication titration - Blood pressure above goal of <130/80. Increased carvedilol to 12.5 twice daily. The patient was very adamant that she does not want to increase dose of Entresto due to previous side effects she experienced. We explained that the side effects were more likely due to carvedilol, however the patient still refused. Encouraged the patient to check her blood pressure daily two hours after taking medications in the morning and continue to check weight daily. She is aware she can get her second pneumonia vaccine and her category for getting the COVID vaccine currently is phase 2 group 1. She should get a message from [place] when it is available to her with instructions on how to schedule an appointment. Will get BMET and Mg today per patient request and follow-up in clinic in 4 weeks. At next visit if blood pressure is still elevated can consider further titration of carvedilol (if heart rate allows) or  consider adding back spironolactone.   Julieta Bellini, PharmD  Ramond Dial, Pharm.D, BCPS, CPP [facility]  [id number], [place], [place] [address]  Phone: [phone]; Fax: [phone]

## 2019-06-09 ENCOUNTER — Ambulatory Visit (INDEPENDENT_AMBULATORY_CARE_PROVIDER_SITE_OTHER): Payer: Medicare Other | Admitting: Pharmacist

## 2019-06-09 ENCOUNTER — Other Ambulatory Visit: Payer: Self-pay

## 2019-06-09 VITALS — BP 164/90 | HR 75 | Wt 217.0 lb

## 2019-06-09 DIAGNOSIS — I10 Essential (primary) hypertension: Secondary | ICD-10-CM | POA: Diagnosis not present

## 2019-06-09 DIAGNOSIS — I5020 Unspecified systolic (congestive) heart failure: Secondary | ICD-10-CM | POA: Diagnosis not present

## 2019-06-09 LAB — BASIC METABOLIC PANEL
BUN/Creatinine Ratio: 16 (ref 12–28)
BUN: 16 mg/dL (ref 8–27)
CO2: 34 mmol/L — ABNORMAL HIGH (ref 20–29)
Calcium: 10.3 mg/dL (ref 8.7–10.3)
Chloride: 107 mmol/L — ABNORMAL HIGH (ref 96–106)
Creatinine, Ser: 0.99 mg/dL (ref 0.57–1.00)
GFR calc Af Amer: 67 mL/min/{1.73_m2} (ref >59)
GFR calc non Af Amer: 58 mL/min/{1.73_m2} — ABNORMAL LOW (ref >59)
Glucose: 106 mg/dL — ABNORMAL HIGH (ref 65–99)
Potassium: 3.2 mmol/L — ABNORMAL LOW (ref 3.5–5.2)
Sodium: 135 mmol/L (ref 134–144)

## 2019-06-09 LAB — MAGNESIUM: Magnesium: 1.8 mg/dL (ref 1.6–2.3)

## 2019-06-09 MED ORDER — CARVEDILOL 12.5 MG PO TABS: 12.5000 mg | ORAL_TABLET | Freq: Two times a day (BID) | ORAL | 3 refills | Status: DC

## 2019-06-09 NOTE — Patient Instructions (Addendum)
It was nice to see you again today!  Please increase your carvedilol to 12.5mg  twice a day. You may take 2 of the 6.25mg  tablets to equal 12.5mg  twice a day until you run out.  Please continue Entresto 49/51mg  twice daily, toresmide 20mg  once daily and potassium 20MEQ once daily. Try to increase your Magnesium rich foods  Call us at [phone] with any questions or concerns.

## 2019-06-13 ENCOUNTER — Other Ambulatory Visit: Payer: Self-pay

## 2019-06-13 ENCOUNTER — Telehealth: Payer: Self-pay | Admitting: Pharmacist

## 2019-06-13 DIAGNOSIS — E876 Hypokalemia: Secondary | ICD-10-CM

## 2019-06-13 MED ORDER — POTASSIUM CHLORIDE 20 MEQ/15ML (10%) PO SOLN: 20.0000 meq | Freq: Two times a day (BID) | ORAL | 11 refills | Status: DC

## 2019-06-13 NOTE — Telephone Encounter (Signed)
Patient returned Pam's phone call. Let her know that KCL liquid was sent to [facility]. Patient insists that she wants to try something other than the tablets even when I explained that it is normal to have shell of tablet in stool. She is out of town this week. Advised to [facility] a [facility] and let them know [facility] and they can transfer.

## 2019-06-13 NOTE — Telephone Encounter (Signed)
Called patient back. Made an appointment to get lab work done. Patient will come in on 06/23/19 for lab work.

## 2019-06-23 ENCOUNTER — Other Ambulatory Visit: Payer: Self-pay

## 2019-06-23 ENCOUNTER — Other Ambulatory Visit: Payer: Medicare Other | Admitting: *Deleted

## 2019-06-23 DIAGNOSIS — E876 Hypokalemia: Secondary | ICD-10-CM

## 2019-06-24 LAB — BASIC METABOLIC PANEL
BUN/Creatinine Ratio: 13 (ref 12–28)
BUN: 11 mg/dL (ref 8–27)
CO2: 27 mmol/L (ref 20–29)
Calcium: 10.2 mg/dL (ref 8.7–10.3)
Chloride: 103 mmol/L (ref 96–106)
Creatinine, Ser: 0.83 mg/dL (ref 0.57–1.00)
GFR calc Af Amer: 83 mL/min/{1.73_m2} (ref >59)
GFR calc non Af Amer: 72 mL/min/{1.73_m2} (ref >59)
Glucose: 107 mg/dL — ABNORMAL HIGH (ref 65–99)
Potassium: 4.4 mmol/L (ref 3.5–5.2)
Sodium: 143 mmol/L (ref 134–144)

## 2019-06-29 ENCOUNTER — Encounter: Payer: Self-pay | Admitting: Family Medicine

## 2019-06-29 ENCOUNTER — Other Ambulatory Visit: Payer: Self-pay

## 2019-06-29 ENCOUNTER — Ambulatory Visit (INDEPENDENT_AMBULATORY_CARE_PROVIDER_SITE_OTHER): Payer: Medicare Other | Admitting: Family Medicine

## 2019-06-29 VITALS — BP 138/78 | HR 66 | Temp 97.6°F | Wt 220.0 lb

## 2019-06-29 DIAGNOSIS — K219 Gastro-esophageal reflux disease without esophagitis: Secondary | ICD-10-CM | POA: Diagnosis not present

## 2019-06-29 DIAGNOSIS — Z23 Encounter for immunization: Secondary | ICD-10-CM | POA: Diagnosis not present

## 2019-06-29 DIAGNOSIS — E876 Hypokalemia: Secondary | ICD-10-CM | POA: Diagnosis not present

## 2019-06-29 DIAGNOSIS — I10 Essential (primary) hypertension: Secondary | ICD-10-CM

## 2019-06-29 DIAGNOSIS — E119 Type 2 diabetes mellitus without complications: Secondary | ICD-10-CM

## 2019-06-29 DIAGNOSIS — I5020 Unspecified systolic (congestive) heart failure: Secondary | ICD-10-CM

## 2019-06-29 DIAGNOSIS — Z0000 Encounter for general adult medical examination without abnormal findings: Secondary | ICD-10-CM

## 2019-06-29 LAB — BASIC METABOLIC PANEL
BUN: 17 mg/dL (ref 6–23)
CO2: 30 mEq/L (ref 19–32)
Calcium: 10.4 mg/dL (ref 8.4–10.5)
Chloride: 104 mEq/L (ref 96–112)
Creatinine, Ser: 0.75 mg/dL (ref 0.40–1.20)
GFR: 92.54 mL/min (ref >60.00)
Glucose, Bld: 118 mg/dL — ABNORMAL HIGH (ref 70–99)
Potassium: 4.2 mEq/L (ref 3.5–5.1)
Sodium: 142 mEq/L (ref 135–145)

## 2019-06-29 LAB — LIPID PANEL
Cholesterol: 237 mg/dL — ABNORMAL HIGH (ref 0–200)
HDL: 64.1 mg/dL (ref >39.00)
LDL Cholesterol: 143 mg/dL — ABNORMAL HIGH (ref 0–99)
NonHDL: 173.32
Total CHOL/HDL Ratio: 4
Triglycerides: 152 mg/dL — ABNORMAL HIGH (ref 0.0–149.0)
VLDL: 30.4 mg/dL (ref 0.0–40.0)

## 2019-06-29 LAB — CBC
HCT: 43.1 % (ref 36.0–46.0)
Hemoglobin: 14.2 g/dL (ref 12.0–15.0)
MCHC: 33.1 g/dL (ref 30.0–36.0)
MCV: 86.3 fl (ref 78.0–100.0)
Platelets: 256 10*3/uL (ref 150.0–400.0)
RBC: 4.99 Mil/uL (ref 3.87–5.11)
RDW: 14.6 % (ref 11.5–15.5)
WBC: 5.9 10*3/uL (ref 4.0–10.5)

## 2019-06-29 LAB — MAGNESIUM: Magnesium: 1.8 mg/dL (ref 1.5–2.5)

## 2019-06-29 LAB — VITAMIN B12: Vitamin B-12: 214 pg/mL (ref 211–911)

## 2019-06-29 LAB — HEMOGLOBIN A1C: Hgb A1c MFr Bld: 7.2 % — ABNORMAL HIGH (ref 4.6–6.5)

## 2019-06-29 NOTE — Progress Notes (Signed)
Subjective:     Beth Macias is a 69 y.o. female and is here for a comprehensive physical exam. The patient reports problems - hypokalemia, questions about covid vaccine.  Pt notes difficulty keeping potassium up with pills, but liquid does better.  Mg2+ level has been normal.  Pt recently seen by [facility] 2nd opinion on CHF.  Per pt they agree with current management.  BP stable.  Blood sugar has been stable.  Pt inquires about process to receive a COVID-19 vaccine.  Pt's daughter, a pediatrician in [place], is helping her sign up for the vaccine.  Also inquires about PCV-23 vaccine.  Pt had PCV-13 in 2016 at [facility].  Social History   Socioeconomic History  . Marital status: Legally Separated    Spouse name: Not on file  . Number of children: 3  . Years of education: Not on file  . Highest education level: Not on file  Occupational History  . Occupation: Pharmacist, hospital  Tobacco Use  . Smoking status: Former Smoker    Packs/day: 0.25    Years: 9.00    Pack years: 2.25    Types: Cigarettes    Quit date: 05/26/1976    Years since quitting: 43.1  . Smokeless tobacco: Never Used  Substance and Sexual Activity  . Alcohol use: Never  . Drug use: Never  . Sexual activity: Yes  Other Topics Concern  . Not on file  Social History Narrative  . Not on file   Social Determinants of Health   Financial Resource Strain:   . Difficulty of Paying Living Expenses: Not on file  Food Insecurity:   . Worried About Charity fundraiser in the Last Year: Not on file  . Ran Out of Food in the Last Year: Not on file  Transportation Needs:   . Lack of Transportation (Medical): Not on file  . Lack of Transportation (Non-Medical): Not on file  Physical Activity:   . Days of Exercise per Week: Not on file  . Minutes of Exercise per Session: Not on file  Stress:   . Feeling of Stress : Not on file  Social Connections:   . Frequency of Communication with Friends and  Family: Not on file  . Frequency of Social Gatherings with Friends and Family: Not on file  . Attends Religious Services: Not on file  . Active Member of Clubs or Organizations: Not on file  . Attends Archivist Meetings: Not on file  . Marital Status: Not on file  Intimate Partner Violence:   . Fear of Current or Ex-Partner: Not on file  . Emotionally Abused: Not on file  . Physically Abused: Not on file  . Sexually Abused: Not on file   Health Maintenance  Topic Date Due  . HEMOGLOBIN A1C  Nov 09, 1950  . Hepatitis C Screening  [birth year]  . FOOT EXAM  11/15/1959  . URINE MICROALBUMIN  11/15/1959  . DEXA SCAN  11/15/2014  . PNA vac Low Risk Adult (2 of 2 - PPSV23) 08/12/2018  . OPHTHALMOLOGY EXAM  03/26/2019  . MAMMOGRAM  02/16/2021  . TETANUS/TDAP  05/12/2024  . COLONOSCOPY  04/29/2028  . INFLUENZA VACCINE  Completed    The following portions of the patient's history were reviewed and updated as appropriate: allergies, current medications, past family history, past medical history, past social history, past surgical history and problem list.  Review of Systems Pertinent items noted in HPI and remainder of comprehensive ROS otherwise negative.  Objective:    BP 138/78 (BP Location: Left Arm, Patient Position: Sitting, Cuff Size: Large)   Pulse 66   Temp 97.6 F (36.4 C) (Temporal)   Wt 220 lb (99.8 kg)   SpO2 97%   BMI 42.97 kg/m  General appearance: alert, cooperative and no distress Head: Normocephalic, without obvious abnormality, atraumatic Eyes: conjunctivae/corneas clear. PERRL, EOM's intact. Fundi benign. Ears: normal TM's and external ear canals both ears Nose: Nares normal. Septum midline. Mucosa normal. No drainage or sinus tenderness. Throat: lips, mucosa, and tongue normal; teeth and gums normal Neck: no adenopathy, no carotid bruit, no JVD, supple, symmetrical, trachea midline and thyroid not enlarged, symmetric, no  tenderness/mass/nodules Lungs: clear to auscultation bilaterally Heart: regular rate and rhythm, S1, S2 normal, no murmur, click, rub or gallop Abdomen: soft, non-tender; bowel sounds normal; no masses,  no organomegaly Extremities: extremities normal, atraumatic, no cyanosis or edema Pulses: 2+ and symmetric Skin: Skin color, texture, turgor normal. No rashes or lesions Lymph nodes: Cervical, supraclavicular, and axillary nodes normal. Neurologic: Alert and oriented X 3, normal strength and tone. Normal symmetric reflexes. Normal coordination and gait    Assessment:    Healthy female exam.      Plan:     Anticipatory guidance given including wearing seatbelts, smoke detectors in the home, increasing physical activity, increasing p.o. intake of water and vegetables. -will obtain labs -mammogram done 02/17/19 -pap due. -given handout -next CPE in 1 yr See After Visit Summary for Counseling Recommendations    Need for vaccination against Streptococcus pneumoniae  - Plan: Pneumococcal polysaccharide vaccine 23-valent greater than or equal to 2yo subcutaneous/IM  Essential hypertension  -discussed lifestyle modifications -continue current meds - Plan: CBC (no diff), Basic metabolic panel  Systolic congestive heart failure, unspecified HF chronicity (HCC) -stable -continue entresto 49-51 mg BID, coreg 12.5 mg BID, toresmide 20 mg, and Potassium chloride soln 20 mEq BID  Hypokalemia  -continue Potassium chloride soln 20 mEq/15 mL - Plan: Basic metabolic panel, Magnesium  Type 2 diabetes mellitus without complication, without long-term current use of insulin (HCC)  -diet controlled -continue lifestyle modifications - Plan: Lipid panel, Hemoglobin A1c, Vitamin B12  Gastroesophageal reflux disease, unspecified whether esophagitis present -continue omeprazole 40 mg - Plan: Vitamin B12  F/u prn   Grier Mitts, MD

## 2019-06-29 NOTE — Patient Instructions (Addendum)
Try looking up [email or web address] for chair exercises.   Preventive Care 70 Years and Older, Female Preventive care refers to lifestyle choices and visits with your health care provider that can promote health and wellness. This includes:  A yearly physical exam. This is also called an annual well check.  Regular dental and eye exams.  Immunizations.  Screening for certain conditions.  Healthy lifestyle choices, such as diet and exercise. What can I expect for my preventive care visit? Physical exam Your health care provider will check:  Height and weight. These may be used to calculate body mass index (BMI), which is a measurement that tells if you are at a healthy weight.  Heart rate and blood pressure.  Your skin for abnormal spots. Counseling Your health care provider may ask you questions about:  Alcohol, tobacco, and drug use.  Emotional well-being.  Home and relationship well-being.  Sexual activity.  Eating habits.  History of falls.  Memory and ability to understand (cognition).  Work and work Statistician.  Pregnancy and menstrual history. What immunizations do I need?  Influenza (flu) vaccine  This is recommended every year. Tetanus, diphtheria, and pertussis (Tdap) vaccine  You may need a Td booster every 10 years. Varicella (chickenpox) vaccine  You may need this vaccine if you have not already been vaccinated. Zoster (shingles) vaccine  You may need this after age 20. Pneumococcal conjugate (PCV13) vaccine  One dose is recommended after age 49. Pneumococcal polysaccharide (PPSV23) vaccine  One dose is recommended after age 24. Measles, mumps, and rubella (MMR) vaccine  You may need at least one dose of MMR if you were born in 1957 or later. You may also need a second dose. Meningococcal conjugate (MenACWY) vaccine  You may need this if you have certain conditions. Hepatitis A vaccine  You may need this if you have certain  conditions or if you travel or work in places where you may be exposed to hepatitis A. Hepatitis B vaccine  You may need this if you have certain conditions or if you travel or work in places where you may be exposed to hepatitis B. Haemophilus influenzae type b (Hib) vaccine  You may need this if you have certain conditions. You may receive vaccines as individual doses or as more than one vaccine together in one shot (combination vaccines). Talk with your health care provider about the risks and benefits of combination vaccines. What tests do I need? Blood tests  Lipid and cholesterol levels. These may be checked every 5 years, or more frequently depending on your overall health.  Hepatitis C test.  Hepatitis B test. Screening  Lung cancer screening. You may have this screening every year starting at age 100 if you have a 30-pack-year history of smoking and currently smoke or have quit within the past 15 years.  Colorectal cancer screening. All adults should have this screening starting at age 3 and continuing until age 73. Your health care provider may recommend screening at age 48 if you are at increased risk. You will have tests every 1-10 years, depending on your results and the type of screening test.  Diabetes screening. This is done by checking your blood sugar (glucose) after you have not eaten for a while (fasting). You may have this done every 1-3 years.  Mammogram. This may be done every 1-2 years. Talk with your health care provider about how often you should have regular mammograms.  BRCA-related cancer screening. This may be done if you have a  family history of breast, ovarian, tubal, or peritoneal cancers. Other tests  Sexually transmitted disease (STD) testing.  Bone density scan. This is done to screen for osteoporosis. You may have this done starting at age 42. Follow these instructions at home: Eating and drinking  Eat a diet that includes fresh fruits and  vegetables, whole grains, lean protein, and low-fat dairy products. Limit your intake of foods with high amounts of sugar, saturated fats, and salt.  Take vitamin and mineral supplements as recommended by your health care provider.  Do not drink alcohol if your health care provider tells you not to drink.  If you drink alcohol: ? Limit how much you have to 0-1 drink a day. ? Be aware of how much alcohol is in your drink. In the [place] one drink equals one 12 oz bottle of beer (355 mL), one 5 oz glass of wine (148 mL), or one 1 oz glass of hard liquor (44 mL). Lifestyle  Take daily care of your teeth and gums.  Stay active. Exercise for at least 30 minutes on 5 or more days each week.  Do not use any products that contain nicotine or tobacco, such as cigarettes, e-cigarettes, and chewing tobacco. If you need help quitting, ask your health care provider.  If you are sexually active, practice safe sex. Use a condom or other form of protection in order to prevent STIs (sexually transmitted infections).  Talk with your health care provider about taking a low-dose aspirin or statin. What's next?  Go to your health care provider once a year for a well check visit.  Ask your health care provider how often you should have your eyes and teeth checked.  Stay up to date on all vaccines. This information is not intended to replace advice given to you by your health care provider. Make sure you discuss any questions you have with your health care provider. Document Revised: 05/06/2018 Document Reviewed: 05/06/2018 Elsevier Patient Education  2020 [place] With Heart Failure  Heart failure is a long-term (chronic) condition in which the heart cannot pump enough blood through the body. When this happens, parts of the body do not get the blood and oxygen they need. There is no cure for heart failure at this time, so it is important for you to take good care of yourself and follow the  treatment plan set by your health care provider. If you are living with heart failure, there are ways to help you manage the disease. Follow these instructions at home: Living with heart failure requires you to make changes in your life. Your health care team will teach you about the changes you need to make in order to relieve your symptoms and lower your risk of going to the hospital. Follow the treatment plan as set by your health care provider. Medicines Medicines are important in reducing your heart's workload, slowing the progression of heart failure, and improving your symptoms.  Take over-the-counter and prescription medicines only as told by your health care provider.  Do not stop taking your medicine unless your health care provider tells you to do that.  Do not skip any dose of your medicine.  Refill prescriptions before you run out of medicine. You need your medicines every day. Eating and drinking   Eat heart-healthy foods. Talk with a dietitian to make an eating plan that is right for you. ? If directed by your health care provider:  Limit salt (sodium). Lowering your sodium intake  may reduce symptoms of heart failure. Ask a dietitian to recommend heart-healthy seasonings.  Limit your fluid intake. Fluid restriction may reduce symptoms of heart failure. ? Use low-fat cooking methods instead of frying. Low-fat methods include roasting, grilling, broiling, baking, poaching, steaming, and stir-frying. ? Choose foods that contain no trans fat and are low in saturated fat and cholesterol. Healthy choices include fresh or frozen fruits and vegetables, fish, lean meats, legumes, fat-free or low-fat dairy products, and whole-grain or high-fiber foods.  Limit alcohol intake to no more than 1 drink a day for nonpregnant women and 2 drinks a day for men. One drink equals 12 oz of beer, 5 oz of wine, or 1 oz of hard liquor. ? Drinking more than that is harmful to your heart. Tell your  health care provider if you drink alcohol several times a week. ? Talk with your health care provider about whether any level of alcohol use is safe for you. Activity   Ask your health care provider about attending cardiac rehabilitation. These programs include aerobic physical activity, which provides many benefits for your heart.  If no cardiac rehabilitation program is available, ask your health care provider what aerobic exercises are safe for you to do. Lifestyle Make the lifestyle changes recommended by your health care provider. In general:  Lose weight if your health care provider tells you to do that. Weight loss may reduce symptoms of heart failure.  Do not use any products that contain nicotine or tobacco, such as cigarettes or e-cigarettes. If you need help quitting, ask your health care provider.  Do not use street (illegal) drugs.  Return to your normal activities as told by your health care provider. Ask your health care provider what activities are safe for you. General instructions   Make sure you weigh yourself every day to track your weight. Rapid weight gain may indicate an increase in fluid in your body and may increase the workload of your heart. ? Weigh yourself every morning. Do this after you urinate but before you eat breakfast. ? Wear the same type of clothing, without shoes, each time you weigh yourself. ? Weigh yourself on the same scale and in the same spot each time.  Living with chronic heart failure often leads to emotions such as fear, stress, anxiety, and depression. If you feel any of these emotions and need help coping, contact your health care provider. Other ways to get help include: ? Talking to friends and family members about your condition. They can give you support and guidance. Explain your symptoms to them and, if comfortable, invite them to attend appointments or rehabilitation with you. ? Joining a support group for people with chronic heart  failure. Talking with other people who have the same symptoms may give you new ways of coping with your disease and your emotions.  Stay up to date with your shots (vaccines). Staying current on pneumococcal and influenza vaccines is especially important in preventing germs from attacking your airways (respiratory infections).  Keep all follow-up visits as told by your health care provider. This is important. How to recognize changes in your condition You and your family members need to know what changes to watch for in your condition. Watch for the following changes and report them to your health care provider:  Sudden weight gain. Ask your health care provider what amount of weight gain to report.  Shortness of breath: ? Feeling short of breath while at rest, with no exercise or activity  that required great effort. ? Feeling breathless with activity.  Swelling of your lower legs or ankles.  Difficulty sleeping: ? You wake up feeling short of breath. ? You have to use more pillows to raise your head in order to sleep.  Frequent, dry, hacking cough.  Loss of appetite.  Feeling more tired all the time.  Depression or feelings of sadness or hopelessness.  Bloating in the stomach. Where to find more information  Local support groups. Ask your health care provider about groups near you.  The American Heart Association: www.heart.org Contact a health care provider if:  You have a rapid weight gain.  You have increasing shortness of breath that is unusual for you.  You are unable to participate in your usual physical activities.  You tire easily.  You cough more than normal, especially with physical activity.  You have any swelling or more swelling in areas such as your hands, feet, ankles, or abdomen.  You feel like your heart is beating quickly (palpitations).  You become dizzy or light-headed when you stand up. Get help right away if:  You have difficulty  breathing.  You notice or your family notices a change in your awareness, such as having trouble staying awake or having difficulty with concentration.  You have pain or discomfort in your chest.  You have an episode of fainting (syncope). Summary  There is no cure for heart failure, so it is important for you to take good care of yourself and follow the treatment plan set by your health care provider.  Medicines are important in reducing your heart's workload, slowing the progression of heart failure, and improving your symptoms.  Living with chronic heart failure often leads to emotions such as fear, stress, anxiety, and depression. If you are feeling any of these emotions and need help coping, contact your health care provider. This information is not intended to replace advice given to you by your health care provider. Make sure you discuss any questions you have with your health care provider. Document Revised: 04/24/2017 Document Reviewed: 09/24/2016 Elsevier Patient Education  2020 [employer].  Hypokalemia Hypokalemia means that the amount of potassium in the blood is lower than normal. Potassium is a chemical (electrolyte) that helps regulate the amount of fluid in the body. It also stimulates muscle tightening (contraction) and helps nerves work properly. Normally, most of the body's potassium is inside cells, and only a very small amount is in the blood. Because the amount in the blood is so small, minor changes to potassium levels in the blood can be life-threatening. What are the causes? This condition may be caused by:  Antibiotic medicine.  Diarrhea or vomiting. Taking too much of a medicine that helps you have a bowel movement (laxative) can cause diarrhea and lead to hypokalemia.  Chronic kidney disease (CKD).  Medicines that help the body get rid of excess fluid (diuretics).  Eating disorders, such as bulimia.  Low magnesium levels in the body.  Sweating a  lot. What are the signs or symptoms? Symptoms of this condition include:  Weakness.  Constipation.  Fatigue.  Muscle cramps.  Mental confusion.  Skipped heartbeats or irregular heartbeat (palpitations).  Tingling or numbness. How is this diagnosed? This condition is diagnosed with a blood test. How is this treated? This condition may be treated by:  Taking potassium supplements by mouth.  Adjusting the medicines that you take.  Eating more foods that contain a lot of potassium. If your potassium level is  very low, you may need to get potassium through an IV and be monitored in the hospital. Follow these instructions at home:   Take over-the-counter and prescription medicines only as told by your health care provider. This includes vitamins and supplements.  Eat a healthy diet. A healthy diet includes fresh fruits and vegetables, whole grains, healthy fats, and lean proteins.  If instructed, eat more foods that contain a lot of potassium. This includes: ? Nuts, such as peanuts and pistachios. ? Seeds, such as sunflower seeds and pumpkin seeds. ? Peas, lentils, and lima beans. ? Whole grain and bran cereals and breads. ? Fresh fruits and vegetables, such as apricots, avocado, bananas, cantaloupe, kiwi, oranges, tomatoes, asparagus, and potatoes. ? Orange juice. ? Tomato juice. ? Red meats. ? Yogurt.  Keep all follow-up visits as told by your health care provider. This is important. Contact a health care provider if you:  Have weakness that gets worse.  Feel your heart pounding or racing.  Vomit.  Have diarrhea.  Have diabetes (diabetes mellitus) and you have trouble keeping your blood sugar (glucose) in your target range. Get help right away if you:  Have chest pain.  Have shortness of breath.  Have vomiting or diarrhea that lasts for more than 2 days.  Faint. Summary  Hypokalemia means that the amount of potassium in the blood is lower than  normal.  This condition is diagnosed with a blood test.  Hypokalemia may be treated by taking potassium supplements, adjusting the medicines that you take, or eating more foods that are high in potassium.  If your potassium level is very low, you may need to get potassium through an IV and be monitored in the hospital. This information is not intended to replace advice given to you by your health care provider. Make sure you discuss any questions you have with your health care provider. Document Revised: 12/23/2017 Document Reviewed: 12/23/2017 Elsevier Patient Education  [place].  Peripheral Edema  Peripheral edema is swelling that is caused by a buildup of fluid. Peripheral edema most often affects the lower legs, ankles, and feet. It can also develop in the arms, hands, and face. The area of the body that has peripheral edema will look swollen. It may also feel heavy or warm. Your clothes may start to feel tight. Pressing on the area may make a temporary dent in your skin. You may not be able to move your swollen arm or leg as much as usual. There are many causes of peripheral edema. It can happen because of a complication of other conditions such as congestive heart failure, kidney disease, or a problem with your blood circulation. It also can be a side effect of certain medicines or because of an infection. It often happens to women during pregnancy. Sometimes, the cause is not known. Follow these instructions at home: Managing pain, stiffness, and swelling   Raise (elevate) your legs while you are sitting or lying down.  Move around often to prevent stiffness and to lessen swelling.  Do not sit or stand for long periods of time.  Wear support stockings as told by your health care provider. Medicines  Take over-the-counter and prescription medicines only as told by your health care provider.  Your health care provider may prescribe medicine to help your body get rid of  excess water (diuretic). General instructions  Pay attention to any changes in your symptoms.  Follow instructions from your health care provider about limiting salt (sodium) in  your diet. Sometimes, eating less salt may reduce swelling.  Moisturize skin daily to help prevent skin from cracking and draining.  Keep all follow-up visits as told by your health care provider. This is important. Contact a health care provider if you have:  A fever.  Edema that starts suddenly or is getting worse, especially if you are pregnant or have a medical condition.  Swelling in only one leg.  Increased swelling, redness, or pain in one or both of your legs.  Drainage or sores at the area where you have edema. Get help right away if you:  Develop shortness of breath, especially when you are lying down.  Have pain in your chest or abdomen.  Feel weak.  Feel faint. Summary  Peripheral edema is swelling that is caused by a buildup of fluid. Peripheral edema most often affects the lower legs, ankles, and feet.  Move around often to prevent stiffness and to lessen swelling. Do not sit or stand for long periods of time.  Pay attention to any changes in your symptoms.  Contact a health care provider if you have edema that starts suddenly or is getting worse, especially if you are pregnant or have a medical condition.  Get help right away if you develop shortness of breath, especially when lying down. This information is not intended to replace advice given to you by your health care provider. Make sure you discuss any questions you have with your health care provider. Document Revised: 02/03/2018 Document Reviewed: 02/03/2018 Elsevier Patient Education  [place].

## 2019-06-30 ENCOUNTER — Ambulatory Visit: Payer: Medicare Other

## 2019-07-05 ENCOUNTER — Other Ambulatory Visit: Payer: Self-pay | Admitting: Pulmonary Disease

## 2019-07-05 ENCOUNTER — Other Ambulatory Visit: Payer: Self-pay

## 2019-07-05 MED ORDER — FLUTICASONE PROPIONATE 50 MCG/ACT NA SUSP: 1.0000 | Freq: Every day | NASAL | 1 refills | Status: DC

## 2019-07-05 NOTE — Telephone Encounter (Signed)
Received faxed refill request from [facility]  Medication name/strength/dose: Flonase nasal spray Medication last rx'd: 12/30/2018 Quantity and number of refills last rx'd: 16g with 5 refills  Patient last seen in the office on 8.21.2020 by Dr Halford Chessman, with recs to follow up in 2 months.  Appt was never scheduled.  Does refill need to be authorized by a provider? No. Refill authorized (yes or no)?: yes, with limited refills as patient is overdue for appt.

## 2019-07-05 NOTE — Telephone Encounter (Signed)
Refill request for Fluticasone  Last OV: 01/14/2019 Next OV: Nothing scheduled  Last ordered by : Dr. Estell Harpin on 07/05/19-Dr.Sood  Allergies  Allergen Reactions  . Strawberry (Diagnostic) Anaphylaxis and Other (See Comments)    hives  . Penicillins Itching    Has patient had a PCN reaction causing immediate rash, facial/tongue/throat swelling, SOB or lightheadedness with hypotension: No Has patient had a PCN reaction causing severe rash involving mucus membranes or skin necrosis: No Has patient had a PCN reaction that required hospitalization: Unknown Has patient had a PCN reaction occurring within the last 10 years: No If all of the above answers are "NO", then may proceed with Cephalosporin use.  . Tape Other (See Comments)    Blisters  . Gabapentin Itching, Rash and Other (See Comments)    Incoherent    Current Outpatient Medications on File Prior to Visit  Medication Sig Dispense Refill  . budesonide-formoterol (SYMBICORT) 80-4.5 MCG/ACT inhaler Inhale 2 puffs into the lungs 2 (two) times daily. 1 Inhaler 11  . carvedilol (COREG) 12.5 MG tablet Take 1 tablet (12.5 mg total) by mouth 2 (two) times daily. 180 tablet 3  . cetirizine (ZYRTEC) 10 MG tablet Take 10 mg by mouth daily.    . Cholecalciferol (VITAMIN D3) 125 MCG (5000 UT) CAPS Take 1,000 Units by mouth at bedtime.     . fluticasone (FLONASE) 50 MCG/ACT nasal spray Place 1 spray into both nostrils daily. Overdue for appt - needs to be seen 16 g 1  . glucose blood (ONETOUCH ULTRA) test strip Use to check blood sugar 4-5 times daily 50 each 5  . ipratropium-albuterol (DUONEB) 0.5-2.5 (3) MG/3ML SOLN Take 3 mLs by nebulization every 6 (six) hours as needed.    Marland Kitchen levocetirizine (XYZAL) 5 MG tablet Take 5 mg by mouth every evening.    . magnesium 30 MG tablet Take 400 mg by mouth daily.    . mometasone (ELOCON) 0.1 % cream Apply 1 application topically daily.    . montelukast (SINGULAIR) 10 MG tablet Take 1 tablet (10 mg  total) by mouth daily. 30 tablet 3  . omeprazole (PRILOSEC) 40 MG capsule Take 1 capsule (40 mg total) by mouth daily. 30 capsule 6  . potassium chloride 20 MEQ/15ML (10%) SOLN Take 15 mLs (20 mEq total) by mouth 2 (two) times daily. 946 mL 11  . PROAIR HFA 108 (90 Base) MCG/ACT inhaler Inhale 2 puffs into the lungs every 4 (four) hours as needed for wheezing.   9  . sacubitril-valsartan (ENTRESTO) 49-51 MG Take 1 tablet by mouth 2 (two) times daily. 60 tablet 11  . torsemide (DEMADEX) 20 MG tablet Take 1 tablet (20 mg total) by mouth daily. (Patient taking differently: Take 20 mg by mouth daily. Patient takes 1/2-1 tablet) 90 tablet 3  . vitamin C (ASCORBIC ACID) 500 MG tablet Take 500 mg by mouth daily.     No current facility-administered medications on file prior to visit.

## 2019-07-07 ENCOUNTER — Other Ambulatory Visit: Payer: Self-pay

## 2019-07-07 ENCOUNTER — Ambulatory Visit (INDEPENDENT_AMBULATORY_CARE_PROVIDER_SITE_OTHER): Payer: Medicare Other | Admitting: Pharmacist

## 2019-07-07 VITALS — BP 148/90 | HR 77 | Wt 218.0 lb

## 2019-07-07 DIAGNOSIS — I10 Essential (primary) hypertension: Secondary | ICD-10-CM | POA: Diagnosis not present

## 2019-07-07 DIAGNOSIS — E876 Hypokalemia: Secondary | ICD-10-CM | POA: Diagnosis not present

## 2019-07-07 DIAGNOSIS — I5020 Unspecified systolic (congestive) heart failure: Secondary | ICD-10-CM | POA: Diagnosis not present

## 2019-07-07 MED ORDER — CARVEDILOL 25 MG PO TABS: 25.0000 mg | ORAL_TABLET | Freq: Two times a day (BID) | ORAL | 3 refills | Status: DC

## 2019-07-07 NOTE — Patient Instructions (Addendum)
It was a pleasure to see you again!  Please increase your carvedilol to 25mg  twice a day. You may take 2 of the 12.5mg  until you run out. Continue Entresto 49/51mg  twice daily,  toresmide 20mg  once daily You may decrease potassium 10 20MEQ (70ml) once daily  Call us at [phone] with any questions or concerns

## 2019-07-07 NOTE — Progress Notes (Signed)
Patient ID: Beth Macias                 DOB: 24-Oct-1950                      MRN: [id number]     HPI: Beth Macias is a 69 y.o. patient of Dr. Kyla Balzarine referred by Richardson Dopp, PA to CVRR clinic. PMH is significant for chronic systolic CHF, hypertension, hyperlipidemia, diabetes mellitus, morbid obesity, and asthma. Patient last echo showed an EF of 30-35%. She was initially reluctant to start medications. She eventually agreed to start Entresto 26-26 mg twice a day a carvedilol 3.148m twice a day on 11/24/2018. She did not want to take lasix daily, but agreed to take prn. She was seen in the ED on 12/05/2018 for shortness of breath and was started on furosemide 460mdaily. BNP at the hospital was 2,464.   Patient's EnDelene Lollas eventually titrated to maximum dose and spironolactone was added. However patient began experiencing hives on her abdomen. She has had issues with hives since she was younger. [place] was held then [facility] reduced but this did not help. She was eventually saw an allergist who started her on a steroid cream and some antihistamines.  Patient has been counseled several times about the importance of taking her diuretic. She refuses to take if she is going to be out of the house, but has agreed to monitor weight daily (dry weight 221). If she skips or take 1/2 dose the days she goes out and she gains more than 3 pounds the next day, she is to take an extra 1/2 tablet that day.  She states she sleeps with 6-8 pillows. Attributes diffulty sleeping to her GERD and post nasal drip. She reports ability to do activities of daily living w/o getting short of breath.  At last visit with PharmD her blood pressure was elevated so carvedilol was increased to 12.5 mg twice a day and Entresto 49/5167mwice a day and torsemide 61m36mily were continued. Patient has refused to increase Entresto in the past.  Patient presents today to clinic in good spirits.  She states that she is tolerating the increased dose of carvedilol well. She says she fell of a little with her diet. She is using a crock pot and trying to eat foods high in K and Mg. She states that she is also drinking tumeric water which she thinks is helping a lot with inflammation and her pain. She is very happy with her K level since switching to the liquid potassium and wants to know if she can start taking just once a day. She asks about the COVID vaccine. I helped her get on the wait list for [facility].   We discussed results of A1C and lipid panel. Patient is very insistent on managing through diet.  Current HTN meds: Entresto 49/51mg81mce daily, carvedilol 12.5mg t24me daily, toresmide 61mg o76mdaily, potassium 20MEQ once twice a day Previously tried: Lasix (?hives), spironolactone (?hives) BP goal: <130/80 mmHG  Family History: The patient'sfamily history includes AAA (abdominal aortic aneurysm) in her father; Colon cancer in her maternal uncle; Crohn's disease in her paternal aunt; Diabetes in her mother; Gout in her brother and mother; Heart attack in her brother; Heart failure in her father and mother; Hypertension in her brother, father, and mother.  Social History: former smoker, neg ETOH  Diet: On a meal plan to lose weight- counts her Na intake-  has been referred to medical weight management. Does not cook with sodium. Eats broccoli and brussels sprouts.   Exercise: Mild activity sitting/standing in place for 15-20 min daily; gets short of breath with intense activity  Home BP readings: 130's/70's per pt report  Wt Readings from Last 3 Encounters:  06/29/19 220 lb (99.8 kg)  06/09/19 217 lb (98.4 kg)  04/29/19 217 lb (98.4 kg)   BP Readings from Last 3 Encounters:  06/29/19 138/78  06/09/19 (!) 164/90  04/29/19 140/90   Pulse Readings from Last 3 Encounters:  06/29/19 66  06/09/19 75  04/29/19 97    Renal function: Estimated Creatinine Clearance: 70.4 mL/min (by  C-G formula based on SCr of 0.75 mg/dL).  Past Medical History:  Diagnosis Date  . Asthma   . Blood in stool   . Colon polyps   . Diabetes mellitus without complication ([place])   . Diverticulitis   . Fibromyalgia   . GERD (gastroesophageal reflux disease)   . Gout   . Hypercholesteremia   . Hypertension   . Pneumonia   . Pneumonia   . UTI (urinary tract infection)     Current Outpatient Medications on File Prior to Visit  Medication Sig Dispense Refill  . budesonide-formoterol (SYMBICORT) 80-4.5 MCG/ACT inhaler Inhale 2 puffs into the lungs 2 (two) times daily. 1 Inhaler 11  . carvedilol (COREG) 12.5 MG tablet Take 1 tablet (12.5 mg total) by mouth 2 (two) times daily. 180 tablet 3  . cetirizine (ZYRTEC) 10 MG tablet Take 10 mg by mouth daily.    . Cholecalciferol (VITAMIN D3) 125 MCG (5000 UT) CAPS Take 1,000 Units by mouth at bedtime.     . fluticasone (FLONASE) 50 MCG/ACT nasal spray Place 1 spray into both nostrils daily. Overdue for appt - needs to be seen 16 g 1  . glucose blood (ONETOUCH ULTRA) test strip Use to check blood sugar 4-5 times daily 50 each 5  . ipratropium-albuterol (DUONEB) 0.5-2.5 (3) MG/3ML SOLN Take 3 mLs by nebulization every 6 (six) hours as needed.    Marland Kitchen levocetirizine (XYZAL) 5 MG tablet Take 5 mg by mouth every evening.    . magnesium 30 MG tablet Take 400 mg by mouth daily.    . mometasone (ELOCON) 0.1 % cream Apply 1 application topically daily.    . montelukast (SINGULAIR) 10 MG tablet Take 1 tablet (10 mg total) by mouth daily. 30 tablet 3  . omeprazole (PRILOSEC) 40 MG capsule Take 1 capsule (40 mg total) by mouth daily. 30 capsule 6  . potassium chloride 20 MEQ/15ML (10%) SOLN Take 15 mLs (20 mEq total) by mouth 2 (two) times daily. 946 mL 11  . PROAIR HFA 108 (90 Base) MCG/ACT inhaler Inhale 2 puffs into the lungs every 4 (four) hours as needed for wheezing.   9  . sacubitril-valsartan (ENTRESTO) 49-51 MG Take 1 tablet by mouth 2 (two) times daily.  60 tablet 11  . torsemide (DEMADEX) 20 MG tablet Take 1 tablet (20 mg total) by mouth daily. (Patient taking differently: Take 20 mg by mouth daily. Patient takes 1/2-1 tablet) 90 tablet 3  . vitamin C (ASCORBIC ACID) 500 MG tablet Take 500 mg by mouth daily.     No current facility-administered medications on file prior to visit.    Allergies  Allergen Reactions  . Strawberry (Diagnostic) Anaphylaxis and Other (See Comments)    hives  . Penicillins Itching    Has patient had a PCN reaction causing immediate  rash, facial/tongue/throat swelling, SOB or lightheadedness with hypotension: No Has patient had a PCN reaction causing severe rash involving mucus membranes or skin necrosis: No Has patient had a PCN reaction that required hospitalization: Unknown Has patient had a PCN reaction occurring within the last 10 years: No If all of the above answers are "NO", then may proceed with Cephalosporin use.  . Tape Other (See Comments)    Blisters  . Gabapentin Itching, Rash and Other (See Comments)    Incoherent     Assessment/Plan:  1. CHF medication titration - Blood pressure above goal of <130/80. Home blood pressure appear to be slightly better than clinic readings. Patient did not want to increase Entresto nor did she want to retry spironolactone. She is not a good candidate for a thyazide diuretic due to hx of gout. Bidil is an options, but beta blocker should be optimized first. She is agreeable to increasing carvedilol to 63m twice a day. I advised that she may decrease her KCL to once a day since cost of liquid is high and her K was above 4. She sees Dr. NJohnsie Cancelin a few weeks. This will serve as follow up. We will be happy to see her back in clinic afterwards if needed. I did call [name] and confirm they did d/c the other strengths of carvedilol as this has been an issue in the past.  2. Hyperlipidemia- LDL is above goal of <100. Patient has an ASCVD risk score of 32.6% and has DM.  She should, per ACC/AHA guidelines, be on a high intensity statin. I attempted to discuss this with patient and try to explain the cardiovascular protectant affects of statins but patient was insistent that she did not want any more medication and would control with her diet.  MRamond Dial Pharm.D, BCPS, CPP [place] [id number]. [address] [place] [address] Phone: [phone] Fax: [phone]

## 2019-07-13 ENCOUNTER — Other Ambulatory Visit: Payer: Self-pay | Admitting: General Surgery

## 2019-07-13 DIAGNOSIS — R058 Other specified cough: Secondary | ICD-10-CM

## 2019-07-13 DIAGNOSIS — R05 Cough: Secondary | ICD-10-CM

## 2019-07-13 DIAGNOSIS — J4530 Mild persistent asthma, uncomplicated: Secondary | ICD-10-CM

## 2019-07-13 DIAGNOSIS — R053 Chronic cough: Secondary | ICD-10-CM

## 2019-07-13 DIAGNOSIS — J4540 Moderate persistent asthma, uncomplicated: Secondary | ICD-10-CM

## 2019-07-13 MED ORDER — MONTELUKAST SODIUM 10 MG PO TABS: 10.0000 mg | ORAL_TABLET | Freq: Every day | ORAL | 6 refills | Status: DC

## 2019-07-20 ENCOUNTER — Encounter: Payer: Self-pay | Admitting: Family Medicine

## 2019-07-21 ENCOUNTER — Ambulatory Visit: Payer: Medicare Other | Attending: Internal Medicine

## 2019-07-21 DIAGNOSIS — Z23 Encounter for immunization: Secondary | ICD-10-CM | POA: Insufficient documentation

## 2019-07-21 NOTE — Progress Notes (Signed)
   Covid-19 Vaccination Clinic  Name:  Mellonie Villwock    MRN: [id number] DOB: [birth year]  07/21/2019  Ms. Nguyen was observed post Covid-19 immunization for 15 minutes without incidence. She was provided with Vaccine Information Sheet and instruction to access the V-Safe system.   Ms. Sisler was instructed to call 911 with any severe reactions post vaccine: Marland Kitchen Difficulty breathing  . Swelling of your face and throat  . A fast heartbeat  . A bad rash all over your body  . Dizziness and weakness    Immunizations Administered    Name Date Dose VIS Date Route   Pfizer COVID-19 Vaccine 07/21/2019 12:07 PM 0.3 mL 05/06/2019 Intramuscular   Manufacturer: [place]   Lot: [id number]   [place]: [id number]

## 2019-07-21 NOTE — Progress Notes (Signed)
Date:  07/26/2019   ID:  Beth Macias, DOB Dec 23, 1950, MRN [id number]  Provider Location: Office  PCP:  Billie Ruddy, MD  Cardiologist:   Johnsie Cancel Electrophysiologist:  None   Evaluation Performed:  Follow-Up Visit  Chief Complaint:  CHF  History of Present Illness:    Beth Macias is a 69 y.o. female who was first seen January 23,2020   for consultation regarding chest pain. Referred by Lennette Bihari [facility] ER  Reviewed ER visit notes from 04/07/18  Lots of URI, congestive symptoms  And urinary frequency. BP was elevated in ER Had abnormal ECG with no old one to compare  Reviewed ECG from 04/07/18 SR PVC isolated LAD and poor R wave progression no acute ST changes Troponin negative x 2 CXR with CE ligular airspace disease could Be scarring , atelectasis or early pneumonia. F/U CXR done 04/26/18 NAD.    Echo done 11/16/18 reviewed EF 30-35% with moderate MR Small to moderate sized pericardial effusion Myovue done 12/13/18 normal perfusion EF 32%   Has had issues with hives since her 30's They got worse when she was started on pulmonary and CHF meds. Not clear culprit. But improved and not thought to be from heart meds. Seen 04/01/19 pharm D and entresto dose kept with increase in coreg. Patient does not like taking diuretic when she goes out Last BNP elevated 5186 02/24/19 prior to med changes K 3.4 replacement increased and Cr normal 0.94 She also indicated unable to tolerate highest dose of entresto   She is upset about donut hole and cost of entresto Taking melatonin to sleep Seen by pharm D  07/07/19 and coreg dose increased She did not want to start statin and did not want to increase entresto dose  She has had her COVID vaccine Feels well with functional class one   The patient  does not have symptoms concerning for COVID-19 infection (fever, chills, cough, or new shortness of breath).    Past Medical History:  Diagnosis  Date  . Asthma   . Blood in stool   . Colon polyps   . Diabetes mellitus without complication ([place])   . Diverticulitis   . Fibromyalgia   . GERD (gastroesophageal reflux disease)   . Gout   . Hypercholesteremia   . Hypertension   . Pneumonia   . Pneumonia   . UTI (urinary tract infection)    Past Surgical History:  Procedure Laterality Date  . APPENDECTOMY    . CESAREAN SECTION     x 3  . LIPOMA EXCISION    . MENISCUS REPAIR Left   . OVARIAN CYST REMOVAL    . OVARIAN CYST SURGERY       Current Meds  Medication Sig  . budesonide-formoterol (SYMBICORT) 80-4.5 MCG/ACT inhaler Inhale 2 puffs into the lungs 2 (two) times daily.  . carvedilol (COREG) 25 MG tablet Take 1 tablet (25 mg total) by mouth 2 (two) times daily.  . Cholecalciferol (VITAMIN D3) 125 MCG (5000 UT) CAPS Take 1,000 Units by mouth at bedtime.   Marland Kitchen glucose blood (ONETOUCH ULTRA) test strip Use to check blood sugar 4-5 times daily  . ipratropium-albuterol (DUONEB) 0.5-2.5 (3) MG/3ML SOLN Take 3 mLs by nebulization every 6 (six) hours as needed.  Marland Kitchen levocetirizine (XYZAL) 5 MG tablet Take 5 mg by mouth every evening.  . magnesium 30 MG tablet Take 400 mg by mouth daily.  . montelukast (SINGULAIR) 10 MG tablet Take  1 tablet (10 mg total) by mouth daily.  Marland Kitchen omeprazole (PRILOSEC) 40 MG capsule Take 1 capsule (40 mg total) by mouth daily.  . potassium chloride 20 MEQ/15ML (10%) SOLN Take 15 mLs (20 mEq total) by mouth daily.  Marland Kitchen PROAIR HFA 108 (90 Base) MCG/ACT inhaler Inhale 2 puffs into the lungs every 4 (four) hours as needed for wheezing.   . sacubitril-valsartan (ENTRESTO) 49-51 MG Take 1 tablet by mouth 2 (two) times daily.  . vitamin C (ASCORBIC ACID) 500 MG tablet Take 500 mg by mouth daily.     Allergies:   Strawberry (diagnostic), Penicillins, Tape, and Gabapentin   Social History   Tobacco Use  . Smoking status: Former Smoker    Packs/day: 0.25    Years: 9.00    Pack years: 2.25    Types: Cigarettes      Quit date: 05/26/1976    Years since quitting: 43.1  . Smokeless tobacco: Never Used  Substance Use Topics  . Alcohol use: Never  . Drug use: Never     Family Hx: The patient's family history includes AAA (abdominal aortic aneurysm) in her father; Colon cancer in her maternal uncle; Crohn's disease in her paternal aunt; Diabetes in her mother; Gout in her brother and mother; Heart attack in her brother; Heart failure in her father and mother; Hypertension in her brother, father, and mother. There is no history of Breast cancer.  ROS:   Please see the history of present illness.     All other systems reviewed and are negative.   Prior CV studies:   The following studies were reviewed today:  Echo 11/16/18 Myovue 12/13/18  LE venous duplex 04/29/19   Labs/Other Tests and Data Reviewed:    EKG:  Not done today   Recent Labs: 12/05/2018: B Natriuretic Peptide 2,464.6 12/10/2018: ALT 15 03/02/2019: TSH 0.552 04/29/2019: NT-Pro BNP 6,097 06/29/2019: BUN 17; Creatinine, Ser 0.75; Hemoglobin 14.2; Magnesium 1.8; Platelets 256.0; Potassium 4.2; Sodium 142   Recent Lipid Panel Lab Results  Component Value Date/Time   CHOL 237 (H) 06/29/2019 10:49 AM   TRIG 152.0 (H) 06/29/2019 10:49 AM   HDL 64.10 06/29/2019 10:49 AM   CHOLHDL 4 06/29/2019 10:49 AM   LDLCALC 143 (H) 06/29/2019 10:49 AM    Wt Readings from Last 3 Encounters:  07/26/19 221 lb (100.2 kg)  07/07/19 218 lb (98.9 kg)  06/29/19 220 lb (99.8 kg)     Objective:    Vital Signs:  BP (!) 136/94   Pulse 80   Ht 5' (1.524 m)   Wt 221 lb (100.2 kg)   SpO2 98%   BMI 43.16 kg/m    Affect appropriate Healthy:  appears stated age HEENT: normal Neck supple with no adenopathy JVP normal no bruits no thyromegaly Lungs clear with no wheezing and good diaphragmatic motion Heart:  S1/S2 no murmur, no rub, gallop or click PMI normal Abdomen: benighn, BS positve, no tenderness, no AAA no bruit.  No HSM or HJR Distal pulses  intact with no bruits Plus one bilateral edema Neuro non-focal Skin warm and dry No muscular weakness   ASSESSMENT & PLAN:    CHF:  Non ischemic DCM continue coreg, and entresto On demedex ( hives when on lasix/aldactone) she takes her diuretic when not going out for the day  No changes for now   Pulmonary:  F/u Wert continue symbicort and pro Air  LE venous duplex 04/29/19 showed no DVT  GERD:  Continue prilosec  COVID-19 Education: The signs and symptoms of COVID-19 were discussed with the patient and how to seek care for testing (follow up with PCP or arrange E-visit).  The importance of social distancing was discussed today.   Medication Adjustments/Labs and Tests Ordered: Current medicines are reviewed at length with the patient today.  Concerns regarding medicines are outlined above.   Tests Ordered:  None   Medication Changes:  None    Signed, Jenkins Rouge, MD  07/26/2019 2:56 PM    [facility]

## 2019-07-26 ENCOUNTER — Encounter: Payer: Self-pay | Admitting: Cardiovascular Disease

## 2019-07-26 ENCOUNTER — Other Ambulatory Visit: Payer: Self-pay

## 2019-07-26 ENCOUNTER — Ambulatory Visit (INDEPENDENT_AMBULATORY_CARE_PROVIDER_SITE_OTHER): Payer: Medicare Other | Admitting: Cardiovascular Disease

## 2019-07-26 VITALS — BP 136/94 | HR 80 | Ht 60.0 in | Wt 221.0 lb

## 2019-07-26 DIAGNOSIS — I420 Dilated cardiomyopathy: Secondary | ICD-10-CM

## 2019-07-26 NOTE — Patient Instructions (Signed)
Medication Instructions:  *If you need a refill on your cardiac medications before your next appointment, please call your pharmacy*  Lab Work: If you have labs (blood work) drawn today and your tests are completely normal, you will receive your results only by: . MyChart Message (if you have MyChart) OR . A paper copy in the mail If you have any lab test that is abnormal or we need to change your treatment, we will call you to review the results.  Testing/Procedures:   Follow-Up: At [facility], you and your health needs are our priority.  As part of our continuing mission to provide you with exceptional heart care, we have created designated Provider Care Teams.  These Care Teams include your primary Cardiologist (physician) and Advanced Practice Providers (APPs -  Physician Assistants and Nurse Practitioners) who all work together to provide you with the care you need, when you need it.  We recommend signing up for the patient portal called "MyChart".  Sign up information is provided on this After Visit Summary.  MyChart is used to connect with patients for Virtual Visits (Telemedicine).  Patients are able to view lab/test results, encounter notes, upcoming appointments, etc.  Non-urgent messages can be sent to your provider as well.   To learn more about what you can do with MyChart, go to [email or web address].    Your next appointment:   6 month(s)  The format for your next appointment:   In Person  Provider:   You may see Peter Nishan, MD or one of the following Advanced Practice Providers on your designated Care Team:    Lori Gerhardt, NP  Laura Ingold, NP  Jill McDaniel, NP  

## 2019-08-16 ENCOUNTER — Ambulatory Visit: Payer: Medicare Other | Attending: Internal Medicine

## 2019-08-16 DIAGNOSIS — Z23 Encounter for immunization: Secondary | ICD-10-CM

## 2019-08-16 NOTE — Progress Notes (Signed)
   Covid-19 Vaccination Clinic  Name:  Beth Macias    MRN: [id number] DOB: [birth year]  08/16/2019  [name] was observed post Covid-19 immunization for 15 minutes without incident. She was provided with Vaccine Information Sheet and instruction to access the V-Safe system.   [name] was instructed to call 911 with any severe reactions post vaccine: Marland Kitchen Difficulty breathing  . Swelling of face and throat  . A fast heartbeat  . A bad rash all over body  . Dizziness and weakness   Immunizations Administered    Name Date Dose VIS Date Route   Pfizer COVID-19 Vaccine 08/16/2019 11:57 AM 0.3 mL 05/06/2019 Intramuscular   Manufacturer: [place]   Lot: [id number]   [place]: [id number]

## 2019-10-05 ENCOUNTER — Other Ambulatory Visit: Payer: Self-pay

## 2019-10-06 ENCOUNTER — Ambulatory Visit (INDEPENDENT_AMBULATORY_CARE_PROVIDER_SITE_OTHER): Payer: PRIVATE HEALTH INSURANCE | Admitting: Family Medicine

## 2019-10-06 ENCOUNTER — Encounter: Payer: Self-pay | Admitting: Family Medicine

## 2019-10-06 DIAGNOSIS — S29012A Strain of muscle and tendon of back wall of thorax, initial encounter: Secondary | ICD-10-CM | POA: Diagnosis not present

## 2019-10-06 DIAGNOSIS — R0789 Other chest pain: Secondary | ICD-10-CM

## 2019-10-06 DIAGNOSIS — S39012A Strain of muscle, fascia and tendon of lower back, initial encounter: Secondary | ICD-10-CM | POA: Diagnosis not present
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
## 2019-10-11 ENCOUNTER — Encounter: Payer: Self-pay | Admitting: Family Medicine

## 2019-10-19 ENCOUNTER — Telehealth: Payer: Self-pay | Admitting: Cardiovascular Disease

## 2019-10-19 NOTE — Telephone Encounter (Signed)
   Pt c/o medication issue:  1. Name of Medication:   sacubitril-valsartan (ENTRESTO) 49-51 MG    2. How are you currently taking this medication (dosage and times per day)?   3. Are you having a reaction (difficulty breathing--STAT)?   4. What is your medication issue? Pt said she needs assistance with this drug since it's too expensive. She said she contacted [employer] and she was told Dr. Johnsie Cancel can get the form to help lower the cost to sign it, she also said if she can get samples while waiting for her approval

## 2019-10-19 NOTE — Telephone Encounter (Signed)
**Note De-Identified Beth Macias Obfuscation** Per note in the pts chart from 04/04/2019 she was approved for pt asst for her [name] through [employer] until 05/26/2019.  I called [name] to see if the pts Entresto cost has gone up which would indicate that she has fallen into the donut hole but was advised that the pt has not gotten a Entresto refill from them at all this year.  I called the pt but got no answer so I left a detailed message advising her to call [name] pt asst program back to request that they mail her an application as we do not keep them in our office and to ask them any questions she may have concerning her eligibility and documents she will need to provide when applying with them.  Also, I need to get a better understanding of where she is getting her [name] as it is important that she takes it daily as directed without interruption. I did leave my name and the office phone number so she can call me back.

## 2019-10-20 NOTE — Telephone Encounter (Addendum)
**Note De-Identified Beth Macias Obfuscation** The pt states that she picked up her refill of Entresto today and it cost $45/30 day supply and that even though she can afford that it is still hard as she lives on a fixed income.  She states that she has contacted [name] pt asst foundation and that they are mailing her an application so she can go ahead and apply for asst for her Beth Macias prior to going into the "donut hole".  She states that once she receives the application she will complete her part, obtain required documents and will bring all to the office to drop off so we can take care of the provider part of the application and fax all to [name] pt asst foundation.  She thanked me for calling her back.

## 2019-10-20 NOTE — Telephone Encounter (Signed)
Pt calling requesting a call back from [place], [place]. Please return call at [phone]. Thanks

## 2019-10-20 NOTE — Telephone Encounter (Signed)
Daliyah is calling stating she is returning Lynn's call. I advised Cambryn that secure chat states she is unavailable until 11:59 PM tomorrow and that I would send her a message and have her callback then. Please advise.

## 2019-10-21 ENCOUNTER — Ambulatory Visit: Payer: PRIVATE HEALTH INSURANCE | Attending: Family Medicine

## 2019-10-21 ENCOUNTER — Other Ambulatory Visit: Payer: Self-pay

## 2019-10-21 DIAGNOSIS — M545 Low back pain, unspecified: Secondary | ICD-10-CM

## 2019-10-21 DIAGNOSIS — R29898 Other symptoms and signs involving the musculoskeletal system: Secondary | ICD-10-CM | POA: Insufficient documentation

## 2019-10-21 DIAGNOSIS — M2560 Stiffness of unspecified joint, not elsewhere classified: Secondary | ICD-10-CM | POA: Insufficient documentation

## 2019-10-21 DIAGNOSIS — M2569 Stiffness of other specified joint, not elsewhere classified: Secondary | ICD-10-CM

## 2019-10-21 DIAGNOSIS — M542 Cervicalgia: Secondary | ICD-10-CM

## 2019-10-21 DIAGNOSIS — M549 Dorsalgia, unspecified: Secondary | ICD-10-CM | POA: Diagnosis present
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
## 2019-10-25 NOTE — Patient Instructions (Signed)
HEP for SKTC, PPT, cervical retraction, cervical rotation to improve ROM and decrease pain. Pt to complete as tolerated.

## 2019-11-03 ENCOUNTER — Other Ambulatory Visit: Payer: Self-pay

## 2019-11-03 ENCOUNTER — Ambulatory Visit: Payer: Medicare Other | Attending: Family Medicine | Admitting: Physical Therapy

## 2019-11-03 DIAGNOSIS — M545 Low back pain, unspecified: Secondary | ICD-10-CM

## 2019-11-03 DIAGNOSIS — M2569 Stiffness of other specified joint, not elsewhere classified: Secondary | ICD-10-CM

## 2019-11-03 DIAGNOSIS — M549 Dorsalgia, unspecified: Secondary | ICD-10-CM | POA: Diagnosis present

## 2019-11-03 DIAGNOSIS — M2560 Stiffness of unspecified joint, not elsewhere classified: Secondary | ICD-10-CM | POA: Diagnosis present

## 2019-11-03 DIAGNOSIS — R29898 Other symptoms and signs involving the musculoskeletal system: Secondary | ICD-10-CM | POA: Diagnosis present

## 2019-11-03 DIAGNOSIS — M542 Cervicalgia: Secondary | ICD-10-CM

## 2019-11-03 NOTE — Patient Instructions (Signed)
Access Code: [email or web address] URL: [email or web address] Date: 11/03/2019 Prepared by: Estill Bamberg April Thurnell Garbe  Exercises Prone Scapular Slide with Shoulder Extension - 1 x daily - 7 x weekly - 10 reps - 3 sets Supine Cervical Retraction with Towel - 1 x daily - 7 x weekly - 10 reps - 3 sets Supine Piriformis Stretch with Foot on Ground - 1 x daily - 7 x weekly - 3 sets - 1 reps - 60 sec hold Supine Transversus Abdominis Bracing - Hands on Stomach - 1 x daily - 7 x weekly - 3 sets - 10 reps - 2 sec hold Supine March - 1 x daily - 7 x weekly - 10 reps - 3 sets
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
## 2019-11-04 ENCOUNTER — Ambulatory Visit: Payer: Medicare Other

## 2019-11-04 DIAGNOSIS — M542 Cervicalgia: Secondary | ICD-10-CM

## 2019-11-04 DIAGNOSIS — M2569 Stiffness of other specified joint, not elsewhere classified: Secondary | ICD-10-CM

## 2019-11-04 DIAGNOSIS — M545 Low back pain, unspecified: Secondary | ICD-10-CM

## 2019-11-04 DIAGNOSIS — M549 Dorsalgia, unspecified: Secondary | ICD-10-CM

## 2019-11-04 DIAGNOSIS — R29898 Other symptoms and signs involving the musculoskeletal system: Secondary | ICD-10-CM

## 2019-11-06 NOTE — Therapy (Signed)
[place], [place], [address] Phone: [phone]   Fax:  [phone]  Physical Therapy Treatment  Patient Details  Name: Beth Macias MRN: [id number] Date of Birth: [birth year] Referring Provider (PT): Billie Ruddy, MD   Encounter Date: 11/04/2019   PT End of Session - 11/06/19 0805    Visit Number 3    Number of Visits 13    Date for PT Re-Evaluation 12/13/19    Authorization Type MEDICARE PART A AND B    Progress Note Due on Visit 10    PT Start Time 0915    PT Stop Time 1015    PT Time Calculation (min) 60 min    Activity Tolerance Patient tolerated treatment well    Behavior During Therapy [facility] for tasks assessed/performed           Past Medical History:  Diagnosis Date  . Asthma   . Blood in stool   . Colon polyps   . Diabetes mellitus without complication ([place])   . Diverticulitis   . Fibromyalgia   . GERD (gastroesophageal reflux disease)   . Gout   . Hypercholesteremia   . Hypertension   . Pneumonia   . Pneumonia   . UTI (urinary tract infection)     Past Surgical History:  Procedure Laterality Date  . APPENDECTOMY    . CESAREAN SECTION     x 3  . LIPOMA EXCISION    . MENISCUS REPAIR Left   . OVARIAN CYST REMOVAL    . OVARIAN CYST SURGERY      There were no vitals filed for this visit.                      [facility] Adult PT Treatment/Exercise - 11/06/19 0001      Exercises   Exercises Neck;Shoulder;Lumbar      Neck Exercises: Machines for Strengthening   Nustep 5 min; L3; UE/LE      Neck Exercises: Supine   Neck Retraction 10 reps;3 secs    Cervical Rotation Both;5 reps      Lumbar Exercises: Stretches   Active Hamstring Stretch Right;Left;2 reps;20 seconds    Active Hamstring Stretch Limitations seated    Piriformis Stretch Right;Left;20 seconds;2 reps      Lumbar Exercises: Supine   Ab Set 10 reps    Bent Knee Raise 10  reps    Bent Knee Raise Limitations ab engagement    Bridge 10 reps;2 seconds    Bridge Limitations ab engagement; better stability      Moist Heat Therapy   Number Minutes Moist Heat 15 Minutes    Moist Heat Location Cervical;Lumbar Spine;Other (comment)   thoracic spine                 PT Education - 11/06/19 0805    Education Details Seated hamstring stretch was added to HEP.    Person(s) Educated Patient            PT Short Term Goals - 10/25/19 1007      PT SHORT TERM GOAL #1   Title Pt will be Ind c an initial HEP    Baseline in progress    Time 3    Period Weeks    Status New    Target Date 11/15/19      PT SHORT TERM GOAL #2   Title Pt will voice understanding of measure to manage and reduce pain  Baseline Decreased understanding    Time 3    Period Weeks    Status New    Target Date 11/15/19      PT SHORT TERM GOAL #3   Title Complete 5x STS and 2 minute walking tests for measures of functional mobility and set LTGs    Baseline not completed    Time 3    Period Weeks    Status New    Target Date 11/15/19             PT Long Term Goals - 10/25/19 1013      PT LONG TERM GOAL #1   Title Improve cervical and trunk SB ROM by 5 to 10d and rotation 25% or less limitation    Baseline See eval flowsheet for cervical SB ROMs and rotations were found to be 50% limited    Time 7    Period Weeks    Status New    Target Date 12/13/19      PT LONG TERM GOAL #2   Title Pt will be Ind in a final HEP to improve strength, ROM, pain and functional mobility    Baseline In progress    Time 7    Period Weeks    Status New    Target Date 12/13/19      PT LONG TERM GOAL #3   Title Pt will report improved pain levels to 0-4 with daily activities    Baseline 0-9/10    Time 7    Period Weeks    Status New    Target Date 12/13/19      PT LONG TERM GOAL #4   Title Pt's FOTO score will improve to the predicted limitation of 44%    Baseline 62%  limitation    Time 7    Period Weeks    Status New    Target Date 12/13/19                 Plan - 11/06/19 0809    Clinical Impression Statement PT focused on ROM, flexibility, and strengthening of the cervical, core and LEs, pain management, and development of a HEP. Pt is compeleting her exercises correctly and reports benefit from the ROM and stretching c pain management.    Personal Factors and Comorbidities Age;Comorbidity 1;Comorbidity 2;Comorbidity 3+    Comorbidities DM, CHF, obesity, fibromyalgia    Examination-Activity Limitations Bed Mobility;Carry;Lift;Stand;Stairs;Squat;Sit;Reach Overhead;Locomotion Level    Stability/Clinical Decision Making Evolving/Moderate complexity    Clinical Decision Making Moderate    Rehab Potential Good    PT Frequency 2x / week    PT Duration 6 weeks    PT Treatment/Interventions ADLs/Self Care Home Management;Cryotherapy;Electrical Stimulation;Iontophoresis 4mg /ml Dexamethasone;Moist Heat;Traction;Ultrasound;Neuromuscular re-education;Therapeutic exercise;Therapeutic activities;Functional mobility training;Stair training;Gait training;Patient/family education;Manual techniques;Dry needling;Passive range of motion;Taping;Spinal Manipulations;Joint Manipulations    PT Next Visit Plan Assess response to HEP. Complete 5xSTS and 2 min walking test. Progress cervicothoracic and core/lumbar strengthening. Consider dry needling. Provide manual therapy and modalities as needed.    PT Home Exercise Plan Access Code: [email or web address]. Seated hamstring stretch added to HEP           Patient will benefit from skilled therapeutic intervention in order to improve the following deficits and impairments:  Difficulty walking, Decreased range of motion, Increased muscle spasms, Impaired UE functional use, Obesity, Decreased activity tolerance, Pain, Impaired flexibility, Decreased mobility, Decreased strength, Postural dysfunction  Visit Diagnosis: Cervical  pain  Mid-back pain, acute  Acute midline low back pain without  sciatica  Decreased ROM of neck  Decreased ROM of trunk and back     Problem List Patient Active Problem List   Diagnosis Date Noted  . CHF (congestive heart failure) ([place]) 12/10/2018  . History of recurrent UTIs 06/30/2018  . Mild intermittent asthma without complication 18/48/5927  . Prediabetes 06/30/2018  . Environmental and seasonal allergies 06/30/2018  . Vitamin D deficiency 08/07/2016  . Morbid obesity with BMI of 40.0-44.9, adult ([place]) 02/18/2016  . Extrinsic asthma without complication 63/94/3200  . SOB (shortness of breath) 06/19/2015  . S/P carpal tunnel release 07/24/2014  . S/P cubital tunnel release 07/24/2014  . Cubital tunnel syndrome, right 07/06/2014  . Right carpal tunnel syndrome 07/06/2014  . Numbness of right hand 05/21/2014  . Hypokalemia 08/16/2013  . Diabetes mellitus type 2, uncomplicated ([place]) 37/94/4461  . GERD (gastroesophageal reflux disease) 08/28/2011  . HTN (hypertension) 08/28/2011   Gar Ponto MS, PT 11/06/19 8:22 AM  [facility] [address] [place], [place], [address] Phone: [phone]   Fax:  [phone]  Name: Zyon Grout MRN: [id number] Date of Birth: 02-20-1951

## 2019-11-08 ENCOUNTER — Ambulatory Visit: Payer: Medicare Other

## 2019-11-08 ENCOUNTER — Other Ambulatory Visit: Payer: Self-pay

## 2019-11-08 DIAGNOSIS — M545 Low back pain, unspecified: Secondary | ICD-10-CM

## 2019-11-08 DIAGNOSIS — M2569 Stiffness of other specified joint, not elsewhere classified: Secondary | ICD-10-CM

## 2019-11-08 DIAGNOSIS — M542 Cervicalgia: Secondary | ICD-10-CM | POA: Diagnosis not present

## 2019-11-08 DIAGNOSIS — M549 Dorsalgia, unspecified: Secondary | ICD-10-CM

## 2019-11-08 DIAGNOSIS — R29898 Other symptoms and signs involving the musculoskeletal system: Secondary | ICD-10-CM

## 2019-11-08 NOTE — Therapy (Signed)
[place], [place], [address] Phone: [phone]   Fax:  [phone]  Physical Therapy Treatment  Patient Details  Name: Beth Macias MRN: [id number] Date of Birth: July 19, 1950 Referring Provider (PT): Billie Ruddy, MD   Encounter Date: 11/08/2019   PT End of Session - 11/08/19 1749    Visit Number 4    Number of Visits 13    Date for PT Re-Evaluation 12/13/19    Authorization Type MEDICARE PART A AND B    Progress Note Due on Visit 10    PT Start Time 1449    PT Stop Time 1535    PT Time Calculation (min) 46 min    Activity Tolerance Patient tolerated treatment well    Behavior During Therapy [facility] for tasks assessed/performed           Past Medical History:  Diagnosis Date  . Asthma   . Blood in stool   . Colon polyps   . Diabetes mellitus without complication ([place])   . Diverticulitis   . Fibromyalgia   . GERD (gastroesophageal reflux disease)   . Gout   . Hypercholesteremia   . Hypertension   . Pneumonia   . Pneumonia   . UTI (urinary tract infection)     Past Surgical History:  Procedure Laterality Date  . APPENDECTOMY    . CESAREAN SECTION     x 3  . LIPOMA EXCISION    . MENISCUS REPAIR Left   . OVARIAN CYST REMOVAL    . OVARIAN CYST SURGERY      There were no vitals filed for this visit.   Subjective Assessment - 11/08/19 1455    Subjective Pt reports her HEP is helping her pain and flexibility. Rates her low back pain as a 4/10, R shoulder mid back a 8/10                             [facility] Adult PT Treatment/Exercise - 11/08/19 0001      Exercises   Exercises Neck;Shoulder;Lumbar      Neck Exercises: Machines for Strengthening   Nustep 8 min; L5; UE/LE      Neck Exercises: Seated   Neck Retraction 5 reps;5 secs    Cervical Rotation Left;Both;5 reps    Cervical Rotation Limitations gentle pt.over pressure    Lateral Flexion  Right;Left;5 reps    Lateral Flexion Limitations 5 secs; anchored UE      Lumbar Exercises: Stretches   Active Hamstring Stretch Right;Left;20 seconds;3 reps    Active Hamstring Stretch Limitations seated      Manual Therapy   Manual Therapy Soft tissue mobilization    Soft tissue mobilization STW to bilat upper trap, rhomboids, levator scpulae;with more emphasis on the R    Increased muscle tension was noticed R vs. L                 PT Education - 11/08/19 1747    Education Details HEP: Upper trap stretch added to the HEP    Person(s) Educated Patient    Methods Explanation;Demonstration;Tactile cues;Verbal cues;Handout    Comprehension Tactile cues required;Verbal cues required;Returned demonstration;Verbalized understanding;Need further instruction            PT Short Term Goals - 10/25/19 1007      PT SHORT TERM GOAL #1   Title Pt will be Ind c an initial HEP  Baseline in progress    Time 3    Period Weeks    Status New    Target Date 11/15/19      PT SHORT TERM GOAL #2   Title Pt will voice understanding of measure to manage and reduce pain    Baseline Decreased understanding    Time 3    Period Weeks    Status New    Target Date 11/15/19      PT SHORT TERM GOAL #3   Title Complete 5x STS and 2 minute walking tests for measures of functional mobility and set LTGs    Baseline not completed    Time 3    Period Weeks    Status New    Target Date 11/15/19             PT Long Term Goals - 10/25/19 1013      PT LONG TERM GOAL #1   Title Improve cervical and trunk SB ROM by 5 to 10d and rotation 25% or less limitation    Baseline See eval flowsheet for cervical SB ROMs and rotations were found to be 50% limited    Time 7    Period Weeks    Status New    Target Date 12/13/19      PT LONG TERM GOAL #2   Title Pt will be Ind in a final HEP to improve strength, ROM, pain and functional mobility    Baseline In progress    Time 7    Period Weeks     Status New    Target Date 12/13/19      PT LONG TERM GOAL #3   Title Pt will report improved pain levels to 0-4 with daily activities    Baseline 0-9/10    Time 7    Period Weeks    Status New    Target Date 12/13/19      PT LONG TERM GOAL #4   Title Pt's FOTO score will improve to the predicted limitation of 44%    Baseline 62% limitation    Time 7    Period Weeks    Status New    Target Date 12/13/19                 Plan - 11/08/19 1751    Clinical Impression Statement PT focused on R upper shoulder pain with ROM and flexibility exs completed. An upper trap stretch was added to the pt's HEP as well as STW completed to the upper traps and rhomboids, R more so than, with the R bothering the pt more. Pt's subjective report indicates overall improvement in pain and flexibility.    Personal Factors and Comorbidities Age;Comorbidity 1;Comorbidity 2;Comorbidity 3+    Comorbidities DM, CHF, obesity, fibromyalgia    Examination-Activity Limitations Bed Mobility;Carry;Lift;Stand;Stairs;Squat;Sit;Reach Overhead;Locomotion Level    Stability/Clinical Decision Making Evolving/Moderate complexity    Clinical Decision Making Moderate    Rehab Potential Good    PT Frequency 2x / week    PT Duration 6 weeks    PT Treatment/Interventions ADLs/Self Care Home Management;Cryotherapy;Electrical Stimulation;Iontophoresis 4mg /ml Dexamethasone;Moist Heat;Traction;Ultrasound;Neuromuscular re-education;Therapeutic exercise;Therapeutic activities;Functional mobility training;Stair training;Gait training;Patient/family education;Manual techniques;Dry needling;Passive range of motion;Taping;Spinal Manipulations;Joint Manipulations    PT Next Visit Plan Assess response to HEP and massge. Complete 5xSTS and 2 min walking test. Progress cervicothoracic and core/lumbar strengthening. Consider dry needling. Provide manual therapy and modalities as needed.    PT Home Exercise Plan Access Code: [email or web address].  Upper trap stretch added  Consulted and Agree with Plan of Care Patient           Patient will benefit from skilled therapeutic intervention in order to improve the following deficits and impairments:  Difficulty walking, Decreased range of motion, Increased muscle spasms, Impaired UE functional use, Obesity, Decreased activity tolerance, Pain, Impaired flexibility, Decreased mobility, Decreased strength, Postural dysfunction  Visit Diagnosis: Cervical pain  Mid-back pain, acute  Acute midline low back pain without sciatica  Decreased ROM of neck  Decreased ROM of trunk and back     Problem List Patient Active Problem List   Diagnosis Date Noted  . CHF (congestive heart failure) ([place]) 12/10/2018  . History of recurrent UTIs 06/30/2018  . Mild intermittent asthma without complication 32/99/2426  . Prediabetes 06/30/2018  . Environmental and seasonal allergies 06/30/2018  . Vitamin D deficiency 08/07/2016  . Morbid obesity with BMI of 40.0-44.9, adult ([place]) 02/18/2016  . Extrinsic asthma without complication 83/41/9622  . SOB (shortness of breath) 06/19/2015  . S/P carpal tunnel release 07/24/2014  . S/P cubital tunnel release 07/24/2014  . Cubital tunnel syndrome, right 07/06/2014  . Right carpal tunnel syndrome 07/06/2014  . Numbness of right hand 05/21/2014  . Hypokalemia 08/16/2013  . Diabetes mellitus type 2, uncomplicated ([place]) 29/79/8921  . GERD (gastroesophageal reflux disease) 08/28/2011  . HTN (hypertension) 08/28/2011    Gar Ponto MS, PT 11/08/19 6:00 PM  [facility] [address] [place], [place], [address] Phone: [phone]   Fax:  [phone]  Name: Beth Macias MRN: [id number] Date of Birth: 10-22-1950

## 2019-11-10 ENCOUNTER — Ambulatory Visit: Payer: Medicare Other

## 2019-11-10 ENCOUNTER — Other Ambulatory Visit: Payer: Self-pay

## 2019-11-10 DIAGNOSIS — M545 Low back pain, unspecified: Secondary | ICD-10-CM

## 2019-11-10 DIAGNOSIS — M542 Cervicalgia: Secondary | ICD-10-CM | POA: Diagnosis not present

## 2019-11-10 DIAGNOSIS — M2569 Stiffness of other specified joint, not elsewhere classified: Secondary | ICD-10-CM

## 2019-11-10 DIAGNOSIS — R29898 Other symptoms and signs involving the musculoskeletal system: Secondary | ICD-10-CM

## 2019-11-10 DIAGNOSIS — M549 Dorsalgia, unspecified: Secondary | ICD-10-CM

## 2019-11-10 NOTE — Therapy (Signed)
[place], [place], [address] Phone: [phone]   Fax:  [phone]  Physical Therapy Treatment  Patient Details  Name: Beth Macias MRN: [id number] Date of Birth: July 18, 1950 Referring Provider (PT): Billie Ruddy, MD   Encounter Date: 11/10/2019   PT End of Session - 11/10/19 1526    Visit Number 5    Number of Visits 13    Date for PT Re-Evaluation 12/13/19    Authorization Type MEDICARE PART A AND B    PT Start Time 1449    PT Stop Time 1548    PT Time Calculation (min) 59 min    Activity Tolerance Patient tolerated treatment well    Behavior During Therapy [facility] for tasks assessed/performed           Past Medical History:  Diagnosis Date  . Asthma   . Blood in stool   . Colon polyps   . Diabetes mellitus without complication ([place])   . Diverticulitis   . Fibromyalgia   . GERD (gastroesophageal reflux disease)   . Gout   . Hypercholesteremia   . Hypertension   . Pneumonia   . Pneumonia   . UTI (urinary tract infection)     Past Surgical History:  Procedure Laterality Date  . APPENDECTOMY    . CESAREAN SECTION     x 3  . LIPOMA EXCISION    . MENISCUS REPAIR Left   . OVARIAN CYST REMOVAL    . OVARIAN CYST SURGERY      There were no vitals filed for this visit.   Subjective Assessment - 11/10/19 1459    Subjective Pt continues to report making good progress in all areas.                             [facility] Adult PT Treatment/Exercise - 11/10/19 0001      Neck Exercises: Seated   Neck Retraction 5 reps;5 secs    Cervical Rotation Left;Both;5 reps    Cervical Rotation Limitations gentle pt.over pressure    Lateral Flexion Right;Left;5 reps    Lateral Flexion Limitations 5 secs; anchored UE      Lumbar Exercises: Stretches   Active Hamstring Stretch Right;Left;20 seconds;3 reps    Active Hamstring Stretch Limitations seated    Lower  Trunk Rotation 3 reps    Lower Trunk Rotation Limitations 5 sec    Piriformis Stretch Right;Left;20 seconds;2 reps      Lumbar Exercises: Supine   Ab Set 10 reps    Bent Knee Raise 10 reps    Bent Knee Raise Limitations ab engagement    Bridge 10 reps;2 seconds    Bridge Limitations ab engagement; better stability      Lumbar Exercises: Prone   Other Prone Lumbar Exercises Scapular arms by side extension.      Moist Heat Therapy   Number Minutes Moist Heat 15 Minutes    Moist Heat Location Cervical;Lumbar Spine                    PT Short Term Goals - 10/25/19 1007      PT SHORT TERM GOAL #1   Title Pt will be Ind c an initial HEP    Baseline in progress    Time 3    Period Weeks    Status New    Target Date 11/15/19      PT  SHORT TERM GOAL #2   Title Pt will voice understanding of measure to manage and reduce pain    Baseline Decreased understanding    Time 3    Period Weeks    Status New    Target Date 11/15/19      PT SHORT TERM GOAL #3   Title Complete 5x STS and 2 minute walking tests for measures of functional mobility and set LTGs    Baseline not completed    Time 3    Period Weeks    Status New    Target Date 11/15/19             PT Long Term Goals - 10/25/19 1013      PT LONG TERM GOAL #1   Title Improve cervical and trunk SB ROM by 5 to 10d and rotation 25% or less limitation    Baseline See eval flowsheet for cervical SB ROMs and rotations were found to be 50% limited    Time 7    Period Weeks    Status New    Target Date 12/13/19      PT LONG TERM GOAL #2   Title Pt will be Ind in a final HEP to improve strength, ROM, pain and functional mobility    Baseline In progress    Time 7    Period Weeks    Status New    Target Date 12/13/19      PT LONG TERM GOAL #3   Title Pt will report improved pain levels to 0-4 with daily activities    Baseline 0-9/10    Time 7    Period Weeks    Status New    Target Date 12/13/19      PT  LONG TERM GOAL #4   Title Pt's FOTO score will improve to the predicted limitation of 44%    Baseline 62% limitation    Time 7    Period Weeks    Status New    Target Date 12/13/19                 Plan - 11/10/19 1551    Clinical Impression Statement Pt's subjective report indicates she is feeling better. With ther ex, walking, STS, and sit t/f supine on the mat table, pt demonstrates good quality of movement. Pt is participating in PT with good effort.    Personal Factors and Comorbidities Age;Comorbidity 1;Comorbidity 2;Comorbidity 3+    Comorbidities DM, CHF, obesity, fibromyalgia    Examination-Activity Limitations Bed Mobility;Carry;Lift;Stand;Stairs;Squat;Sit;Reach Overhead;Locomotion Level    Stability/Clinical Decision Making Evolving/Moderate complexity    Clinical Decision Making Moderate    Rehab Potential Good    PT Frequency 2x / week    PT Duration 6 weeks    PT Treatment/Interventions ADLs/Self Care Home Management;Cryotherapy;Electrical Stimulation;Iontophoresis 4mg /ml Dexamethasone;Moist Heat;Traction;Ultrasound;Neuromuscular re-education;Therapeutic exercise;Therapeutic activities;Functional mobility training;Stair training;Gait training;Patient/family education;Manual techniques;Dry needling;Passive range of motion;Taping;Spinal Manipulations;Joint Manipulations    PT Next Visit Plan Assess response to HEP and massge. Complete 5xSTS and 2 min walking test. Progress cervicothoracic and core/lumbar strengthening. Consider dry needling. Provide manual therapy and modalities as needed.    PT Home Exercise Plan Access Code: [email or web address].    Consulted and Agree with Plan of Care Patient           Patient will benefit from skilled therapeutic intervention in order to improve the following deficits and impairments:  Difficulty walking, Decreased range of motion, Increased muscle spasms, Impaired UE functional use, Obesity, Decreased activity tolerance,  Pain, Impaired  flexibility, Decreased mobility, Decreased strength, Postural dysfunction  Visit Diagnosis: Cervical pain  Mid-back pain, acute  Acute midline low back pain without sciatica  Decreased ROM of neck  Decreased ROM of trunk and back     Problem List Patient Active Problem List   Diagnosis Date Noted  . CHF (congestive heart failure) ([place]) 12/10/2018  . History of recurrent UTIs 06/30/2018  . Mild intermittent asthma without complication 57/26/2035  . Prediabetes 06/30/2018  . Environmental and seasonal allergies 06/30/2018  . Vitamin D deficiency 08/07/2016  . Morbid obesity with BMI of 40.0-44.9, adult ([place]) 02/18/2016  . Extrinsic asthma without complication 59/74/1638  . SOB (shortness of breath) 06/19/2015  . S/P carpal tunnel release 07/24/2014  . S/P cubital tunnel release 07/24/2014  . Cubital tunnel syndrome, right 07/06/2014  . Right carpal tunnel syndrome 07/06/2014  . Numbness of right hand 05/21/2014  . Hypokalemia 08/16/2013  . Diabetes mellitus type 2, uncomplicated ([place]) 45/36/4680  . GERD (gastroesophageal reflux disease) 08/28/2011  . HTN (hypertension) 08/28/2011    Gar Ponto MS, PT 11/10/19 3:59 PM  [facility] [address] [place], [place], [address] Phone: [phone]   Fax:  [phone]  Name: Beth Macias MRN: [id number] Date of Birth: 11-08-1950

## 2019-11-14 ENCOUNTER — Telehealth: Payer: Self-pay

## 2019-11-14 NOTE — Telephone Encounter (Signed)
Received email. Will print and fax now.

## 2019-11-14 NOTE — Telephone Encounter (Addendum)
**Note De-Identified Norvel Wenker Obfuscation** The pt left her completed [name] pt asst application at the office. I have completed the provider page of the application (Dr Johnsie Cancel has signed it) and faxed all to Dr Mariana Arn nurse so she can fax to [employer] pt asst [employer] at the fax number written on the cover letter included.Marland Kitchen

## 2019-11-15 ENCOUNTER — Ambulatory Visit: Payer: Medicare Other

## 2019-11-15 ENCOUNTER — Other Ambulatory Visit: Payer: Self-pay

## 2019-11-15 DIAGNOSIS — M542 Cervicalgia: Secondary | ICD-10-CM | POA: Diagnosis not present

## 2019-11-15 DIAGNOSIS — M2569 Stiffness of other specified joint, not elsewhere classified: Secondary | ICD-10-CM

## 2019-11-15 DIAGNOSIS — M549 Dorsalgia, unspecified: Secondary | ICD-10-CM

## 2019-11-15 DIAGNOSIS — M545 Low back pain, unspecified: Secondary | ICD-10-CM

## 2019-11-15 DIAGNOSIS — R29898 Other symptoms and signs involving the musculoskeletal system: Secondary | ICD-10-CM

## 2019-11-16 ENCOUNTER — Encounter: Payer: Self-pay | Admitting: Podiatry

## 2019-11-16 ENCOUNTER — Other Ambulatory Visit: Payer: Self-pay

## 2019-11-16 ENCOUNTER — Ambulatory Visit (INDEPENDENT_AMBULATORY_CARE_PROVIDER_SITE_OTHER): Payer: Medicare Other | Admitting: Podiatry

## 2019-11-16 DIAGNOSIS — M79676 Pain in unspecified toe(s): Secondary | ICD-10-CM

## 2019-11-16 DIAGNOSIS — B351 Tinea unguium: Secondary | ICD-10-CM | POA: Diagnosis not present

## 2019-11-16 DIAGNOSIS — E119 Type 2 diabetes mellitus without complications: Secondary | ICD-10-CM | POA: Diagnosis not present

## 2019-11-16 NOTE — Telephone Encounter (Signed)
**Note De-Identified Diannah Rindfleisch Obfuscation** Letter received from [employer] pt asst [employer] Payton Prinsen fax stating that they have approved the pt for asst with her [employer]. Approval is good for the remainder of 2021. Patient ID: [id number]  The letter states that they have notified the pt of this approval as well.

## 2019-11-16 NOTE — Progress Notes (Signed)
Subjective:  Patient ID: Beth Macias, female    DOB: 08/20/1950,  MRN: [id number] HPI Chief Complaint  Patient presents with  . Nail Problem    nail trim.  "I have a toenail on my right toe that is thick and black.  It hurts to wear sneakers sometimes"    69 y.o. female presents with the above complaint.   ROS: Denies fever chills nausea vomiting muscle aches pains calf pain back pain chest pain shortness of breath.  Past Medical History:  Diagnosis Date  . Asthma   . Blood in stool   . Colon polyps   . Diabetes mellitus without complication ([place])   . Diverticulitis   . Fibromyalgia   . GERD (gastroesophageal reflux disease)   . Gout   . Hypercholesteremia   . Hypertension   . Pneumonia   . Pneumonia   . UTI (urinary tract infection)    Past Surgical History:  Procedure Laterality Date  . APPENDECTOMY    . CESAREAN SECTION     x 3  . LIPOMA EXCISION    . MENISCUS REPAIR Left   . OVARIAN CYST REMOVAL    . OVARIAN CYST SURGERY      Current Outpatient Medications:  .  budesonide-formoterol (SYMBICORT) 80-4.5 MCG/ACT inhaler, Inhale 2 puffs into the lungs 2 (two) times daily., Disp: 1 Inhaler, Rfl: 11 .  carvedilol (COREG) 25 MG tablet, Take 1 tablet (25 mg total) by mouth 2 (two) times daily., Disp: 180 tablet, Rfl: 3 .  Cholecalciferol (VITAMIN D3) 125 MCG (5000 UT) CAPS, Take 1,000 Units by mouth at bedtime. , Disp: , Rfl:  .  famotidine (PEPCID) 40 MG tablet, SMARTSIG:1 Tablet(s) By Mouth Every 12 Hours PRN, Disp: , Rfl:  .  glucose blood (ONETOUCH ULTRA) test strip, Use to check blood sugar 4-5 times daily, Disp: 50 each, Rfl: 5 .  ipratropium-albuterol (DUONEB) 0.5-2.5 (3) MG/3ML SOLN, Take 3 mLs by nebulization every 6 (six) hours as needed., Disp: , Rfl:  .  levocetirizine (XYZAL) 5 MG tablet, Take 5 mg by mouth every evening., Disp: , Rfl:  .  magnesium 30 MG tablet, Take 400 mg by mouth daily., Disp: , Rfl:  .  metroNIDAZOLE (METROGEL) 0.75 %  vaginal gel, SMARTSIG:1 Application Vaginal Every Night, Disp: , Rfl:  .  montelukast (SINGULAIR) 10 MG tablet, Take 1 tablet (10 mg total) by mouth daily., Disp: 30 tablet, Rfl: 6 .  omeprazole (PRILOSEC) 40 MG capsule, Take 1 capsule (40 mg total) by mouth daily., Disp: 30 capsule, Rfl: 6 .  potassium chloride 20 MEQ/15ML (10%) SOLN, Take 15 mLs (20 mEq total) by mouth daily., Disp: 946 mL, Rfl: 11 .  PROAIR HFA 108 (90 Base) MCG/ACT inhaler, Inhale 2 puffs into the lungs every 4 (four) hours as needed for wheezing. , Disp: , Rfl: 9 .  sacubitril-valsartan (ENTRESTO) 49-51 MG, Take 1 tablet by mouth 2 (two) times daily., Disp: 60 tablet, Rfl: 11 .  torsemide (DEMADEX) 20 MG tablet, Take 1 tablet (20 mg total) by mouth daily. (Patient taking differently: Take 20 mg by mouth daily. Patient takes 1/2-1 tablet), Disp: 90 tablet, Rfl: 3 .  vitamin C (ASCORBIC ACID) 500 MG tablet, Take 500 mg by mouth daily., Disp: , Rfl:   Allergies  Allergen Reactions  . Strawberry (Diagnostic) Anaphylaxis and Other (See Comments)    hives  . Penicillins Itching    Has patient had a PCN reaction causing immediate rash, facial/tongue/throat swelling, SOB or  lightheadedness with hypotension: No Has patient had a PCN reaction causing severe rash involving mucus membranes or skin necrosis: No Has patient had a PCN reaction that required hospitalization: Unknown Has patient had a PCN reaction occurring within the last 10 years: No If all of the above answers are "NO", then may proceed with Cephalosporin use.  . Tape Other (See Comments)    Blisters  . Gabapentin Itching, Rash and Other (See Comments)    Incoherent   Review of Systems Objective:  There were no vitals filed for this visit.  General: Well developed, nourished, in no acute distress, alert and oriented x3   Dermatological: Skin is warm, dry and supple bilateral. Nails x 10 are thick yellow dystrophic clinically mycotic bilaterally.; remaining  integument appears unremarkable at this time. There are no open sores, no preulcerative lesions, no rash or signs of infection present.  Vascular: Dorsalis Pedis artery and Posterior Tibial artery pedal pulses are 2/4 bilateral with immedate capillary fill time. Pedal hair growth present. No varicosities and no lower extremity edema present bilateral.   Neruologic: Grossly intact via light touch bilateral. Vibratory intact via tuning fork bilateral. Protective threshold with Semmes Wienstein monofilament intact to all pedal sites bilateral. Patellar and Achilles deep tendon reflexes 2+ bilateral. No Babinski or clonus noted bilateral.   Musculoskeletal: No gross boney pedal deformities bilateral. No pain, crepitus, or limitation noted with foot and ankle range of motion bilateral. Muscular strength 5/5 in all groups tested bilateral.  Gait: Unassisted, Nonantalgic.    Radiographs:  None taken  Assessment & Plan:   Assessment: Pain in limb secondary to onychomycosis.  Plan: Debridement of toenails 1 through 5 bilateral.     Eduardo Wurth T. [place], [place]

## 2019-11-16 NOTE — Therapy (Signed)
[place], [place], [address] Phone: [phone]   Fax:  [phone]  Physical Therapy Treatment  Patient Details  Name: Beth Macias MRN: [id number] Date of Birth: [birth year] Referring Provider (PT): Billie Ruddy, MD   Encounter Date: 11/15/2019   PT End of Session - 11/16/19 0553    Visit Number 6    Number of Visits 13    Date for PT Re-Evaluation 12/13/19    Authorization Type MEDICARE PART A AND B    Progress Note Due on Visit 10    PT Start Time 1452    PT Stop Time 1546    PT Time Calculation (min) 54 min    Activity Tolerance Patient tolerated treatment well    Behavior During Therapy [facility] for tasks assessed/performed           Past Medical History:  Diagnosis Date  . Asthma   . Blood in stool   . Colon polyps   . Diabetes mellitus without complication ([place])   . Diverticulitis   . Fibromyalgia   . GERD (gastroesophageal reflux disease)   . Gout   . Hypercholesteremia   . Hypertension   . Pneumonia   . Pneumonia   . UTI (urinary tract infection)     Past Surgical History:  Procedure Laterality Date  . APPENDECTOMY    . CESAREAN SECTION     x 3  . LIPOMA EXCISION    . MENISCUS REPAIR Left   . OVARIAN CYST REMOVAL    . OVARIAN CYST SURGERY      There were no vitals filed for this visit.   Subjective Assessment - 11/15/19 1505    Subjective Pt reports she is having more pain today and thinks it is related to the rainy weather. Rates low back pain a 7/10. Pt states her R ankle started bothering her today out of the blue.    Currently in Pain? Yes    Pain Score 0-No pain    Pain Location Neck    Pain Radiating Towards NA    Pain Onset 1 to 4 weeks ago    Pain Frequency Intermittent    Pain Score 7    Pain Location Back    Pain Orientation Posterior;Lower    Pain Descriptors / Indicators Aching;Throbbing    Pain Type Chronic pain    Pain Onset 1 to 4  weeks ago    Pain Frequency Intermittent                             [facility] Adult PT Treatment/Exercise - 11/16/19 0001      Exercises   Exercises Neck;Lumbar      Neck Exercises: Machines for Strengthening   Nustep 5 min;L5;arms and legs      Neck Exercises: Seated   Neck Retraction 5 reps;5 secs    Cervical Rotation Left;Both;5 reps    Cervical Rotation Limitations gentle pt.over pressure    Lateral Flexion Right;Left;5 reps    Lateral Flexion Limitations 5 secs; anchored UE      Lumbar Exercises: Stretches   Active Hamstring Stretch Right;Left;20 seconds;3 reps    Active Hamstring Stretch Limitations seated    Lower Trunk Rotation 3 reps    Lower Trunk Rotation Limitations 5 sec    Piriformis Stretch Right;Left;20 seconds;2 reps      Lumbar Exercises: Supine   Pelvic Tilt 10 reps  Pelvic Tilt Limitations 3 sec    Bent Knee Raise 10 reps    Bent Knee Raise Limitations ab engagement; 2 sets      Moist Heat Therapy   Number Minutes Moist Heat 15 Minutes    Moist Heat Location Cervical;Lumbar Spine                    PT Short Term Goals - 10/25/19 1007      PT SHORT TERM GOAL #1   Title Pt will be Ind c an initial HEP    Baseline in progress    Time 3    Period Weeks    Status New    Target Date 11/15/19      PT SHORT TERM GOAL #2   Title Pt will voice understanding of measure to manage and reduce pain    Baseline Decreased understanding    Time 3    Period Weeks    Status New    Target Date 11/15/19      PT SHORT TERM GOAL #3   Title Complete 5x STS and 2 minute walking tests for measures of functional mobility and set LTGs    Baseline not completed    Time 3    Period Weeks    Status New    Target Date 11/15/19             PT Long Term Goals - 10/25/19 1013      PT LONG TERM GOAL #1   Title Improve cervical and trunk SB ROM by 5 to 10d and rotation 25% or less limitation    Baseline See eval flowsheet for cervical  SB ROMs and rotations were found to be 50% limited    Time 7    Period Weeks    Status New    Target Date 12/13/19      PT LONG TERM GOAL #2   Title Pt will be Ind in a final HEP to improve strength, ROM, pain and functional mobility    Baseline In progress    Time 7    Period Weeks    Status New    Target Date 12/13/19      PT LONG TERM GOAL #3   Title Pt will report improved pain levels to 0-4 with daily activities    Baseline 0-9/10    Time 7    Period Weeks    Status New    Target Date 12/13/19      PT LONG TERM GOAL #4   Title Pt's FOTO score will improve to the predicted limitation of 44%    Baseline 62% limitation    Time 7    Period Weeks    Status New    Target Date 12/13/19                 Plan - 11/16/19 0555    Clinical Impression Statement PT focused on LE and back flexibility and core stability with pt reporting an increase in LBP. Pt mobility with sit to/from supine and turning were more guarded today. Moist heat was provided to the back and neck following ex for pain management.    Personal Factors and Comorbidities Age;Comorbidity 1;Comorbidity 2;Comorbidity 3+    Comorbidities DM, CHF, obesity, fibromyalgia    Examination-Activity Limitations Bed Mobility;Carry;Lift;Stand;Stairs;Squat;Sit;Reach Overhead;Locomotion Level    Stability/Clinical Decision Making Evolving/Moderate complexity    Clinical Decision Making Moderate    Rehab Potential Good    PT Frequency 2x /  week    PT Duration 6 weeks    PT Treatment/Interventions ADLs/Self Care Home Management;Cryotherapy;Electrical Stimulation;Iontophoresis 4mg /ml Dexamethasone;Moist Heat;Traction;Ultrasound;Neuromuscular re-education;Therapeutic exercise;Therapeutic activities;Functional mobility training;Stair training;Gait training;Patient/family education;Manual techniques;Dry needling;Passive range of motion;Taping;Spinal Manipulations;Joint Manipulations    PT Next Visit Plan Assess response to HEP  and massge. Complete 5xSTS and 2 min walking test. Progress cervicothoracic and core/lumbar strengthening. Consider dry needling. Provide manual therapy and modalities as needed.    PT Home Exercise Plan Access Code: [email or web address].    Consulted and Agree with Plan of Care Patient           Patient will benefit from skilled therapeutic intervention in order to improve the following deficits and impairments:  Difficulty walking, Decreased range of motion, Increased muscle spasms, Impaired UE functional use, Obesity, Decreased activity tolerance, Pain, Impaired flexibility, Decreased mobility, Decreased strength, Postural dysfunction  Visit Diagnosis: Cervical pain  Mid-back pain, acute  Acute midline low back pain without sciatica  Decreased ROM of neck  Decreased ROM of trunk and back     Problem List Patient Active Problem List   Diagnosis Date Noted  . CHF (congestive heart failure) ([place]) 12/10/2018  . History of recurrent UTIs 06/30/2018  . Mild intermittent asthma without complication 09/38/1829  . Prediabetes 06/30/2018  . Environmental and seasonal allergies 06/30/2018  . Vitamin D deficiency 08/07/2016  . Morbid obesity with BMI of 40.0-44.9, adult ([place]) 02/18/2016  . Extrinsic asthma without complication 93/71/6967  . SOB (shortness of breath) 06/19/2015  . S/P carpal tunnel release 07/24/2014  . S/P cubital tunnel release 07/24/2014  . Cubital tunnel syndrome, right 07/06/2014  . Right carpal tunnel syndrome 07/06/2014  . Numbness of right hand 05/21/2014  . Hypokalemia 08/16/2013  . Diabetes mellitus type 2, uncomplicated ([place]) 89/38/1017  . GERD (gastroesophageal reflux disease) 08/28/2011  . HTN (hypertension) 08/28/2011    Gar Ponto MS, PT 11/16/19 6:05 AM  [place] [facility] [address] [place], [place], [address] Phone: [phone]   Fax:  [phone]  Name: Beth Macias MRN:  [id number] Date of Birth: March 06, 1951

## 2019-11-17 ENCOUNTER — Ambulatory Visit: Payer: Medicare Other

## 2019-11-17 DIAGNOSIS — M2569 Stiffness of other specified joint, not elsewhere classified: Secondary | ICD-10-CM

## 2019-11-17 DIAGNOSIS — M549 Dorsalgia, unspecified: Secondary | ICD-10-CM

## 2019-11-17 DIAGNOSIS — M545 Low back pain, unspecified: Secondary | ICD-10-CM

## 2019-11-17 DIAGNOSIS — M542 Cervicalgia: Secondary | ICD-10-CM

## 2019-11-17 DIAGNOSIS — R29898 Other symptoms and signs involving the musculoskeletal system: Secondary | ICD-10-CM

## 2019-11-20 NOTE — Therapy (Signed)
[place], [place], [address] Phone: [phone]   Fax:  [phone]  Physical Therapy Treatment  Patient Details  Name: Beth Macias MRN: [id number] Date of Birth: 27-Aug-1950 Referring Provider (PT): Billie Ruddy, MD   Encounter Date: 11/17/2019   PT End of Session - 11/20/19 1535    Visit Number 7    Number of Visits 13    Date for PT Re-Evaluation 12/13/19    Authorization Type MEDICARE PART A AND B    Progress Note Due on Visit 10    PT Start Time 1450    PT Stop Time 1536    PT Time Calculation (min) 46 min    Activity Tolerance Patient tolerated treatment well    Behavior During Therapy [facility] for tasks assessed/performed           Past Medical History:  Diagnosis Date  . Asthma   . Blood in stool   . Colon polyps   . Diabetes mellitus without complication ([place])   . Diverticulitis   . Fibromyalgia   . GERD (gastroesophageal reflux disease)   . Gout   . Hypercholesteremia   . Hypertension   . Pneumonia   . Pneumonia   . UTI (urinary tract infection)     Past Surgical History:  Procedure Laterality Date  . APPENDECTOMY    . CESAREAN SECTION     x 3  . LIPOMA EXCISION    . MENISCUS REPAIR Left   . OVARIAN CYST REMOVAL    . OVARIAN CYST SURGERY      There were no vitals filed for this visit.   Subjective Assessment - 11/20/19 1526    Subjective Pt reports she is having a good day, feeling energized. Pt rates her LBP as a 4/10. Denies neck pain.    Currently in Pain? No/denies    Pain Score 0-No pain    Pain Location Neck    Pain Orientation Left;Posterior    Pain Type Acute pain;Chronic pain    Pain Onset 1 to 4 weeks ago    Pain Frequency Intermittent    Pain Score 4    Pain Location Back    Pain Orientation Posterior    Pain Descriptors / Indicators Aching;Throbbing    Pain Type Acute pain;Chronic pain    Pain Frequency Intermittent               [facility] PT Assessment - 11/20/19 0001      Transfers   Five time sit to stand comments  18.1 sec c arms      Ambulation/Gait   Gait Comments 2 min walking Test-340 ft      6 Minute Walk- Baseline   6 Minute Walk- Baseline --                         [facility] Adult PT Treatment/Exercise - 11/20/19 0001      Therapeutic Activites    Therapeutic Activities Other Therapeutic Activities    Other Therapeutic Activities Functional activities of 5x STS and 2 min walking      Exercises   Exercises Neck;Lumbar      Neck Exercises: Machines for Strengthening   Nustep 6 min;L5;arms and legs      Lumbar Exercises: Stretches   Active Hamstring Stretch Right;Left;20 seconds;3 reps    Active Hamstring Stretch Limitations seated    Lower Trunk Rotation 3 reps    Lower  Trunk Rotation Limitations 5 sec    Piriformis Stretch Right;Left;20 seconds;2 reps      Lumbar Exercises: Supine   Pelvic Tilt 10 reps    Pelvic Tilt Limitations 3 sec    Bent Knee Raise 10 reps    Bent Knee Raise Limitations ab engagement; 2 sets                    PT Short Term Goals - 10/25/19 1007      PT SHORT TERM GOAL #1   Title Pt will be Ind c an initial HEP    Baseline in progress    Time 3    Period Weeks    Status New    Target Date 11/15/19      PT SHORT TERM GOAL #2   Title Pt will voice understanding of measure to manage and reduce pain    Baseline Decreased understanding    Time 3    Period Weeks    Status New    Target Date 11/15/19      PT SHORT TERM GOAL #3   Title Complete 5x STS and 2 minute walking tests for measures of functional mobility and set LTGs    Baseline not completed    Time 3    Period Weeks    Status New    Target Date 11/15/19             PT Long Term Goals - 11/20/19 1549      PT LONG TERM GOAL #5   Title Improve 5x STS by 3 sec    Baseline 18.1    Status New    Target Date 12/13/19      Additional Long Term Goals    Additional Long Term Goals Yes      PT LONG TERM GOAL #6   Title Improve 2 min walking test by 33ft    Baseline 340 ft    Status New    Target Date 12/13/19                 Plan - 11/20/19 1556    Clinical Impression Statement Assessed and completed functional activities for the 5x STS and 2 min walk. Completed core flexibility and strengthening exs. Pt's subjective reports indicates improve in her pain levels.    Personal Factors and Comorbidities Age;Comorbidity 1;Comorbidity 2;Comorbidity 3+    Comorbidities DM, CHF, obesity, fibromyalgia    Examination-Activity Limitations Bed Mobility;Carry;Lift;Stand;Stairs;Squat;Sit;Reach Overhead;Locomotion Level    Stability/Clinical Decision Making Evolving/Moderate complexity    Clinical Decision Making Moderate    Rehab Potential Good    PT Frequency 2x / week    PT Duration 6 weeks    PT Treatment/Interventions ADLs/Self Care Home Management;Cryotherapy;Electrical Stimulation;Iontophoresis 4mg /ml Dexamethasone;Moist Heat;Traction;Ultrasound;Neuromuscular re-education;Therapeutic exercise;Therapeutic activities;Functional mobility training;Stair training;Gait training;Patient/family education;Manual techniques;Dry needling;Passive range of motion;Taping;Spinal Manipulations;Joint Manipulations    PT Next Visit Plan Progress cervicothoracic and core/lumbar strengthening. Provide manual therapy and modalities as needed.    PT Home Exercise Plan Access Code: [email or web address].    Consulted and Agree with Plan of Care Patient           Patient will benefit from skilled therapeutic intervention in order to improve the following deficits and impairments:  Difficulty walking, Decreased range of motion, Increased muscle spasms, Impaired UE functional use, Obesity, Decreased activity tolerance, Pain, Impaired flexibility, Decreased mobility, Decreased strength, Postural dysfunction  Visit Diagnosis: Cervical pain  Mid-back pain, acute  Acute  midline low back pain without sciatica  Decreased ROM of neck  Decreased ROM of trunk and back     Problem List Patient Active Problem List   Diagnosis Date Noted  . CHF (congestive heart failure) ([place]) 12/10/2018  . History of recurrent UTIs 06/30/2018  . Mild intermittent asthma without complication 34/96/1164  . Prediabetes 06/30/2018  . Environmental and seasonal allergies 06/30/2018  . Diabetes ([place]) 04/04/2018  . Upper respiratory infection, viral 04/04/2018  . Urinary tract infection 04/04/2018  . Vitamin D deficiency 08/07/2016  . Morbid obesity with BMI of 40.0-44.9, adult ([place]) 02/18/2016  . Extrinsic asthma without complication 35/39/1225  . SOB (shortness of breath) 06/19/2015  . S/P carpal tunnel release 07/24/2014  . S/P cubital tunnel release 07/24/2014  . Cubital tunnel syndrome, right 07/06/2014  . Right carpal tunnel syndrome 07/06/2014  . Numbness of right hand 05/21/2014  . Hypokalemia 08/16/2013  . Diabetes mellitus type 2, uncomplicated ([place]) 83/46/2194  . GERD (gastroesophageal reflux disease) 08/28/2011  . HTN (hypertension) 08/28/2011    Gar Ponto MS, PT 11/20/19 4:01 PM  [place] [facility] [address] [place], [place], [address] Phone: [phone]   Fax:  [phone]  Name: Beth Macias MRN: [id number] Date of Birth: 1951/02/20

## 2019-11-22 ENCOUNTER — Ambulatory Visit: Payer: Medicare Other

## 2019-11-22 ENCOUNTER — Other Ambulatory Visit: Payer: Self-pay

## 2019-11-22 DIAGNOSIS — M542 Cervicalgia: Secondary | ICD-10-CM

## 2019-11-22 DIAGNOSIS — M549 Dorsalgia, unspecified: Secondary | ICD-10-CM

## 2019-11-22 DIAGNOSIS — M545 Low back pain, unspecified: Secondary | ICD-10-CM

## 2019-11-22 DIAGNOSIS — R29898 Other symptoms and signs involving the musculoskeletal system: Secondary | ICD-10-CM

## 2019-11-22 DIAGNOSIS — M2569 Stiffness of other specified joint, not elsewhere classified: Secondary | ICD-10-CM

## 2019-11-22 NOTE — Therapy (Signed)
[facility] [address] [place], [place], [address] Phone: [phone]   Fax:  [phone]  Physical Therapy Treatment  Patient Details  Name: Beth Macias MRN: [id number] Date of Birth: 1950/08/25 Referring Provider (PT): Deeann Saint, MD   Encounter Date: 11/22/2019   PT End of Session - 11/22/19 1831    Visit Number 8    Number of Visits 13    Date for PT Re-Evaluation 12/13/19    Authorization Type MEDICARE PART A AND B    Progress Note Due on Visit 10    PT Start Time 1445    PT Stop Time 1546    PT Time Calculation (min) 61 min    Activity Tolerance Patient tolerated treatment well    Behavior During Therapy [facility] for tasks assessed/performed           Past Medical History:  Diagnosis Date  . Asthma   . Blood in stool   . Colon polyps   . Diabetes mellitus without complication (HCC)   . Diverticulitis   . Fibromyalgia   . GERD (gastroesophageal reflux disease)   . Gout   . Hypercholesteremia   . Hypertension   . Pneumonia   . Pneumonia   . UTI (urinary tract infection)     Past Surgical History:  Procedure Laterality Date  . APPENDECTOMY    . CESAREAN SECTION     x 3  . LIPOMA EXCISION    . MENISCUS REPAIR Left   . OVARIAN CYST REMOVAL    . OVARIAN CYST SURGERY      There were no vitals filed for this visit.   Subjective Assessment - 11/22/19 1453    Subjective Pt reports some L shoulder and low back soreness. Overall, I'm doing better.    Pain Score 3     Pain Location Neck    Pain Orientation Left    Pain Descriptors / Indicators Sore    Pain Type Chronic pain    Pain Onset 1 to 4 weeks ago    Pain Frequency Intermittent    Pain Score 4    Pain Location Back    Pain Orientation Posterior;Lower    Pain Descriptors / Indicators Discomfort    Pain Type Chronic pain    Pain Onset 1 to 4 weeks ago    Pain Frequency Intermittent              [facility] PT Assessment -  11/22/19 0001      Observation/Other Assessments   Focus on Therapeutic Outcomes (FOTO)  41%                         [facility] Adult PT Treatment/Exercise - 11/22/19 0001      Self-Care   Self-Care Other Self-Care Comments    Other Self-Care Comments  Use of massage cane for trigger point massage       Exercises   Exercises Neck;Lumbar      Neck Exercises: Machines for Strengthening   Nustep 6 min;L5;arms and legs      Neck Exercises: Seated   Neck Retraction 5 reps;3 secs    Cervical Rotation Right;Left;5 reps    Cervical Rotation Limitations 5 ec; gentle overpressure    Lateral Flexion Left;Both;5 reps    Lateral Flexion Limitations 5 sec; UE anchor      Shoulder Exercises: Standing   Extension Strengthening;Both;10 reps    Theraband Level (Shoulder Extension) Level  3 (Green)    Extension Limitations 2 sets    Retraction Strengthening;Both;10 reps;Theraband    Theraband Level (Shoulder Retraction) Level 3 (Green)    Retraction Limitations 2 sets      Modalities   Modalities Moist Heat      Moist Heat Therapy   Number Minutes Moist Heat 15 Minutes    Moist Heat Location Cervical;Lumbar Spine      Manual Therapy   Manual Therapy Soft tissue mobilization    Soft tissue mobilization STW c trigger point release bilat cervical paraspinal and upper traps                    PT Short Term Goals - 11/22/19 1839      PT SHORT TERM GOAL #1   Title Pt will be Ind c an initial HEP. Met    Period Weeks    Status Achieved      PT SHORT TERM GOAL #2   Title Pt will voice understanding of measure to manage and reduce pain. Met- Pt is using HEP and heating pad for pain management    Status Achieved      PT SHORT TERM GOAL #3   Title Complete 5x STS and 2 minute walking tests for measures of functional mobility and set LTGs. Met    Status Achieved             PT Long Term Goals - 11/22/19 1841      PT LONG TERM GOAL #4   Title Pt's FOTO score  will improve to the predicted limitation of 44%. On going 41%    Baseline 62% limitation    Status On-going    Target Date 12/13/19                 Plan - 11/22/19 1834    Clinical Impression Statement PT focted of the l upper traps.. [place] on cervical/upper shoulder pain and tightness with postural flexibility, and strengthening exs completed. STW was also completed with increase muscle tension noted. Education for use of massage cane for self massage provided. FOTO re-assess indicates good progress c pts perceived level of function.s p    Personal Factors and Comorbidities Age;Comorbidity 1;Comorbidity 2;Comorbidity 3+    Comorbidities DM, CHF, obesity, fibromyalgia    Examination-Activity Limitations Bed Mobility;Carry;Lift;Stand;Stairs;Squat;Sit;Reach Overhead;Locomotion Level    Stability/Clinical Decision Making Evolving/Moderate complexity    Clinical Decision Making Moderate    Rehab Potential Good    PT Frequency 2x / week    PT Duration 6 weeks    PT Treatment/Interventions ADLs/Self Care Home Management;Cryotherapy;Electrical Stimulation;Iontophoresis 57m/ml Dexamethasone;Moist Heat;Traction;Ultrasound;Neuromuscular re-education;Therapeutic exercise;Therapeutic activities;Functional mobility training;Stair training;Gait training;Patient/family education;Manual techniques;Dry needling;Passive range of motion;Taping;Spinal Manipulations;Joint Manipulations    PT Next Visit Plan Progress cervicothoracic and core/lumbar strengthening. Provide manual therapy and modalities as needed.    PT Home Exercise Plan Access Code: [email or web address].    Consulted and Agree with Plan of Care Patient           Patient will benefit from skilled therapeutic intervention in order to improve the following deficits and impairments:  Difficulty walking, Decreased range of motion, Increased muscle spasms, Impaired UE functional use, Obesity, Decreased activity tolerance, Pain, Impaired flexibility, Decreased  mobility, Decreased strength, Postural dysfunction  Visit Diagnosis: Cervical pain  Mid-back pain, acute  Acute midline low back pain without sciatica  Decreased ROM of neck  Decreased ROM of trunk and back     Problem List Patient Active Problem List  Diagnosis Date Noted  . CHF (congestive heart failure) ([place]) 12/10/2018  . History of recurrent UTIs 06/30/2018  . Mild intermittent asthma without complication 38/88/2800  . Prediabetes 06/30/2018  . Environmental and seasonal allergies 06/30/2018  . Diabetes ([place]) 04/04/2018  . Upper respiratory infection, viral 04/04/2018  . Urinary tract infection 04/04/2018  . Vitamin D deficiency 08/07/2016  . Morbid obesity with BMI of 40.0-44.9, adult ([place]) 02/18/2016  . Extrinsic asthma without complication 34/91/7915  . SOB (shortness of breath) 06/19/2015  . S/P carpal tunnel release 07/24/2014  . S/P cubital tunnel release 07/24/2014  . Cubital tunnel syndrome, right 07/06/2014  . Right carpal tunnel syndrome 07/06/2014  . Numbness of right hand 05/21/2014  . Hypokalemia 08/16/2013  . Diabetes mellitus type 2, uncomplicated ([place]) 05/69/7948  . GERD (gastroesophageal reflux disease) 08/28/2011  . HTN (hypertension) 08/28/2011    Gar Ponto MS, PT 11/22/19 6:45 PM  [facility] [address] [place], [place], [address] Phone: [phone]   Fax:  [phone]  Name: Jari Carollo MRN: [id number] Date of Birth: [birth year]

## 2019-11-23 LAB — HM DIABETES EYE EXAM

## 2019-11-24 ENCOUNTER — Ambulatory Visit: Payer: Medicare Other | Attending: Family Medicine

## 2019-11-24 ENCOUNTER — Other Ambulatory Visit: Payer: Self-pay

## 2019-11-24 DIAGNOSIS — R29898 Other symptoms and signs involving the musculoskeletal system: Secondary | ICD-10-CM | POA: Diagnosis present

## 2019-11-24 DIAGNOSIS — M2560 Stiffness of unspecified joint, not elsewhere classified: Secondary | ICD-10-CM | POA: Diagnosis present

## 2019-11-24 DIAGNOSIS — M542 Cervicalgia: Secondary | ICD-10-CM | POA: Diagnosis not present

## 2019-11-24 DIAGNOSIS — M545 Low back pain, unspecified: Secondary | ICD-10-CM

## 2019-11-24 DIAGNOSIS — M549 Dorsalgia, unspecified: Secondary | ICD-10-CM

## 2019-11-24 DIAGNOSIS — M2569 Stiffness of other specified joint, not elsewhere classified: Secondary | ICD-10-CM

## 2019-11-28 NOTE — Therapy (Signed)
[place], [place], [address] Phone: [phone]   Fax:  [phone]  Physical Therapy Treatment  Patient Details  Name: Beth Macias MRN: [id number] Date of Birth: [birth year] Referring Provider (PT): Billie Ruddy, MD   Encounter Date: 11/24/2019   PT End of Session - 11/28/19 1621    Visit Number 9    Number of Visits 13    Date for PT Re-Evaluation 12/13/19    Authorization Type MEDICARE PART A AND B    PT Start Time 1452    PT Stop Time 1550    PT Time Calculation (min) 58 min    Activity Tolerance Patient tolerated treatment well    Behavior During Therapy [facility] for tasks assessed/performed           Past Medical History:  Diagnosis Date  . Asthma   . Blood in stool   . Colon polyps   . Diabetes mellitus without complication ([place])   . Diverticulitis   . Fibromyalgia   . GERD (gastroesophageal reflux disease)   . Gout   . Hypercholesteremia   . Hypertension   . Pneumonia   . Pneumonia   . UTI (urinary tract infection)     Past Surgical History:  Procedure Laterality Date  . APPENDECTOMY    . CESAREAN SECTION     x 3  . LIPOMA EXCISION    . MENISCUS REPAIR Left   . OVARIAN CYST REMOVAL    . OVARIAN CYST SURGERY      There were no vitals filed for this visit.   Subjective Assessment - 11/28/19 1617    Subjective Pt reports she is almost at her pre-accident back pain level. rates 3/10. For her neck she rates 0/10 currently.    Currently in Pain? No/denies    Pain Score 0-No pain    Pain Location Neck    Pain Type Chronic pain    Pain Radiating Towards NA    Pain Onset 1 to 4 weeks ago    Pain Frequency Intermittent    Pain Score 3    Pain Location Back    Pain Orientation Posterior;Lower    Pain Descriptors / Indicators Discomfort    Pain Type Chronic pain    Pain Onset 1 to 4 weeks ago    Pain Frequency Intermittent                              [facility] Adult PT Treatment/Exercise - 11/28/19 0001      Self-Care   Self-Care Other Self-Care Comments    Other Self-Care Comments  Most proper technique for ther ex      Neck Exercises: Machines for Strengthening   Nustep 8 min;L5;arms and legs      Lumbar Exercises: Stretches   Active Hamstring Stretch Right;Left;20 seconds;3 reps    Active Hamstring Stretch Limitations seated    Lower Trunk Rotation 3 reps    Lower Trunk Rotation Limitations 5 sec    Piriformis Stretch Right;Left;20 seconds;2 reps      Lumbar Exercises: Supine   Pelvic Tilt 10 reps    Pelvic Tilt Limitations 3 sec    Bent Knee Raise 10 reps    Bent Knee Raise Limitations ab engagement; 2 sets      Shoulder Exercises: Standing   Extension Strengthening;Both;10 reps    Theraband Level (Shoulder Extension) Level 3 (Green)  Extension Limitations 2 sets    Retraction Strengthening;Both;10 reps;Theraband    Theraband Level (Shoulder Retraction) Level 3 (Green)    Retraction Limitations 2 sets      Modalities   Modalities Moist Heat      Moist Heat Therapy   Number Minutes Moist Heat 15 Minutes    Moist Heat Location Cervical;Lumbar Spine                    PT Short Term Goals - 11/22/19 1839      PT SHORT TERM GOAL #1   Title Pt will be Ind c an initial HEP. Met    Period Weeks    Status Achieved      PT SHORT TERM GOAL #2   Title Pt will voice understanding of measure to manage and reduce pain. Met- Pt is using HEP and heating pad for pain management    Status Achieved      PT SHORT TERM GOAL #3   Title Complete 5x STS and 2 minute walking tests for measures of functional mobility and set LTGs. Met    Status Achieved             PT Long Term Goals - 11/22/19 1841      PT LONG TERM GOAL #4   Title Pt's FOTO score will improve to the predicted limitation of 44%. On going 41%    Baseline 62% limitation    Status On-going    Target Date  12/13/19                 Plan - 11/28/19 1623    Clinical Impression Statement Pt participated in PT well demonstrating fluid body movemennts with ther ex, STS, and sit to/fom supine. Subjective report relects improvement as noted with pt's physical movements. Anticipate pt is moving toward DC per the set vist number.    Personal Factors and Comorbidities Age;Comorbidity 1;Comorbidity 2;Comorbidity 3+    Comorbidities DM, CHF, obesity, fibromyalgia    Examination-Activity Limitations Bed Mobility;Carry;Lift;Stand;Stairs;Squat;Sit;Reach Overhead;Locomotion Level    Stability/Clinical Decision Making Evolving/Moderate complexity    Rehab Potential Good    PT Duration 6 weeks    PT Treatment/Interventions ADLs/Self Care Home Management;Cryotherapy;Electrical Stimulation;Iontophoresis 65m/ml Dexamethasone;Moist Heat;Traction;Ultrasound;Neuromuscular re-education;Therapeutic exercise;Therapeutic activities;Functional mobility training;Stair training;Gait training;Patient/family education;Manual techniques;Dry needling;Passive range of motion;Taping;Spinal Manipulations;Joint Manipulations    PT Next Visit Plan Progress cervicothoracic and core/lumbar strengthening exs as indicated. Provide manual therapy and modalities as needed.    PT Home Exercise Plan Access Code: [email or web address].    Consulted and Agree with Plan of Care Patient           Patient will benefit from skilled therapeutic intervention in order to improve the following deficits and impairments:  Difficulty walking, Decreased range of motion, Increased muscle spasms, Impaired UE functional use, Obesity, Decreased activity tolerance, Pain, Impaired flexibility, Decreased mobility, Decreased strength, Postural dysfunction  Visit Diagnosis: Cervical pain  Mid-back pain, acute  Acute midline low back pain without sciatica  Decreased ROM of neck  Decreased ROM of trunk and back     Problem List Patient Active Problem List    Diagnosis Date Noted  . CHF (congestive heart failure) ([place] 12/10/2018  . History of recurrent UTIs 06/30/2018  . Mild intermittent asthma without complication 041/93/7902 . Prediabetes 06/30/2018  . Environmental and seasonal allergies 06/30/2018  . Diabetes ([place] 04/04/2018  . Upper respiratory infection, viral 04/04/2018  . Urinary tract infection 04/04/2018  . Vitamin D deficiency 08/07/2016  .  Morbid obesity with BMI of 40.0-44.9, adult ([place]) 02/18/2016  . Extrinsic asthma without complication 64/15/8309  . SOB (shortness of breath) 06/19/2015  . S/P carpal tunnel release 07/24/2014  . S/P cubital tunnel release 07/24/2014  . Cubital tunnel syndrome, right 07/06/2014  . Right carpal tunnel syndrome 07/06/2014  . Numbness of right hand 05/21/2014  . Hypokalemia 08/16/2013  . Diabetes mellitus type 2, uncomplicated ([place]) 40/76/8088  . GERD (gastroesophageal reflux disease) 08/28/2011  . HTN (hypertension) 08/28/2011    Gar Ponto MS, PT 11/28/19 4:41 PM  [facility] [address] [place], [place], [address] Phone: [phone]   Fax:  [phone]  Name: Beth Macias MRN: [id number] Date of Birth: 08/27/1950

## 2019-12-02 ENCOUNTER — Other Ambulatory Visit: Payer: Self-pay

## 2019-12-02 DIAGNOSIS — R053 Chronic cough: Secondary | ICD-10-CM

## 2019-12-02 DIAGNOSIS — J4540 Moderate persistent asthma, uncomplicated: Secondary | ICD-10-CM

## 2019-12-02 DIAGNOSIS — R058 Other specified cough: Secondary | ICD-10-CM

## 2019-12-02 MED ORDER — MONTELUKAST SODIUM 10 MG PO TABS: 10.0000 mg | ORAL_TABLET | Freq: Every day | ORAL | 0 refills | Status: DC

## 2019-12-06 ENCOUNTER — Ambulatory Visit: Payer: Medicare Other

## 2019-12-08 ENCOUNTER — Other Ambulatory Visit: Payer: Self-pay | Admitting: Pulmonary Disease

## 2019-12-08 ENCOUNTER — Other Ambulatory Visit: Payer: Self-pay

## 2019-12-08 ENCOUNTER — Ambulatory Visit: Payer: Medicare Other

## 2019-12-08 DIAGNOSIS — R058 Other specified cough: Secondary | ICD-10-CM

## 2019-12-08 DIAGNOSIS — R29898 Other symptoms and signs involving the musculoskeletal system: Secondary | ICD-10-CM

## 2019-12-08 DIAGNOSIS — J4540 Moderate persistent asthma, uncomplicated: Secondary | ICD-10-CM

## 2019-12-08 DIAGNOSIS — M542 Cervicalgia: Secondary | ICD-10-CM

## 2019-12-08 DIAGNOSIS — R053 Chronic cough: Secondary | ICD-10-CM

## 2019-12-08 DIAGNOSIS — M545 Low back pain, unspecified: Secondary | ICD-10-CM

## 2019-12-08 DIAGNOSIS — M549 Dorsalgia, unspecified: Secondary | ICD-10-CM

## 2019-12-08 DIAGNOSIS — M2569 Stiffness of other specified joint, not elsewhere classified: Secondary | ICD-10-CM

## 2019-12-08 MED ORDER — MONTELUKAST SODIUM 10 MG PO TABS: 10.0000 mg | ORAL_TABLET | Freq: Every day | ORAL | 2 refills | Status: DC

## 2019-12-11 NOTE — Therapy (Signed)
[place], [place], [address] Phone: [phone]   Fax:  [phone]  Physical Therapy Treatment/Progress Note Reporting Period 10/21/19 to 12/08/19  See note below for Objective Data and Assessment of Progress/Goals.       Patient Details  Name: Beth Macias MRN: [id number] Date of Birth: 1950/12/12 Referring Provider (PT): Billie Ruddy, MD   Encounter Date: 12/08/2019   PT End of Session - 12/11/19 1525    Visit Number 10    Number of Visits 13    Date for PT Re-Evaluation 12/13/19    Authorization Type MEDICARE PART A AND B    Progress Note Due on Visit 10    PT Start Time 1450    PT Stop Time 1534    PT Time Calculation (min) 44 min    Activity Tolerance Patient tolerated treatment well    Behavior During Therapy [facility] for tasks assessed/performed           Past Medical History:  Diagnosis Date  . Asthma   . Blood in stool   . Colon polyps   . Diabetes mellitus without complication ([place])   . Diverticulitis   . Fibromyalgia   . GERD (gastroesophageal reflux disease)   . Gout   . Hypercholesteremia   . Hypertension   . Pneumonia   . Pneumonia   . UTI (urinary tract infection)     Past Surgical History:  Procedure Laterality Date  . APPENDECTOMY    . CESAREAN SECTION     x 3  . LIPOMA EXCISION    . MENISCUS REPAIR Left   . OVARIAN CYST REMOVAL    . OVARIAN CYST SURGERY      There were no vitals filed for this visit.   Subjective Assessment - 12/11/19 1520    Subjective Pt reports she has been doing good and has not had any days with significant pain. I have a few aches and pains, about like normal, but i'm doing doing much better. Pt states using a pillow with proper thickness has helped her neck.  Discussed completion of PT program and pt requested to she how she does over the next week, and it she continues to do well , DC at that time.    Pertinent  History Pt reports PT for rotator cuff deterioration 1.5 years ago; Hx of carpal and cubital tunnel releases    Currently in Pain? No/denies    Pain Score 0-No pain    Pain Location Neck    Pain Orientation Posterior    Pain Descriptors / Indicators Sore    Pain Type Chronic pain    Pain Onset 1 to 4 weeks ago    Pain Frequency Occasional    Aggravating Factors  Certain movements    Pain Relieving Factors Rest and heat    Effect of Pain on Daily Activities none to min    Pain Score 0    Pain Location Back    Pain Orientation Posterior;Lower    Pain Descriptors / Indicators Discomfort    Pain Type Chronic pain    Pain Onset 1 to 4 weeks ago    Pain Frequency Occasional    Aggravating Factors  ceratin back movements    Pain Relieving Factors Rest, heat    Effect of Pain on Daily Activities None to min              [facility] PT Assessment - 12/11/19 0001  AROM   Cervical - Right Side Bend 37    Cervical - Left Side Bend 35    Lumbar - Right Side Bend 50    Lumbar - Left Side Bend 48    Lumbar - Right Rotation --   25% or less limited   Lumbar - Left Rotation --   25% or less limited                        [facility] Adult PT Treatment/Exercise - 12/11/19 0001      Neck Exercises: Machines for Strengthening   Nustep 8 mins; L5 ;UE/LEs      Lumbar Exercises: Stretches   Active Hamstring Stretch Right;Left;20 seconds;3 reps    Active Hamstring Stretch Limitations seated    Lower Trunk Rotation 3 reps    Lower Trunk Rotation Limitations 5 sec    Piriformis Stretch Right;Left;20 seconds;2 reps      Shoulder Exercises: Standing   Extension Strengthening;Both;15 reps    Theraband Level (Shoulder Extension) Level 3 (Green)    Extension Limitations 2 sets    Retraction Strengthening;Both;Theraband;15 reps    Theraband Level (Shoulder Retraction) Level 3 (Green)    Retraction Limitations 2 sets    Other Standing Exercises Palloff 10x each side      Neck  Exercises: Stretches   Upper Trapezius Stretch Right;Left;2 reps;10 seconds    Levator Stretch Right;Left;2 reps;10 seconds    Other Neck Stretches Cervical retraction 5x; 3 sec                  PT Education - 12/11/19 1530    Education Details HEP: Cervical rotation ROM and palloff trunk strengthening exs were added    Person(s) Educated Patient    Methods Explanation;Demonstration;Tactile cues;Verbal cues;Handout    Comprehension Verbalized understanding;Returned demonstration;Verbal cues required;Tactile cues required;Need further instruction            PT Short Term Goals - 11/22/19 1839      PT SHORT TERM GOAL #1   Title Pt will be Ind c an initial HEP. Met    Period Weeks    Status Achieved      PT SHORT TERM GOAL #2   Title Pt will voice understanding of measure to manage and reduce pain. Met- Pt is using HEP and heating pad for pain management    Status Achieved      PT SHORT TERM GOAL #3   Title Complete 5x STS and 2 minute walking tests for measures of functional mobility and set LTGs. Met    Status Achieved             PT Long Term Goals - 12/11/19 1533      PT LONG TERM GOAL #1   Title Improve cervical and trunk SB ROM by 5 to 10d and rotation 25% or less limitation. Met- trunk rotation 25% or less; trunk L SB 48d R 50d; cervical SB R 37d L 35d.    Baseline See eval flowsheet for cervical SB ROMs and rotations were found to be 50% limited    Status Achieved      PT LONG TERM GOAL #3   Title Pt will report improved pain levels to 0-4 with daily activities. Met- 0-3/10    Baseline 0-9/10    Status Achieved                 Plan - 12/11/19 1550    Clinical Impression Statement Pt  demonstrates good progress both with her subjective pain report and objective measures re: cervical and trunk ROM. pt is completing her ther ex/HEP properly.    Personal Factors and Comorbidities Age;Comorbidity 1;Comorbidity 2;Comorbidity 3+    Comorbidities DM,  CHF, obesity, fibromyalgia    Examination-Activity Limitations Bed Mobility;Carry;Lift;Stand;Stairs;Squat;Sit;Reach Overhead;Locomotion Level    Stability/Clinical Decision Making Evolving/Moderate complexity    Clinical Decision Making Moderate    Rehab Potential Good    PT Frequency 2x / week    PT Duration 6 weeks    PT Treatment/Interventions ADLs/Self Care Home Management;Cryotherapy;Electrical Stimulation;Iontophoresis 62m/ml Dexamethasone;Moist Heat;Traction;Ultrasound;Neuromuscular re-education;Therapeutic exercise;Therapeutic activities;Functional mobility training;Stair training;Gait training;Patient/family education;Manual techniques;Dry needling;Passive range of motion;Taping;Spinal Manipulations;Joint Manipulations    PT Next Visit Plan assess response to palloff ex. assess functional measures and FOTO.  if pt continues to be doing well re: her pain levels and anagement of, then anticipate DC from PT services    PT Home Exercise Plan Access Code: [email or web address]. Cerical rotation ROM and palloff exs added    Consulted and Agree with Plan of Care Patient           Patient will benefit from skilled therapeutic intervention in order to improve the following deficits and impairments:  Difficulty walking, Decreased range of motion, Increased muscle spasms, Impaired UE functional use, Obesity, Decreased activity tolerance, Pain, Impaired flexibility, Decreased mobility, Decreased strength, Postural dysfunction  Visit Diagnosis: Cervical pain  Mid-back pain, acute  Acute midline low back pain without sciatica  Decreased ROM of neck  Decreased ROM of trunk and back     Problem List Patient Active Problem List   Diagnosis Date Noted  . CHF (congestive heart failure) ([place] 12/10/2018  . History of recurrent UTIs 06/30/2018  . Mild intermittent asthma without complication 039/17/9217 . Prediabetes 06/30/2018  . Environmental and seasonal allergies 06/30/2018  . Diabetes ([place]  04/04/2018  . Upper respiratory infection, viral 04/04/2018  . Urinary tract infection 04/04/2018  . Vitamin D deficiency 08/07/2016  . Morbid obesity with BMI of 40.0-44.9, adult ([place] 02/18/2016  . Extrinsic asthma without complication 083/75/4237 . SOB (shortness of breath) 06/19/2015  . S/P carpal tunnel release 07/24/2014  . S/P cubital tunnel release 07/24/2014  . Cubital tunnel syndrome, right 07/06/2014  . Right carpal tunnel syndrome 07/06/2014  . Numbness of right hand 05/21/2014  . Hypokalemia 08/16/2013  . Diabetes mellitus type 2, uncomplicated ([place] 002/30/1720 . GERD (gastroesophageal reflux disease) 08/28/2011  . HTN (hypertension) 08/28/2011    AGar PontoMS, PT 12/11/19 3:56 PM  [facility] [address]Phone: [phone]  Fax:  [phone] Name: PClemma JohnsenMRN: [id number] of Birth: [birth year]

## 2019-12-16 ENCOUNTER — Other Ambulatory Visit: Payer: Self-pay

## 2019-12-16 ENCOUNTER — Ambulatory Visit: Payer: Medicare Other | Admitting: Physical Therapy

## 2019-12-16 ENCOUNTER — Telehealth: Payer: PRIVATE HEALTH INSURANCE | Admitting: Family Medicine

## 2019-12-16 DIAGNOSIS — M545 Low back pain, unspecified: Secondary | ICD-10-CM

## 2019-12-16 DIAGNOSIS — M2560 Stiffness of unspecified joint, not elsewhere classified: Secondary | ICD-10-CM

## 2019-12-16 DIAGNOSIS — M549 Dorsalgia, unspecified: Secondary | ICD-10-CM

## 2019-12-16 DIAGNOSIS — M2569 Stiffness of other specified joint, not elsewhere classified: Secondary | ICD-10-CM

## 2019-12-16 DIAGNOSIS — M542 Cervicalgia: Secondary | ICD-10-CM

## 2019-12-16 DIAGNOSIS — R29898 Other symptoms and signs involving the musculoskeletal system: Secondary | ICD-10-CM

## 2019-12-16 NOTE — Therapy (Signed)
[place], [place], [address] Phone: [phone]   Fax:  [phone]  Physical Therapy Treatment and Discharge  Patient Details  Name: Beth Macias MRN: [id number] Date of Birth: 1950-05-31 Referring Provider (PT): Billie Ruddy, MD   Encounter Date: 12/16/2019   PT End of Session - 12/16/19 0919    Visit Number 11    Number of Visits 13    Date for PT Re-Evaluation 12/13/19    Authorization Type MEDICARE PART A AND B    Progress Note Due on Visit 10    PT Start Time 0920    PT Stop Time 1002    PT Time Calculation (min) 42 min    Activity Tolerance Patient tolerated treatment well    Behavior During Therapy [facility] for tasks assessed/performed           Past Medical History:  Diagnosis Date  . Asthma   . Blood in stool   . Colon polyps   . Diabetes mellitus without complication ([place])   . Diverticulitis   . Fibromyalgia   . GERD (gastroesophageal reflux disease)   . Gout   . Hypercholesteremia   . Hypertension   . Pneumonia   . Pneumonia   . UTI (urinary tract infection)     Past Surgical History:  Procedure Laterality Date  . APPENDECTOMY    . CESAREAN SECTION     x 3  . LIPOMA EXCISION    . MENISCUS REPAIR Left   . OVARIAN CYST REMOVAL    . OVARIAN CYST SURGERY      There were no vitals filed for this visit.   Subjective Assessment - 12/16/19 0922    Subjective Pt states she went to a masseuse on 7/17 for deep tissue massage and helped with some of her aches and pains. Pt reports she feels most of her pain is in her low back.    Pertinent History Pt reports PT for rotator cuff deterioration 1.5 years ago; Hx of carpal and cubital tunnel releases    Pain Onset 1 to 4 weeks ago    Pain Onset 1 to 4 weeks ago                             [facility] Adult PT Treatment/Exercise - 12/16/19 0001      Neck Exercises: Machines for Strengthening    Nustep 8 mins; L5 ;UE/LEs      Lumbar Exercises: Stretches   Single Knee to Chest Stretch 3 reps;20 seconds      Lumbar Exercises: Sidelying   Hip Abduction 10 reps;Right;Left      Lumbar Exercises: Prone   Straight Leg Raise 20 reps    Other Prone Lumbar Exercises hamstring curl 2x10      Manual Therapy   Manual Therapy Soft tissue mobilization;Joint mobilization    Manual therapy comments Bilat glutes, piriformis    Joint Mobilization Grade II to III inferior and lateral hip mobs                  PT Education - 12/16/19 1156    Education Details Discussed D/C plan for cervical & thoracic spine; pt to obtain new order to create new POC for her lumbar spine            PT Short Term Goals - 11/22/19 1839      PT SHORT TERM GOAL #1  Title Pt will be Ind c an initial HEP. Met    Period Weeks    Status Achieved      PT SHORT TERM GOAL #2   Title Pt will voice understanding of measure to manage and reduce pain. Met- Pt is using HEP and heating pad for pain management    Status Achieved      PT SHORT TERM GOAL #3   Title Complete 5x STS and 2 minute walking tests for measures of functional mobility and set LTGs. Met    Status Achieved             PT Long Term Goals - 12/11/19 1533      PT LONG TERM GOAL #1   Title Improve cervical and trunk SB ROM by 5 to 10d and rotation 25% or less limitation. Met- trunk rotation 25% or less; trunk L SB 48d R 50d; cervical SB R 37d L 35d.    Baseline See eval flowsheet for cervical SB ROMs and rotations were found to be 50% limited    Status Achieved      PT LONG TERM GOAL #3   Title Pt will report improved pain levels to 0-4 with daily activities. Met- 0-3/10    Baseline 0-9/10    Status Achieved                 Plan - 12/16/19 1153    Clinical Impression Statement Pt reports no continued issues with cervical and thoracic spine and she is ready to be d/ced for this issues. Pt does state she would like to  continue PT for her lumbar region. Discussed with pt that at this time, all of her goals have been achieved and PT will require new MD order to work on her lumbar spine to create a new POC and new goals. Pt agreeable to this plan. This session focused on providing pt an HEP for her low back/lumbar spine in the mean time before she can obtain a new order. Pt tolerated treatment well. Manual therapy provided for her glute med/min and quadratus lumborum (L>R).    Personal Factors and Comorbidities Age;Comorbidity 1;Comorbidity 2;Comorbidity 3+    Comorbidities DM, CHF, obesity, fibromyalgia    Examination-Activity Limitations Bed Mobility;Carry;Lift;Stand;Stairs;Squat;Sit;Reach Overhead;Locomotion Level    Stability/Clinical Decision Making Evolving/Moderate complexity    Rehab Potential Good    PT Frequency 2x / week    PT Duration 6 weeks    PT Treatment/Interventions ADLs/Self Care Home Management;Cryotherapy;Electrical Stimulation;Iontophoresis 1m/ml Dexamethasone;Moist Heat;Traction;Ultrasound;Neuromuscular re-education;Therapeutic exercise;Therapeutic activities;Functional mobility training;Stair training;Gait training;Patient/family education;Manual techniques;Dry needling;Passive range of motion;Taping;Spinal Manipulations;Joint Manipulations    PT Next Visit Plan assess response to palloff ex. assess functional measures and FOTO.  if pt continues to be doing well re: her pain levels and anagement of, then anticipate DC from PT services    PT Home Exercise Plan Access Code: [email or web address]    Consulted and Agree with Plan of Care Patient           PHYSICAL THERAPY DISCHARGE SUMMARY  Visits from Start of Care: 11  Current functional level related to goals / functional outcomes: See above   Remaining deficits: Low back pain -- pt to get new order from MD to address this issue. No deficits with her cervical and thoracic spine.   Education / Equipment: Provided HEP to begin working on her low  back pain  Plan: Patient agrees to discharge.  Patient goals were partially met. Patient is being discharged due to being pleased  with the current functional level.  ?????         Patient will benefit from skilled therapeutic intervention in order to improve the following deficits and impairments:  Difficulty walking, Decreased range of motion, Increased muscle spasms, Impaired UE functional use, Obesity, Decreased activity tolerance, Pain, Impaired flexibility, Decreased mobility, Decreased strength, Postural dysfunction  Visit Diagnosis: Cervical pain  Mid-back pain, acute  Acute midline low back pain without sciatica  Decreased ROM of neck  Decreased ROM of trunk and back     Problem List Patient Active Problem List   Diagnosis Date Noted  . CHF (congestive heart failure) ([place]) 12/10/2018  . History of recurrent UTIs 06/30/2018  . Mild intermittent asthma without complication 72/53/6644  . Prediabetes 06/30/2018  . Environmental and seasonal allergies 06/30/2018  . Diabetes ([place]) 04/04/2018  . Upper respiratory infection, viral 04/04/2018  . Urinary tract infection 04/04/2018  . Vitamin D deficiency 08/07/2016  . Morbid obesity with BMI of 40.0-44.9, adult ([place]) 02/18/2016  . Extrinsic asthma without complication 03/47/4259  . SOB (shortness of breath) 06/19/2015  . S/P carpal tunnel release 07/24/2014  . S/P cubital tunnel release 07/24/2014  . Cubital tunnel syndrome, right 07/06/2014  . Right carpal tunnel syndrome 07/06/2014  . Numbness of right hand 05/21/2014  . Hypokalemia 08/16/2013  . Diabetes mellitus type 2, uncomplicated ([place]) 56/38/7564  . GERD (gastroesophageal reflux disease) 08/28/2011  . HTN (hypertension) 08/28/2011    [facility] [address] PT, DPT 12/16/2019, 11:59 AM  [facility] [address] [place], [place], [address] Phone: [phone]   Fax:  [phone]  Name: Beth Macias MRN: [id number] Date of Birth: 01-06-1951

## 2019-12-16 NOTE — Patient Instructions (Signed)
Access Code: [email or web address] URL: [email or web address] Date: 12/16/2019 Prepared by: Estill Bamberg April Thurnell Garbe  Exercises Prone Hip Extension - 1 x daily - 7 x weekly - 2 sets - 10 reps Prone Knee Flexion - 1 x daily - 7 x weekly - 2 sets - 10 reps Sidelying Hip Abduction - 1 x daily - 7 x weekly - 2 sets - 10 reps Supine Lower Trunk Rotation - 1 x daily - 7 x weekly - 1 sets - 10 reps - 3 sec hold Hooklying Single Knee to Chest Stretch - 1 x daily - 7 x weekly - 3 sets - 30 sec hold

## 2019-12-21 ENCOUNTER — Encounter: Payer: Self-pay | Admitting: Family Medicine

## 2020-01-09 ENCOUNTER — Other Ambulatory Visit: Payer: Self-pay

## 2020-01-09 ENCOUNTER — Ambulatory Visit: Payer: Medicare Other | Attending: Family Medicine | Admitting: Physical Therapy

## 2020-01-09 DIAGNOSIS — M545 Low back pain, unspecified: Secondary | ICD-10-CM

## 2020-01-09 DIAGNOSIS — M2560 Stiffness of unspecified joint, not elsewhere classified: Secondary | ICD-10-CM | POA: Diagnosis present

## 2020-01-09 DIAGNOSIS — M549 Dorsalgia, unspecified: Secondary | ICD-10-CM | POA: Diagnosis not present

## 2020-01-09 DIAGNOSIS — M6281 Muscle weakness (generalized): Secondary | ICD-10-CM

## 2020-01-09 DIAGNOSIS — M2569 Stiffness of other specified joint, not elsewhere classified: Secondary | ICD-10-CM

## 2020-01-09 NOTE — Therapy (Signed)
[place], [place], [address] Phone: [phone]   Fax:  [phone]  Physical Therapy Evaluation  Patient Details  Beth Macias: Beth Macias MRN: [id number] Date of Birth: 12-Oct-1950 Referring Provider (PT): Billie Ruddy, MD   Encounter Date: 01/09/2020   PT End of Session - 01/09/20 1451    Visit Number 1    Number of Visits 12    Authorization Type MEDICARE PART A AND B    Progress Note Due on Visit 10    PT Start Time 1450    PT Stop Time 1530    PT Time Calculation (min) 40 min    Activity Tolerance Patient tolerated treatment well    Behavior During Therapy [facility] for tasks assessed/performed           Past Medical History:  Diagnosis Date  . Asthma   . Blood in stool   . Colon polyps   . Diabetes mellitus without complication ([place])   . Diverticulitis   . Fibromyalgia   . GERD (gastroesophageal reflux disease)   . Gout   . Hypercholesteremia   . Hypertension   . Pneumonia   . Pneumonia   . UTI (urinary tract infection)     Past Surgical History:  Procedure Laterality Date  . APPENDECTOMY    . CESAREAN SECTION     x 3  . LIPOMA EXCISION    . MENISCUS REPAIR Left   . OVARIAN CYST REMOVAL    . OVARIAN CYST SURGERY      There were no vitals filed for this visit.    Subjective Assessment - 01/09/20 1455    Subjective Pt reports LBP somewhere within buttock area. Pt states she hasn't done the exercises. Pt would like to get the knots out of her back.    Pertinent History Pt reports PT for rotator cuff deterioration 1.5 years ago; Hx of carpal and cubital tunnel releases    Limitations Lifting;Walking;Standing;House hold activities;Writing    Diagnostic tests No post accident diagnostic imaging completed    Patient Stated Goals For my pain to decrease and to be active with greater ease    Currently in Pain? Yes    Pain Score 7     Pain Location Back    Pain  Orientation Mid;Lower;Right    Pain Descriptors / Indicators Tightness;Discomfort;Nagging    Pain Type Chronic pain    Pain Radiating Towards buttock    Pain Onset More than a month ago    Pain Frequency Occasional    Pain Onset 1 to 4 weeks ago    Aggravating Factors  Stairs, getting out of bed, sitting,    Pain Relieving Factors Stretches              [facility] PT Assessment - 01/09/20 0001      Assessment   Medical Diagnosis M54.5 (ICD-10-CM) - Low back pain, unspecified back pain laterality, unspecified chronicity, unspecified whether sciatica present    Referring Provider (PT) Billie Ruddy, MD    Prior Therapy Just finished PT for thoracic spine      Restrictions   Weight Bearing Restrictions No      Balance Screen   Has the patient fallen in the past 6 months No      [place] residence    Living Arrangements Children    Available Help at Discharge Family    Type of [place]  Home Access Level entry    Home Layout Multi-level   3 levels   Alternate Level Stairs-Number of Steps 14 to 20 steps ffrom first to 2nd & 2nd to 3rd floot    Alternate Level Stairs-Rails Left      Prior Function   Level of Independence Independent      Observation/Other Assessments   Focus on Therapeutic Outcomes (FOTO)  60% limitation      AROM   Lumbar Flexion 80%    Lumbar Extension 50%    Lumbar - Right Side Bend 50%    Lumbar - Left Side Bend 40%    Lumbar - Right Rotation --   WFL   Lumbar - Left Rotation --   [facility]     Strength   Right Hip Flexion 4-/5    Right Hip Extension 3+/5    Right Hip External Rotation  4-/5    Right Hip Internal Rotation 4-/5    Right Hip ABduction 3/5    Left Hip Flexion 3+/5    Left Hip Extension 3+/5    Left Hip External Rotation 4-/5    Left Hip Internal Rotation 4-/5    Left Hip ABduction 3/5    Right Knee Flexion 3+/5    Right Knee Extension 4-/5    Left Knee Flexion 3+/5    Left Knee Extension  3+/5      Palpation   Palpation comment TTP QL, piriformis, lumbar paraspinals                      Objective measurements completed on examination: See above findings.                 PT Short Term Goals - 01/09/20 1529      PT SHORT TERM GOAL #1   Title Pt will be independent with initial HEP    Baseline Newly provided    Time 4    Period Weeks    Status New    Target Date 02/06/20      PT SHORT TERM GOAL #2   Title Pt will be independent with self management of trigger points and muscle tightness    Baseline Decreased understanding    Time 4    Period Weeks    Status New    Target Date 02/06/20      PT SHORT TERM GOAL #3   Title Pt will be able to perform 10 squats to demonstrate improved functional strength of bilat LEs    Baseline Unable    Time 4    Period Weeks    Status New    Target Date 02/06/20             PT Long Term Goals - 01/09/20 1531      PT LONG TERM GOAL #1   Title Pt will be able to ascend/descend at least 20 steps with step over step pattern and SBA    Baseline Unable    Time 8    Period Weeks    Status New    Target Date 03/05/20      PT LONG TERM GOAL #2   Title Pt will rate pain to be </=5/10 in her back    Baseline 8/10    Time 8    Period Weeks    Status New    Target Date 03/05/20      PT LONG TERM GOAL #3   Title Pt will be able to lift  25 lbs with good body mechanics for home tasks    Baseline Unable    Time 8    Period Weeks    Status New    Target Date 03/05/20      PT LONG TERM GOAL #4   Title Pt's FOTO will improve to </=39%    Baseline 60% limitation    Time 8    Period Weeks    Status New    Target Date 03/05/20                  Plan - 01/09/20 1520    Clinical Impression Statement Pt is a 69 y/o F presenting back to clinic to address her low back pain. Pt demonstrates decreased lumbar ROM, tight lumbar & hip musculature with trigger points, and decreased hip and LE  strength affecting pt's ability to perform functional tasks at home such as stair climbing. Pt would benefit from therapy to address these deficits and optimize her level of function.    Personal Factors and Comorbidities Age;Comorbidity 1;Comorbidity 2;Comorbidity 3+    Comorbidities DM, CHF, obesity, fibromyalgia    Examination-Activity Limitations Bed Mobility;Carry;Lift;Stand;Stairs;Squat;Sit;Reach Overhead;Locomotion Level;Bend    Examination-Participation Restrictions Cleaning;Yard Work;Laundry;Community Activity    Stability/Clinical Decision Making Evolving/Moderate complexity    Rehab Potential Good    PT Frequency 2x / week    PT Duration 6 weeks    PT Treatment/Interventions ADLs/Self Care Home Management;Cryotherapy;Electrical Stimulation;Iontophoresis 4mg /ml Dexamethasone;Moist Heat;Traction;Ultrasound;Neuromuscular re-education;Therapeutic exercise;Therapeutic activities;Functional mobility training;Stair training;Gait training;Patient/family education;Manual techniques;Dry needling;Passive range of motion;Taping;Spinal Manipulations;Joint Manipulations    PT Next Visit Plan assess response to palloff ex. assess functional measures and FOTO.  if pt continues to be doing well re: her pain levels and anagement of, then anticipate DC from PT services    PT Home Exercise Plan Access Code: [email or web address]    Consulted and Agree with Plan of Care Patient           Patient will benefit from skilled therapeutic intervention in order to improve the following deficits and impairments:  Difficulty walking, Decreased range of motion, Increased muscle spasms, Impaired UE functional use, Obesity, Decreased activity tolerance, Pain, Impaired flexibility, Decreased mobility, Decreased strength, Postural dysfunction  Visit Diagnosis: Mid-back pain, acute  Acute midline low back pain without sciatica  Muscle weakness (generalized)  Decreased ROM of trunk and back     Problem List Patient Active  Problem List   Diagnosis Date Noted  . CHF (congestive heart failure) ([place]) 12/10/2018  . History of recurrent UTIs 06/30/2018  . Mild intermittent asthma without complication 05/28/7251  . Prediabetes 06/30/2018  . Environmental and seasonal allergies 06/30/2018  . Diabetes ([place]) 04/04/2018  . Upper respiratory infection, viral 04/04/2018  . Urinary tract infection 04/04/2018  . Vitamin D deficiency 08/07/2016  . Morbid obesity with BMI of 40.0-44.9, adult ([place]) 02/18/2016  . Extrinsic asthma without complication 66/44/0347  . SOB (shortness of breath) 06/19/2015  . S/P carpal tunnel release 07/24/2014  . S/P cubital tunnel release 07/24/2014  . Cubital tunnel syndrome, right 07/06/2014  . Right carpal tunnel syndrome 07/06/2014  . Numbness of right hand 05/21/2014  . Hypokalemia 08/16/2013  . Diabetes mellitus type 2, uncomplicated ([place]) 42/59/5638  . GERD (gastroesophageal reflux disease) 08/28/2011  . HTN (hypertension) 08/28/2011    Beth Macias Beth Macias 01/09/2020, 5:41 PM  [facility] [address] [place], [place], [address] Phone: [phone]   Fax:  [phone]  Beth Macias: Beth Macias MRN:  [id number] Date of Birth: 1950/07/12

## 2020-01-19 ENCOUNTER — Ambulatory Visit: Payer: Medicare Other

## 2020-01-19 ENCOUNTER — Other Ambulatory Visit: Payer: Self-pay

## 2020-01-19 DIAGNOSIS — M545 Low back pain, unspecified: Secondary | ICD-10-CM

## 2020-01-19 DIAGNOSIS — M549 Dorsalgia, unspecified: Secondary | ICD-10-CM

## 2020-01-19 DIAGNOSIS — M2569 Stiffness of other specified joint, not elsewhere classified: Secondary | ICD-10-CM

## 2020-01-19 DIAGNOSIS — M6281 Muscle weakness (generalized): Secondary | ICD-10-CM

## 2020-01-19 NOTE — Therapy (Signed)
[place], [place], [address] Phone: [phone]   Fax:  [phone]  Physical Therapy Treatment  Patient Details  Name: Marcayla Budge MRN: [id number] Date of Birth: [birth year] Referring Provider (PT): Billie Ruddy, MD   Encounter Date: 01/19/2020   PT End of Session - 01/19/20 1711    Visit Number 2    Number of Visits 12    Date for PT Re-Evaluation 12/13/19    Progress Note Due on Visit 10    PT Start Time 1448    PT Stop Time 1533    PT Time Calculation (min) 45 min    Activity Tolerance Patient tolerated treatment well    Behavior During Therapy [facility] for tasks assessed/performed           Past Medical History:  Diagnosis Date  . Asthma   . Blood in stool   . Colon polyps   . Diabetes mellitus without complication ([place])   . Diverticulitis   . Fibromyalgia   . GERD (gastroesophageal reflux disease)   . Gout   . Hypercholesteremia   . Hypertension   . Pneumonia   . Pneumonia   . UTI (urinary tract infection)     Past Surgical History:  Procedure Laterality Date  . APPENDECTOMY    . CESAREAN SECTION     x 3  . LIPOMA EXCISION    . MENISCUS REPAIR Left   . OVARIAN CYST REMOVAL    . OVARIAN CYST SURGERY      There were no vitals filed for this visit.   Subjective Assessment - 01/19/20 1513    Subjective Pt reports her low back and bilat buttock pain is bothering her more today which she believes is related to the rainy weather today.    Patient Stated Goals For my pain to decrease and to be active with greater ease    Currently in Pain? Yes    Pain Score 7     Pain Location Back   bilat buttock   Pain Orientation Mid;Lower;Right;Left    Pain Descriptors / Indicators Tightness;Discomfort;Nagging    Pain Radiating Towards buttocks    Pain Frequency Occasional                             [facility] Adult PT Treatment/Exercise - 01/19/20 0001        Neck Exercises: Machines for Strengthening   Nustep 5 min; L5; UE/LEs      Lumbar Exercises: Stretches   Active Hamstring Stretch Right;Left;3 reps;30 seconds    Active Hamstring Stretch Limitations seated    Single Knee to Chest Stretch 2 reps;30 seconds    Lower Trunk Rotation 5 reps    Lower Trunk Rotation Limitations 3 sec    Piriformis Stretch Right;Left;2 reps;30 seconds      Lumbar Exercises: Seated   Other Seated Lumbar Exercises Forward bending hands on legs 3x10 sec      Lumbar Exercises: Supine   Pelvic Tilt 10 reps    Pelvic Tilt Limitations 3 sec    Bent Knee Raise 10 reps    Bent Knee Raise Limitations 2 sets                  PT Education - 01/19/20 1711    Education Details HEP    Person(s) Educated Patient    Methods Explanation;Demonstration;Tactile cues;Verbal cues;Handout    Comprehension Verbalized understanding;Returned demonstration;Verbal  cues required;Tactile cues required;Need further instruction            PT Short Term Goals - 01/09/20 1529      PT SHORT TERM GOAL #1   Title Pt will be independent with initial HEP    Baseline Newly provided    Time 4    Period Weeks    Status New    Target Date 02/06/20      PT SHORT TERM GOAL #2   Title Pt will be independent with self management of trigger points and muscle tightness    Baseline Decreased understanding    Time 4    Period Weeks    Status New    Target Date 02/06/20      PT SHORT TERM GOAL #3   Title Pt will be able to perform 10 squats to demonstrate improved functional strength of bilat LEs    Baseline Unable    Time 4    Period Weeks    Status New    Target Date 02/06/20             PT Long Term Goals - 01/09/20 1531      PT LONG TERM GOAL #1   Title Pt will be able to ascend/descend at least 20 steps with step over step pattern and SBA    Baseline Unable    Time 8    Period Weeks    Status New    Target Date 03/05/20      PT LONG TERM GOAL #2    Title Pt will rate pain to be </=5/10 in her back    Baseline 8/10    Time 8    Period Weeks    Status New    Target Date 03/05/20      PT LONG TERM GOAL #3   Title Pt will be able to lift 25 lbs with good body mechanics for home tasks    Baseline Unable    Time 8    Period Weeks    Status New    Target Date 03/05/20      PT LONG TERM GOAL #4   Title Pt's FOTO will improve to </=39%    Baseline 60% limitation    Time 8    Period Weeks    Status New    Target Date 03/05/20                 Plan - 01/19/20 1725    Clinical Impression Statement PT focused low back and LE flexibility and core strengthning to minimize posterior low back extension forces. Pt completed exs with good effort and without increase in pain. Pt completed new exs properly. Pt will benefit from PT to address LE/low back flexibility and core and low back strength to reduce pain and maximize function..    Personal Factors and Comorbidities Age;Comorbidity 1;Comorbidity 2;Comorbidity 3+    Comorbidities DM, CHF, obesity, fibromyalgia    Examination-Activity Limitations Bed Mobility;Carry;Lift;Stand;Stairs;Squat;Sit;Reach Overhead;Locomotion Level;Bend    Examination-Participation Restrictions Cleaning;Yard Work;Laundry;Community Activity    Stability/Clinical Decision Making Evolving/Moderate complexity    Clinical Decision Making Moderate    Rehab Potential Good    PT Duration 6 weeks    PT Treatment/Interventions ADLs/Self Care Home Management;Cryotherapy;Electrical Stimulation;Iontophoresis 4mg /ml Dexamethasone;Moist Heat;Traction;Ultrasound;Neuromuscular re-education;Therapeutic exercise;Therapeutic activities;Functional mobility training;Stair training;Gait training;Patient/family education;Manual techniques;Dry needling;Passive range of motion;Taping;Spinal Manipulations;Joint Manipulations    PT Next Visit Plan Assess response to new HEP exs.    PT Home Exercise Plan Access Code: [email or web address]  Consulted and Agree with Plan of Care Patient           Patient will benefit from skilled therapeutic intervention in order to improve the following deficits and impairments:  Difficulty walking, Decreased range of motion, Increased muscle spasms, Impaired UE functional use, Obesity, Decreased activity tolerance, Pain, Impaired flexibility, Decreased mobility, Decreased strength, Postural dysfunction  Visit Diagnosis: Mid-back pain, acute  Acute midline low back pain without sciatica  Muscle weakness (generalized)  Decreased ROM of trunk and back     Problem List Patient Active Problem List   Diagnosis Date Noted  . CHF (congestive heart failure) ([place]) 12/10/2018  . History of recurrent UTIs 06/30/2018  . Mild intermittent asthma without complication 72/53/6644  . Prediabetes 06/30/2018  . Environmental and seasonal allergies 06/30/2018  . Diabetes ([place]) 04/04/2018  . Upper respiratory infection, viral 04/04/2018  . Urinary tract infection 04/04/2018  . Vitamin D deficiency 08/07/2016  . Morbid obesity with BMI of 40.0-44.9, adult ([place]) 02/18/2016  . Extrinsic asthma without complication 03/47/4259  . SOB (shortness of breath) 06/19/2015  . S/P carpal tunnel release 07/24/2014  . S/P cubital tunnel release 07/24/2014  . Cubital tunnel syndrome, right 07/06/2014  . Right carpal tunnel syndrome 07/06/2014  . Numbness of right hand 05/21/2014  . Hypokalemia 08/16/2013  . Diabetes mellitus type 2, uncomplicated ([place]) 56/38/7564  . GERD (gastroesophageal reflux disease) 08/28/2011  . HTN (hypertension) 08/28/2011   Gar Ponto MS, PT 01/19/20 5:35 PM  [facility] [address] [place], [place], [address] Phone: [phone]   Fax:  [phone]  Name: Yuridiana Formanek MRN: [id number] Date of Birth: 1951/04/11

## 2020-01-20 ENCOUNTER — Ambulatory Visit: Payer: Medicare Other

## 2020-01-20 DIAGNOSIS — M549 Dorsalgia, unspecified: Secondary | ICD-10-CM | POA: Diagnosis not present

## 2020-01-20 DIAGNOSIS — M2569 Stiffness of other specified joint, not elsewhere classified: Secondary | ICD-10-CM

## 2020-01-20 DIAGNOSIS — M6281 Muscle weakness (generalized): Secondary | ICD-10-CM

## 2020-01-20 NOTE — Therapy (Signed)
[place], [place], [address] Phone: [phone]   Fax:  [phone]  Physical Therapy Treatment  Patient Details  Name: Beth Macias MRN: [id number] Date of Birth: 1950/09/01 Referring Provider (PT): Billie Ruddy, MD   Encounter Date: 01/20/2020   PT End of Session - 01/20/20 1350    Visit Number 3    Number of Visits 12    Authorization Type MEDICARE PART A AND B    Progress Note Due on Visit 10    PT Start Time 1045    PT Stop Time 1130    PT Time Calculation (min) 45 min    Activity Tolerance Patient tolerated treatment well    Behavior During Therapy [facility] for tasks assessed/performed           Past Medical History:  Diagnosis Date  . Asthma   . Blood in stool   . Colon polyps   . Diabetes mellitus without complication ([place])   . Diverticulitis   . Fibromyalgia   . GERD (gastroesophageal reflux disease)   . Gout   . Hypercholesteremia   . Hypertension   . Pneumonia   . Pneumonia   . UTI (urinary tract infection)     Past Surgical History:  Procedure Laterality Date  . APPENDECTOMY    . CESAREAN SECTION     x 3  . LIPOMA EXCISION    . MENISCUS REPAIR Left   . OVARIAN CYST REMOVAL    . OVARIAN CYST SURGERY      There were no vitals filed for this visit.   Subjective Assessment - 01/20/20 1059    Subjective Pt reports she is having a good day with improved back pain since yesterday.    Patient Stated Goals For my pain to decrease and to be active with greater ease    Currently in Pain? Yes    Pain Score 2     Pain Location Back    Pain Orientation Right;Left;Mid;Lower    Pain Descriptors / Indicators Tightness;Discomfort;Nagging    Pain Type Chronic pain    Pain Radiating Towards Upper buttock    Pain Onset More than a month ago    Pain Frequency Occasional    Aggravating Factors  Certain movements    Pain Relieving Factors rest, HEP                              [facility] Adult PT Treatment/Exercise - 01/20/20 0001      Exercises   Exercises Lumbar;Knee/Hip      Neck Exercises: Machines for Strengthening   Nustep 5 min; L5; UE/LEs      Lumbar Exercises: Stretches   Active Hamstring Stretch Right;Left;30 seconds;2 reps    Active Hamstring Stretch Limitations seated    Lower Trunk Rotation 5 reps    Lower Trunk Rotation Limitations 3 sec      Lumbar Exercises: Standing   Other Standing Lumbar Exercises Sit to stand 15x      Lumbar Exercises: Seated   Other Seated Lumbar Exercises Forward bending hands on legs 3x10 sec      Lumbar Exercises: Supine   Pelvic Tilt 10 reps    Pelvic Tilt Limitations 3 sec    Bent Knee Raise 10 reps    Bent Knee Raise Limitations 1 sets      Lumbar Exercises: Sidelying   Hip Abduction Right;Left;10 reps  Lumbar Exercises: Prone   Straight Leg Raise 10 reps   each LE   Other Prone Lumbar Exercises hamstring curl 2x10      Shoulder Exercises: Prone   Other Prone Exercises --    Other Prone Exercises --      Shoulder Exercises: Sidelying   ABduction --                  PT Education - 01/20/20 1349    Education Details HEP    Person(s) Educated Patient    Methods Explanation;Demonstration;Tactile cues;Verbal cues;Handout    Comprehension Verbalized understanding;Returned demonstration;Verbal cues required;Tactile cues required;Need further instruction            PT Short Term Goals - 01/09/20 1529      PT SHORT TERM GOAL #1   Title Pt will be independent with initial HEP    Baseline Newly provided    Time 4    Period Weeks    Status New    Target Date 02/06/20      PT SHORT TERM GOAL #2   Title Pt will be independent with self management of trigger points and muscle tightness    Baseline Decreased understanding    Time 4    Period Weeks    Status New    Target Date 02/06/20      PT SHORT TERM GOAL #3   Title Pt will be able to perform  10 squats to demonstrate improved functional strength of bilat LEs    Baseline Unable    Time 4    Period Weeks    Status New    Target Date 02/06/20             PT Long Term Goals - 01/09/20 1531      PT LONG TERM GOAL #1   Title Pt will be able to ascend/descend at least 20 steps with step over step pattern and SBA    Baseline Unable    Time 8    Period Weeks    Status New    Target Date 03/05/20      PT LONG TERM GOAL #2   Title Pt will rate pain to be </=5/10 in her back    Baseline 8/10    Time 8    Period Weeks    Status New    Target Date 03/05/20      PT LONG TERM GOAL #3   Title Pt will be able to lift 25 lbs with good body mechanics for home tasks    Baseline Unable    Time 8    Period Weeks    Status New    Target Date 03/05/20      PT LONG TERM GOAL #4   Title Pt's FOTO will improve to </=39%    Baseline 60% limitation    Time 8    Period Weeks    Status New    Target Date 03/05/20                 Plan - 01/20/20 1354    Clinical Impression Statement PT focused on low back and LE flexibility as well as low back and LE strengthening. Pt was able to complete multiple sit to stands with proper technique without increase in low back pain. Pt's reports indicates good improvement i pain levels in comparison to yesterday's session.    Personal Factors and Comorbidities Age;Comorbidity 1;Comorbidity 2;Comorbidity 3+    Comorbidities DM, CHF, obesity, fibromyalgia  Examination-Activity Limitations Bed Mobility;Carry;Lift;Stand;Stairs;Squat;Sit;Reach Overhead;Locomotion Level;Bend    Examination-Participation Restrictions Cleaning;Yard Work;Laundry;Community Activity    Stability/Clinical Decision Making Evolving/Moderate complexity    Clinical Decision Making Moderate    Rehab Potential Good    PT Frequency 2x / week    PT Duration 6 weeks    PT Treatment/Interventions ADLs/Self Care Home Management;Cryotherapy;Electrical  Stimulation;Iontophoresis 4mg /ml Dexamethasone;Moist Heat;Traction;Ultrasound;Neuromuscular re-education;Therapeutic exercise;Therapeutic activities;Functional mobility training;Stair training;Gait training;Patient/family education;Manual techniques;Dry needling;Passive range of motion;Taping;Spinal Manipulations;Joint Manipulations    PT Next Visit Plan Assess response to HEP. Complete to body mechanics for daily activities with associated functional exs.    PT Home Exercise Plan Access Code: [email or web address]    Consulted and Agree with Plan of Care Patient           Patient will benefit from skilled therapeutic intervention in order to improve the following deficits and impairments:  Difficulty walking, Decreased range of motion, Increased muscle spasms, Impaired UE functional use, Obesity, Decreased activity tolerance, Pain, Impaired flexibility, Decreased mobility, Decreased strength, Postural dysfunction  Visit Diagnosis: Mid-back pain, acute  Muscle weakness (generalized)  Decreased ROM of trunk and back     Problem List Patient Active Problem List   Diagnosis Date Noted  . CHF (congestive heart failure) ([place]) 12/10/2018  . History of recurrent UTIs 06/30/2018  . Mild intermittent asthma without complication 15/09/6977  . Prediabetes 06/30/2018  . Environmental and seasonal allergies 06/30/2018  . Diabetes ([place]) 04/04/2018  . Upper respiratory infection, viral 04/04/2018  . Urinary tract infection 04/04/2018  . Vitamin D deficiency 08/07/2016  . Morbid obesity with BMI of 40.0-44.9, adult ([place]) 02/18/2016  . Extrinsic asthma without complication 48/05/6551  . SOB (shortness of breath) 06/19/2015  . S/P carpal tunnel release 07/24/2014  . S/P cubital tunnel release 07/24/2014  . Cubital tunnel syndrome, right 07/06/2014  . Right carpal tunnel syndrome 07/06/2014  . Numbness of right hand 05/21/2014  . Hypokalemia 08/16/2013  . Diabetes mellitus type 2, uncomplicated ([place])  74/82/7078  . GERD (gastroesophageal reflux disease) 08/28/2011  . HTN (hypertension) 08/28/2011    Gar Ponto MS, PT 01/20/20 2:00 PM  [facility] [address] [place], [place], [address] Phone: [phone]   Fax:  [phone]  Name: Beth Macias MRN: [id number] Date of Birth: 13-Nov-1950

## 2020-01-24 ENCOUNTER — Ambulatory Visit: Payer: Medicare Other | Admitting: Physical Therapy

## 2020-01-24 ENCOUNTER — Other Ambulatory Visit: Payer: Self-pay

## 2020-01-24 DIAGNOSIS — M549 Dorsalgia, unspecified: Secondary | ICD-10-CM

## 2020-01-24 DIAGNOSIS — M2569 Stiffness of other specified joint, not elsewhere classified: Secondary | ICD-10-CM

## 2020-01-24 DIAGNOSIS — M545 Low back pain, unspecified: Secondary | ICD-10-CM

## 2020-01-24 DIAGNOSIS — M6281 Muscle weakness (generalized): Secondary | ICD-10-CM

## 2020-01-24 NOTE — Therapy (Signed)
[place], [place], [address] Phone: [phone]   Fax:  [phone]  Physical Therapy Treatment  Patient Details  Name: Beth Macias MRN: [id number] Date of Birth: Jun 25, 1950 Referring Provider (PT): Billie Ruddy, MD   Encounter Date: 01/24/2020   PT End of Session - 01/24/20 1529    Visit Number 4    Number of Visits 12    Authorization Type MEDICARE PART A AND B    Progress Note Due on Visit 10    PT Start Time 1448    PT Stop Time 1530    PT Time Calculation (min) 42 min    Activity Tolerance Patient tolerated treatment well    Behavior During Therapy [facility] for tasks assessed/performed           Past Medical History:  Diagnosis Date  . Asthma   . Blood in stool   . Colon polyps   . Diabetes mellitus without complication ([place])   . Diverticulitis   . Fibromyalgia   . GERD (gastroesophageal reflux disease)   . Gout   . Hypercholesteremia   . Hypertension   . Pneumonia   . Pneumonia   . UTI (urinary tract infection)     Past Surgical History:  Procedure Laterality Date  . APPENDECTOMY    . CESAREAN SECTION     x 3  . LIPOMA EXCISION    . MENISCUS REPAIR Left   . OVARIAN CYST REMOVAL    . OVARIAN CYST SURGERY      There were no vitals filed for this visit.   Subjective Assessment - 01/24/20 1450    Subjective Pt states her back is getting better.    Pertinent History Pt reports PT for rotator cuff deterioration 1.5 years ago; Hx of carpal and cubital tunnel releases    Limitations Lifting;Walking;Standing;House hold activities;Writing    Diagnostic tests No post accident diagnostic imaging completed    Patient Stated Goals For my pain to decrease and to be active with greater ease    Pain Score 6     Pain Location Back    Pain Onset More than a month ago                             [facility] Adult PT Treatment/Exercise - 01/24/20 0001       Lumbar Exercises: Stretches   Single Knee to Chest Stretch 3 reps;10 seconds    Lower Trunk Rotation 5 reps    Lower Trunk Rotation Limitations 3 sec      Lumbar Exercises: Aerobic   Nustep L6 x 5 min      Lumbar Exercises: Seated   Other Seated Lumbar Exercises on dynadisk: PPT x10, side to side x10, CW & CCW x10, marching x10      Lumbar Exercises: Supine   Bridge 10 reps;2 seconds    Other Supine Lumbar Exercises marching 2x10 bilat      Lumbar Exercises: Sidelying   Hip Abduction Right;Left;20 reps      Lumbar Exercises: Prone   Straight Leg Raise 10 reps      Manual Therapy   Joint Mobilization self lumbothoracic & lumbar PA mobs with foam roll    Soft tissue mobilization STW bilat QL and lumbar paraspinals                  PT Education - 01/24/20 1739  Education Details Discussed neutral spine and maintaining it in sitting and standing.    Person(s) Educated Patient    Methods Explanation;Demonstration;Tactile cues;Verbal cues    Comprehension Verbalized understanding;Returned demonstration;Verbal cues required;Tactile cues required            PT Short Term Goals - 01/09/20 1529      PT SHORT TERM GOAL #1   Title Pt will be independent with initial HEP    Baseline Newly provided    Time 4    Period Weeks    Status New    Target Date 02/06/20      PT SHORT TERM GOAL #2   Title Pt will be independent with self management of trigger points and muscle tightness    Baseline Decreased understanding    Time 4    Period Weeks    Status New    Target Date 02/06/20      PT SHORT TERM GOAL #3   Title Pt will be able to perform 10 squats to demonstrate improved functional strength of bilat LEs    Baseline Unable    Time 4    Period Weeks    Status New    Target Date 02/06/20             PT Long Term Goals - 01/09/20 1531      PT LONG TERM GOAL #1   Title Pt will be able to ascend/descend at least 20 steps with step over step pattern and SBA     Baseline Unable    Time 8    Period Weeks    Status New    Target Date 03/05/20      PT LONG TERM GOAL #2   Title Pt will rate pain to be </=5/10 in her back    Baseline 8/10    Time 8    Period Weeks    Status New    Target Date 03/05/20      PT LONG TERM GOAL #3   Title Pt will be able to lift 25 lbs with good body mechanics for home tasks    Baseline Unable    Time 8    Period Weeks    Status New    Target Date 03/05/20      PT LONG TERM GOAL #4   Title Pt's FOTO will improve to </=39%    Baseline 60% limitation    Time 8    Period Weeks    Status New    Target Date 03/05/20                 Plan - 01/24/20 1737    Clinical Impression Statement Treatment focused on continuing to progress low back and hip flexibility and strength. Pt with muscle spasm in the middle of session requiring manual therapy to address. Discussed neutral spine and pelvic mobility.    Personal Factors and Comorbidities Age;Comorbidity 1;Comorbidity 2;Comorbidity 3+    Comorbidities DM, CHF, obesity, fibromyalgia    Examination-Activity Limitations Bed Mobility;Carry;Lift;Stand;Stairs;Squat;Sit;Reach Overhead;Locomotion Level;Bend    Examination-Participation Restrictions Cleaning;Yard Work;Laundry;Community Activity    Stability/Clinical Decision Making Evolving/Moderate complexity    Rehab Potential Good    PT Frequency 2x / week    PT Duration 6 weeks    PT Treatment/Interventions ADLs/Self Care Home Management;Cryotherapy;Electrical Stimulation;Iontophoresis 4mg /ml Dexamethasone;Moist Heat;Traction;Ultrasound;Neuromuscular re-education;Therapeutic exercise;Therapeutic activities;Functional mobility training;Stair training;Gait training;Patient/family education;Manual techniques;Dry needling;Passive range of motion;Taping;Spinal Manipulations;Joint Manipulations    PT Next Visit Plan Assess response to HEP. Complete to body  mechanics for daily activities with associated functional exs.  Continue strengthening    PT Home Exercise Plan Access Code: [email or web address]. Practice neutral spine in sitting and standing.    Consulted and Agree with Plan of Care Patient           Patient will benefit from skilled therapeutic intervention in order to improve the following deficits and impairments:  Difficulty walking, Decreased range of motion, Increased muscle spasms, Impaired UE functional use, Obesity, Decreased activity tolerance, Pain, Impaired flexibility, Decreased mobility, Decreased strength, Postural dysfunction  Visit Diagnosis: No diagnosis found.     Problem List Patient Active Problem List   Diagnosis Date Noted  . CHF (congestive heart failure) ([place]) 12/10/2018  . History of recurrent UTIs 06/30/2018  . Mild intermittent asthma without complication 12/87/8676  . Prediabetes 06/30/2018  . Environmental and seasonal allergies 06/30/2018  . Diabetes ([place]) 04/04/2018  . Upper respiratory infection, viral 04/04/2018  . Urinary tract infection 04/04/2018  . Vitamin D deficiency 08/07/2016  . Morbid obesity with BMI of 40.0-44.9, adult ([place]) 02/18/2016  . Extrinsic asthma without complication 72/01/4708  . SOB (shortness of breath) 06/19/2015  . S/P carpal tunnel release 07/24/2014  . S/P cubital tunnel release 07/24/2014  . Cubital tunnel syndrome, right 07/06/2014  . Right carpal tunnel syndrome 07/06/2014  . Numbness of right hand 05/21/2014  . Hypokalemia 08/16/2013  . Diabetes mellitus type 2, uncomplicated ([place]) 62/83/6629  . GERD (gastroesophageal reflux disease) 08/28/2011  . HTN (hypertension) 08/28/2011    [facility] [address]  PT, DPT 01/24/2020, 5:41 PM  [facility] [address] [place], [place], [address] Phone: [phone]   Fax:  [phone]  Name: Beth Macias MRN: [id number] Date of Birth: Dec 30, 1950

## 2020-01-26 ENCOUNTER — Ambulatory Visit: Payer: Medicare Other | Attending: Family Medicine | Admitting: Physical Therapy

## 2020-01-26 ENCOUNTER — Telehealth: Payer: Self-pay | Admitting: Family Medicine

## 2020-01-26 ENCOUNTER — Other Ambulatory Visit: Payer: Self-pay

## 2020-01-26 DIAGNOSIS — M545 Low back pain, unspecified: Secondary | ICD-10-CM

## 2020-01-26 DIAGNOSIS — M2569 Stiffness of other specified joint, not elsewhere classified: Secondary | ICD-10-CM

## 2020-01-26 DIAGNOSIS — M549 Dorsalgia, unspecified: Secondary | ICD-10-CM | POA: Diagnosis present

## 2020-01-26 DIAGNOSIS — M6281 Muscle weakness (generalized): Secondary | ICD-10-CM

## 2020-01-26 DIAGNOSIS — M2560 Stiffness of unspecified joint, not elsewhere classified: Secondary | ICD-10-CM | POA: Insufficient documentation

## 2020-01-26 NOTE — Therapy (Signed)
[place], [place], [address] Phone: [phone]   Fax:  [phone]  Physical Therapy Treatment  Patient Details  Name: Beth Macias MRN: [id number] Date of Birth: 04/21/1951 Referring Provider (PT): Billie Ruddy, MD   Encounter Date: 01/26/2020   PT End of Session - 01/26/20 1408    Visit Number 5    Number of Visits 12    Authorization Type MEDICARE PART A AND B    Progress Note Due on Visit 10    PT Start Time 1408    PT Stop Time 1450    PT Time Calculation (min) 42 min    Activity Tolerance Patient tolerated treatment well    Behavior During Therapy [facility] for tasks assessed/performed           Past Medical History:  Diagnosis Date  . Asthma   . Blood in stool   . Colon polyps   . Diabetes mellitus without complication ([place])   . Diverticulitis   . Fibromyalgia   . GERD (gastroesophageal reflux disease)   . Gout   . Hypercholesteremia   . Hypertension   . Pneumonia   . Pneumonia   . UTI (urinary tract infection)     Past Surgical History:  Procedure Laterality Date  . APPENDECTOMY    . CESAREAN SECTION     x 3  . LIPOMA EXCISION    . MENISCUS REPAIR Left   . OVARIAN CYST REMOVAL    . OVARIAN CYST SURGERY      There were no vitals filed for this visit.   Subjective Assessment - 01/26/20 1625    Subjective Pt complains of more upper right thoracic back tightness this session vs low back. Pt states low back has been feeling okay.    Pertinent History Pt reports PT for rotator cuff deterioration 1.5 years ago; Hx of carpal and cubital tunnel releases    Limitations Lifting;Walking;Standing;House hold activities;Writing    Diagnostic tests No post accident diagnostic imaging completed    Patient Stated Goals For my pain to decrease and to be active with greater ease    Currently in Pain? Yes    Pain Score 4     Pain Location Back    Pain Orientation Lower     Pain Onset More than a month ago                             [facility] Adult PT Treatment/Exercise - 01/26/20 0001      Lumbar Exercises: Aerobic   Nustep L6 x 7 min      Lumbar Exercises: Standing   Heel Raises 20 reps      Lumbar Exercises: Seated   Other Seated Lumbar Exercises on dynadisk: PPT x10, side to side x10, LAQ x10    Other Seated Lumbar Exercises pball under ankle roll in/out & side to side x10 each      Shoulder Exercises: Sidelying   Other Sidelying Exercises Open/close book x 20 bilat      Manual Therapy   Joint Mobilization thoracic PA mobs grade II    Soft tissue mobilization STW R rhomboids, thoracic paraspinals, infraspinatus, lats                    PT Short Term Goals - 01/09/20 1529      PT SHORT TERM GOAL #1   Title Pt will be  independent with initial HEP    Baseline Newly provided    Time 4    Period Weeks    Status New    Target Date 02/06/20      PT SHORT TERM GOAL #2   Title Pt will be independent with self management of trigger points and muscle tightness    Baseline Decreased understanding    Time 4    Period Weeks    Status New    Target Date 02/06/20      PT SHORT TERM GOAL #3   Title Pt will be able to perform 10 squats to demonstrate improved functional strength of bilat LEs    Baseline Unable    Time 4    Period Weeks    Status New    Target Date 02/06/20             PT Long Term Goals - 01/09/20 1531      PT LONG TERM GOAL #1   Title Pt will be able to ascend/descend at least 20 steps with step over step pattern and SBA    Baseline Unable    Time 8    Period Weeks    Status New    Target Date 03/05/20      PT LONG TERM GOAL #2   Title Pt will rate pain to be </=5/10 in her back    Baseline 8/10    Time 8    Period Weeks    Status New    Target Date 03/05/20      PT LONG TERM GOAL #3   Title Pt will be able to lift 25 lbs with good body mechanics for home tasks    Baseline Unable     Time 8    Period Weeks    Status New    Target Date 03/05/20      PT LONG TERM GOAL #4   Title Pt's FOTO will improve to </=39%    Baseline 60% limitation    Time 8    Period Weeks    Status New    Target Date 03/05/20                 Plan - 01/26/20 1626    Clinical Impression Statement Manual therapy, stretching, and exercises performed to address pt's R upper scapular/thoracic tightness. Continued to address low back with progressing core strength.    Personal Factors and Comorbidities Age;Comorbidity 1;Comorbidity 2;Comorbidity 3+    Comorbidities DM, CHF, obesity, fibromyalgia    Examination-Activity Limitations Bed Mobility;Carry;Lift;Stand;Stairs;Squat;Sit;Reach Overhead;Locomotion Level;Bend    Examination-Participation Restrictions Cleaning;Yard Work;Laundry;Community Activity    Stability/Clinical Decision Making Evolving/Moderate complexity    Rehab Potential Good    PT Frequency 2x / week    PT Duration 6 weeks    PT Treatment/Interventions ADLs/Self Care Home Management;Cryotherapy;Electrical Stimulation;Iontophoresis 4mg /ml Dexamethasone;Moist Heat;Traction;Ultrasound;Neuromuscular re-education;Therapeutic exercise;Therapeutic activities;Functional mobility training;Stair training;Gait training;Patient/family education;Manual techniques;Dry needling;Passive range of motion;Taping;Spinal Manipulations;Joint Manipulations    PT Next Visit Plan Assess response to HEP. Complete to body mechanics for daily activities with associated functional exs. Continue strengthening    PT Home Exercise Plan Access Code: [email or web address]. Practice neutral spine in sitting and standing.    Consulted and Agree with Plan of Care Patient           Patient will benefit from skilled therapeutic intervention in order to improve the following deficits and impairments:  Difficulty walking, Decreased range of motion, Increased muscle spasms, Impaired UE functional use, Obesity, Decreased  activity tolerance, Pain, Impaired flexibility, Decreased mobility, Decreased strength, Postural dysfunction  Visit Diagnosis: Mid-back pain, acute  Muscle weakness (generalized)  Decreased ROM of trunk and back  Acute midline low back pain without sciatica     Problem List Patient Active Problem List   Diagnosis Date Noted  . CHF (congestive heart failure) ([place]) 12/10/2018  . History of recurrent UTIs 06/30/2018  . Mild intermittent asthma without complication 49/70/2637  . Prediabetes 06/30/2018  . Environmental and seasonal allergies 06/30/2018  . Diabetes ([place]) 04/04/2018  . Upper respiratory infection, viral 04/04/2018  . Urinary tract infection 04/04/2018  . Vitamin D deficiency 08/07/2016  . Morbid obesity with BMI of 40.0-44.9, adult ([place]) 02/18/2016  . Extrinsic asthma without complication 85/88/5027  . SOB (shortness of breath) 06/19/2015  . S/P carpal tunnel release 07/24/2014  . S/P cubital tunnel release 07/24/2014  . Cubital tunnel syndrome, right 07/06/2014  . Right carpal tunnel syndrome 07/06/2014  . Numbness of right hand 05/21/2014  . Hypokalemia 08/16/2013  . Diabetes mellitus type 2, uncomplicated ([place]) 74/04/8785  . GERD (gastroesophageal reflux disease) 08/28/2011  . HTN (hypertension) 08/28/2011    [facility] April Gordy Levan PT, DPT 01/26/2020, 4:31 PM  [facility] [address] [place], [place], [address] Phone: [phone]   Fax:  [phone]  Name: Rocio Roam MRN: [id number] Date of Birth: 1951-02-28

## 2020-01-26 NOTE — Telephone Encounter (Signed)
Left message for patient to schedule Annual Wellness Visit.  Please schedule with Nurse Health Advisor Shannon Crews, RN at [facility]  

## 2020-02-01 ENCOUNTER — Other Ambulatory Visit: Payer: Self-pay | Admitting: Family Medicine

## 2020-02-01 ENCOUNTER — Ambulatory Visit: Payer: Medicare Other | Admitting: Physical Therapy

## 2020-02-01 ENCOUNTER — Other Ambulatory Visit: Payer: Self-pay

## 2020-02-01 DIAGNOSIS — M549 Dorsalgia, unspecified: Secondary | ICD-10-CM

## 2020-02-01 DIAGNOSIS — M545 Low back pain, unspecified: Secondary | ICD-10-CM

## 2020-02-01 DIAGNOSIS — M2569 Stiffness of other specified joint, not elsewhere classified: Secondary | ICD-10-CM

## 2020-02-01 DIAGNOSIS — M6281 Muscle weakness (generalized): Secondary | ICD-10-CM

## 2020-02-01 DIAGNOSIS — Z1231 Encounter for screening mammogram for malignant neoplasm of breast: Secondary | ICD-10-CM

## 2020-02-01 NOTE — Therapy (Signed)
[place], [place], [address] Phone: [phone]   Fax:  [phone]  Physical Therapy Treatment   Patient Details  Name: Beth Macias MRN: [id number] Date of Birth: 13-Mar-1951 Referring Provider (PT): Billie Ruddy, MD   Encounter Date: 02/01/2020   PT End of Session - 02/01/20 1533    Visit Number 6    Number of Visits 12    Authorization Type MEDICARE PART A AND B    Progress Note Due on Visit 10    PT Start Time 3354    PT Stop Time 1618    PT Time Calculation (min) 45 min    Activity Tolerance Patient tolerated treatment well    Behavior During Therapy [facility] for tasks assessed/performed           Past Medical History:  Diagnosis Date  . Asthma   . Blood in stool   . Colon polyps   . Diabetes mellitus without complication ([place])   . Diverticulitis   . Fibromyalgia   . GERD (gastroesophageal reflux disease)   . Gout   . Hypercholesteremia   . Hypertension   . Pneumonia   . Pneumonia   . UTI (urinary tract infection)     Past Surgical History:  Procedure Laterality Date  . APPENDECTOMY    . CESAREAN SECTION     x 3  . LIPOMA EXCISION    . MENISCUS REPAIR Left   . OVARIAN CYST REMOVAL    . OVARIAN CYST SURGERY      There were no vitals filed for this visit.   Subjective Assessment - 02/01/20 1537    Subjective Pt states she's been busy with the grandchild. Pt states her trigger finger has been spasming. Pt states that the back has been feeling pretty good. She reports that she continues to mostly feel it in her upper right thoracic. Pt attributes increased pain due to rain.    Pertinent History Pt reports PT for rotator cuff deterioration 1.5 years ago; Hx of carpal and cubital tunnel releases    Limitations Lifting;Walking;Standing;House hold activities;Writing    Diagnostic tests No post accident diagnostic imaging completed    Patient Stated Goals For my pain  to decrease and to be active with greater ease    Currently in Pain? Yes    Pain Score 7     Pain Location Generalized    Pain Descriptors / Indicators Aching    Pain Onset More than a month ago                             [facility] Adult PT Treatment/Exercise - 02/01/20 0001      Lumbar Exercises: Aerobic   Nustep L5 x 7 min      Lumbar Exercises: Seated   Sit to Stand 10 reps    Other Seated Lumbar Exercises on dynadisk: marching x10, diagonal chop with red medicine ball x10    Other Seated Lumbar Exercises pball crunch forward, L & R side 2x10; modified squat with chair x10      Lumbar Exercises: Sidelying   Hip Abduction Right;Left;20 reps    Hip Abduction Weights (lbs) 2      Shoulder Exercises: Stretch   Other Shoulder Stretches open/close book x10 bilat                  PT Education - 02/01/20 1625  Education Details Discussed FOTO results.    Person(s) Educated Patient    Methods Explanation;Demonstration;Tactile cues;Verbal cues    Comprehension Verbalized understanding;Returned demonstration;Verbal cues required            PT Short Term Goals - 02/01/20 1612      PT SHORT TERM GOAL #1   Title Pt will be independent with initial HEP    Baseline Newly provided    Time 4    Period Weeks    Status Partially Met    Target Date 02/06/20      PT SHORT TERM GOAL #2   Title Pt will be independent with self management of trigger points and muscle tightness    Baseline Decreased understanding    Time 4    Period Weeks    Status Partially Met    Target Date 02/06/20      PT SHORT TERM GOAL #3   Title Pt will be able to perform 10 squats to demonstrate improved functional strength of bilat LEs    Time 4    Period Weeks    Status Achieved    Target Date 02/06/20             PT Long Term Goals - 01/09/20 1531      PT LONG TERM GOAL #1   Title Pt will be able to ascend/descend at least 20 steps with step over step pattern and  SBA    Baseline Unable    Time 8    Period Weeks    Status New    Target Date 03/05/20      PT LONG TERM GOAL #2   Title Pt will rate pain to be </=5/10 in her back    Baseline 8/10    Time 8    Period Weeks    Status New    Target Date 03/05/20      PT LONG TERM GOAL #3   Title Pt will be able to lift 25 lbs with good body mechanics for home tasks    Baseline Unable    Time 8    Period Weeks    Status New    Target Date 03/05/20      PT LONG TERM GOAL #4   Title Pt's FOTO will improve to </=39%    Baseline 60% limitation    Time 8    Period Weeks    Status New    Target Date 03/05/20                 Plan - 02/01/20 1613    Clinical Impression Statement Pt with continued R upper thoracic/shoulder tightness. Manual therapy and stretching performed. Continued to progress core and LE strengthening this session. Pt able to complete 10 squats with no issues. Pt with difficulty performing HEP due to assisting in the care of her grandchild. Pt is otherwise making good gains for low back. Pt with improving FOTO score to 46%.    Personal Factors and Comorbidities Age;Comorbidity 1;Comorbidity 2;Comorbidity 3+    Comorbidities DM, CHF, obesity, fibromyalgia    Examination-Activity Limitations Bed Mobility;Carry;Lift;Stand;Stairs;Squat;Sit;Reach Overhead;Locomotion Level;Bend    Examination-Participation Restrictions Cleaning;Yard Work;Laundry;Community Activity    Stability/Clinical Decision Making Evolving/Moderate complexity    Rehab Potential Good    PT Frequency 2x / week    PT Duration 6 weeks    PT Treatment/Interventions ADLs/Self Care Home Management;Cryotherapy;Electrical Stimulation;Iontophoresis 51m/ml Dexamethasone;Moist Heat;Traction;Ultrasound;Neuromuscular re-education;Therapeutic exercise;Therapeutic activities;Functional mobility training;Stair training;Gait training;Patient/family education;Manual techniques;Dry needling;Passive range of motion;Taping;Spinal  Manipulations;Joint Manipulations    PT Next Visit Plan Assess response to HEP. Continue strengthening. Progress to step training. Try treadmill.    PT Home Exercise Plan Access Code: [email or web address]. Practice neutral spine in sitting and standing.    Consulted and Agree with Plan of Care Patient           Patient will benefit from skilled therapeutic intervention in order to improve the following deficits and impairments:  Difficulty walking, Decreased range of motion, Increased muscle spasms, Impaired UE functional use, Obesity, Decreased activity tolerance, Pain, Impaired flexibility, Decreased mobility, Decreased strength, Postural dysfunction  Visit Diagnosis: Mid-back pain, acute  Muscle weakness (generalized)  Decreased ROM of trunk and back  Acute midline low back pain without sciatica     Problem List Patient Active Problem List   Diagnosis Date Noted  . CHF (congestive heart failure) ([place]) 12/10/2018  . History of recurrent UTIs 06/30/2018  . Mild intermittent asthma without complication 94/76/5465  . Prediabetes 06/30/2018  . Environmental and seasonal allergies 06/30/2018  . Diabetes ([place]) 04/04/2018  . Upper respiratory infection, viral 04/04/2018  . Urinary tract infection 04/04/2018  . Vitamin D deficiency 08/07/2016  . Morbid obesity with BMI of 40.0-44.9, adult ([place]) 02/18/2016  . Extrinsic asthma without complication 03/54/6568  . SOB (shortness of breath) 06/19/2015  . S/P carpal tunnel release 07/24/2014  . S/P cubital tunnel release 07/24/2014  . Cubital tunnel syndrome, right 07/06/2014  . Right carpal tunnel syndrome 07/06/2014  . Numbness of right hand 05/21/2014  . Hypokalemia 08/16/2013  . Diabetes mellitus type 2, uncomplicated ([place]) 12/75/1700  . GERD (gastroesophageal reflux disease) 08/28/2011  . HTN (hypertension) 08/28/2011    [facility] [address] PT, DPT 02/01/2020, 4:25 PM  [facility] [address] [place], [place], [address] Phone: [phone]   Fax:  [phone]  Name: Beth Macias MRN: [id number] Date of Birth: 1950/11/12

## 2020-02-03 ENCOUNTER — Ambulatory Visit: Payer: Medicare Other | Admitting: Physical Therapy

## 2020-02-03 ENCOUNTER — Other Ambulatory Visit: Payer: Self-pay

## 2020-02-03 DIAGNOSIS — M6281 Muscle weakness (generalized): Secondary | ICD-10-CM

## 2020-02-03 DIAGNOSIS — M2569 Stiffness of other specified joint, not elsewhere classified: Secondary | ICD-10-CM

## 2020-02-03 DIAGNOSIS — M545 Low back pain, unspecified: Secondary | ICD-10-CM

## 2020-02-03 DIAGNOSIS — M549 Dorsalgia, unspecified: Secondary | ICD-10-CM | POA: Diagnosis not present

## 2020-02-03 NOTE — Therapy (Signed)
[place], [place], [address] Phone: [phone]   Fax:  [phone]  Physical Therapy Treatment  Patient Details  Name: Beth Macias MRN: [id number] Date of Birth: [birth year] Referring Provider (PT): Billie Ruddy, MD   Encounter Date: 02/03/2020   PT End of Session - 02/03/20 1353    Visit Number 7    Number of Visits 12    Authorization Type MEDICARE PART A AND B    Progress Note Due on Visit 10    PT Start Time 8588    PT Stop Time 1432    PT Time Calculation (min) 38 min    Activity Tolerance Patient tolerated treatment well    Behavior During Therapy [facility] for tasks assessed/performed           Past Medical History:  Diagnosis Date  . Asthma   . Blood in stool   . Colon polyps   . Diabetes mellitus without complication ([place])   . Diverticulitis   . Fibromyalgia   . GERD (gastroesophageal reflux disease)   . Gout   . Hypercholesteremia   . Hypertension   . Pneumonia   . Pneumonia   . UTI (urinary tract infection)     Past Surgical History:  Procedure Laterality Date  . APPENDECTOMY    . CESAREAN SECTION     x 3  . LIPOMA EXCISION    . MENISCUS REPAIR Left   . OVARIAN CYST REMOVAL    . OVARIAN CYST SURGERY      There were no vitals filed for this visit.   Subjective Assessment - 02/03/20 1356    Subjective Pt reports she can't complain today.    Pertinent History Pt reports PT for rotator cuff deterioration 1.5 years ago; Hx of carpal and cubital tunnel releases    Limitations Lifting;Walking;Standing;House hold activities;Writing    Diagnostic tests No post accident diagnostic imaging completed    Patient Stated Goals For my pain to decrease and to be active with greater ease    Currently in Pain? Yes    Pain Score 4     Pain Location Generalized    Pain Onset More than a month ago                             [facility] Adult PT  Treatment/Exercise - 02/03/20 0001      Lumbar Exercises: Stretches   Hip Flexor Stretch Right;Left;20 seconds    Other Lumbar Stretch Exercise L stretch at counter x20 sec, side stretch at counter x 20 sec      Lumbar Exercises: Aerobic   Tread Mill 0.7 - 1.2 mph x 4 min      Lumbar Exercises: Standing   Heel Raises 20 reps    Other Standing Lumbar Exercises palloff press in tandem stance green tband x10; tandem stance with rotation x10 bilat    Other Standing Lumbar Exercises counter squat x10      Knee/Hip Exercises: Standing   Hip Abduction Stengthening;Both;2 sets;10 reps    Abduction Limitations red tband    Hip Extension Stengthening;Both;2 sets;10 reps    Extension Limitations red tband    Forward Step Up Both;10 reps;Hand Hold: 1;Step Height: 4"    Step Down Both;10 reps;Hand Hold: 2;Step Height: 4"    Other Standing Knee Exercises On airex: Feet together eyes closed 3x10 sec, marching 3x10,  PT Short Term Goals - 02/01/20 1612      PT SHORT TERM GOAL #1   Title Pt will be independent with initial HEP    Baseline Newly provided    Time 4    Period Weeks    Status Partially Met    Target Date 02/06/20      PT SHORT TERM GOAL #2   Title Pt will be independent with self management of trigger points and muscle tightness    Baseline Decreased understanding    Time 4    Period Weeks    Status Partially Met    Target Date 02/06/20      PT SHORT TERM GOAL #3   Title Pt will be able to perform 10 squats to demonstrate improved functional strength of bilat LEs    Time 4    Period Weeks    Status Achieved    Target Date 02/06/20             PT Long Term Goals - 01/09/20 1531      PT LONG TERM GOAL #1   Title Pt will be able to ascend/descend at least 20 steps with step over step pattern and SBA    Baseline Unable    Time 8    Period Weeks    Status New    Target Date 03/05/20      PT LONG TERM GOAL #2   Title Pt will rate  pain to be </=5/10 in her back    Baseline 8/10    Time 8    Period Weeks    Status New    Target Date 03/05/20      PT LONG TERM GOAL #3   Title Pt will be able to lift 25 lbs with good body mechanics for home tasks    Baseline Unable    Time 8    Period Weeks    Status New    Target Date 03/05/20      PT LONG TERM GOAL #4   Title Pt's FOTO will improve to </=39%    Baseline 60% limitation    Time 8    Period Weeks    Status New    Target Date 03/05/20                 Plan - 02/03/20 1414    Clinical Impression Statement Pt with less pain today. Treatment focused primarily on LE strengthening, trunk and core stability in standing with balance/proprioception. Pt demonstrates poor balance on unstable surface (tolerates ~10 sec before LOB with eyes closed). Pt toleratd step training and treadmill well with no issues    Personal Factors and Comorbidities Age;Comorbidity 1;Comorbidity 2;Comorbidity 3+    Comorbidities DM, CHF, obesity, fibromyalgia    Examination-Activity Limitations Bed Mobility;Carry;Lift;Stand;Stairs;Squat;Sit;Reach Overhead;Locomotion Level;Bend    Examination-Participation Restrictions Cleaning;Yard Work;Laundry;Community Activity    Stability/Clinical Decision Making Evolving/Moderate complexity    Rehab Potential Good    PT Frequency 2x / week    PT Duration 6 weeks    PT Treatment/Interventions ADLs/Self Care Home Management;Cryotherapy;Electrical Stimulation;Iontophoresis 5m/ml Dexamethasone;Moist Heat;Traction;Ultrasound;Neuromuscular re-education;Therapeutic exercise;Therapeutic activities;Functional mobility training;Stair training;Gait training;Patient/family education;Manual techniques;Dry needling;Passive range of motion;Taping;Spinal Manipulations;Joint Manipulations    PT Next Visit Plan Assess response to HEP. Continue strengthening. Progress balance and step training. Continue treadmill for endurance/walking.    PT Home Exercise Plan Access  Code: [email or web address]. Hip abduction and extension with red tband in standing    Consulted and Agree with Plan of Care Patient  Patient will benefit from skilled therapeutic intervention in order to improve the following deficits and impairments:  Difficulty walking, Decreased range of motion, Increased muscle spasms, Impaired UE functional use, Obesity, Decreased activity tolerance, Pain, Impaired flexibility, Decreased mobility, Decreased strength, Postural dysfunction  Visit Diagnosis: Mid-back pain, acute  Muscle weakness (generalized)  Decreased ROM of trunk and back  Acute midline low back pain without sciatica     Problem List Patient Active Problem List   Diagnosis Date Noted  . CHF (congestive heart failure) ([place]) 12/10/2018  . History of recurrent UTIs 06/30/2018  . Mild intermittent asthma without complication 37/29/4262  . Prediabetes 06/30/2018  . Environmental and seasonal allergies 06/30/2018  . Diabetes ([place]) 04/04/2018  . Upper respiratory infection, viral 04/04/2018  . Urinary tract infection 04/04/2018  . Vitamin D deficiency 08/07/2016  . Morbid obesity with BMI of 40.0-44.9, adult ([place]) 02/18/2016  . Extrinsic asthma without complication 70/08/8496  . SOB (shortness of breath) 06/19/2015  . S/P carpal tunnel release 07/24/2014  . S/P cubital tunnel release 07/24/2014  . Cubital tunnel syndrome, right 07/06/2014  . Right carpal tunnel syndrome 07/06/2014  . Numbness of right hand 05/21/2014  . Hypokalemia 08/16/2013  . Diabetes mellitus type 2, uncomplicated ([place]) 65/16/8610  . GERD (gastroesophageal reflux disease) 08/28/2011  . HTN (hypertension) 08/28/2011    [facility] [address] PT, DPT 02/03/2020, 2:39 PM  [facility] [address] [place], [place], [address] Phone: [phone]   Fax:  [phone]  Name: Emonni Depasquale MRN: [id number] Date of Birth: 11-May-1951

## 2020-02-06 ENCOUNTER — Encounter: Payer: Self-pay | Admitting: Physical Therapy

## 2020-02-06 ENCOUNTER — Ambulatory Visit: Payer: Medicare Other | Admitting: Physical Therapy

## 2020-02-06 ENCOUNTER — Other Ambulatory Visit: Payer: Self-pay

## 2020-02-06 ENCOUNTER — Telehealth: Payer: Self-pay | Admitting: Family Medicine

## 2020-02-06 DIAGNOSIS — M6281 Muscle weakness (generalized): Secondary | ICD-10-CM

## 2020-02-06 DIAGNOSIS — M2569 Stiffness of other specified joint, not elsewhere classified: Secondary | ICD-10-CM

## 2020-02-06 DIAGNOSIS — M549 Dorsalgia, unspecified: Secondary | ICD-10-CM | POA: Diagnosis not present

## 2020-02-06 DIAGNOSIS — M545 Low back pain, unspecified: Secondary | ICD-10-CM

## 2020-02-06 NOTE — Telephone Encounter (Signed)
Patient called to get information on the COVID booster. She is going to a medical convention with her daughter in October to [place]. She said that they are not specifying to have the COVID booster but the are requesting to have both vaccines. The patient was wanting to know if you suggest that she have the booster before she goes to [place]?  Her main concern is wanting to know if she needs the booster because she had pneumonia in 2019 and wanted to know if she qualified for the booster.  Please advise

## 2020-02-06 NOTE — Therapy (Signed)
[facility] [address] [place], [place], [address] Phone: [phone]   Fax:  [phone]  Physical Therapy Treatment  Patient Details  Name: Beth Macias MRN: [id number] Date of Birth: 10-22-1950 Referring Provider (PT): Deeann Saint, MD   Encounter Date: 02/06/2020   PT End of Session - 02/06/20 1507    Visit Number 8    Number of Visits 12    Authorization Type MEDICARE PART A AND B    Progress Note Due on Visit 10    PT Start Time 1450    PT Stop Time 1535    PT Time Calculation (min) 45 min    Activity Tolerance Patient tolerated treatment well    Behavior During Therapy [facility] for tasks assessed/performed           Past Medical History:  Diagnosis Date  . Asthma   . Blood in stool   . Colon polyps   . Diabetes mellitus without complication (HCC)   . Diverticulitis   . Fibromyalgia   . GERD (gastroesophageal reflux disease)   . Gout   . Hypercholesteremia   . Hypertension   . Pneumonia   . Pneumonia   . UTI (urinary tract infection)     Past Surgical History:  Procedure Laterality Date  . APPENDECTOMY    . CESAREAN SECTION     x 3  . LIPOMA EXCISION    . MENISCUS REPAIR Left   . OVARIAN CYST REMOVAL    . OVARIAN CYST SURGERY      There were no vitals filed for this visit.   Subjective Assessment - 02/06/20 1452    Subjective Pt states she's doing okay.    Pertinent History Pt reports PT for rotator cuff deterioration 1.5 years ago; Hx of carpal and cubital tunnel releases    Limitations Lifting;Walking;Standing;House hold activities;Writing    Diagnostic tests No post accident diagnostic imaging completed    Patient Stated Goals For my pain to decrease and to be active with greater ease    Currently in Pain? Yes    Pain Score 4     Pain Location Generalized    Pain Onset More than a month ago                             [facility] Adult PT  Treatment/Exercise - 02/06/20 0001      Lumbar Exercises: Stretches   Hip Flexor Stretch Right;Left;30 seconds    Gastroc Stretch Left;Right;30 seconds    Gastroc Stretch Limitations slant borad    Other Lumbar Stretch Exercise L stretch at counter x30 sec, side stretch at counter x 30 sec    Other Lumbar Stretch Exercise soleus stretch bilat 30 sec slant board      Lumbar Exercises: Aerobic   Tread Mill 1 mph x 6 min      Lumbar Exercises: Standing   Heel Raises 20 reps    Heel Raises Limitations on slant board    Other Standing Lumbar Exercises counter squat x20      Knee/Hip Exercises: Standing   Hip Abduction Stengthening;Both;2 sets;10 reps    Abduction Limitations green tband    Hip Extension Stengthening;Both;2 sets;10 reps    Extension Limitations green tband    Rocker Board 1 minute    Other Standing Knee Exercises Tandem stance EO x20 sec bilat, EC 2x20 sec bilat    Other Standing Knee Exercises counter  squat 2x10, modified deadlift x10                    PT Short Term Goals - 02/01/20 1612      PT SHORT TERM GOAL #1   Title Pt will be independent with initial HEP    Baseline Newly provided    Time 4    Period Weeks    Status Partially Met    Target Date 02/06/20      PT SHORT TERM GOAL #2   Title Pt will be independent with self management of trigger points and muscle tightness    Baseline Decreased understanding    Time 4    Period Weeks    Status Partially Met    Target Date 02/06/20      PT SHORT TERM GOAL #3   Title Pt will be able to perform 10 squats to demonstrate improved functional strength of bilat LEs    Time 4    Period Weeks    Status Achieved    Target Date 02/06/20             PT Long Term Goals - 01/09/20 1531      PT LONG TERM GOAL #1   Title Pt will be able to ascend/descend at least 20 steps with step over step pattern and SBA    Baseline Unable    Time 8    Period Weeks    Status New    Target Date 03/05/20       PT LONG TERM GOAL #2   Title Pt will rate pain to be </=5/10 in her back    Baseline 8/10    Time 8    Period Weeks    Status New    Target Date 03/05/20      PT LONG TERM GOAL #3   Title Pt will be able to lift 25 lbs with good body mechanics for home tasks    Baseline Unable    Time 8    Period Weeks    Status New    Target Date 03/05/20      PT LONG TERM GOAL #4   Title Pt's FOTO will improve to </=39%    Baseline 60% limitation    Time 8    Period Weeks    Status New    Target Date 03/05/20                  Patient will benefit from skilled therapeutic intervention in order to improve the following deficits and impairments:     Visit Diagnosis: Mid-back pain, acute  Muscle weakness (generalized)  Decreased ROM of trunk and back  Acute midline low back pain without sciatica     Problem List Patient Active Problem List   Diagnosis Date Noted  . CHF (congestive heart failure) ([place]) 12/10/2018  . History of recurrent UTIs 06/30/2018  . Mild intermittent asthma without complication 19/37/9024  . Prediabetes 06/30/2018  . Environmental and seasonal allergies 06/30/2018  . Diabetes ([place]) 04/04/2018  . Upper respiratory infection, viral 04/04/2018  . Urinary tract infection 04/04/2018  . Vitamin D deficiency 08/07/2016  . Morbid obesity with BMI of 40.0-44.9, adult ([place]) 02/18/2016  . Extrinsic asthma without complication 09/73/5329  . SOB (shortness of breath) 06/19/2015  . S/P carpal tunnel release 07/24/2014  . S/P cubital tunnel release 07/24/2014  . Cubital tunnel syndrome, right 07/06/2014  . Right carpal tunnel syndrome 07/06/2014  . Numbness of right  hand 05/21/2014  . Hypokalemia 08/16/2013  . Diabetes mellitus type 2, uncomplicated ([place]) 93/81/0175  . GERD (gastroesophageal reflux disease) 08/28/2011  . HTN (hypertension) 08/28/2011    [facility] [address] PT, DPT 02/06/2020, 5:29 PM  [facility] [address] [place], [place], [address] Phone: [phone]   Fax:  [phone]  Name: Tynika Luddy MRN: [id number] Date of Birth: 1950-08-18

## 2020-02-07 NOTE — Telephone Encounter (Signed)
Pt already has had both of her COVID-19 Vaccines, please advise

## 2020-02-08 ENCOUNTER — Encounter: Payer: Self-pay | Admitting: Physical Therapy

## 2020-02-08 ENCOUNTER — Other Ambulatory Visit: Payer: Self-pay

## 2020-02-08 ENCOUNTER — Ambulatory Visit: Payer: Medicare Other | Admitting: Physical Therapy

## 2020-02-08 DIAGNOSIS — M549 Dorsalgia, unspecified: Secondary | ICD-10-CM

## 2020-02-08 DIAGNOSIS — M545 Low back pain, unspecified: Secondary | ICD-10-CM

## 2020-02-08 DIAGNOSIS — M2569 Stiffness of other specified joint, not elsewhere classified: Secondary | ICD-10-CM

## 2020-02-08 DIAGNOSIS — M6281 Muscle weakness (generalized): Secondary | ICD-10-CM

## 2020-02-08 NOTE — Therapy (Signed)
[place], [place], [address] Phone: [phone]   Fax:  [phone]  Physical Therapy Treatment  Patient Details  Name: Parnika Tweten MRN: [id number] Date of Birth: 1950/07/06 Referring Provider (PT): Billie Ruddy, MD   Encounter Date: 02/08/2020   PT End of Session - 02/08/20 1535    Visit Number 9    Number of Visits 12    Authorization Type MEDICARE PART A AND B    Progress Note Due on Visit 10    PT Start Time 1455    PT Stop Time 1545    PT Time Calculation (min) 50 min    Activity Tolerance Patient tolerated treatment well    Behavior During Therapy [facility] for tasks assessed/performed           Past Medical History:  Diagnosis Date  . Asthma   . Blood in stool   . Colon polyps   . Diabetes mellitus without complication ([place])   . Diverticulitis   . Fibromyalgia   . GERD (gastroesophageal reflux disease)   . Gout   . Hypercholesteremia   . Hypertension   . Pneumonia   . Pneumonia   . UTI (urinary tract infection)     Past Surgical History:  Procedure Laterality Date  . APPENDECTOMY    . CESAREAN SECTION     x 3  . LIPOMA EXCISION    . MENISCUS REPAIR Left   . OVARIAN CYST REMOVAL    . OVARIAN CYST SURGERY      There were no vitals filed for this visit.   Subjective Assessment - 02/08/20 1500    Subjective Pt reports no new complaints. Pt reports no pain just aches.    Pertinent History Pt reports PT for rotator cuff deterioration 1.5 years ago; Hx of carpal and cubital tunnel releases    Limitations Lifting;Walking;Standing;House hold activities;Writing    Diagnostic tests No post accident diagnostic imaging completed    Patient Stated Goals For my pain to decrease and to be active with greater ease    Currently in Pain? No/denies    Pain Orientation Lower    Pain Onset More than a month ago                             [facility] Adult PT  Treatment/Exercise - 02/08/20 0001      Lumbar Exercises: Stretches   Gastroc Stretch Left;Right;30 seconds    Gastroc Stretch Limitations slant borad    Other Lumbar Stretch Exercise soleus stretch bilat 30 sec slant board      Lumbar Exercises: Aerobic   Tread Mill 1.6 mph x 6 min      Lumbar Exercises: Standing   Heel Raises 20 reps    Heel Raises Limitations on slant board    Other Standing Lumbar Exercises modified deadlift butt against wall x10      Lumbar Exercises: Seated   Sit to Stand 20 reps      Knee/Hip Exercises: Standing   Other Standing Knee Exercises on airex: feet together eyes open 30 sec, eyes closed 2x30 sec, tandem stance x30 sec bilat, tandem stance with head nods up/down & side to side x30 sec each, tandem stance eyes closed x30 sec      Moist Heat Therapy   Number Minutes Moist Heat 10 Minutes    Moist Heat Location Lumbar Spine  PT Short Term Goals - 02/01/20 1612      PT SHORT TERM GOAL #1   Title Pt will be independent with initial HEP    Baseline Newly provided    Time 4    Period Weeks    Status Partially Met    Target Date 02/06/20      PT SHORT TERM GOAL #2   Title Pt will be independent with self management of trigger points and muscle tightness    Baseline Decreased understanding    Time 4    Period Weeks    Status Partially Met    Target Date 02/06/20      PT SHORT TERM GOAL #3   Title Pt will be able to perform 10 squats to demonstrate improved functional strength of bilat LEs    Time 4    Period Weeks    Status Achieved    Target Date 02/06/20             PT Long Term Goals - 01/09/20 1531      PT LONG TERM GOAL #1   Title Pt will be able to ascend/descend at least 20 steps with step over step pattern and SBA    Baseline Unable    Time 8    Period Weeks    Status New    Target Date 03/05/20      PT LONG TERM GOAL #2   Title Pt will rate pain to be </=5/10 in her back    Baseline 8/10     Time 8    Period Weeks    Status New    Target Date 03/05/20      PT LONG TERM GOAL #3   Title Pt will be able to lift 25 lbs with good body mechanics for home tasks    Baseline Unable    Time 8    Period Weeks    Status New    Target Date 03/05/20      PT LONG TERM GOAL #4   Title Pt's FOTO will improve to </=39%    Baseline 60% limitation    Time 8    Period Weeks    Status New    Target Date 03/05/20                 Plan - 02/08/20 1537    Clinical Impression Statement Pt mostly aching but no pain today. Pt with improving strength and balance/proprioception. Treatment session focused on increasing pt's standing and walking tolerance as well as improving her balance. Pt tolerated treatment well but requires rest breaks due to fatigue. Pt with mild back pain after session due to initiating modified deadlifts and pt utilizing incorrect form initially -- provided heat for pt comfort at end of session.    Personal Factors and Comorbidities Age;Comorbidity 1;Comorbidity 2;Comorbidity 3+    Comorbidities DM, CHF, obesity, fibromyalgia    Examination-Activity Limitations Bed Mobility;Carry;Lift;Stand;Stairs;Squat;Sit;Reach Overhead;Locomotion Level;Bend    Examination-Participation Restrictions Cleaning;Yard Work;Laundry;Community Activity    Stability/Clinical Decision Making Evolving/Moderate complexity    Rehab Potential Good    PT Frequency 2x / week    PT Duration 6 weeks    PT Treatment/Interventions ADLs/Self Care Home Management;Cryotherapy;Electrical Stimulation;Iontophoresis 72m/ml Dexamethasone;Moist Heat;Traction;Ultrasound;Neuromuscular re-education;Therapeutic exercise;Therapeutic activities;Functional mobility training;Stair training;Gait training;Patient/family education;Manual techniques;Dry needling;Passive range of motion;Taping;Spinal Manipulations;Joint Manipulations    PT Next Visit Plan Assess response to HEP. Continue strengthening. Progress balance and  step training. Continue treadmill for endurance/walking.    PT Home Exercise  Plan Access Code: [email or web address]. Hip abduction and extension with red tband in standing    Consulted and Agree with Plan of Care Patient           Patient will benefit from skilled therapeutic intervention in order to improve the following deficits and impairments:  Difficulty walking, Decreased range of motion, Increased muscle spasms, Impaired UE functional use, Obesity, Decreased activity tolerance, Pain, Impaired flexibility, Decreased mobility, Decreased strength, Postural dysfunction  Visit Diagnosis: Mid-back pain, acute  Muscle weakness (generalized)  Decreased ROM of trunk and back  Acute midline low back pain without sciatica     Problem List Patient Active Problem List   Diagnosis Date Noted  . CHF (congestive heart failure) ([place]) 12/10/2018  . History of recurrent UTIs 06/30/2018  . Mild intermittent asthma without complication 93/57/0177  . Prediabetes 06/30/2018  . Environmental and seasonal allergies 06/30/2018  . Diabetes ([place]) 04/04/2018  . Upper respiratory infection, viral 04/04/2018  . Urinary tract infection 04/04/2018  . Vitamin D deficiency 08/07/2016  . Morbid obesity with BMI of 40.0-44.9, adult ([place]) 02/18/2016  . Extrinsic asthma without complication 93/90/3009  . SOB (shortness of breath) 06/19/2015  . S/P carpal tunnel release 07/24/2014  . S/P cubital tunnel release 07/24/2014  . Cubital tunnel syndrome, right 07/06/2014  . Right carpal tunnel syndrome 07/06/2014  . Numbness of right hand 05/21/2014  . Hypokalemia 08/16/2013  . Diabetes mellitus type 2, uncomplicated ([place]) 23/30/0762  . GERD (gastroesophageal reflux disease) 08/28/2011  . HTN (hypertension) 08/28/2011    [facility] [address] PT, DPT 02/08/2020, 5:12 PM   [facility] [address] [place], [place], [address] Phone: [phone]   Fax:   [phone]  Name: Heli Dino MRN: [id number] Date of Birth: 1951/05/03

## 2020-02-09 NOTE — Telephone Encounter (Signed)
Pt called to check on her message. She is making sure it has gotten to Dr. Volanda Napoleon since she has not heard anything back. Pt also said that a detailed message may be left on her cell phone   [phone]

## 2020-02-10 NOTE — Telephone Encounter (Signed)
Left a detailed message with pt regarding her question about COVID Vaccine booster, advised pt to call her pharmacy for more info

## 2020-02-13 ENCOUNTER — Other Ambulatory Visit: Payer: Self-pay

## 2020-02-13 ENCOUNTER — Ambulatory Visit: Payer: Medicare Other | Admitting: Physical Therapy

## 2020-02-13 ENCOUNTER — Encounter: Payer: Self-pay | Admitting: Physical Therapy

## 2020-02-13 DIAGNOSIS — M2569 Stiffness of other specified joint, not elsewhere classified: Secondary | ICD-10-CM

## 2020-02-13 DIAGNOSIS — M549 Dorsalgia, unspecified: Secondary | ICD-10-CM | POA: Diagnosis not present

## 2020-02-13 DIAGNOSIS — M6281 Muscle weakness (generalized): Secondary | ICD-10-CM

## 2020-02-13 DIAGNOSIS — M545 Low back pain, unspecified: Secondary | ICD-10-CM

## 2020-02-13 NOTE — Therapy (Signed)
[place], [place], [address] Phone: [phone]   Fax:  [phone]  Physical Therapy Treatment and Progress Note  Patient Details  Name: Beth Macias MRN: [id number] Date of Birth: 10/24/1950 Referring Provider (PT): Billie Ruddy, MD   Progress Note Reporting Period 01/09/20 to 02/13/20  See note below for Objective Data and Assessment of Progress/Goals.      Encounter Date: 02/13/2020   PT End of Session - 02/13/20 1459    Visit Number 10    Number of Visits 12    Authorization Type MEDICARE PART A AND B    Progress Note Due on Visit 10    PT Start Time 1452    PT Stop Time 1535    PT Time Calculation (min) 43 min    Activity Tolerance Patient tolerated treatment well    Behavior During Therapy [facility] for tasks assessed/performed           Past Medical History:  Diagnosis Date  . Asthma   . Blood in stool   . Colon polyps   . Diabetes mellitus without complication ([place])   . Diverticulitis   . Fibromyalgia   . GERD (gastroesophageal reflux disease)   . Gout   . Hypercholesteremia   . Hypertension   . Pneumonia   . Pneumonia   . UTI (urinary tract infection)     Past Surgical History:  Procedure Laterality Date  . APPENDECTOMY    . CESAREAN SECTION     x 3  . LIPOMA EXCISION    . MENISCUS REPAIR Left   . OVARIAN CYST REMOVAL    . OVARIAN CYST SURGERY      There were no vitals filed for this visit.   Subjective Assessment - 02/13/20 1457    Subjective Pt states she felt very sore from last session. Pt notes that she had to take pain medication and using heat. Pt notes that her glutes continue to be sore today.    Pertinent History Pt reports PT for rotator cuff deterioration 1.5 years ago; Hx of carpal and cubital tunnel releases    Limitations Lifting;Walking;Standing;House hold activities;Writing    Diagnostic tests No post accident diagnostic imaging  completed    Patient Stated Goals For my pain to decrease and to be active with greater ease    Currently in Pain? Yes    Pain Score 8     Pain Onset More than a month ago                             [facility] Adult PT Treatment/Exercise - 02/13/20 0001      Lumbar Exercises: Stretches   Single Knee to Chest Stretch Right;Left;30 seconds    Piriformis Stretch Right;Left;2 reps;30 seconds    Other Lumbar Stretch Exercise glute stretch with towel x 30 sec bilat      Lumbar Exercises: Aerobic   Tread Mill 1.6 mph x 6 min      Lumbar Exercises: Supine   Ab Set 10 reps;2 seconds    AB Set Limitations transversus abdominus set x10 reps    Other Supine Lumbar Exercises supine march with glute stretch 2x10    Other Supine Lumbar Exercises black pball under ankle & roll in/out x10      Knee/Hip Exercises: Standing   Other Standing Knee Exercises on airex: feet together eyes closed 2x30 sec, tandem stance x30 sec  bilat, tandem stance with head nods up/down & side to side x30 sec each, tandem stance eyes closed 2x30 sec, eyes closed with head turns horizontal & vertical x30 sec each                    PT Short Term Goals - 02/01/20 1612      PT SHORT TERM GOAL #1   Title Pt will be independent with initial HEP    Baseline Newly provided    Time 4    Period Weeks    Status Partially Met    Target Date 02/06/20      PT SHORT TERM GOAL #2   Title Pt will be independent with self management of trigger points and muscle tightness    Baseline Decreased understanding    Time 4    Period Weeks    Status Partially Met    Target Date 02/06/20      PT SHORT TERM GOAL #3   Title Pt will be able to perform 10 squats to demonstrate improved functional strength of bilat LEs    Time 4    Period Weeks    Status Achieved    Target Date 02/06/20             PT Long Term Goals - 01/09/20 1531      PT LONG TERM GOAL #1   Title Pt will be able to ascend/descend at  least 20 steps with step over step pattern and SBA    Baseline Unable    Time 8    Period Weeks    Status New    Target Date 03/05/20      PT LONG TERM GOAL #2   Title Pt will rate pain to be </=5/10 in her back    Baseline 8/10    Time 8    Period Weeks    Status New    Target Date 03/05/20      PT LONG TERM GOAL #3   Title Pt will be able to lift 25 lbs with good body mechanics for home tasks    Baseline Unable    Time 8    Period Weeks    Status New    Target Date 03/05/20      PT LONG TERM GOAL #4   Title Pt's FOTO will improve to </=39%    Baseline 60% limitation    Time 8    Period Weeks    Status New    Target Date 03/05/20                 Plan - 02/13/20 1523    Clinical Impression Statement Pt very sore in the glutes. Treatment focused on gentle stretching & ROM. Progressed core strengthening instead and balance. Pt tolerated treatment well.    Personal Factors and Comorbidities Age;Comorbidity 1;Comorbidity 2;Comorbidity 3+    Comorbidities DM, CHF, obesity, fibromyalgia    Examination-Activity Limitations Bed Mobility;Carry;Lift;Stand;Stairs;Squat;Sit;Reach Overhead;Locomotion Level;Bend    Examination-Participation Restrictions Cleaning;Yard Work;Laundry;Community Activity    Stability/Clinical Decision Making Evolving/Moderate complexity    Rehab Potential Good    PT Frequency 2x / week    PT Duration 6 weeks    PT Treatment/Interventions ADLs/Self Care Home Management;Cryotherapy;Electrical Stimulation;Iontophoresis 25m/ml Dexamethasone;Moist Heat;Traction;Ultrasound;Neuromuscular re-education;Therapeutic exercise;Therapeutic activities;Functional mobility training;Stair training;Gait training;Patient/family education;Manual techniques;Dry needling;Passive range of motion;Taping;Spinal Manipulations;Joint Manipulations    PT Next Visit Plan Assess response to HEP. Continue strengthening. Progress balance and step training. Continue treadmill for  endurance/walking.  PT Home Exercise Plan Access Code: [email or web address]. Hip abduction and extension with red tband in standing    Consulted and Agree with Plan of Care Patient           Patient will benefit from skilled therapeutic intervention in order to improve the following deficits and impairments:  Difficulty walking, Decreased range of motion, Increased muscle spasms, Impaired UE functional use, Obesity, Decreased activity tolerance, Pain, Impaired flexibility, Decreased mobility, Decreased strength, Postural dysfunction  Visit Diagnosis: Mid-back pain, acute  Muscle weakness (generalized)  Decreased ROM of trunk and back  Acute midline low back pain without sciatica     Problem List Patient Active Problem List   Diagnosis Date Noted  . CHF (congestive heart failure) ([place]) 12/10/2018  . History of recurrent UTIs 06/30/2018  . Mild intermittent asthma without complication 30/14/1597  . Prediabetes 06/30/2018  . Environmental and seasonal allergies 06/30/2018  . Diabetes ([place]) 04/04/2018  . Upper respiratory infection, viral 04/04/2018  . Urinary tract infection 04/04/2018  . Vitamin D deficiency 08/07/2016  . Morbid obesity with BMI of 40.0-44.9, adult ([employer]) 02/18/2016  . Extrinsic asthma without complication 33/04/5086  . SOB (shortness of breath) 06/19/2015  . S/P carpal tunnel release 07/24/2014  . S/P cubital tunnel release 07/24/2014  . Cubital tunnel syndrome, right 07/06/2014  . Right carpal tunnel syndrome 07/06/2014  . Numbness of right hand 05/21/2014  . Hypokalemia 08/16/2013  . Diabetes mellitus type 2, uncomplicated ([place]) 19/94/1290  . GERD (gastroesophageal reflux disease) 08/28/2011  . HTN (hypertension) 08/28/2011    [facility] [address] PT, DPT 02/13/2020, 4:03 PM  [facility] [address] [place], [place], [address] Phone: [phone]   Fax:  [phone]  Name: Beth Macias MRN: [id number] Date of Birth: [birth year]

## 2020-02-16 ENCOUNTER — Other Ambulatory Visit: Payer: Self-pay

## 2020-02-16 ENCOUNTER — Ambulatory Visit: Payer: Medicare Other | Admitting: Physical Therapy

## 2020-02-16 ENCOUNTER — Encounter: Payer: Self-pay | Admitting: Physical Therapy

## 2020-02-16 DIAGNOSIS — M545 Low back pain, unspecified: Secondary | ICD-10-CM

## 2020-02-16 DIAGNOSIS — M549 Dorsalgia, unspecified: Secondary | ICD-10-CM

## 2020-02-16 DIAGNOSIS — M6281 Muscle weakness (generalized): Secondary | ICD-10-CM

## 2020-02-16 DIAGNOSIS — M2569 Stiffness of other specified joint, not elsewhere classified: Secondary | ICD-10-CM

## 2020-02-16 NOTE — Therapy (Signed)
[place], [place], [address] Phone: [phone]   Fax:  [phone]  Physical Therapy Treatment  Patient Details  Name: Beth Macias MRN: [id number] Date of Birth: 1950-10-04 Referring Provider (PT): Billie Ruddy, MD   Encounter Date: 02/16/2020   PT End of Session - 02/16/20 1527    Visit Number 11    Number of Visits --    Date for PT Re-Evaluation 03/05/20    Authorization Type MEDICARE PART A AND B -- needs 12th visit FOTO    Progress Note Due on Visit 10    PT Start Time 1454    PT Stop Time 1533    PT Time Calculation (min) 39 min    Activity Tolerance Patient tolerated treatment well    Behavior During Therapy [name] for tasks assessed/performed           Past Medical History:  Diagnosis Date  . Asthma   . Blood in stool   . Colon polyps   . Diabetes mellitus without complication ([place])   . Diverticulitis   . Fibromyalgia   . GERD (gastroesophageal reflux disease)   . Gout   . Hypercholesteremia   . Hypertension   . Pneumonia   . Pneumonia   . UTI (urinary tract infection)     Past Surgical History:  Procedure Laterality Date  . APPENDECTOMY    . CESAREAN SECTION     x 3  . LIPOMA EXCISION    . MENISCUS REPAIR Left   . OVARIAN CYST REMOVAL    . OVARIAN CYST SURGERY      There were no vitals filed for this visit.   Subjective Assessment - 02/16/20 1512    Subjective Pt states she is feeling no pain today. She reports that the last 2 days prevoius she was hurting a lot due to the weather.    Pertinent History Pt reports PT for rotator cuff deterioration 1.5 years ago; Hx of carpal and cubital tunnel releases    Limitations Lifting;Walking;Standing;House hold activities;Writing    Diagnostic tests No post accident diagnostic imaging completed    Patient Stated Goals For my pain to decrease and to be active with greater ease    Currently in Pain? No/denies     Pain Onset More than a month ago                             [facility] Adult PT Treatment/Exercise - 02/16/20 0001      Lumbar Exercises: Aerobic   Tread Mill 1.7 mph x 5 min   Limited due to feeling overheated     Lumbar Exercises: Machines for Strengthening   Leg Press 3x10 @ 35#      Lumbar Exercises: Standing   Other Standing Lumbar Exercises deadlift 2x5 with 5#    Other Standing Lumbar Exercises modified deadlift butt against wall x10                    PT Short Term Goals - 02/01/20 1612      PT SHORT TERM GOAL #1   Title Pt will be independent with initial HEP    Baseline Newly provided    Time 4    Period Weeks    Status Partially Met    Target Date 02/06/20      PT SHORT TERM GOAL #2   Title Pt will be independent with  self management of trigger points and muscle tightness    Baseline Decreased understanding    Time 4    Period Weeks    Status Partially Met    Target Date 02/06/20      PT SHORT TERM GOAL #3   Title Pt will be able to perform 10 squats to demonstrate improved functional strength of bilat LEs    Time 4    Period Weeks    Status Achieved    Target Date 02/06/20             PT Long Term Goals - 01/09/20 1531      PT LONG TERM GOAL #1   Title Pt will be able to ascend/descend at least 20 steps with step over step pattern and SBA    Baseline Unable    Time 8    Period Weeks    Status New    Target Date 03/05/20      PT LONG TERM GOAL #2   Title Pt will rate pain to be </=5/10 in her back    Baseline 8/10    Time 8    Period Weeks    Status New    Target Date 03/05/20      PT LONG TERM GOAL #3   Title Pt will be able to lift 25 lbs with good body mechanics for home tasks    Baseline Unable    Time 8    Period Weeks    Status New    Target Date 03/05/20      PT LONG TERM GOAL #4   Title Pt's FOTO will improve to </=39%    Baseline 60% limitation    Time 8    Period Weeks    Status New     Target Date 03/05/20                 Plan - 02/16/20 1531    Clinical Impression Statement Pt with limited endurance walking due to feelings of overheating on the treadmill. Progressed pt's bilat LE strengthening to include machines and deadlifting. Pt tolerated treatment well. Discussed lifting mechanics for deadlifts and squats to protect her back.    Personal Factors and Comorbidities Age;Comorbidity 1;Comorbidity 2;Comorbidity 3+    Comorbidities DM, CHF, obesity, fibromyalgia    Examination-Activity Limitations Bed Mobility;Carry;Lift;Stand;Stairs;Squat;Sit;Reach Overhead;Locomotion Level;Bend    Examination-Participation Restrictions Cleaning;Yard Work;Laundry;Community Activity    Stability/Clinical Decision Making Evolving/Moderate complexity    Rehab Potential Good    PT Frequency 2x / week    PT Duration 6 weeks    PT Treatment/Interventions ADLs/Self Care Home Management;Cryotherapy;Electrical Stimulation;Iontophoresis 25m/ml Dexamethasone;Moist Heat;Traction;Ultrasound;Neuromuscular re-education;Therapeutic exercise;Therapeutic activities;Functional mobility training;Stair training;Gait training;Patient/family education;Manual techniques;Dry needling;Passive range of motion;Taping;Spinal Manipulations;Joint Manipulations    PT Next Visit Plan Needs FOTO next visit. Assess response to HEP. Continue strengthening. Progress balance and step training. Continue treadmill for endurance/walking. Initiate step over step training on stairs.    PT Home Exercise Plan Access Code: [email or web address]. Hip abduction and extension with red tband in standing    Consulted and Agree with Plan of Care Patient           Patient will benefit from skilled therapeutic intervention in order to improve the following deficits and impairments:  Difficulty walking, Decreased range of motion, Increased muscle spasms, Impaired UE functional use, Obesity, Decreased activity tolerance, Pain, Impaired flexibility,  Decreased mobility, Decreased strength, Postural dysfunction  Visit Diagnosis: Mid-back pain, acute  Muscle weakness (generalized)  Decreased ROM of trunk  and back  Acute midline low back pain without sciatica     Problem List Patient Active Problem List   Diagnosis Date Noted  . CHF (congestive heart failure) ([place]) 12/10/2018  . History of recurrent UTIs 06/30/2018  . Mild intermittent asthma without complication 89/16/9450  . Prediabetes 06/30/2018  . Environmental and seasonal allergies 06/30/2018  . Diabetes ([place]) 04/04/2018  . Upper respiratory infection, viral 04/04/2018  . Urinary tract infection 04/04/2018  . Vitamin D deficiency 08/07/2016  . Morbid obesity with BMI of 40.0-44.9, adult ([place]) 02/18/2016  . Extrinsic asthma without complication 38/88/2800  . SOB (shortness of breath) 06/19/2015  . S/P carpal tunnel release 07/24/2014  . S/P cubital tunnel release 07/24/2014  . Cubital tunnel syndrome, right 07/06/2014  . Right carpal tunnel syndrome 07/06/2014  . Numbness of right hand 05/21/2014  . Hypokalemia 08/16/2013  . Diabetes mellitus type 2, uncomplicated ([place]) 34/91/7915  . GERD (gastroesophageal reflux disease) 08/28/2011  . HTN (hypertension) 08/28/2011    [facility] [address] PT, DPT 02/16/2020, 3:41 PM  [facility] [address] [place], [place], [address] Phone: [phone]   Fax:  [phone]  Name: Beth Macias MRN: [id number] Date of Birth: 1951-02-13

## 2020-02-21 NOTE — Telephone Encounter (Signed)
Spoke with Beth Macias verbalized understanding that Dr Volanda Napoleon advise Beth Macias to get [place] booster and a flu vaccine before travelling to [place].

## 2020-02-21 NOTE — Telephone Encounter (Signed)
Left a detailed message on pt voicemail regarding Dr Volanda Napoleon on getting COVID Vaccine and a flu vaccine, advised pt to call the office back

## 2020-02-21 NOTE — Telephone Encounter (Signed)
Based on pt's age and if she had the 2nd dose of the [place] vaccine greater than or equal to 6 months ago she should consider getting the booster vaccine.  Pt should also consider the influenza vaccine if she has not already had one.  [place] is requiring a negative COVID test no more than 3 days prior to travel.  There is a list of acceptable testing sites on [employer].

## 2020-02-22 ENCOUNTER — Encounter: Payer: Self-pay | Admitting: Podiatry

## 2020-02-22 ENCOUNTER — Ambulatory Visit (INDEPENDENT_AMBULATORY_CARE_PROVIDER_SITE_OTHER): Payer: Medicare Other | Admitting: Podiatry

## 2020-02-22 ENCOUNTER — Other Ambulatory Visit: Payer: Self-pay

## 2020-02-22 DIAGNOSIS — M79676 Pain in unspecified toe(s): Secondary | ICD-10-CM | POA: Diagnosis not present

## 2020-02-22 DIAGNOSIS — M109 Gout, unspecified: Secondary | ICD-10-CM | POA: Diagnosis not present

## 2020-02-22 DIAGNOSIS — B351 Tinea unguium: Secondary | ICD-10-CM

## 2020-02-22 DIAGNOSIS — E119 Type 2 diabetes mellitus without complications: Secondary | ICD-10-CM | POA: Diagnosis not present

## 2020-02-22 MED ORDER — FEBUXOSTAT 80 MG PO TABS: 1.0000 | ORAL_TABLET | Freq: Every day | ORAL | 3 refills | Status: DC

## 2020-02-22 MED ORDER — METHYLPREDNISOLONE 4 MG PO TBPK
ORAL_TABLET | ORAL | 0 refills | Status: DC
Start: 2020-02-22 — End: 2020-10-27

## 2020-02-22 NOTE — Patient Instructions (Signed)

## 2020-02-22 NOTE — Progress Notes (Signed)
She presents today chief complaint of painful elongated toenails and gout to her left foot.  Objective: Vital signs are stable she is alert and oriented x3.  Pulses are palpable.  She has mild hallux valgus deformity of the left foot with red hot swollen first metatarsophalangeal joint.  Toenails are long thick yellow dystrophic-like mycotic no open lesions or wounds.  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  Pain in limb secondary onychomycosis.  Plan: Debridement of toenails 1 through 5 bilaterally.  Refilled her Uloric 80 mg 1 tablet daily.  Her other doctor has retired so I will refill this for her.  Also I debrided her toenails 1 through 5 bilaterally.  Follow-up with her in 3 months I also injected around the joint first metatarsophalangeal joint left with Kenalog and local anesthetic.

## 2020-02-23 ENCOUNTER — Telehealth: Payer: Self-pay

## 2020-02-23 ENCOUNTER — Ambulatory Visit: Payer: Medicare Other

## 2020-02-23 DIAGNOSIS — M549 Dorsalgia, unspecified: Secondary | ICD-10-CM

## 2020-02-23 DIAGNOSIS — M545 Low back pain, unspecified: Secondary | ICD-10-CM

## 2020-02-23 DIAGNOSIS — M2569 Stiffness of other specified joint, not elsewhere classified: Secondary | ICD-10-CM

## 2020-02-23 DIAGNOSIS — M6281 Muscle weakness (generalized): Secondary | ICD-10-CM

## 2020-02-23 NOTE — Telephone Encounter (Signed)
yes

## 2020-02-23 NOTE — Therapy (Signed)
[place], [place], [address] Phone: [phone]   Fax:  [phone]  Physical Therapy Treatment/Discharge Summary  Patient Details  Name: Casandra Dallaire MRN: [id number] Date of Birth: 12-Jan-1951 Referring Provider (PT): Billie Ruddy, MD   Encounter Date: 02/23/2020   PT End of Session - 02/23/20 1817    Visit Number 12    Number of Visits 12    Authorization Type MEDICARE PART A AND B -- will need 12th visit FOTO    Progress Note Due on Visit 10    PT Start Time 4373    PT Stop Time 1532    PT Time Calculation (min) 40 min    Activity Tolerance Patient tolerated treatment well    Behavior During Therapy [facility] for tasks assessed/performed           Past Medical History:  Diagnosis Date  . Asthma   . Blood in stool   . Colon polyps   . Diabetes mellitus without complication ([place])   . Diverticulitis   . Fibromyalgia   . GERD (gastroesophageal reflux disease)   . Gout   . Hypercholesteremia   . Hypertension   . Pneumonia   . Pneumonia   . UTI (urinary tract infection)     Past Surgical History:  Procedure Laterality Date  . APPENDECTOMY    . CESAREAN SECTION     x 3  . LIPOMA EXCISION    . MENISCUS REPAIR Left   . OVARIAN CYST REMOVAL    . OVARIAN CYST SURGERY      There were no vitals filed for this visit.   Subjective Assessment - 02/23/20 1811    Subjective Pt reports she is having a good day    Patient Stated Goals For my pain to decrease and to be active with greater ease    Currently in Pain? No/denies    Pain Score 3    average pain   Pain Orientation Lower    Pain Descriptors / Indicators Aching    Pain Onset More than a month ago    Pain Frequency Occasional              [facility] PT Assessment - 02/23/20 0001      Transfers   Five time sit to stand comments  13.3                         [facility] Adult PT Treatment/Exercise -  02/23/20 0001      Lumbar Exercises: Stretches   Lower Trunk Rotation 5 reps;10 seconds    Piriformis Stretch Right;Left;2 reps;30 seconds    Gastroc Stretch Left;Right;30 seconds      Lumbar Exercises: Aerobic   Tread Mill 1.7 mph x 5 min   Limited due to feeling overheated     Lumbar Exercises: Standing   Other Standing Lumbar Exercises deadlift 5x with 15#      Knee/Hip Exercises: Standing   Hip Abduction Stengthening;Right;Left;2 sets    Hip Extension Stengthening;Right;2 sets;15 reps    Other Standing Knee Exercises Grastroc stretch 2x; 20 sec                  PT Education - 02/23/20 1813    Education Details Discussed FOTO results and rreviewed final HEP    Person(s) Educated Patient    Methods Explanation;Demonstration;Handout    Comprehension Verbalized understanding;Returned demonstration;Verbal cues required;Tactile cues required  PT Short Term Goals - 02/01/20 1612      PT SHORT TERM GOAL #1   Title Pt will be independent with initial HEP    Baseline Newly provided    Time 4    Period Weeks    Status Partially Met    Target Date 02/06/20      PT SHORT TERM GOAL #2   Title Pt will be independent with self management of trigger points and muscle tightness    Baseline Decreased understanding    Time 4    Period Weeks    Status Partially Met    Target Date 02/06/20      PT SHORT TERM GOAL #3   Title Pt will be able to perform 10 squats to demonstrate improved functional strength of bilat LEs    Time 4    Period Weeks    Status Achieved    Target Date 02/06/20             PT Long Term Goals - 02/23/20 1818      PT LONG TERM GOAL #1   Title Pt will be able to ascend/descend at least 20 steps with step over step pattern and SBA. Achieved    Baseline Unable    Status Achieved    Target Date 02/23/20      PT LONG TERM GOAL #2   Title Pt will rate pain to be </=5/10 in her back. Achieved - 3/10 as average pain    Baseline 8/10      Status Achieved    Target Date 02/23/20      PT LONG TERM GOAL #3   Title Pt will be able to lift 25 lbs with good body mechanics for home tasks. Not Met-15 lbs from 8 inch to waist height    Baseline Unable    Status Not Met    Target Date 02/23/20      PT LONG TERM GOAL #4   Title Pt's FOTO will improve to </=39%. Achieved 30% limitation    Baseline 60% limitation    Status Achieved    Target Date 02/23/20      PT LONG TERM GOAL #5   Title Improve 5x STS by 3 sec. Achieved 13.3 sec    Baseline 18.1    Status Achieved    Target Date 02/23/20      PT LONG TERM GOAL #6   Title Improve 2 min walking test by 19f Achieved - 4155f   Baseline 340 ft    Target Date 02/23/20                 Plan - 02/23/20 2001    Clinical Impression Statement Pt has made good progress with PT and she reports being pleased with pain level and improved functional abilities. See LTGs below. Today HEP was reviewed for ppt's understanding so she may continue with it at home to maintain or improve current abilities. Pt is DCed from OP services.           Patient will benefit from skilled therapeutic intervention in order to improve the following deficits and impairments:     Visit Diagnosis: No diagnosis found.     Problem List Patient Active Problem List   Diagnosis Date Noted  . CHF (congestive heart failure) ([place]07/17/2020  . History of recurrent UTIs 06/30/2018  . Mild intermittent asthma without complication 0219/37/9024. Prediabetes 06/30/2018  . Environmental and seasonal allergies 06/30/2018  . Diabetes (  [place]) 04/04/2018  . Upper respiratory infection, viral 04/04/2018  . Urinary tract infection 04/04/2018  . Vitamin D deficiency 08/07/2016  . Morbid obesity with BMI of 40.0-44.9, adult ([place]) 02/18/2016  . Extrinsic asthma without complication 73/40/3709  . SOB (shortness of breath) 06/19/2015  . S/P carpal tunnel release 07/24/2014  . S/P cubital tunnel release  07/24/2014  . Cubital tunnel syndrome, right 07/06/2014  . Right carpal tunnel syndrome 07/06/2014  . Numbness of right hand 05/21/2014  . Hypokalemia 08/16/2013  . Diabetes mellitus type 2, uncomplicated ([place]) 64/38/3818  . GERD (gastroesophageal reflux disease) 08/28/2011  . HTN (hypertension) 08/28/2011   PHYSICAL THERAPY DISCHARGE SUMMARY  Visits from Start of Care: 12  Current functional level related to goals / functional outcomes: See above   Remaining deficits: See above   Education / Equipment: HEP  Plan: Patient agrees to discharge.  Patient goals were partially met. Patient is being discharged due to being pleased with the current functional level.  ?????        Gar Ponto MS, PT 02/23/20 8:05 PM  [facility] [address] [place], [place], [address] Phone: [phone]   Fax:  [phone]  Name: Trinna Kunst MRN: [id number] Date of Birth: [birth year]

## 2020-02-23 NOTE — Telephone Encounter (Signed)
Patient stopped by office for PA for her Febuxostat 80mg  tabs.  Prior Josem Kaufmann was obtained and insurance denied stated they do not cover this medication.   She stated that she has taken Allopurinol in the past.  Can I send that in instead.  I believe that will be covered.  Please advise

## 2020-02-24 ENCOUNTER — Ambulatory Visit
Admission: RE | Admit: 2020-02-24 | Discharge: 2020-02-24 | Disposition: A | Discharge status: Home / Self Care | Payer: Medicare Other | Source: Ambulatory Visit | Attending: Family Medicine | Admitting: Family Medicine

## 2020-02-24 ENCOUNTER — Other Ambulatory Visit: Payer: Self-pay

## 2020-02-24 DIAGNOSIS — Z1231 Encounter for screening mammogram for malignant neoplasm of breast: Secondary | ICD-10-CM

## 2020-02-27 ENCOUNTER — Telehealth: Payer: Self-pay | Admitting: Cardiovascular Disease

## 2020-02-27 DIAGNOSIS — E785 Hyperlipidemia, unspecified: Secondary | ICD-10-CM

## 2020-02-27 DIAGNOSIS — R7303 Prediabetes: Secondary | ICD-10-CM

## 2020-02-27 DIAGNOSIS — E876 Hypokalemia: Secondary | ICD-10-CM

## 2020-02-27 DIAGNOSIS — I509 Heart failure, unspecified: Secondary | ICD-10-CM

## 2020-02-27 DIAGNOSIS — I420 Dilated cardiomyopathy: Secondary | ICD-10-CM

## 2020-02-27 MED ORDER — ALLOPURINOL 100 MG PO TABS: 100.0000 mg | ORAL_TABLET | Freq: Every day | ORAL | 6 refills | Status: DC

## 2020-02-27 NOTE — Telephone Encounter (Signed)
Ok to order labs? 

## 2020-02-27 NOTE — Addendum Note (Signed)
Addended by: Graceann Congress D on: 02/27/2020 05:07 PM   Modules accepted: Orders

## 2020-02-27 NOTE — Telephone Encounter (Signed)
Patient would like a note to take with her to the airport so she can get assistance with transport from one terminal to the other when she travels to [place] the middle of this month. While on the phone patient stated she would like repeat lab work as well, to make sure she is doing okay before she leaves. Made patient a virtual appointment this Friday since she is due for her 6 month follow up. Will forward to Dr. Johnsie Cancel for further advisement.

## 2020-02-27 NOTE — Telephone Encounter (Signed)
Note done. Will leave at front desk for patient to pick up.

## 2020-02-27 NOTE — Progress Notes (Signed)
Virtual Visit via Video Note   This visit type was conducted due to national recommendations for restrictions regarding the COVID-19 Pandemic (e.g. social distancing) in an effort to limit this patient's exposure and mitigate transmission in our community.  Due to her co-morbid illnesses, this patient is at least at moderate risk for complications without adequate follow up.  This format is felt to be most appropriate for this patient at this time.  All issues noted in this document were discussed and addressed.  A limited physical exam was performed with this format.  Please refer to the patient's chart for her consent to telehealth for [facility].   Date:  03/02/2020   ID:  Beth Macias, DOB 1950-06-28, MRN [id number]  Provider Location: Office  Patient Location : Home   PCP:  Billie Ruddy, MD  Cardiologist:   Johnsie Cancel Electrophysiologist:  None   Evaluation Performed:  Follow-Up Visit  Chief Complaint:  CHF  History of Present Illness:    Beth Macias is a 69 y.o. female who was first seen January 23,2020   for consultation regarding chest pain. Referred by Lennette Bihari [facility] and Jaclyn Shaggy [facility] ER  Reviewed ER visit notes from 04/07/18  Lots of URI, congestive symptoms  And urinary frequency. BP was elevated in ER Had abnormal ECG with no old one to compare  Reviewed ECG from 04/07/18 SR PVC isolated LAD and poor R wave progression no acute ST changes Troponin negative x 2 CXR with CE ligular airspace disease could Be scarring , atelectasis or early pneumonia. F/U CXR done 04/26/18 NAD.    Echo done 11/16/18 reviewed EF 30-35% with moderate MR Small to moderate sized pericardial effusion Myovue done 12/13/18 normal perfusion EF 32%   Has had issues with hives since her 30's They got worse when she was started on pulmonary and CHF meds. Not clear culprit. But improved and not thought to be from heart meds. Seen 04/01/19 pharm D and entresto dose  kept with increase in coreg. Patient does not like taking diuretic when she goes out Did not tolerate max dose Entresto   She is upset about donut hole and cost of entresto Taking melatonin to sleep Seen by pharm D  07/07/19 and coreg dose increased She did not want to start statin and did not want to increase entresto dose  She has had her COVID vaccine Feels well with functional class one   Traveling to [place] latter this month   Had gout flare in foot and had a steroid shot in left foot  Got prednisone and BP was elevated   [name] is her new grand daughter is in [place]   The patient  does not have symptoms concerning for COVID-19 infection (fever, chills, cough, or new shortness of breath).    Past Medical History:  Diagnosis Date  . Asthma   . Blood in stool   . Colon polyps   . Diabetes mellitus without complication ([place])   . Diverticulitis   . Fibromyalgia   . GERD (gastroesophageal reflux disease)   . Gout   . Hypercholesteremia   . Hypertension   . Pneumonia   . Pneumonia   . UTI (urinary tract infection)    Past Surgical History:  Procedure Laterality Date  . APPENDECTOMY    . CESAREAN SECTION     x 3  . LIPOMA EXCISION    . MENISCUS REPAIR Left   . OVARIAN CYST REMOVAL    .  OVARIAN CYST SURGERY       Current Meds  Medication Sig  . allopurinol (ZYLOPRIM) 100 MG tablet Take 1 tablet (100 mg total) by mouth daily.  . carvedilol (COREG) 25 MG tablet Take 1 tablet (25 mg total) by mouth 2 (two) times daily.  . Cholecalciferol (VITAMIN D3) 125 MCG (5000 UT) CAPS Take 1,000 Units by mouth at bedtime.   . famotidine (PEPCID) 40 MG tablet SMARTSIG:1 Tablet(s) By Mouth Every 12 Hours PRN  . glucose blood (ONETOUCH ULTRA) test strip Use to check blood sugar 4-5 times daily  . ipratropium-albuterol (DUONEB) 0.5-2.5 (3) MG/3ML SOLN Take 3 mLs by nebulization every 6 (six) hours as needed.  Marland Kitchen levocetirizine (XYZAL) 5 MG tablet Take 5 mg by mouth every evening.  .  magnesium 30 MG tablet Take 400 mg by mouth daily.  . montelukast (SINGULAIR) 10 MG tablet Take 1 tablet (10 mg total) by mouth daily. Needs office visit for future refills.  Marland Kitchen omeprazole (PRILOSEC) 40 MG capsule Take 1 capsule (40 mg total) by mouth daily.  . potassium chloride 20 MEQ/15ML (10%) SOLN Take 20 mEq by mouth daily. Takes only when taking prn torsemide  . PROAIR HFA 108 (90 Base) MCG/ACT inhaler Inhale 2 puffs into the lungs every 4 (four) hours as needed for wheezing.   . sacubitril-valsartan (ENTRESTO) 49-51 MG Take 1 tablet by mouth 2 (two) times daily.  . vitamin C (ASCORBIC ACID) 500 MG tablet Take 500 mg by mouth daily.     Allergies:   Strawberry (diagnostic), Penicillins, Tape, and Gabapentin   Social History   Tobacco Use  . Smoking status: Former Smoker    Packs/day: 0.25    Years: 9.00    Pack years: 2.25    Types: Cigarettes    Quit date: 05/26/1976    Years since quitting: 43.7  . Smokeless tobacco: Never Used  Vaping Use  . Vaping Use: Never used  Substance Use Topics  . Alcohol use: Never  . Drug use: Never     Family Hx: The patient's family history includes AAA (abdominal aortic aneurysm) in her father; Colon cancer in her maternal uncle; Crohn's disease in her paternal aunt; Diabetes in her mother; Gout in her brother and mother; Heart attack in her brother; Heart failure in her father and mother; Hypertension in her brother, father, and mother. There is no history of Breast cancer.  ROS:   Please see the history of present illness.     All other systems reviewed and are negative.   Prior CV studies:   The following studies were reviewed today:  Echo 11/16/18 Myovue 12/13/18  LE venous duplex 04/29/19   Labs/Other Tests and Data Reviewed:    EKG:  Not done today   Recent Labs: 04/29/2019: NT-Pro BNP 6,097 06/29/2019: Hemoglobin 14.2; Platelets 256.0 02/29/2020: ALT 12; BUN 21; Creatinine, Ser 0.80; Magnesium 1.9; Potassium 3.6; Sodium 144    Recent Lipid Panel Lab Results  Component Value Date/Time   CHOL 191 02/29/2020 10:10 AM   TRIG 131 02/29/2020 10:10 AM   HDL 59 02/29/2020 10:10 AM   CHOLHDL 3.2 02/29/2020 10:10 AM   CHOLHDL 4 06/29/2019 10:49 AM   LDLCALC 109 (H) 02/29/2020 10:10 AM    Wt Readings from Last 3 Encounters:  03/02/20 220 lb 3.2 oz (99.9 kg)  10/06/19 220 lb (99.8 kg)  07/26/19 221 lb (100.2 kg)     Objective:    Vital Signs:  BP (!) 185/98   Pulse  63   Wt 220 lb 3.2 oz (99.9 kg)   BMI 43.00 kg/m    Elderly female No distress No JVP elevation No tachypnea    ASSESSMENT & PLAN:    CHF:  Non ischemic DCM continue coreg, and entresto On demedex ( hives when on lasix/aldactone) she takes her diuretic when not going out for the day  No changes for now will update echo when she comes back from [place]   Pulmonary:  F/u Wert continue symbicort and pro Air  LE venous duplex 04/29/19 showed no DVT  GERD:  Continue prilosec   COVID-19 Education: The signs and symptoms of COVID-19 were discussed with the patient and how to seek care for testing (follow up with PCP or arrange E-visit).  The importance of social distancing was discussed today.   Medication Adjustments/Labs and Tests Ordered: Current medicines are reviewed at length with the patient today.  Concerns regarding medicines are outlined above.   Tests Ordered:  Echo for cardiomyopathy   Medication Changes:  None    Signed, Jenkins Rouge, MD  03/02/2020 8:45 AM    [place]

## 2020-02-27 NOTE — Telephone Encounter (Signed)
Ordered labs for patient. Patient will come in on 02/29/20 for lab work.

## 2020-02-27 NOTE — Telephone Encounter (Signed)
[place] for me to order? Looks like you are seeing her virtual on the 8th. I haven't seen her since Feb.

## 2020-02-27 NOTE — Telephone Encounter (Signed)
Error

## 2020-02-27 NOTE — Telephone Encounter (Signed)
Ok to write note

## 2020-02-27 NOTE — Telephone Encounter (Signed)
Allopurinol has been sent to [address].  Patient has been notified of rx

## 2020-02-27 NOTE — Telephone Encounter (Signed)
Patient called stating that she is going to be traveling and would like a letter stating that she may require assistance at the airport as she has CHF, and when she walks lost distances she gets SOB and weak.

## 2020-02-27 NOTE — Telephone Encounter (Signed)
Patient would like to have label work done to check her sugar, magnesium, and potassium.

## 2020-02-27 NOTE — Addendum Note (Signed)
Addended by: Graceann Congress D on: 02/27/2020 10:01 AM   Modules accepted: Orders

## 2020-02-29 ENCOUNTER — Other Ambulatory Visit: Payer: Self-pay

## 2020-02-29 ENCOUNTER — Other Ambulatory Visit: Payer: Medicare Other | Admitting: *Deleted

## 2020-02-29 DIAGNOSIS — E876 Hypokalemia: Secondary | ICD-10-CM

## 2020-02-29 DIAGNOSIS — R7303 Prediabetes: Secondary | ICD-10-CM

## 2020-02-29 DIAGNOSIS — I509 Heart failure, unspecified: Secondary | ICD-10-CM

## 2020-02-29 DIAGNOSIS — I420 Dilated cardiomyopathy: Secondary | ICD-10-CM

## 2020-02-29 DIAGNOSIS — E785 Hyperlipidemia, unspecified: Secondary | ICD-10-CM

## 2020-03-01 LAB — COMPREHENSIVE METABOLIC PANEL
ALT: 12 IU/L (ref 0–32)
AST: 12 IU/L (ref 0–40)
Albumin/Globulin Ratio: 1.4 (ref 1.2–2.2)
Albumin: 3.9 g/dL (ref 3.8–4.8)
Alkaline Phosphatase: 79 IU/L (ref 44–121)
BUN/Creatinine Ratio: 26 (ref 12–28)
BUN: 21 mg/dL (ref 8–27)
Bilirubin Total: 0.7 mg/dL (ref 0.0–1.2)
CO2: 30 mmol/L — ABNORMAL HIGH (ref 20–29)
Calcium: 9.8 mg/dL (ref 8.7–10.3)
Chloride: 100 mmol/L (ref 96–106)
Creatinine, Ser: 0.8 mg/dL (ref 0.57–1.00)
GFR calc Af Amer: 86 mL/min/{1.73_m2} (ref >59)
GFR calc non Af Amer: 75 mL/min/{1.73_m2} (ref >59)
Globulin, Total: 2.7 g/dL (ref 1.5–4.5)
Glucose: 128 mg/dL — ABNORMAL HIGH (ref 65–99)
Potassium: 3.6 mmol/L (ref 3.5–5.2)
Sodium: 144 mmol/L (ref 134–144)
Total Protein: 6.6 g/dL (ref 6.0–8.5)

## 2020-03-01 LAB — LIPID PANEL
Chol/HDL Ratio: 3.2 ratio (ref 0.0–4.4)
Cholesterol, Total: 191 mg/dL (ref 100–199)
HDL: 59 mg/dL (ref >39)
LDL Chol Calc (NIH): 109 mg/dL — ABNORMAL HIGH (ref 0–99)
Triglycerides: 131 mg/dL (ref 0–149)
VLDL Cholesterol Cal: 23 mg/dL (ref 5–40)

## 2020-03-01 LAB — MAGNESIUM: Magnesium: 1.9 mg/dL (ref 1.6–2.3)

## 2020-03-01 LAB — HEMOGLOBIN A1C
Est. average glucose Bld gHb Est-mCnc: 157 mg/dL
Hgb A1c MFr Bld: 7.1 % — ABNORMAL HIGH (ref 4.8–5.6)

## 2020-03-02 ENCOUNTER — Other Ambulatory Visit: Payer: Self-pay

## 2020-03-02 ENCOUNTER — Telehealth: Payer: Self-pay | Admitting: *Deleted

## 2020-03-02 ENCOUNTER — Telehealth (INDEPENDENT_AMBULATORY_CARE_PROVIDER_SITE_OTHER): Payer: Medicare Other | Admitting: Cardiovascular Disease

## 2020-03-02 VITALS — BP 185/98 | HR 63 | Wt 220.2 lb

## 2020-03-02 DIAGNOSIS — I420 Dilated cardiomyopathy: Secondary | ICD-10-CM

## 2020-03-02 NOTE — Telephone Encounter (Signed)
  Patient Consent for Virtual Visit         Beth Macias has provided verbal consent on 03/02/2020 for a virtual visit (video or telephone).   CONSENT FOR VIRTUAL VISIT FOR:  Beth Macias  By participating in this virtual visit I agree to the following:  I hereby voluntarily request, consent and authorize [facility] and its employed or contracted physicians, physician assistants, nurse practitioners or other licensed health care professionals (the Practitioner), to provide me with telemedicine health care services (the "Services") as deemed necessary by the treating Practitioner. I acknowledge and consent to receive the Services by the Practitioner via telemedicine. I understand that the telemedicine visit will involve communicating with the Practitioner through live audiovisual communication technology and the disclosure of certain medical information by electronic transmission. I acknowledge that I have been given the opportunity to request an in-person assessment or other available alternative prior to the telemedicine visit and am voluntarily participating in the telemedicine visit.  I understand that I have the right to withhold or withdraw my consent to the use of telemedicine in the course of my care at any time, without affecting my right to future care or treatment, and that the Practitioner or I may terminate the telemedicine visit at any time. I understand that I have the right to inspect all information obtained and/or recorded in the course of the telemedicine visit and may receive copies of available information for a reasonable fee.  I understand that some of the potential risks of receiving the Services via telemedicine include:  Marland Kitchen Delay or interruption in medical evaluation due to technological equipment failure or disruption; . Information transmitted may not be sufficient (e.g. poor resolution of images) to allow for appropriate medical  decision making by the Practitioner; and/or  . In rare instances, security protocols could fail, causing a breach of personal health information.  Furthermore, I acknowledge that it is my responsibility to provide information about my medical history, conditions and care that is complete and accurate to the best of my ability. I acknowledge that Practitioner's advice, recommendations, and/or decision may be based on factors not within their control, such as incomplete or inaccurate data provided by me or distortions of diagnostic images or specimens that may result from electronic transmissions. I understand that the practice of medicine is not an exact science and that Practitioner makes no warranties or guarantees regarding treatment outcomes. I acknowledge that a copy of this consent can be made available to me via my patient portal ([facility]), or I can request a printed copy by calling the office of [facility].    I understand that my insurance will be billed for this visit.   I have read or had this consent read to me. . I understand the contents of this consent, which adequately explains the benefits and risks of the Services being provided via telemedicine.  . I have been provided ample opportunity to ask questions regarding this consent and the Services and have had my questions answered to my satisfaction. . I give my informed consent for the services to be provided through the use of telemedicine in my medical care

## 2020-03-02 NOTE — Patient Instructions (Signed)
Medication Instructions:  No changes *If you need a refill on your cardiac medications before your next appointment, please call your pharmacy*   Lab Work: none If you have labs (blood work) drawn today and your tests are completely normal, you will receive your results only by: Marland Kitchen MyChart Message (if you have MyChart) OR . A paper copy in the mail If you have any lab test that is abnormal or we need to change your treatment, we will call you to review the results.   Testing/Procedures: Your physician has requested that you have an echocardiogram. Echocardiography is a painless test that uses sound waves to create images of your heart. It provides your doctor with information about the size and shape of your heart and how well your heart's chambers and valves are working. This procedure takes approximately one hour. There are no restrictions for this procedure.   Follow-Up: At [facility], you and your health needs are our priority.  As part of our continuing mission to provide you with exceptional heart care, we have created designated Provider Care Teams.  These Care Teams include your primary Cardiologist (physician) and Advanced Practice Providers (APPs -  Physician Assistants and Nurse Practitioners) who all work together to provide you with the care you need, when you need it.   Your next appointment:   3 month(s)  The format for your next appointment:   In Person  Provider:   Jenkins Rouge, MD   Other Instructions

## 2020-03-02 NOTE — Addendum Note (Signed)
Addended by: Rodman Key on: 03/02/2020 10:35 AM   Modules accepted: Orders

## 2020-03-29 ENCOUNTER — Telehealth: Payer: Self-pay | Admitting: Family Medicine

## 2020-03-29 ENCOUNTER — Other Ambulatory Visit: Payer: Self-pay

## 2020-03-29 NOTE — Telephone Encounter (Signed)
Pt is calling in to update her new pharmacy she will be using [facility], [place] [id number] [phone] starting today 03/29/2020.

## 2020-03-29 NOTE — Telephone Encounter (Signed)
Pt pharmacy is already updated on pt chart

## 2020-03-30 ENCOUNTER — Other Ambulatory Visit (HOSPITAL_COMMUNITY): Payer: Medicare Other

## 2020-05-14 ENCOUNTER — Telehealth: Payer: Self-pay | Admitting: Pulmonary Disease

## 2020-05-14 DIAGNOSIS — K219 Gastro-esophageal reflux disease without esophagitis: Secondary | ICD-10-CM

## 2020-05-15 MED ORDER — OMEPRAZOLE 40 MG PO CPDR: 40.0000 mg | DELAYED_RELEASE_CAPSULE | Freq: Every day | ORAL | 6 refills | Status: DC

## 2020-05-15 NOTE — Telephone Encounter (Signed)
Called and spoke with pharmacy to see if they received electronic RX for medication. They confirmed they did get it and will start processing it. Nothing further needed at this time.

## 2020-05-23 ENCOUNTER — Telehealth: Payer: Self-pay | Admitting: Cardiovascular Disease

## 2020-05-23 ENCOUNTER — Other Ambulatory Visit: Payer: Self-pay

## 2020-05-23 ENCOUNTER — Ambulatory Visit (HOSPITAL_COMMUNITY): Payer: Medicare Other | Attending: Internal Medicine

## 2020-05-23 DIAGNOSIS — I420 Dilated cardiomyopathy: Secondary | ICD-10-CM

## 2020-05-23 LAB — ECHOCARDIOGRAM COMPLETE
Area-P 1/2: 2.32 cm2
S' Lateral: 3.4 cm

## 2020-05-23 MED ORDER — TORSEMIDE 20 MG PO TABS: 20.0000 mg | ORAL_TABLET | Freq: Every day | ORAL | 3 refills | Status: DC

## 2020-05-23 MED ORDER — CARVEDILOL 25 MG PO TABS: 25.0000 mg | ORAL_TABLET | Freq: Two times a day (BID) | ORAL | 3 refills | Status: DC

## 2020-05-23 NOTE — Telephone Encounter (Signed)
Sent in refills for carvedilol and torsemide to patient's pharmacy of choice.

## 2020-05-23 NOTE — Addendum Note (Signed)
Addended by: Virl Axe, Janalyn Higby L on: 05/23/2020 05:15 PM   Modules accepted: Orders

## 2020-05-23 NOTE — Telephone Encounter (Signed)
Pt is requesting that current and future prescriptions be sent to: [name] [address] [place], [place] [address].  Pt is also requesting refills for Carvedilol 25 mg and Torsemide 20 mg to be filled at new pharmacy (per patient Please DO NOT send refills to OLD pharmacy on [address])

## 2020-05-28 ENCOUNTER — Ambulatory Visit: Payer: Medicare Other | Admitting: Podiatry

## 2020-05-28 ENCOUNTER — Telehealth: Payer: Self-pay

## 2020-05-28 DIAGNOSIS — I420 Dilated cardiomyopathy: Secondary | ICD-10-CM

## 2020-05-28 NOTE — Telephone Encounter (Signed)
Called patient about ordering another echocardiogram. Patient stated that results of her last echocardiogram were not good probably due to her experience with the echo tech. Patient stated she felt very uncomfortable and had a bad experience with this ultrasound tech before. Will send message to ultra sound manager about the experience the patient had. Place notes in patient's chart that she only wants female techs to do her echocardiogram.

## 2020-05-28 NOTE — Telephone Encounter (Signed)
-----   Message from Wendall Stade, MD sent at 05/28/2020 10:44 AM EST ----- Regarding: RE: Not Tamponade F/u echo and PA in 4 weeks  ----- Message ----- From: Christell Constant, MD Sent: 05/23/2020   5:24 PM EST To: Wendall Stade, MD Subject: Not Tamponade                                  Hey- wanted to close the loop on Ms. G.  I think her LV function and valvular regurgitation has significantly improved; but her effusion hasn't.  No IVC collapse or inflow issues, but it takes the RV a while to fully open up against the effusion.  It might be reasonable to re-image her in a sooner timer interval if you agree; happy to talk further. Thanks, [employer]

## 2020-05-31 ENCOUNTER — Telehealth: Payer: Self-pay

## 2020-05-31 NOTE — Telephone Encounter (Signed)
Received patient assistance form for [name] on her Sherryll Burger. Completed form and faxed to [employer] number provided.

## 2020-06-01 DIAGNOSIS — M25512 Pain in left shoulder: Secondary | ICD-10-CM | POA: Diagnosis not present

## 2020-06-04 NOTE — Progress Notes (Signed)
Date:  06/06/2020   ID:  Beth Macias, DOB 02-05-1951, MRN [id number]  PCP:  Beth Ruddy, MD  Cardiologist:   Beth Macias Electrophysiologist:  None   Evaluation Performed:  Follow-Up Visit  Chief Complaint:  CHF  History of Present Illness:    Beth Macias is a 70 y.o. 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     Echo done 11/16/18 reviewed EF 30-35% with moderate MR Small to moderate sized pericardial effusion Myovue done 12/13/18 normal perfusion EF 32%   Has had issues with hives since her 30's They got worse when she was started on pulmonary and CHF meds. Not clear culprit. But improved and not thought to be from heart meds. Seen 04/01/19 pharm D and entresto dose kept with increase in coreg. Patient does not like taking diuretic when she goes out Did not tolerate max dose Entresto   TTE done 05/23/20 improved EF to 50-55% with moderate Pericardial effusion no tamponade MR only trivial   She has had her COVID vaccine Feels well with functional class one   Beth Macias is her new grand daughter is in [place]   Had nice trip to [place] but ate too much and weight is up   The patient  does not have symptoms concerning for COVID-19 infection (fever, chills, cough, or new shortness of breath).    Past Medical History:  Diagnosis Date  . Asthma   . Blood in stool   . Colon polyps   . Diabetes mellitus without complication ([place])   . Diverticulitis   . Fibromyalgia   . GERD (gastroesophageal reflux disease)   . Gout   . Hypercholesteremia   . Hypertension   . Pneumonia   . Pneumonia   . UTI (urinary tract infection)    Past Surgical History:  Procedure Laterality Date  . APPENDECTOMY    . CESAREAN SECTION     x 3  . LIPOMA EXCISION    . MENISCUS REPAIR Left   . OVARIAN CYST REMOVAL    . OVARIAN CYST SURGERY       Current Meds  Medication Sig  . allopurinol (ZYLOPRIM) 100 MG tablet Take 1 tablet (100 mg total) by  mouth daily.  . carvedilol (COREG) 25 MG tablet Take 1 tablet (25 mg total) by mouth 2 (two) times daily.  . Cholecalciferol (VITAMIN D3) 125 MCG (5000 UT) CAPS Take 1,000 Units by mouth at bedtime.   . famotidine (PEPCID) 40 MG tablet SMARTSIG:1 Tablet(s) By Mouth Every 12 Hours PRN  . Febuxostat 80 MG TABS Take 1 tablet (80 mg total) by mouth daily.  Marland Kitchen glucose blood (ONETOUCH ULTRA) test strip Use to check blood sugar 4-5 times daily  . ipratropium-albuterol (DUONEB) 0.5-2.5 (3) MG/3ML SOLN Take 3 mLs by nebulization every 6 (six) hours as needed.  Marland Kitchen levocetirizine (XYZAL) 5 MG tablet Take 5 mg by mouth every evening.  . magnesium 30 MG tablet Take 400 mg by mouth daily.  . methylPREDNISolone (MEDROL DOSEPAK) 4 MG TBPK tablet 6 day dose pack - take as directed  . montelukast (SINGULAIR) 10 MG tablet Take 1 tablet (10 mg total) by mouth daily. Needs office visit for future refills.  Marland Kitchen omeprazole (PRILOSEC) 40 MG capsule Take 1 capsule (40 mg total) by mouth daily.  . potassium chloride 20 MEQ/15ML (10%) SOLN Take 20 mEq by mouth daily. Takes only when taking prn torsemide  . PROAIR HFA 108 (90 Base) MCG/ACT inhaler  Inhale 2 puffs into the lungs every 4 (four) hours as needed for wheezing.   . sacubitril-valsartan (ENTRESTO) 49-51 MG Take 1 tablet by mouth 2 (two) times daily.  Marland Kitchen torsemide (DEMADEX) 20 MG tablet Take 1 tablet (20 mg total) by mouth daily.  . vitamin C (ASCORBIC ACID) 500 MG tablet Take 500 mg by mouth daily.     Allergies:   Strawberry (diagnostic), Penicillins, Tape, and Gabapentin   Social History   Tobacco Use  . Smoking status: Former Smoker    Packs/day: 0.25    Years: 9.00    Pack years: 2.25    Types: Cigarettes    Quit date: 05/26/1976    Years since quitting: 44.0  . Smokeless tobacco: Never Used  Vaping Use  . Vaping Use: Never used  Substance Use Topics  . Alcohol use: Never  . Drug use: Never     Family Hx: The patient's family history includes AAA  (abdominal aortic aneurysm) in her father; Colon cancer in her maternal uncle; Crohn's disease in her paternal aunt; Diabetes in her mother; Gout in her brother and mother; Heart attack in her brother; Heart failure in her father and mother; Hypertension in her brother, father, and mother. There is no history of Breast cancer.  ROS:   Please see the history of present illness.     All other systems reviewed and are negative.   Prior CV studies:   The following studies were reviewed today:  Echo 11/16/18 Echo 05/23/20  Myovue 12/13/18  LE venous duplex 04/29/19   Labs/Other Tests and Data Reviewed:    EKG:  06/06/2020 SR rate 65 poor R wave progression   Recent Labs: 06/29/2019: Hemoglobin 14.2; Platelets 256.0 02/29/2020: ALT 12; BUN 21; Creatinine, Ser 0.80; Magnesium 1.9; Potassium 3.6; Sodium 144   Recent Lipid Panel Lab Results  Component Value Date/Time   CHOL 191 02/29/2020 10:10 AM   TRIG 131 02/29/2020 10:10 AM   HDL 59 02/29/2020 10:10 AM   CHOLHDL 3.2 02/29/2020 10:10 AM   CHOLHDL 4 06/29/2019 10:49 AM   LDLCALC 109 (H) 02/29/2020 10:10 AM    Wt Readings from Last 3 Encounters:  06/06/20 103.9 kg  03/02/20 99.9 kg  10/06/19 99.8 kg     Objective:    Vital Signs:  BP (!) 150/86   Pulse 65   Ht 5\' 1"  (1.549 m)   Wt 103.9 kg   SpO2 97%   BMI 43.27 kg/m    Affect appropriate Healthy:  appears stated age HEENT: normal Neck supple with no adenopathy JVP normal no bruits no thyromegaly Lungs clear with no wheezing and good diaphragmatic motion Heart:  S1/S2 no murmur, no rub, gallop or click PMI normal Abdomen: benighn, BS positve, no tenderness, no AAA no bruit.  No HSM or HJR Distal pulses intact with no bruits No edema Neuro non-focal Skin warm and dry No muscular weakness    ASSESSMENT & PLAN:    CHF:  Non ischemic DCM continue coreg, and entresto On demedex ( hives when on lasix/aldactone) she takes her diuretic when not going out for the day   No changes for now EF improved by TTE 05/23/20 50-55%  Pulmonary:  F/u Wert continue symbicort and pro Air  LE venous duplex 04/29/19 showed no DVT  GERD:  Continue prilosec   Pericardial Effusion:  Asymptomatic f/u echo as needed and in a year if no symptoms for DCM   COVID-19 Education: The signs and symptoms of COVID-19 were  discussed with the patient and how to seek care for testing (follow up with PCP or arrange E-visit).  The importance of social distancing was discussed today.   Medication Adjustments/Labs and Tests Ordered: Current medicines are reviewed at length with the patient today.  Concerns regarding medicines are outlined above.   Tests Ordered:  None   Medication Changes:  None   F/U in a year   Signed, Jenkins Rouge, MD  06/06/2020 10:19 AM    [place]

## 2020-06-06 ENCOUNTER — Ambulatory Visit (INDEPENDENT_AMBULATORY_CARE_PROVIDER_SITE_OTHER): Payer: Medicare Other | Admitting: Cardiovascular Disease

## 2020-06-06 ENCOUNTER — Encounter: Payer: Self-pay | Admitting: Cardiovascular Disease

## 2020-06-06 ENCOUNTER — Other Ambulatory Visit: Payer: Self-pay

## 2020-06-06 VITALS — BP 150/86 | HR 65 | Ht 61.0 in | Wt 229.0 lb

## 2020-06-06 DIAGNOSIS — I5022 Chronic systolic (congestive) heart failure: Secondary | ICD-10-CM

## 2020-06-06 DIAGNOSIS — I313 Pericardial effusion (noninflammatory): Secondary | ICD-10-CM

## 2020-06-06 DIAGNOSIS — I3139 Other pericardial effusion (noninflammatory): Secondary | ICD-10-CM

## 2020-06-06 NOTE — Patient Instructions (Addendum)
Medication Instructions:  *If you need a refill on your cardiac medications before your next appointment, please call your pharmacy*  Lab Work: If you have labs (blood work) drawn today and your tests are completely normal, you will receive your results only by: . MyChart Message (if you have MyChart) OR . A paper copy in the mail If you have any lab test that is abnormal or we need to change your treatment, we will call you to review the results.  Testing/Procedures: Your physician has requested that you have an echocardiogram in 1 year. Echocardiography is a painless test that uses sound waves to create images of your heart. It provides your doctor with information about the size and shape of your heart and how well your heart's chambers and valves are working. This procedure takes approximately one hour. There are no restrictions for this procedure.   Follow-Up: At [facility], you and your health needs are our priority.  As part of our continuing mission to provide you with exceptional heart care, we have created designated Provider Care Teams.  These Care Teams include your primary Cardiologist (physician) and Advanced Practice Providers (APPs -  Physician Assistants and Nurse Practitioners) who all work together to provide you with the care you need, when you need it.  We recommend signing up for the patient portal called "MyChart".  Sign up information is provided on this After Visit Summary.  MyChart is used to connect with patients for Virtual Visits (Telemedicine).  Patients are able to view lab/test results, encounter notes, upcoming appointments, etc.  Non-urgent messages can be sent to your provider as well.   To learn more about what you can do with MyChart, go to [email or web address].    Your next appointment:   12 month(s)  The format for your next appointment:   In Person  Provider:   You may see Peter Nishan, MD or one of the following Advanced Practice Providers on  your designated Care Team:    Lori Gerhardt, NP  Laura Ingold, NP  Jill McDaniel, NP     

## 2020-06-15 NOTE — Telephone Encounter (Signed)
**Note De-Identified Taite Schoeppner Obfuscation** We received a letter from [employer] pt asst [employer] stating that they need the pts signature, date, ins info, and her income info (the entire pt part of the application) as all they have received is Dr Mariana Arn part of the application that his nurse faxed in for the pt 05/31/2020.  I have contacted the pt and she states that she forgot to put her part of the application in the mail box but will do so today.  She thanked me calling to remind her.

## 2020-07-05 ENCOUNTER — Telehealth: Payer: Self-pay | Admitting: Family Medicine

## 2020-07-05 NOTE — Telephone Encounter (Signed)
Left message for patient to call back and schedule Medicare Annual Wellness Visit (AWV) either virtually or in office. Did not leave detailed message  Last AWVI no information   please schedule at anytime with [facility] Nurse Health Advisor 1 or 2   This should be a 45 minute visit. 

## 2020-07-05 NOTE — Telephone Encounter (Signed)
**Note De-Identified Beth Macias Obfuscation** Letter received Marguriete Wootan fax from [facility] stating that they approved the pt for asst with Entresto for the remainder of 2022. Pt ID: [id number]  The letter states that they have notified the pt of this approval as well.

## 2020-07-12 ENCOUNTER — Other Ambulatory Visit: Payer: Self-pay

## 2020-07-12 DIAGNOSIS — E876 Hypokalemia: Secondary | ICD-10-CM

## 2020-07-12 MED ORDER — TORSEMIDE 20 MG PO TABS: 20.0000 mg | ORAL_TABLET | Freq: Every day | ORAL | 3 refills | Status: DC

## 2020-07-12 MED ORDER — POTASSIUM CHLORIDE 20 MEQ/15ML (10%) PO SOLN: 20.0000 meq | Freq: Every day | ORAL | 11 refills | Status: DC

## 2020-07-12 MED ORDER — CARVEDILOL 25 MG PO TABS: 25.0000 mg | ORAL_TABLET | Freq: Two times a day (BID) | ORAL | 3 refills | Status: DC

## 2020-07-12 NOTE — Telephone Encounter (Signed)
Pt's medications were sent to pt's pharmacy [facility] mail order pharmacy as requested. Confirmation received.

## 2020-08-16 ENCOUNTER — Other Ambulatory Visit: Payer: Self-pay | Admitting: Pulmonary Disease

## 2020-08-16 DIAGNOSIS — R053 Chronic cough: Secondary | ICD-10-CM

## 2020-08-16 DIAGNOSIS — J4540 Moderate persistent asthma, uncomplicated: Secondary | ICD-10-CM

## 2020-08-16 DIAGNOSIS — R058 Other specified cough: Secondary | ICD-10-CM

## 2020-08-22 ENCOUNTER — Other Ambulatory Visit: Payer: Self-pay

## 2020-08-22 ENCOUNTER — Ambulatory Visit (HOSPITAL_COMMUNITY): Payer: Medicare Other | Attending: Cardiovascular Disease

## 2020-08-22 DIAGNOSIS — I420 Dilated cardiomyopathy: Secondary | ICD-10-CM | POA: Diagnosis not present

## 2020-08-22 LAB — ECHOCARDIOGRAM COMPLETE
Area-P 1/2: 2.62 cm2
S' Lateral: 2.7 cm

## 2020-08-24 ENCOUNTER — Telehealth: Payer: Self-pay

## 2020-08-24 DIAGNOSIS — I420 Dilated cardiomyopathy: Secondary | ICD-10-CM

## 2020-08-24 NOTE — Telephone Encounter (Signed)
The patient has been notified of the result and verbalized understanding.  All questions (if any) were answered. Michaelyn Barter, RN 08/24/2020 10:10 AM   Will place order for patient to have echo in 6 months.

## 2020-08-24 NOTE — Telephone Encounter (Signed)
-----   Message from Josue Hector, MD sent at 08/22/2020  5:40 PM EDT ----- Pericardial effusion no longer circumferential just posterior no tamponade EF normal f/u echo in 6 months

## 2020-09-01 ENCOUNTER — Other Ambulatory Visit: Payer: Self-pay | Admitting: Pulmonary Disease

## 2020-09-01 DIAGNOSIS — J4540 Moderate persistent asthma, uncomplicated: Secondary | ICD-10-CM

## 2020-09-01 DIAGNOSIS — R053 Chronic cough: Secondary | ICD-10-CM

## 2020-09-01 DIAGNOSIS — R058 Other specified cough: Secondary | ICD-10-CM

## 2020-09-07 IMAGING — CR CHEST - 2 VIEW
2 series · 2 of 2 positions shown · non-contrast
Comparison: Chest radiographs dated 06/06/2018 and 05/03/2018; CTA
chest dated 06/06/2018

CLINICAL DATA: Sob since [DATE], hx of asthma, and chf, greatly
increased sob, mid chest tightness, wheezing, and coughing,
non-smoker, not a call [id number]

EXAM:
CHEST - 2 VIEW

[w chest pa]
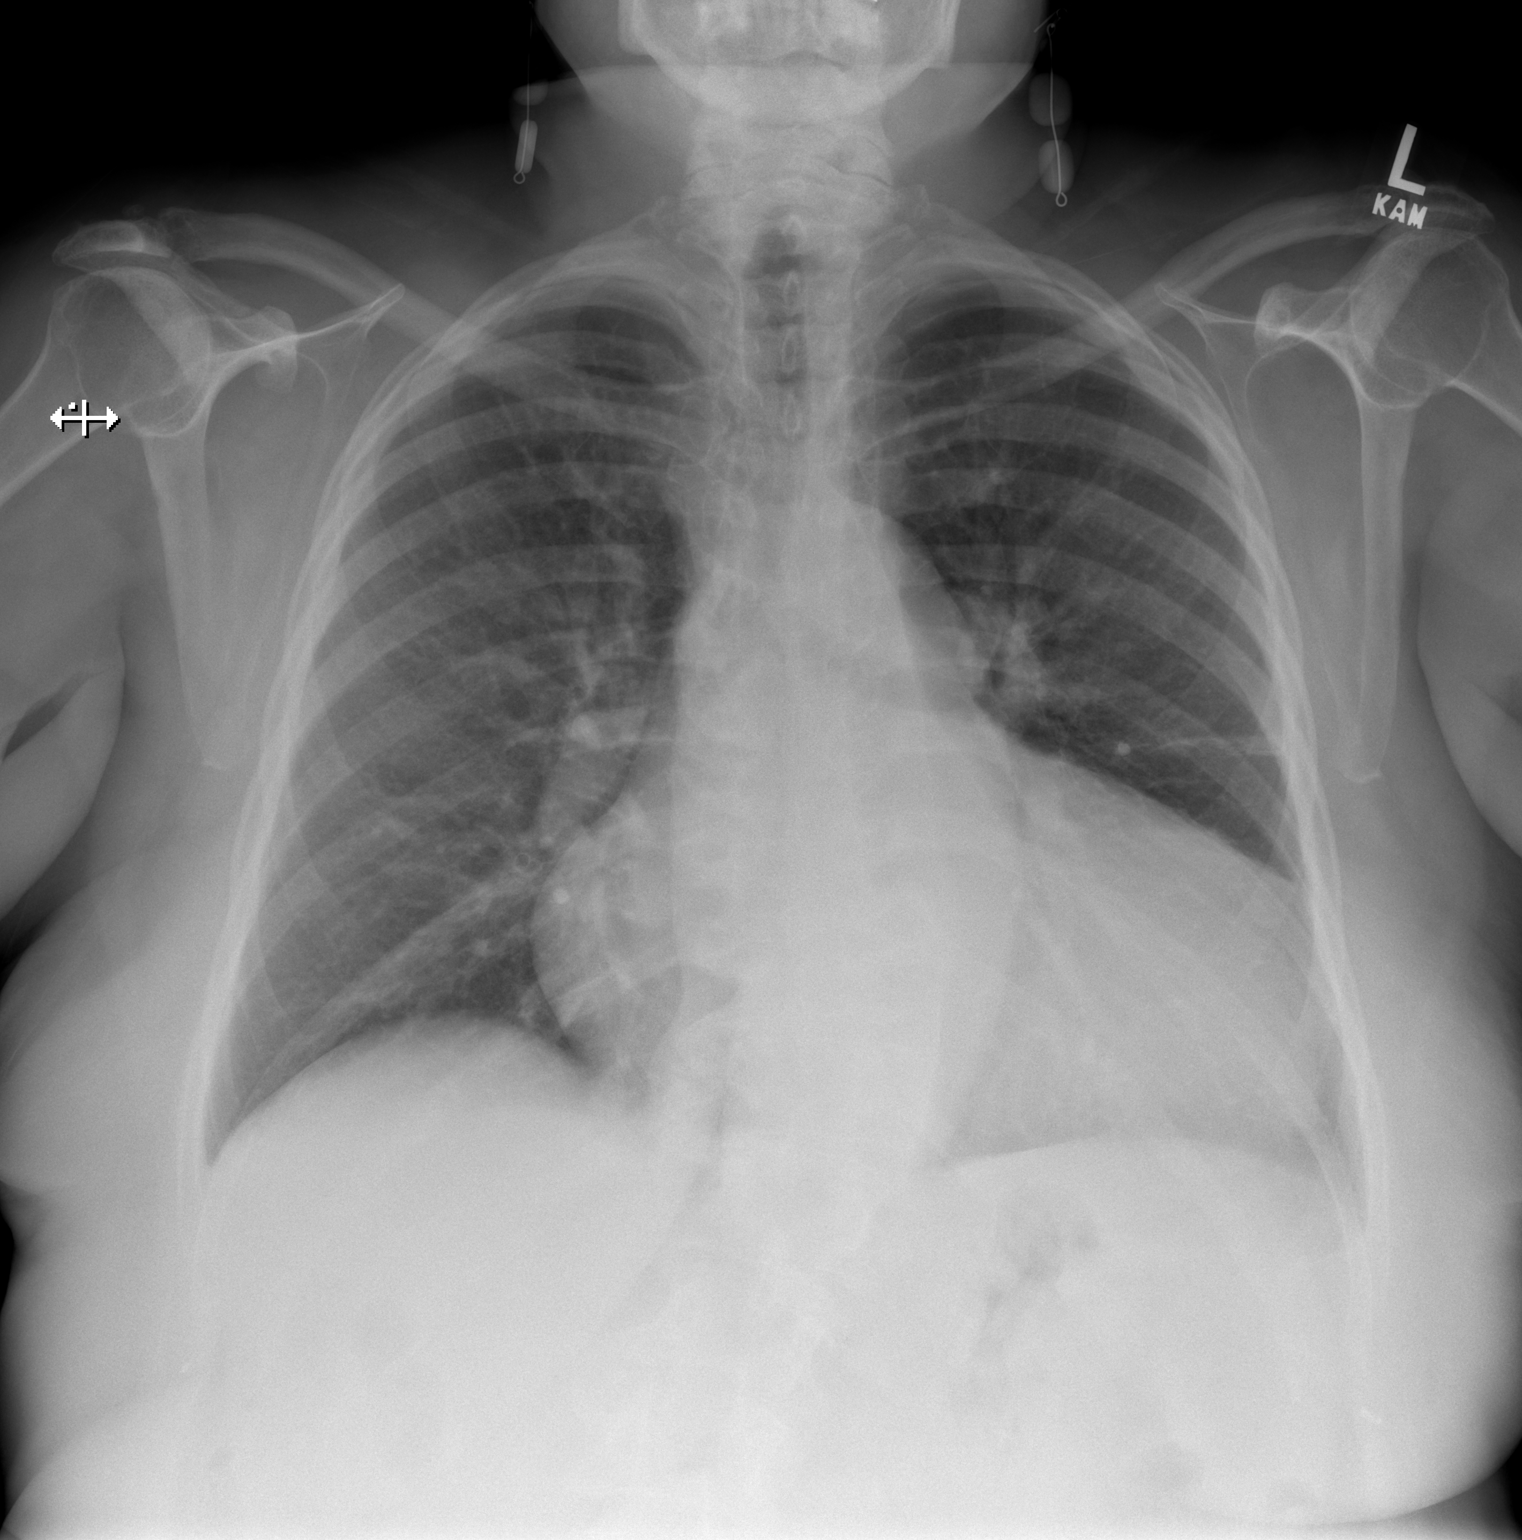

[w chest lat]
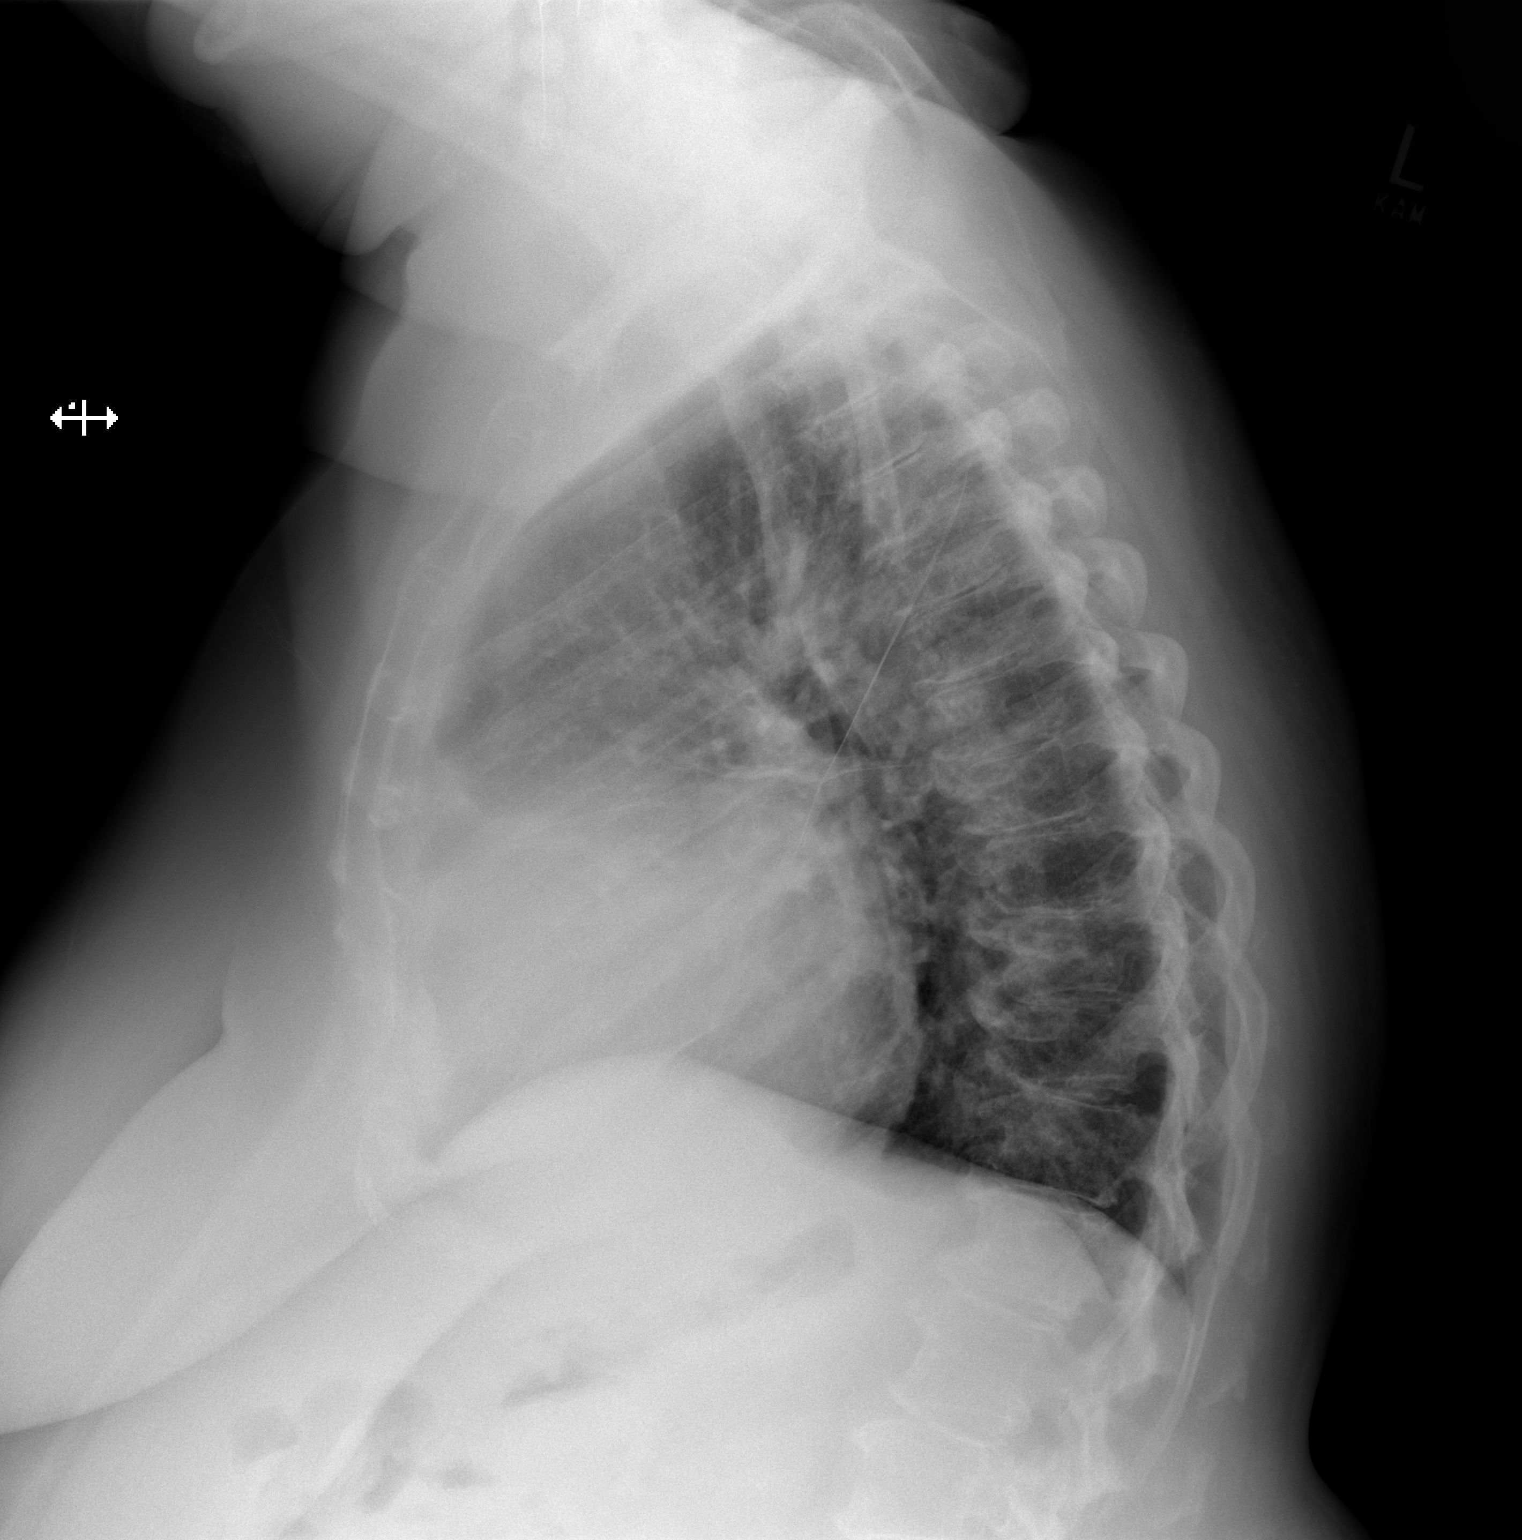

[2 of 2 positions shown; findings below may reference images not displayed]

FINDINGS: Stable cardiomegaly with left heart enlargement. Unchanged
mediastinal contours. Small linear opacities in the left mid lung
similar prior likely reflect scarring. The central venous congestion
without overt edema. No new focal infiltrate. No pneumothorax or
pleural effusion. Multilevel degenerative changes in the thoracic
spine. Upper abdomen is unremarkable.
IMPRESSION: Cardiomegaly and central venous congestion without overt edema.

## 2020-09-17 ENCOUNTER — Ambulatory Visit (INDEPENDENT_AMBULATORY_CARE_PROVIDER_SITE_OTHER): Payer: Medicare Other

## 2020-09-17 DIAGNOSIS — Z0000 Encounter for general adult medical examination without abnormal findings: Secondary | ICD-10-CM

## 2020-09-17 DIAGNOSIS — Z780 Asymptomatic menopausal state: Secondary | ICD-10-CM | POA: Diagnosis not present

## 2020-09-17 DIAGNOSIS — Z1231 Encounter for screening mammogram for malignant neoplasm of breast: Secondary | ICD-10-CM | POA: Diagnosis not present

## 2020-09-17 NOTE — Progress Notes (Signed)
Subjective:   Beth Macias is a 70 y.o. female who presents for an Initial Medicare Annual Wellness Visit.  I connected with Beth Macias  today by telephone and verified that I am speaking with the correct person using two identifiers. Location patient: home Location provider: work Persons participating in the virtual visit: patient, provider.   I discussed the limitations, risks, security and privacy concerns of performing an evaluation and management service by telephone and the availability of in person appointments. I also discussed with the patient that there may be a patient responsible charge related to this service. The patient expressed understanding and verbally consented to this telephonic visit.    Interactive audio and video telecommunications were attempted between this provider and patient, however failed, due to patient having technical difficulties OR patient did not have access to video capability.  We continued and completed visit with audio only.     Review of Systems    n/a Cardiac Risk Factors include: advanced age (>55men, >63 women);diabetes mellitus;hypertension;dyslipidemia     Objective:    Today's Vitals   There is no height or weight on file to calculate BMI.  Advanced Directives 09/17/2020 10/25/2019 10/21/2019 12/05/2018 04/07/2018  Does Patient Have a Medical Advance Directive? Yes Yes Yes No Yes  Type of Paramedic of [place];Living will - - - [place];Living will  Does patient want to make changes to medical advance directive? No - Patient declined Yes (Inpatient - patient defers changing a medical advance directive and declines information at this time) - - -  Copy of [place] in Chart? No - copy requested - - - -  Would patient like information on creating a medical advance directive? - - - No - Patient declined No - Patient declined    Current Medications  (verified) Outpatient Encounter Medications as of 09/17/2020  Medication Sig  . allopurinol (ZYLOPRIM) 100 MG tablet Take 1 tablet (100 mg total) by mouth daily.  . carvedilol (COREG) 25 MG tablet Take 1 tablet (25 mg total) by mouth 2 (two) times daily.  . Cholecalciferol (VITAMIN D3) 125 MCG (5000 UT) CAPS Take 1,000 Units by mouth at bedtime.   . famotidine (PEPCID) 40 MG tablet SMARTSIG:1 Tablet(s) By Mouth Every 12 Hours PRN  . glucose blood (ONETOUCH ULTRA) test strip Use to check blood sugar 4-5 times daily  . ipratropium-albuterol (DUONEB) 0.5-2.5 (3) MG/3ML SOLN Take 3 mLs by nebulization every 6 (six) hours as needed.  Marland Kitchen levocetirizine (XYZAL) 5 MG tablet Take 5 mg by mouth every evening.  . montelukast (SINGULAIR) 10 MG tablet TAKE 1 TABLET BY MOUTH  DAILY  . omeprazole (PRILOSEC) 40 MG capsule Take 1 capsule (40 mg total) by mouth daily.  . potassium chloride 20 MEQ/15ML (10%) SOLN Take 15 mLs (20 mEq total) by mouth daily. Takes only when taking prn torsemide  . PROAIR HFA 108 (90 Base) MCG/ACT inhaler Inhale 2 puffs into the lungs every 4 (four) hours as needed for wheezing.   . sacubitril-valsartan (ENTRESTO) 49-51 MG Take 1 tablet by mouth 2 (two) times daily.  Marland Kitchen torsemide (DEMADEX) 20 MG tablet Take 1 tablet (20 mg total) by mouth daily.  . vitamin C (ASCORBIC ACID) 500 MG tablet Take 500 mg by mouth daily.  . Febuxostat 80 MG TABS Take 1 tablet (80 mg total) by mouth daily. (Patient not taking: Reported on 09/17/2020)  . magnesium 30 MG tablet Take 400 mg by mouth daily. (  Patient not taking: Reported on 09/17/2020)  . methylPREDNISolone (MEDROL DOSEPAK) 4 MG TBPK tablet 6 day dose pack - take as directed (Patient not taking: Reported on 09/17/2020)   No facility-administered encounter medications on file as of 09/17/2020.    Allergies (verified) Strawberry (diagnostic), Penicillins, Tape, and Gabapentin   History: Past Medical History:  Diagnosis Date  . Asthma   . Blood  in stool   . Colon polyps   . Diabetes mellitus without complication ([place])   . Diverticulitis   . Fibromyalgia   . GERD (gastroesophageal reflux disease)   . Gout   . Hypercholesteremia   . Hypertension   . Pneumonia   . Pneumonia   . UTI (urinary tract infection)    Past Surgical History:  Procedure Laterality Date  . APPENDECTOMY    . CESAREAN SECTION     x 3  . LIPOMA EXCISION    . MENISCUS REPAIR Left   . OVARIAN CYST REMOVAL    . OVARIAN CYST SURGERY     Family History  Problem Relation Age of Onset  . Heart failure Mother   . Gout Mother   . Hypertension Mother   . Diabetes Mother   . Heart failure Father   . Hypertension Father   . AAA (abdominal aortic aneurysm) Father   . Gout Brother   . Hypertension Brother   . Heart attack Brother   . Colon cancer Maternal Uncle   . Crohn's disease Paternal Aunt   . Breast cancer Neg Hx    Social History   Socioeconomic History  . Marital status: Legally Separated    Spouse name: Not on file  . Number of children: 3  . Years of education: Not on file  . Highest education level: Not on file  Occupational History  . Occupation: Pharmacist, hospital  Tobacco Use  . Smoking status: Former Smoker    Packs/day: 0.25    Years: 9.00    Pack years: 2.25    Types: Cigarettes    Quit date: 05/26/1976    Years since quitting: 44.3  . Smokeless tobacco: Never Used  Vaping Use  . Vaping Use: Never used  Substance and Sexual Activity  . Alcohol use: Never  . Drug use: Never  . Sexual activity: Yes  Other Topics Concern  . Not on file  Social History Narrative  . Not on file   Social Determinants of Health   Financial Resource Strain: Low Risk   . Difficulty of Paying Living Expenses: Not hard at all  Food Insecurity: No Food Insecurity  . Worried About Charity fundraiser in the Last Year: Never true  . Ran Out of Food in the Last Year: Never true  Transportation Needs: No Transportation Needs  . Lack of Transportation  (Medical): No  . Lack of Transportation (Non-Medical): No  Physical Activity: Insufficiently Active  . Days of Exercise per Week: 3 days  . Minutes of Exercise per Session: 30 min  Stress: No Stress Concern Present  . Feeling of Stress : Not at all  Social Connections: Socially Isolated  . Frequency of Communication with Friends and Family: More than three times a week  . Frequency of Social Gatherings with Friends and Family: More than three times a week  . Attends Religious Services: Never  . Active Member of Clubs or Organizations: No  . Attends Archivist Meetings: Never  . Marital Status: Separated    Tobacco Counseling Counseling given: Not Answered  Clinical Intake:  Pre-visit preparation completed: Yes  Pain : No/denies pain     Diabetes: Yes CBG done?: No Did pt. bring in CBG monitor from home?: No  How often do you need to have someone help you when you read instructions, pamphlets, or other written materials from your doctor or pharmacy?: 1 - Never What is the last grade level you completed in school?: college  Diabetic?yes Nutrition Risk Assessment:  Has the patient had any N/V/D within the last 2 months?  No  Does the patient have any non-healing wounds?  No  Has the patient had any unintentional weight loss or weight gain?  No   Diabetes:  Is the patient diabetic?  Yes  If diabetic, was a CBG obtained today?  No  Did the patient bring in their glucometer from home?  No  How often do you monitor your CBG's? daily.   Financial Strains and Diabetes Management:  Are you having any financial strains with the device, your supplies or your medication? No .  Does the patient want to be seen by Chronic Care Management for management of their diabetes?  No  Would the patient like to be referred to a Nutritionist or for Diabetic Management?  No   Diabetic Exams:  Diabetic Eye Exam: Completed the eye Center  Diabetic Foot Exam: Overdue, Pt has  been advised about the importance in completing this exam. Pt is scheduled for diabetic foot exam on next office visit .   Interpreter Needed?: No  Information entered by :: L.Seanpatrick Maisano, Lpn   Activities of Daily Living In your present state of health, do you have any difficulty performing the following activities: 09/17/2020  Hearing? N  Vision? N  Difficulty concentrating or making decisions? N  Walking or climbing stairs? N  Dressing or bathing? N  Doing errands, shopping? N  Preparing Food and eating ? N  Using the Toilet? N  In the past six months, have you accidently leaked urine? N  Do you have problems with loss of bowel control? N  Managing your Medications? N  Managing your Finances? N  Housekeeping or managing your Housekeeping? N  Some recent data might be hidden    Patient Care Team: Billie Ruddy, MD as PCP - General (Family Medicine) Josue Hector, MD as PCP - Cardiology (Cardiology)  Indicate any recent Medical Services you may have received from other than [facility] providers in the past year (date may be approximate).     Assessment:   This is a routine wellness examination for [place].  Hearing/Vision screen  Hearing Screening   125Hz  250Hz  500Hz  1000Hz  2000Hz  3000Hz  4000Hz  6000Hz  8000Hz   Right ear:           Left ear:           Vision Screening Comments: Annual eye exam cataracts   Dietary issues and exercise activities discussed: Current Exercise Habits: Home exercise routine, Type of exercise: walking, Time (Minutes): 40, Frequency (Times/Week): 5, Weekly Exercise (Minutes/Week): 200, Intensity: Mild, Exercise limited by: None identified  Goals    . DIET - INCREASE WATER INTAKE      Depression Screen PHQ 2/9 Scores 09/17/2020 09/17/2020 06/29/2019  PHQ - 2 Score 0 0 0    Fall Risk Fall Risk  09/17/2020  Falls in the past year? 0  Number falls in past yr: 0  Injury with Fall? 0  Follow up Falls evaluation completed    [place]:  Any stairs in or around the home? Yes  If so, are there any without handrails? Yes  Home free of loose throw rugs in walkways, pet beds, electrical cords, etc? Yes  Adequate lighting in your home to reduce risk of falls? Yes   ASSISTIVE DEVICES UTILIZED TO PREVENT FALLS:  Life alert? No  Use of a cane, walker or w/c? No  Grab bars in the bathroom? No  Shower chair or bench in shower? No  Elevated toilet seat or a handicapped toilet? No    Cognitive Function:   Normal cognitive status assessed by direct observation by this Nurse Health Advisor. No abnormalities found.        Immunizations Immunization History  Administered Date(s) Administered  . Fluad Quad(high Dose 65+) 03/07/2019  . Influenza Whole 02/23/2018  . Influenza, High Dose Seasonal PF 04/23/2015, 03/04/2016  . Influenza, Seasonal, Injecte, Preservative Fre 03/28/2013, 05/12/2014  . PFIZER(Purple Top)SARS-COV-2 Vaccination 07/21/2019, 08/16/2019  . PPD Test 02/18/2016  . Pneumococcal Conjugate-13 04/23/2015  . Pneumococcal Polysaccharide-23 08/11/2013, 06/29/2019  . Tdap 05/12/2014    TDAP status: Up to date  Flu Vaccine status: Up to date  Pneumococcal vaccine status: Up to date  Covid-19 vaccine status: Completed vaccines  Qualifies for Shingles Vaccine? Yes   Zostavax completed No   Shingrix Completed?: No.    Education has been provided regarding the importance of this vaccine. Patient has been advised to call insurance company to determine out of pocket expense if they have not yet received this vaccine. Advised may also receive vaccine at local pharmacy or Health Dept. Verbalized acceptance and understanding.  Screening Tests Health Maintenance  Topic Date Due  . Hepatitis C Screening  Never done  . FOOT EXAM  Never done  . DEXA SCAN  Never done  . COVID-19 Vaccine (3 - Booster for Pfizer series) 02/16/2020  . HEMOGLOBIN A1C  08/29/2020  . OPHTHALMOLOGY EXAM  11/22/2020   . INFLUENZA VACCINE  12/24/2020  . MAMMOGRAM  02/23/2021  . TETANUS/TDAP  05/12/2024  . COLONOSCOPY (Pts 45-75yrs Insurance coverage will need to be confirmed)  04/29/2028  . PNA vac Low Risk Adult  Completed  . HPV VACCINES  Aged Out    Health Maintenance  Health Maintenance Due  Topic Date Due  . Hepatitis C Screening  Never done  . FOOT EXAM  Never done  . DEXA SCAN  Never done  . COVID-19 Vaccine (3 - Booster for Pfizer series) 02/16/2020  . HEMOGLOBIN A1C  08/29/2020    Colorectal cancer screening: Type of screening: Colonoscopy. Completed 05/23/2018. Repeat every 10 years  Mammogram status: Ordered 09/18/2019. Pt provided with contact info and advised to call to schedule appt.   Bone Density status: Ordered 09/17/2020. Pt provided with contact info and advised to call to schedule appt.  Lung Cancer Screening: (Low Dose CT Chest recommended if Age 62-80 years, 30 pack-year currently smoking OR have quit w/in 15years.) does not qualify.   Lung Cancer Screening Referral: n/a   Additional Screening:  Hepatitis C Screening: does qualify;  Vision Screening: Recommended annual ophthalmology exams for early detection of glaucoma and other disorders of the eye. Is the patient up to date with their annual eye exam?  Yes  Who is the provider or what is the name of the office in which the patient attends annual eye exams? [facility]  If pt is not established with a provider, would they like to be referred to a provider to establish care? No .  Dental Screening: Recommended annual dental exams for proper oral hygiene  Community Resource Referral / Chronic Care Management: CRR required this visit?  No   CCM required this visit?  No      Plan:     I have personally reviewed and noted the following in the patient's chart:   . Medical and social history . Use of alcohol, tobacco or illicit drugs  . Current medications and supplements . Functional ability  and status . Nutritional status . Physical activity . Advanced directives . List of other physicians . Hospitalizations, surgeries, and ER visits in previous 12 months . Vitals . Screenings to include cognitive, depression, and falls . Referrals and appointments  In addition, I have reviewed and discussed with patient certain preventive protocols, quality metrics, and best practice recommendations. A written personalized care plan for preventive services as well as general preventive health recommendations were provided to patient.     Randel Pigg, LPN   2/44/0102   Nurse Notes: none

## 2020-09-17 NOTE — Patient Instructions (Signed)
Beth Macias , Thank you for taking time to come for your Medicare Wellness Visit. I appreciate your ongoing commitment to your health goals. Please review the following plan we discussed and let me know if I can assist you in the future.   Screening recommendations/referrals: Colonoscopy: current due 02/24/2028 Mammogram: referral completed Bone Density: referral completed Recommended yearly ophthalmology/optometry visit for glaucoma screening and checkup Recommended yearly dental visit for hygiene and checkup  Vaccinations: Influenza vaccine: current due fall 2022 Pneumococcal vaccine: completed series  Tdap vaccine: current due 2025 Shingles vaccine: completed series  Advanced directives: will bring copies   Conditions/risks identified: none   Next appointment: 09/24/2020 @ 100pm with Dr. Volanda Napoleon    Preventive Care 65 Years and Older, Female Preventive care refers to lifestyle choices and visits with your health care provider that can promote health and wellness. What does preventive care include?  A yearly physical exam. This is also called an annual well check.  Dental exams once or twice a year.  Routine eye exams. Ask your health care provider how often you should have your eyes checked.  Personal lifestyle choices, including:  Daily care of your teeth and gums.  Regular physical activity.  Eating a healthy diet.  Avoiding tobacco and drug use.  Limiting alcohol use.  Practicing safe sex.  Taking low-dose aspirin every day.  Taking vitamin and mineral supplements as recommended by your health care provider. What happens during an annual well check? The services and screenings done by your health care provider during your annual well check will depend on your age, overall health, lifestyle risk factors, and family history of disease. Counseling  Your health care provider may ask you questions about your:  Alcohol use.  Tobacco use.  Drug use.  Emotional  well-being.  Home and relationship well-being.  Sexual activity.  Eating habits.  History of falls.  Memory and ability to understand (cognition).  Work and work Statistician.  Reproductive health. Screening  You may have the following tests or measurements:  Height, weight, and BMI.  Blood pressure.  Lipid and cholesterol levels. These may be checked every 5 years, or more frequently if you are over 25 years old.  Skin check.  Lung cancer screening. You may have this screening every year starting at age 11 if you have a 30-pack-year history of smoking and currently smoke or have quit within the past 15 years.  Fecal occult blood test (FOBT) of the stool. You may have this test every year starting at age 15.  Flexible sigmoidoscopy or colonoscopy. You may have a sigmoidoscopy every 5 years or a colonoscopy every 10 years starting at age 89.  Hepatitis C blood test.  Hepatitis B blood test.  Sexually transmitted disease (STD) testing.  Diabetes screening. This is done by checking your blood sugar (glucose) after you have not eaten for a while (fasting). You may have this done every 1-3 years.  Bone density scan. This is done to screen for osteoporosis. You may have this done starting at age 66.  Mammogram. This may be done every 1-2 years. Talk to your health care provider about how often you should have regular mammograms. Talk with your health care provider about your test results, treatment options, and if necessary, the need for more tests. Vaccines  Your health care provider may recommend certain vaccines, such as:  Influenza vaccine. This is recommended every year.  Tetanus, diphtheria, and acellular pertussis (Tdap, Td) vaccine. You may need a Td booster  every 10 years.  Zoster vaccine. You may need this after age 80.  Pneumococcal 13-valent conjugate (PCV13) vaccine. One dose is recommended after age 88.  Pneumococcal polysaccharide (PPSV23) vaccine. One  dose is recommended after age 62. Talk to your health care provider about which screenings and vaccines you need and how often you need them. This information is not intended to replace advice given to you by your health care provider. Make sure you discuss any questions you have with your health care provider. Document Released: 06/08/2015 Document Revised: 01/30/2016 Document Reviewed: 03/13/2015 Elsevier Interactive Patient Education  2017 [place] Prevention in the Home Falls can cause injuries. They can happen to people of all ages. There are many things you can do to make your home safe and to help prevent falls. What can I do on the outside of my home?  Regularly fix the edges of walkways and driveways and fix any cracks.  Remove anything that might make you trip as you walk through a door, such as a raised step or threshold.  Trim any bushes or trees on the path to your home.  Use bright outdoor lighting.  Clear any walking paths of anything that might make someone trip, such as rocks or tools.  Regularly check to see if handrails are loose or broken. Make sure that both sides of any steps have handrails.  Any raised decks and porches should have guardrails on the edges.  Have any leaves, snow, or ice cleared regularly.  Use sand or salt on walking paths during winter.  Clean up any spills in your garage right away. This includes oil or grease spills. What can I do in the bathroom?  Use night lights.  Install grab bars by the toilet and in the tub and shower. Do not use towel bars as grab bars.  Use non-skid mats or decals in the tub or shower.  If you need to sit down in the shower, use a plastic, non-slip stool.  Keep the floor dry. Clean up any water that spills on the floor as soon as it happens.  Remove soap buildup in the tub or shower regularly.  Attach bath mats securely with double-sided non-slip rug tape.  Do not have throw rugs and other  things on the floor that can make you trip. What can I do in the bedroom?  Use night lights.  Make sure that you have a light by your bed that is easy to reach.  Do not use any sheets or blankets that are too big for your bed. They should not hang down onto the floor.  Have a firm chair that has side arms. You can use this for support while you get dressed.  Do not have throw rugs and other things on the floor that can make you trip. What can I do in the kitchen?  Clean up any spills right away.  Avoid walking on wet floors.  Keep items that you use a lot in easy-to-reach places.  If you need to reach something above you, use a strong step stool that has a grab bar.  Keep electrical cords out of the way.  Do not use floor polish or wax that makes floors slippery. If you must use wax, use non-skid floor wax.  Do not have throw rugs and other things on the floor that can make you trip. What can I do with my stairs?  Do not leave any items on the stairs.  Make  sure that there are handrails on both sides of the stairs and use them. Fix handrails that are broken or loose. Make sure that handrails are as long as the stairways.  Check any carpeting to make sure that it is firmly attached to the stairs. Fix any carpet that is loose or worn.  Avoid having throw rugs at the top or bottom of the stairs. If you do have throw rugs, attach them to the floor with carpet tape.  Make sure that you have a light switch at the top of the stairs and the bottom of the stairs. If you do not have them, ask someone to add them for you. What else can I do to help prevent falls?  Wear shoes that:  Do not have high heels.  Have rubber bottoms.  Are comfortable and fit you well.  Are closed at the toe. Do not wear sandals.  If you use a stepladder:  Make sure that it is fully opened. Do not climb a closed stepladder.  Make sure that both sides of the stepladder are locked into place.  Ask  someone to hold it for you, if possible.  Clearly mark and make sure that you can see:  Any grab bars or handrails.  First and last steps.  Where the edge of each step is.  Use tools that help you move around (mobility aids) if they are needed. These include:  Canes.  Walkers.  Scooters.  Crutches.  Turn on the lights when you go into a dark area. Replace any light bulbs as soon as they burn out.  Set up your furniture so you have a clear path. Avoid moving your furniture around.  If any of your floors are uneven, fix them.  If there are any pets around you, be aware of where they are.  Review your medicines with your doctor. Some medicines can make you feel dizzy. This can increase your chance of falling. Ask your doctor what other things that you can do to help prevent falls. This information is not intended to replace advice given to you by your health care provider. Make sure you discuss any questions you have with your health care provider. Document Released: 03/08/2009 Document Revised: 10/18/2015 Document Reviewed: 06/16/2014 Elsevier Interactive Patient Education  2017 [employer].

## 2020-09-24 ENCOUNTER — Encounter: Payer: Self-pay | Admitting: Family Medicine

## 2020-09-24 ENCOUNTER — Other Ambulatory Visit: Payer: Self-pay

## 2020-09-24 ENCOUNTER — Ambulatory Visit (INDEPENDENT_AMBULATORY_CARE_PROVIDER_SITE_OTHER): Payer: Medicare Other | Admitting: Family Medicine

## 2020-09-24 VITALS — BP 140/90 | HR 71 | Temp 98.9°F | Wt 233.6 lb

## 2020-09-24 DIAGNOSIS — K649 Unspecified hemorrhoids: Secondary | ICD-10-CM

## 2020-09-24 DIAGNOSIS — K625 Hemorrhage of anus and rectum: Secondary | ICD-10-CM

## 2020-09-24 DIAGNOSIS — R3915 Urgency of urination: Secondary | ICD-10-CM | POA: Diagnosis not present

## 2020-09-24 DIAGNOSIS — G8929 Other chronic pain: Secondary | ICD-10-CM | POA: Diagnosis not present

## 2020-09-24 DIAGNOSIS — I5022 Chronic systolic (congestive) heart failure: Secondary | ICD-10-CM

## 2020-09-24 DIAGNOSIS — I10 Essential (primary) hypertension: Secondary | ICD-10-CM

## 2020-09-24 DIAGNOSIS — M549 Dorsalgia, unspecified: Secondary | ICD-10-CM | POA: Diagnosis not present

## 2020-09-24 DIAGNOSIS — K219 Gastro-esophageal reflux disease without esophagitis: Secondary | ICD-10-CM

## 2020-09-24 LAB — POCT URINALYSIS DIPSTICK
Bilirubin, UA: NEGATIVE
Blood, UA: NEGATIVE
Glucose, UA: NEGATIVE
Ketones, UA: NEGATIVE
Nitrite, UA: NEGATIVE
Protein, UA: POSITIVE — AB
Spec Grav, UA: 1.015 (ref 1.010–1.025)
Urobilinogen, UA: NEGATIVE E.U./dL — AB
pH, UA: 6 (ref 5.0–8.0)

## 2020-09-24 LAB — CBC WITH DIFFERENTIAL/PLATELET
Basophils Absolute: 0.1 10*3/uL (ref 0.0–0.1)
Basophils Relative: 0.9 % (ref 0.0–3.0)
Eosinophils Absolute: 0.3 10*3/uL (ref 0.0–0.7)
Eosinophils Relative: 4 % (ref 0.0–5.0)
HCT: 39.2 % (ref 36.0–46.0)
Hemoglobin: 13.2 g/dL (ref 12.0–15.0)
Lymphocytes Relative: 26.6 % (ref 12.0–46.0)
Lymphs Abs: 1.7 10*3/uL (ref 0.7–4.0)
MCHC: 33.7 g/dL (ref 30.0–36.0)
MCV: 84.4 fl (ref 78.0–100.0)
Monocytes Absolute: 0.6 10*3/uL (ref 0.1–1.0)
Monocytes Relative: 8.8 % (ref 3.0–12.0)
Neutro Abs: 3.8 10*3/uL (ref 1.4–7.7)
Neutrophils Relative %: 59.7 % (ref 43.0–77.0)
Platelets: 245 10*3/uL (ref 150.0–400.0)
RBC: 4.65 Mil/uL (ref 3.87–5.11)
RDW: 14.1 % (ref 11.5–15.5)
WBC: 6.4 10*3/uL (ref 4.0–10.5)

## 2020-09-24 LAB — BASIC METABOLIC PANEL
BUN: 19 mg/dL (ref 6–23)
CO2: 30 mEq/L (ref 19–32)
Calcium: 10.1 mg/dL (ref 8.4–10.5)
Chloride: 104 mEq/L (ref 96–112)
Creatinine, Ser: 0.72 mg/dL (ref 0.40–1.20)
GFR: 84.45 mL/min (ref >60.00)
Glucose, Bld: 115 mg/dL — ABNORMAL HIGH (ref 70–99)
Potassium: 3.8 mEq/L (ref 3.5–5.1)
Sodium: 141 mEq/L (ref 135–145)

## 2020-09-24 LAB — HEMOGLOBIN A1C: Hgb A1c MFr Bld: 7 % — ABNORMAL HIGH (ref 4.6–6.5)

## 2020-09-24 LAB — BRAIN NATRIURETIC PEPTIDE: Pro B Natriuretic peptide (BNP): 276 pg/mL — ABNORMAL HIGH (ref 0.0–100.0)

## 2020-09-24 LAB — MAGNESIUM: Magnesium: 1.8 mg/dL (ref 1.5–2.5)

## 2020-09-24 LAB — VITAMIN B12: Vitamin B-12: 864 pg/mL (ref 211–911)

## 2020-09-24 NOTE — Patient Instructions (Signed)
Hemorrhoids Hemorrhoids are swollen veins that may develop:  In the butt (rectum). These are called internal hemorrhoids.  Around the opening of the butt (anus). These are called external hemorrhoids. Hemorrhoids can cause pain, itching, or bleeding. Most of the time, they do not cause serious problems. They usually get better with diet changes, lifestyle changes, and other home treatments. What are the causes? This condition may be caused by:  Having trouble pooping (constipation).  Pushing hard (straining) to poop.  Watery poop (diarrhea).  Pregnancy.  Being very overweight (obese).  Sitting for long periods of time.  Heavy lifting or other activity that causes you to strain.  Anal sex.  Riding a bike for a long period of time. What are the signs or symptoms? Symptoms of this condition include:  Pain.  Itching or soreness in the butt.  Bleeding from the butt.  Leaking poop.  Swelling in the area.  One or more lumps around the opening of your butt. How is this diagnosed? A doctor can often diagnose this condition by looking at the affected area. The doctor may also:  Do an exam that involves feeling the area with a gloved hand (digital rectal exam).  Examine the area inside your butt using a small tube (anoscope).  Order blood tests. This may be done if you have lost a lot of blood.  Have you get a test that involves looking inside the colon using a flexible tube with a camera on the end (sigmoidoscopy or colonoscopy). How is this treated? This condition can usually be treated at home. Your doctor may tell you to change what you eat, make lifestyle changes, or try home treatments. If these do not help, procedures can be done to remove the hemorrhoids or make them smaller. These may involve:  Placing rubber bands at the base of the hemorrhoids to cut off their blood supply.  Injecting medicine into the hemorrhoids to shrink them.  Shining a type of light  energy onto the hemorrhoids to cause them to fall off.  Doing surgery to remove the hemorrhoids or cut off their blood supply. Follow these instructions at home: Eating and drinking  Eat foods that have a lot of fiber in them. These include whole grains, beans, nuts, fruits, and vegetables.  Ask your doctor about taking products that have added fiber (fibersupplements).  Reduce the amount of fat in your diet. You can do this by: ? Eating low-fat dairy products. ? Eating less red meat. ? Avoiding processed foods.  Drink enough fluid to keep your pee (urine) pale yellow.   Managing pain and swelling  Take a warm-water bath (sitz bath) for 20 minutes to ease pain. Do this 3-4 times a day. You may do this in a bathtub or using a portable sitz bath that fits over the toilet.  If told, put ice on the painful area. It may be helpful to use ice between your warm baths. ? Put ice in a plastic bag. ? Place a towel between your skin and the bag. ? Leave the ice on for 20 minutes, 2-3 times a day.   General instructions  Take over-the-counter and prescription medicines only as told by your doctor. ? Medicated creams and medicines may be used as told.  Exercise often. Ask your doctor how much and what kind of exercise is best for you.  Go to the bathroom when you have the urge to poop. Do not wait.  Avoid pushing too hard when you poop.  Keep your butt dry and clean. Use wet toilet paper or moist towelettes after pooping.  Do not sit on the toilet for a long time.  Keep all follow-up visits as told by your doctor. This is important. Contact a doctor if you:  Have pain and swelling that do not get better with treatment or medicine.  Have trouble pooping.  Cannot poop.  Have pain or swelling outside the area of the hemorrhoids. Get help right away if you have:  Bleeding that will not stop. Summary  Hemorrhoids are swollen veins in the butt or around the opening of the  butt.  They can cause pain, itching, or bleeding.  Eat foods that have a lot of fiber in them. These include whole grains, beans, nuts, fruits, and vegetables.  Take a warm-water bath (sitz bath) for 20 minutes to ease pain. Do this 3-4 times a day. This information is not intended to replace advice given to you by your health care provider. Make sure you discuss any questions you have with your health care provider. Document Revised: 05/20/2018 Document Reviewed: 10/01/2017 Elsevier Patient Education  2021 [place] Your Hypertension Hypertension, also called high blood pressure, is when the force of the blood pressing against the walls of the arteries is too strong. Arteries are blood vessels that carry blood from your heart throughout your body. Hypertension forces the heart to work harder to pump blood and may cause the arteries to become narrow or stiff. Understanding blood pressure readings Your personal target blood pressure may vary depending on your medical conditions, your age, and other factors. A blood pressure reading includes a higher number over a lower number. Ideally, your blood pressure should be below 120/80. You should know that:  The first, or top, number is called the systolic pressure. It is a measure of the pressure in your arteries as your heart beats.  The second, or bottom number, is called the diastolic pressure. It is a measure of the pressure in your arteries as the heart relaxes. Blood pressure is classified into four stages. Based on your blood pressure reading, your health care provider may use the following stages to determine what type of treatment you need, if any. Systolic pressure and diastolic pressure are measured in a unit called mmHg. Normal  Systolic pressure: below 102.  Diastolic pressure: below 80. Elevated  Systolic pressure: 585-277.  Diastolic pressure: below 80. Hypertension stage 1  Systolic pressure:  824-235.  Diastolic pressure: 36-14. Hypertension stage 2  Systolic pressure: 431 or above.  Diastolic pressure: 90 or above. How can this condition affect me? Managing your hypertension is an important responsibility. Over time, hypertension can damage the arteries and decrease blood flow to important parts of the body, including the brain, heart, and kidneys. Having untreated or uncontrolled hypertension can lead to:  A heart attack.  A stroke.  A weakened blood vessel (aneurysm).  Heart failure.  Kidney damage.  Eye damage.  Metabolic syndrome.  Memory and concentration problems.  Vascular dementia. What actions can I take to manage this condition? Hypertension can be managed by making lifestyle changes and possibly by taking medicines. Your health care provider will help you make a plan to bring your blood pressure within a normal range. Nutrition  Eat a diet that is high in fiber and potassium, and low in salt (sodium), added sugar, and fat. An example eating plan is called the Dietary Approaches to Stop Hypertension (DASH) diet. To eat this way: ?  Eat plenty of fresh fruits and vegetables. Try to fill one-half of your plate at each meal with fruits and vegetables. ? Eat whole grains, such as whole-wheat pasta, brown rice, or whole-grain bread. Fill about one-fourth of your plate with whole grains. ? Eat low-fat dairy products. ? Avoid fatty cuts of meat, processed or cured meats, and poultry with skin. Fill about one-fourth of your plate with lean proteins such as fish, chicken without skin, beans, eggs, and tofu. ? Avoid pre-made and processed foods. These tend to be higher in sodium, added sugar, and fat.  Reduce your daily sodium intake. Most people with hypertension should eat less than 1,500 mg of sodium a day.   Lifestyle  Work with your health care provider to maintain a healthy body weight or to lose weight. Ask what an ideal weight is for you.  Get at least  30 minutes of exercise that causes your heart to beat faster (aerobic exercise) most days of the week. Activities may include walking, swimming, or biking.  Include exercise to strengthen your muscles (resistance exercise), such as weight lifting, as part of your weekly exercise routine. Try to do these types of exercises for 30 minutes at least 3 days a week.  Do not use any products that contain nicotine or tobacco, such as cigarettes, e-cigarettes, and chewing tobacco. If you need help quitting, ask your health care provider.  Control any long-term (chronic) conditions you have, such as high cholesterol or diabetes.  Identify your sources of stress and find ways to manage stress. This may include meditation, deep breathing, or making time for fun activities.   Alcohol use  Do not drink alcohol if: ? Your health care provider tells you not to drink. ? You are pregnant, may be pregnant, or are planning to become pregnant.  If you drink alcohol: ? Limit how much you use to:  0-1 drink a day for women.  0-2 drinks a day for men. ? Be aware of how much alcohol is in your drink. In the [place] one drink equals one 12 oz bottle of beer (355 mL), one 5 oz glass of wine (148 mL), or one 1 oz glass of hard liquor (44 mL). Medicines Your health care provider may prescribe medicine if lifestyle changes are not enough to get your blood pressure under control and if:  Your systolic blood pressure is 130 or higher.  Your diastolic blood pressure is 80 or higher. Take medicines only as told by your health care provider. Follow the directions carefully. Blood pressure medicines must be taken as told by your health care provider. The medicine does not work as well when you skip doses. Skipping doses also puts you at risk for problems. Monitoring Before you monitor your blood pressure:  Do not smoke, drink caffeinated beverages, or exercise within 30 minutes before taking a measurement.  Use the  bathroom and empty your bladder (urinate).  Sit quietly for at least 5 minutes before taking measurements. Monitor your blood pressure at home as told by your health care provider. To do this:  Sit with your back straight and supported.  Place your feet flat on the floor. Do not cross your legs.  Support your arm on a flat surface, such as a table. Make sure your upper arm is at heart level.  Each time you measure, take two or three readings one minute apart and record the results. You may also need to have your blood pressure checked regularly by your  health care provider.   General information  Talk with your health care provider about your diet, exercise habits, and other lifestyle factors that may be contributing to hypertension.  Review all the medicines you take with your health care provider because there may be side effects or interactions.  Keep all visits as told by your health care provider. Your health care provider can help you create and adjust your plan for managing your high blood pressure. Where to find more information  National Heart, Lung, and Blood Institute: https://wilson-eaton.com/  American Heart Association: www.heart.org Contact a health care provider if:  You think you are having a reaction to medicines you have taken.  You have repeated (recurrent) headaches.  You feel dizzy.  You have swelling in your ankles.  You have trouble with your vision. Get help right away if:  You develop a severe headache or confusion.  You have unusual weakness or numbness, or you feel faint.  You have severe pain in your chest or abdomen.  You vomit repeatedly.  You have trouble breathing. These symptoms may represent a serious problem that is an emergency. Do not wait to see if the symptoms will go away. Get medical help right away. Call your local emergency services (911 in the [place] Do not drive yourself to the hospital. Summary  Hypertension is when the force of  blood pumping through your arteries is too strong. If this condition is not controlled, it may put you at risk for serious complications.  Your personal target blood pressure may vary depending on your medical conditions, your age, and other factors. For most people, a normal blood pressure is less than 120/80.  Hypertension is managed by lifestyle changes, medicines, or both.  Lifestyle changes to help manage hypertension include losing weight, eating a healthy, low-sodium diet, exercising more, stopping smoking, and limiting alcohol. This information is not intended to replace advice given to you by your health care provider. Make sure you discuss any questions you have with your health care provider. Document Revised: 06/17/2019 Document Reviewed: 04/12/2019 Elsevier Patient Education  2021 [place] With Heart Failure Heart failure is a long-term (chronic) condition in which the heart cannot pump enough blood through the body. When this happens, parts of the body do not get the blood and oxygen they need. There is no cure for heart failure at this time, so it is important for you to take good care of yourself and follow the treatment plan you set with your health care provider. If you are living with heart failure, there are ways to help you manage the disease. How to manage lifestyle changes Living with heart failure requires you to make changes in your life. Your health care team will teach you about the changes you need to make in order to relieve your symptoms and lower your risk of going to the hospital. Work with your health care provider to develop a treatment plan that works for you. Activity  Ask your health care provider about attending cardiac rehabilitation. These programs include aerobic physical activity, which provides many benefits for your heart.  If no cardiac rehabilitation program is available, ask your health care provider what aerobic exercises are safe for  you to do.  Return to your normal activities as told by your health care provider. Ask your health care provider what activities are safe for you.  Pace your daily activities and allow time for rest as needed. Managing stress It is normal to have  many emotions about your diagnosis, such as fear, sadness, anger, and loss. If you feel any of these emotions and need help coping, contact your health care provider. Here are some ways to help yourself manage these emotions:  Talk to friends and family members about your condition. They can give you support and guidance. Explain your symptoms to them and, if comfortable, invite them to attend appointments or rehabilitation with you.  Join a support group for people with chronic heart failure. Talking with other people who have the same symptoms may give you new ways of coping with your disease and your emotions.  Accept help from others. Do not be ashamed if you need help with certain tasks.  Use stress management techniques, such as meditation, breathing exercises, or listening to relaxing music. Conditions such as depression and anxiety are common in persons with heart failure. Pay attention to changes in your mood, emotions, and stress levels. Tell your health care provider if you have any of the following symptoms:  Trouble sleeping or a change in your sleeping patterns.  Feeling sad, down, or depressed more often than not, every day for more than 2 weeks.  Losing interest in activities you normally enjoy.  Feeling irritable or crying for no reason.  Finding yourself worrying about the future often. Work You may need to develop a plan with your health care provider if heart failure interferes with your ability to work. This may include:  Reducing your work hours.  Finding functions that are less active or require less effort.  Planning rest periods during your work hours. Travel  Talk with your health care provider if you plan to  travel. There may be circumstances in which your health care provider recommends that you do not travel or that you delay travel until your condition is under control.  When you travel, bring your medicine and a list of your medicines. If you are traveling by air, keep your medicines with you in a carry-on bag.  Consider finding a medical facility in the area you will be traveling to and determine what your health insurance will cover.  If you will be traveling by public transportation (airplane, train, bus), contact the company prior to traveling if you have special needs. This may include needs related to diet, oxygen, a wheelchair, a seating request, or help with luggage.  If you use oxygen, make sure to bring enough oxygen with you.  If you have a battery-powered device, bring a fully charged extra battery with you.  If you have a device, bring a note from your health care provider and inform all security screening personnel that you have the device. You may need to go through special screening for safety. Sexual activity  Ask your health care provider when it is safe for you to resume sexual activity.  You may need to start slowly and gradually increase intimacy. You can increase intimacy by doing such things as caressing, touching, and holding each other.  Get regular exercise as told by your health care provider. This can benefit your sex life by building strength and endurance.   Sleep If your condition interferes with your sleep, find ways to improve your sleep quality, such as:  Sleep lying on your side, or sleep with your head elevated by raising the head of your bed or using multiple pillows.  Ask your health care provider about screening for sleep apnea.  Try to go to sleep and wake up at the same times every  day.  Sleep in a dark, cool room.  Do not do any physical activity or eating for a few hours before bedtime.  Plan rest periods during the day, but do not take long  naps during the day.   Where to find support  Consider talking with: ? Family members. ? Close friends. ? A mental health professional or therapist. ? A member of your church, faith, or community group.  Other sources of support include: ? Local support groups. Ask your health care provider about groups near you. ? Online support groups, such as those found through the American Heart Association: supportnetwork.heart.org ? Local home care agencies, community agencies, or social agencies. ? A palliative care specialist. Palliative care can help you manage symptoms, promote comfort, improve quality of life, and maintain dignity. Where to find more information  American Heart Association: heart.org  National Heart, Lung, and Blood Institute: https://www.hartman-hill.biz/  Centers for Disease Control and Prevention: https://www.reeves.com/  [place]: SolutionApps.it Contact a health care provider if:  You have a rapid weight gain.  You have increasing shortness of breath that is unusual for you.  You are unable to participate in your usual physical activities.  You tire easily.  You have difficulty sleeping, such as: ? You wake up feeling short of breath. ? You have to use more pillows to raise your head in order to sleep.  You cough more than normal, especially with physical activity.  You have any swelling or more swelling in areas such as your hands, feet, ankles, or abdomen.  You become dizzy or light-headed when you stand up.  You have changes to your appetite.  You have symptoms of depression or anxiety. Get help right away if:  You have difficulty breathing.  You notice or your family notices a change in your awareness, such as having trouble staying awake or having difficulty with concentration.  You have pain or discomfort in your chest.  You have an episode of fainting (syncope).  You feel like your heart is beating quickly (palpitations).  You  have extreme feelings of sadness or loss of hope, or you have thoughts about hurting yourself or others. These symptoms may represent a serious problem that is an emergency. Do not wait to see if the symptoms will go away. Get medical help right away. Call your local emergency services (911 in the [place] Do not drive yourself to the hospital. Summary  There is no cure for heart failure, so it is important for you to take good care of yourself and follow the treatment plan set by your health care provider.  Ask your health care provider about attending cardiac rehabilitation. These programs include aerobic physical activity, which provides many benefits for your heart.  It is normal to have many emotions about your diagnosis, such as fear, sadness, anger, and loss. If you feel any of these emotions and need help coping, contact your health care provider.  You may need to develop a plan with your health care provider if heart failure interferes with your ability to work. This information is not intended to replace advice given to you by your health care provider. Make sure you discuss any questions you have with your health care provider. Document Revised: 12/26/2019 Document Reviewed: 12/26/2019 Elsevier Patient Education  2021 [place].  https://doi.[email or web address]">  Chronic Back Pain When back pain lasts longer than 3 months, it is called chronic back pain. The cause of your back pain may not be known.  Some common causes include:  Wear and tear (degenerative disease) of the bones, ligaments, or disks in your back.  Inflammation and stiffness in your back (arthritis). People who have chronic back pain often go through certain periods in which the pain is more intense (flare-ups). Many people can learn to manage the pain with home care. Follow these instructions at home: Pay attention to any changes in your symptoms. Take these actions to help with your pain: Managing pain  and stiffness  If directed, apply ice to the painful area. Your health care provider may recommend applying ice during the first 24-48 hours after a flare-up begins. To do this: ? Put ice in a plastic bag. ? Place a towel between your skin and the bag. ? Leave the ice on for 20 minutes, 2-3 times per day.  If directed, apply heat to the affected area as often as told by your health care provider. Use the heat source that your health care provider recommends, such as a moist heat pack or a heating pad. ? Place a towel between your skin and the heat source. ? Leave the heat on for 20-30 minutes. ? Remove the heat if your skin turns bright red. This is especially important if you are unable to feel pain, heat, or cold. You may have a greater risk of getting burned.  Try soaking in a warm tub.      Activity  Avoid bending and other activities that make the problem worse.  Maintain a proper position when standing or sitting: ? When standing, keep your upper back and neck straight, with your shoulders pulled back. Avoid slouching. ? When sitting, keep your back straight and relax your shoulders. Do not round your shoulders or pull them backward.  Do not sit or stand in one place for long periods of time.  Take brief periods of rest throughout the day. This will reduce your pain. Resting in a lying or standing position is usually better than sitting to rest.  When you are resting for longer periods, mix in some mild activity or stretching between periods of rest. This will help to prevent stiffness and pain.  Get regular exercise. Ask your health care provider what activities are safe for you.  Do not lift anything that is heavier than 10 lb (4.5 kg), or the limit that you are told, until your health care provider says that it is safe. Always use proper lifting technique, which includes: ? Bending your knees. ? Keeping the load close to your body. ? Avoiding twisting.  Sleep on a firm  mattress in a comfortable position. Try lying on your side with your knees slightly bent. If you lie on your back, put a pillow under your knees.   Medicines  Treatment may include medicines for pain and inflammation taken by mouth or applied to the skin, prescription pain medicine, or muscle relaxants. Take over-the-counter and prescription medicines only as told by your health care provider.  Ask your health care provider if the medicine prescribed to you: ? Requires you to avoid driving or using machinery. ? Can cause constipation. You may need to take these actions to prevent or treat constipation:  Drink enough fluid to keep your urine pale yellow.  Take over-the-counter or prescription medicines.  Eat foods that are high in fiber, such as beans, whole grains, and fresh fruits and vegetables.  Limit foods that are high in fat and processed sugars, such as fried or sweet foods. General instructions  Do not use any products that contain nicotine or tobacco, such as cigarettes, e-cigarettes, and chewing tobacco. If you need help quitting, ask your health care provider.  Keep all follow-up visits as told by your health care provider. This is important. Contact a health care provider if:  You have pain that is not relieved with rest or medicine.  Your pain gets worse, or you have new pain.  You have a high fever.  You have rapid weight loss.  You have trouble doing your normal activities. Get help right away if:  You have weakness or numbness in one or both of your legs or feet.  You have trouble controlling your bladder or your bowels.  You have severe back pain and have any of the following: ? Nausea or vomiting. ? Pain in your abdomen. ? Shortness of breath or you faint. Summary  Chronic back pain is back pain that lasts longer than 3 months.  When a flare-up begins, apply ice to the painful area for the first 24-48 hours.  Apply a moist heat pad or use a heating  pad on the painful area as directed by your health care provider.  When you are resting for longer periods, mix in some mild activity or stretching between periods of rest. This will help to prevent stiffness and pain. This information is not intended to replace advice given to you by your health care provider. Make sure you discuss any questions you have with your health care provider. Document Revised: 06/22/2019 Document Reviewed: 06/22/2019 Elsevier Patient Education  2021 [employer].

## 2020-09-24 NOTE — Progress Notes (Signed)
Subjective:    Patient ID: Beth Macias, female    DOB: April 15, 1951, 70 y.o.   MRN: [id number]  Chief Complaint  Patient presents with  . Back Pain    HPI Patient was seen today for chronic concerns.  Pt last seen over 1 yr ago.  Pt with a list of ongoing concerns.  Pt states she had to prioritize dealing with one medical issue at a time and scared, in regards to why she waited to be seen.  Unsure if has UTI.  Endorses urgency, stabbing back pain.  Tried at home test but unsure if did correctly.  Patient also notes h/o hemorrhoids banded x3 with recurrent bleeding and foul odor with stools.  Notes bright red blood on tissue and on stools.  Taking Metamucil and probiotic.  Followed by cardiology for CHF.  States unable to take torsemide and potassium daily.  May take torsemide twice a week.  Had difficulty absorbing potassium, liquid potassium is expensive.  Unsure if having cramping and low back.  Endorses LE edema, SOB with going up stairs.  Unable to lie flat at night 2/2 h/o GERD. On acid reflux medicine.  Previously seen by GI, but would like a 2nd opinion.  Past Medical History:  Diagnosis Date  . Asthma   . Blood in stool   . Colon polyps   . Diabetes mellitus without complication ([place])   . Diverticulitis   . Fibromyalgia   . GERD (gastroesophageal reflux disease)   . Gout   . Hypercholesteremia   . Hypertension   . Pneumonia   . Pneumonia   . UTI (urinary tract infection)     Allergies  Allergen Reactions  . Strawberry (Diagnostic) Anaphylaxis and Other (See Comments)    hives  . Penicillins Itching    Has patient had a PCN reaction causing immediate rash, facial/tongue/throat swelling, SOB or lightheadedness with hypotension: No Has patient had a PCN reaction causing severe rash involving mucus membranes or skin necrosis: No Has patient had a PCN reaction that required hospitalization: Unknown Has patient had a PCN reaction occurring within the last 10 years: No If  all of the above answers are "NO", then may proceed with Cephalosporin use.  . Tape Other (See Comments)    Blisters  . Gabapentin Itching, Rash and Other (See Comments)    Incoherent   Family History  Problem Relation Age of Onset  . Heart failure Mother   . Gout Mother   . Hypertension Mother   . Diabetes Mother   . Heart failure Father   . Hypertension Father   . AAA (abdominal aortic aneurysm) Father   . Gout Brother   . Hypertension Brother   . Heart attack Brother   . Colon cancer Maternal Uncle   . Crohn's disease Paternal Aunt   . Breast cancer Neg Hx     ROS General: Denies fever, chills, night sweats, changes in weight, changes in appetite HEENT: Denies headaches, ear pain, changes in vision, rhinorrhea, sore throat CV: Denies CP, palpitations, SOB, orthopnea Pulm: Denies SOB, cough, wheezing GI: Denies abdominal pain, nausea, vomiting, diarrhea, constipation  + BRBPR, history of hemorrhoids status post banding malodorous stools, abdominal pain GU: Denies dysuria, hematuria, vaginal discharge  + frequency, urgency Msk: Denies muscle cramps, joint pains  + back pain Neuro: Denies weakness, numbness, tingling Skin: Denies rashes, bruising Psych: Denies depression, anxiety, hallucinations     Objective:    Blood pressure 140/90, pulse 71, temperature 98.9 F (37.2  C), temperature source Oral, weight 233 lb 9.6 oz (106 kg), SpO2 95 %.  Gen. Pleasant, well-nourished, in no distress, normal affect   HEENT: [place]/AT, face symmetric, conjunctiva clear, no scleral icterus, PERRLA, EOMI, nares patent without drainage, pharynx without erythema or exudate. Neck: No JVD, no thyromegaly, no carotid bruits Lungs: no accessory muscle use, CTAB, no wheezes or rales Cardiovascular: RRR, no m/r/g, no peripheral edema Abdomen: BS present, soft, NT/ND, no hepatosplenomegaly. Musculoskeletal: No deformities, no cyanosis or clubbing, normal tone Neuro:  A&Ox3, CN II-XII intact, normal  gait Skin:  Warm, no lesions/ rash   Wt Readings from Last 3 Encounters:  09/24/20 233 lb 9.6 oz (106 kg)  06/06/20 229 lb (103.9 kg)  03/02/20 220 lb 3.2 oz (99.9 kg)    Lab Results  Component Value Date   WBC 5.9 06/29/2019   HGB 14.2 06/29/2019   HCT 43.1 06/29/2019   PLT 256.0 06/29/2019   GLUCOSE 128 (H) 02/29/2020   CHOL 191 02/29/2020   TRIG 131 02/29/2020   HDL 59 02/29/2020   LDLCALC 109 (H) 02/29/2020   ALT 12 02/29/2020   AST 12 02/29/2020   NA 144 02/29/2020   K 3.6 02/29/2020   CL 100 02/29/2020   CREATININE 0.80 02/29/2020   BUN 21 02/29/2020   CO2 30 (H) 02/29/2020   TSH 0.552 03/02/2019   HGBA1C 7.1 (H) 02/29/2020    Assessment/Plan:  Other chronic back pain  -will obtain UA to r/o UTI and assess for hematuria as renal calculi possible  - Plan: POCT urinalysis dipstick  Essential hypertension  -uncontrolled -lifestyle modifications -encouraged to continue current meds -f/u with Cardiology - Plan: Basic metabolic panel, Magnesium  Hemorrhoids, unspecified hemorrhoid type  -stable -h/o banding x 3  -continue bowel regimen -OTC hemorrhoid cream prn - Plan: Ambulatory referral to Gastroenterology, CBC with Differential/Platelet  BRBPR (bright red blood per rectum)  -stable -concern for worsening hemorrhoids.  Also consider anal fissure. -will obtain CBC to assess for anemia -given strict precautions - Plan: Ambulatory referral to Gastroenterology, CBC with Differential/Platelet  Urinary urgency  - Plan: POCT urinalysis dipstick, Hemoglobin X3K  Chronic systolic CHF (congestive heart failure) ([place]) -concern for CHF exacerbation given increased SOB -will benefit from taking lasix more frequently. -discussed limiting sodium intake, elevating LEs, compression socks or TED hose -continue current meds. -f/u with Cardiology - Plan: Brain Natriuretic Peptide  Gastroesophageal reflux disease, unspecified whether esophagitis present  -avoid  foods known to cause symptoms -continue omeprazole 40 mg.  Can increase to BID. - Plan: Ambulatory referral to Gastroenterology, Vitamin B12  F/u prn  Grier Mitts, MD

## 2020-09-30 ENCOUNTER — Other Ambulatory Visit: Payer: Self-pay | Admitting: Pulmonary Disease

## 2020-09-30 DIAGNOSIS — J4540 Moderate persistent asthma, uncomplicated: Secondary | ICD-10-CM

## 2020-09-30 DIAGNOSIS — R058 Other specified cough: Secondary | ICD-10-CM

## 2020-09-30 DIAGNOSIS — R053 Chronic cough: Secondary | ICD-10-CM

## 2020-10-07 ENCOUNTER — Encounter: Payer: Self-pay | Admitting: Family Medicine

## 2020-10-18 ENCOUNTER — Telehealth: Payer: Self-pay | Admitting: Pulmonary Disease

## 2020-10-18 NOTE — Telephone Encounter (Signed)
Left message for patient to call back  

## 2020-10-18 NOTE — Telephone Encounter (Signed)
pt is calling to see if Dr. Halford Chessman refill her proair HFA. last refilled in 2017 by previous provider.  pt mentions she has at home [place], but needs assist on how to use it . Pt mention that the pollen has cause her to need her rescue inhaler  Pt wondering would be an issue for her to take her [place] if she unable to get proair filled . Pt would need the rx [facility]   -  [phone]

## 2020-10-27 ENCOUNTER — Encounter (HOSPITAL_COMMUNITY): Payer: Self-pay | Admitting: Emergency Medicine

## 2020-10-27 ENCOUNTER — Inpatient Hospital Stay (HOSPITAL_COMMUNITY)
Admission: EM | Admit: 2020-10-27 | Discharge: 2020-10-29 | DRG: 291 | Disposition: A | Discharge status: Home / Self Care | Payer: Medicare Other | Attending: Internal Medicine | Admitting: Internal Medicine

## 2020-10-27 ENCOUNTER — Other Ambulatory Visit: Payer: Self-pay

## 2020-10-27 DIAGNOSIS — E876 Hypokalemia: Secondary | ICD-10-CM | POA: Diagnosis present

## 2020-10-27 DIAGNOSIS — E669 Obesity, unspecified: Secondary | ICD-10-CM

## 2020-10-27 DIAGNOSIS — Z87891 Personal history of nicotine dependence: Secondary | ICD-10-CM

## 2020-10-27 DIAGNOSIS — E559 Vitamin D deficiency, unspecified: Secondary | ICD-10-CM | POA: Diagnosis present

## 2020-10-27 DIAGNOSIS — Z6841 Body Mass Index (BMI) 40.0 and over, adult: Secondary | ICD-10-CM

## 2020-10-27 DIAGNOSIS — R062 Wheezing: Secondary | ICD-10-CM | POA: Diagnosis not present

## 2020-10-27 DIAGNOSIS — Z8249 Family history of ischemic heart disease and other diseases of the circulatory system: Secondary | ICD-10-CM

## 2020-10-27 DIAGNOSIS — E7800 Pure hypercholesterolemia, unspecified: Secondary | ICD-10-CM | POA: Diagnosis present

## 2020-10-27 DIAGNOSIS — M109 Gout, unspecified: Secondary | ICD-10-CM | POA: Diagnosis present

## 2020-10-27 DIAGNOSIS — Z91018 Allergy to other foods: Secondary | ICD-10-CM | POA: Diagnosis not present

## 2020-10-27 DIAGNOSIS — I110 Hypertensive heart disease with heart failure: Principal | ICD-10-CM | POA: Diagnosis present

## 2020-10-27 DIAGNOSIS — Z86010 Personal history of colonic polyps: Secondary | ICD-10-CM | POA: Diagnosis not present

## 2020-10-27 DIAGNOSIS — Z880 Allergy status to penicillin: Secondary | ICD-10-CM

## 2020-10-27 DIAGNOSIS — K219 Gastro-esophageal reflux disease without esophagitis: Secondary | ICD-10-CM | POA: Diagnosis present

## 2020-10-27 DIAGNOSIS — J4520 Mild intermittent asthma, uncomplicated: Secondary | ICD-10-CM | POA: Diagnosis present

## 2020-10-27 DIAGNOSIS — R059 Cough, unspecified: Secondary | ICD-10-CM | POA: Diagnosis not present

## 2020-10-27 DIAGNOSIS — E119 Type 2 diabetes mellitus without complications: Secondary | ICD-10-CM | POA: Diagnosis present

## 2020-10-27 DIAGNOSIS — Z91048 Other nonmedicinal substance allergy status: Secondary | ICD-10-CM

## 2020-10-27 DIAGNOSIS — I160 Hypertensive urgency: Secondary | ICD-10-CM

## 2020-10-27 DIAGNOSIS — Z79899 Other long term (current) drug therapy: Secondary | ICD-10-CM | POA: Diagnosis not present

## 2020-10-27 DIAGNOSIS — I5033 Acute on chronic diastolic (congestive) heart failure: Secondary | ICD-10-CM | POA: Diagnosis present

## 2020-10-27 DIAGNOSIS — R9431 Abnormal electrocardiogram [ECG] [EKG]: Secondary | ICD-10-CM | POA: Diagnosis not present

## 2020-10-27 DIAGNOSIS — I161 Hypertensive emergency: Secondary | ICD-10-CM | POA: Diagnosis present

## 2020-10-27 DIAGNOSIS — Z833 Family history of diabetes mellitus: Secondary | ICD-10-CM | POA: Diagnosis not present

## 2020-10-27 DIAGNOSIS — Z87440 Personal history of urinary (tract) infections: Secondary | ICD-10-CM

## 2020-10-27 DIAGNOSIS — E1169 Type 2 diabetes mellitus with other specified complication: Secondary | ICD-10-CM | POA: Diagnosis not present

## 2020-10-27 DIAGNOSIS — I5030 Unspecified diastolic (congestive) heart failure: Secondary | ICD-10-CM

## 2020-10-27 DIAGNOSIS — Z9049 Acquired absence of other specified parts of digestive tract: Secondary | ICD-10-CM | POA: Diagnosis not present

## 2020-10-27 DIAGNOSIS — T502X5A Adverse effect of carbonic-anhydrase inhibitors, benzothiadiazides and other diuretics, initial encounter: Secondary | ICD-10-CM | POA: Diagnosis not present

## 2020-10-27 DIAGNOSIS — Z20822 Contact with and (suspected) exposure to covid-19: Secondary | ICD-10-CM | POA: Diagnosis present

## 2020-10-27 DIAGNOSIS — Z800 Family history of malignant neoplasm of digestive organs: Secondary | ICD-10-CM

## 2020-10-27 DIAGNOSIS — Z888 Allergy status to other drugs, medicaments and biological substances status: Secondary | ICD-10-CM

## 2020-10-27 DIAGNOSIS — M797 Fibromyalgia: Secondary | ICD-10-CM | POA: Diagnosis present

## 2020-10-27 DIAGNOSIS — I5043 Acute on chronic combined systolic (congestive) and diastolic (congestive) heart failure: Secondary | ICD-10-CM | POA: Diagnosis present

## 2020-10-27 LAB — CBC
HCT: 43.1 % (ref 36.0–46.0)
HCT: 42.6 % (ref 36.0–46.0)
Hemoglobin: 13.9 g/dL (ref 12.0–15.0)
Hemoglobin: 13.5 g/dL (ref 12.0–15.0)
MCH: 28.3 pg (ref 26.0–34.0)
MCH: 28.1 pg (ref 26.0–34.0)
MCHC: 32.3 g/dL (ref 30.0–36.0)
MCHC: 31.7 g/dL (ref 30.0–36.0)
MCV: 87.6 fL (ref 80.0–100.0)
MCV: 88.6 fL (ref 80.0–100.0)
Platelets: 263 10*3/uL (ref 150–400)
Platelets: 268 10*3/uL (ref 150–400)
RBC: 4.92 MIL/uL (ref 3.87–5.11)
RBC: 4.81 MIL/uL (ref 3.87–5.11)
RDW: 13.3 % (ref 11.5–15.5)
RDW: 13.5 % (ref 11.5–15.5)
WBC: 7.3 10*3/uL (ref 4.0–10.5)
WBC: 7.3 10*3/uL (ref 4.0–10.5)
nRBC: 0 % (ref 0.0–0.2)
nRBC: 0 % (ref 0.0–0.2)

## 2020-10-27 LAB — BASIC METABOLIC PANEL
Anion gap: 8 (ref 5–15)
BUN: 14 mg/dL (ref 8–23)
CO2: 29 mmol/L (ref 22–32)
Calcium: 9.6 mg/dL (ref 8.9–10.3)
Chloride: 104 mmol/L (ref 98–111)
Creatinine, Ser: 0.75 mg/dL (ref 0.44–1.00)
GFR, Estimated: >60 mL/min (ref >60)
Glucose, Bld: 162 mg/dL — ABNORMAL HIGH (ref 70–99)
Potassium: 3.3 mmol/L — ABNORMAL LOW (ref 3.5–5.1)
Sodium: 141 mmol/L (ref 135–145)

## 2020-10-27 LAB — CREATININE, SERUM
Creatinine, Ser: 0.75 mg/dL (ref 0.44–1.00)
GFR, Estimated: >60 mL/min (ref >60)

## 2020-10-27 LAB — LIPID PANEL
Cholesterol: 233 mg/dL — ABNORMAL HIGH (ref 0–200)
HDL: 60 mg/dL (ref >40)
LDL Cholesterol: 141 mg/dL — ABNORMAL HIGH (ref 0–99)
Total CHOL/HDL Ratio: 3.9 RATIO
Triglycerides: 162 mg/dL — ABNORMAL HIGH (ref <150)
VLDL: 32 mg/dL (ref 0–40)

## 2020-10-27 LAB — RESP PANEL BY RT-PCR (FLU A&B, COVID) ARPGX2
Influenza A by PCR: NEGATIVE
Influenza B by PCR: NEGATIVE
SARS Coronavirus 2 by RT PCR: NEGATIVE

## 2020-10-27 LAB — HIV ANTIBODY (ROUTINE TESTING W REFLEX): HIV Screen 4th Generation wRfx: NONREACTIVE

## 2020-10-27 LAB — CBG MONITORING, ED: Glucose-Capillary: 168 mg/dL — ABNORMAL HIGH (ref 70–99)

## 2020-10-27 LAB — TROPONIN I (HIGH SENSITIVITY)
Troponin I (High Sensitivity): 14 ng/L (ref <18)
Troponin I (High Sensitivity): 15 ng/L (ref <18)

## 2020-10-27 LAB — BRAIN NATRIURETIC PEPTIDE: B Natriuretic Peptide: 489.8 pg/mL — ABNORMAL HIGH (ref 0.0–100.0)

## 2020-10-27 LAB — MAGNESIUM: Magnesium: 1.7 mg/dL (ref 1.7–2.4)

## 2020-10-27 MED ORDER — FUROSEMIDE 10 MG/ML IJ SOLN
60.0000 mg | Freq: Once | INTRAMUSCULAR | Status: AC
  Administered 2020-10-27: 60 mg via INTRAVENOUS
  Filled 2020-10-27: qty 6

## 2020-10-27 MED ORDER — ENOXAPARIN SODIUM 40 MG/0.4ML IJ SOSY
40.0000 mg | PREFILLED_SYRINGE | INTRAMUSCULAR | Status: DC
  Administered 2020-10-28: 40 mg via SUBCUTANEOUS
  Filled 2020-10-27: qty 0.4

## 2020-10-27 MED ORDER — ACETAMINOPHEN 325 MG PO TABS
650.0000 mg | ORAL_TABLET | ORAL | Status: DC | PRN
  Administered 2020-10-28 (×2): 650 mg via ORAL
  Filled 2020-10-27 (×2): qty 2

## 2020-10-27 MED ORDER — INSULIN ASPART 100 UNIT/ML IJ SOLN: 0.0000 [IU] | Freq: Every day | INTRAMUSCULAR | Status: DC

## 2020-10-27 MED ORDER — INSULIN ASPART 100 UNIT/ML IJ SOLN
0.0000 [IU] | Freq: Three times a day (TID) | INTRAMUSCULAR | Status: DC
Start: 2020-10-28 — End: 2020-10-29

## 2020-10-27 MED ORDER — SACUBITRIL-VALSARTAN 49-51 MG PO TABS
1.0000 | ORAL_TABLET | Freq: Two times a day (BID) | ORAL | Status: DC
  Administered 2020-10-28 – 2020-10-29 (×3): 1 via ORAL
  Filled 2020-10-27 (×4): qty 1

## 2020-10-27 MED ORDER — POTASSIUM CHLORIDE CRYS ER 20 MEQ PO TBCR
20.0000 meq | EXTENDED_RELEASE_TABLET | Freq: Two times a day (BID) | ORAL | Status: DC
  Administered 2020-10-27: 20 meq via ORAL
  Filled 2020-10-27: qty 1

## 2020-10-27 MED ORDER — SODIUM CHLORIDE 0.9% FLUSH: 3.0000 mL | INTRAVENOUS | Status: DC | PRN

## 2020-10-27 MED ORDER — ALBUTEROL SULFATE HFA 108 (90 BASE) MCG/ACT IN AERS
2.0000 | INHALATION_SPRAY | RESPIRATORY_TRACT | Status: DC | PRN
  Filled 2020-10-27: qty 6.7

## 2020-10-27 MED ORDER — IPRATROPIUM-ALBUTEROL 0.5-2.5 (3) MG/3ML IN SOLN: 3.0000 mL | Freq: Four times a day (QID) | RESPIRATORY_TRACT | Status: DC | PRN

## 2020-10-27 MED ORDER — MONTELUKAST SODIUM 10 MG PO TABS
10.0000 mg | ORAL_TABLET | Freq: Every day | ORAL | Status: DC
  Administered 2020-10-28 – 2020-10-29 (×2): 10 mg via ORAL
  Filled 2020-10-27 (×2): qty 1

## 2020-10-27 MED ORDER — POTASSIUM CHLORIDE 20 MEQ PO PACK
40.0000 meq | PACK | Freq: Once | ORAL | Status: AC
  Administered 2020-10-27: 40 meq via ORAL
  Filled 2020-10-27: qty 2

## 2020-10-27 MED ORDER — MAGNESIUM OXIDE -MG SUPPLEMENT 400 (240 MG) MG PO TABS
400.0000 mg | ORAL_TABLET | Freq: Every day | ORAL | Status: DC
  Administered 2020-10-28 – 2020-10-29 (×2): 400 mg via ORAL
  Filled 2020-10-27 (×2): qty 1

## 2020-10-27 MED ORDER — POTASSIUM CHLORIDE 10 MEQ/100ML IV SOLN
10.0000 meq | Freq: Once | INTRAVENOUS | Status: AC
  Administered 2020-10-27: 10 meq via INTRAVENOUS
  Filled 2020-10-27: qty 100

## 2020-10-27 MED ORDER — ASPIRIN EC 81 MG PO TBEC
81.0000 mg | DELAYED_RELEASE_TABLET | Freq: Every day | ORAL | Status: DC
  Administered 2020-10-28 – 2020-10-29 (×2): 81 mg via ORAL
  Filled 2020-10-27 (×2): qty 1

## 2020-10-27 MED ORDER — CARVEDILOL 25 MG PO TABS
25.0000 mg | ORAL_TABLET | Freq: Two times a day (BID) | ORAL | Status: DC
  Administered 2020-10-27 – 2020-10-29 (×4): 25 mg via ORAL
  Filled 2020-10-27 (×3): qty 1
  Filled 2020-10-27: qty 2

## 2020-10-27 MED ORDER — FAMOTIDINE 20 MG PO TABS
40.0000 mg | ORAL_TABLET | Freq: Every evening | ORAL | Status: DC
  Administered 2020-10-28: 40 mg via ORAL
  Filled 2020-10-27: qty 2

## 2020-10-27 MED ORDER — FLUTICASONE FUROATE-VILANTEROL 200-25 MCG/INH IN AEPB
1.0000 | INHALATION_SPRAY | Freq: Every day | RESPIRATORY_TRACT | Status: DC
  Administered 2020-10-28 – 2020-10-29 (×2): 1 via RESPIRATORY_TRACT
  Filled 2020-10-27 (×2): qty 28

## 2020-10-27 MED ORDER — FUROSEMIDE 10 MG/ML IJ SOLN
60.0000 mg | Freq: Two times a day (BID) | INTRAMUSCULAR | Status: AC
  Administered 2020-10-27 – 2020-10-28 (×3): 60 mg via INTRAVENOUS
  Filled 2020-10-27 (×3): qty 6

## 2020-10-27 MED ORDER — SODIUM CHLORIDE 0.9% FLUSH
3.0000 mL | Freq: Two times a day (BID) | INTRAVENOUS | Status: DC
  Administered 2020-10-28 – 2020-10-29 (×2): 3 mL via INTRAVENOUS

## 2020-10-27 MED ORDER — SODIUM CHLORIDE 0.9 % IV SOLN: 250.0000 mL | INTRAVENOUS | Status: DC | PRN

## 2020-10-27 MED ORDER — NITROGLYCERIN 0.4 MG SL SUBL: 0.4000 mg | SUBLINGUAL_TABLET | Freq: Once | SUBLINGUAL | Status: DC

## 2020-10-27 MED ORDER — NITROGLYCERIN IN D5W 200-5 MCG/ML-% IV SOLN
0.0000 ug/min | INTRAVENOUS | Status: DC
  Administered 2020-10-27: 5 ug/min via INTRAVENOUS
  Administered 2020-10-28: 50 ug/min via INTRAVENOUS
  Filled 2020-10-27 (×2): qty 250

## 2020-10-27 NOTE — ED Provider Notes (Signed)
Emergency Medicine Provider Triage Evaluation Note  Beth Macias , a 70 y.o. female  was evaluated in triage.  Pt complains of SOB since May 30th and intermittent chest pain under bilateral breasts feels like sharp tinge x 1 day. She has been taking neb treatments without relief.  Went to urgent care prior to arrival and concern for CHF exacerbation. Has had bilateral leg swelling x 1 day, on lasix.  Review of Systems  Positive: Shortness of breath, chest pain Negative: Fever, back pain  Physical Exam  BP (!) 225/116 (BP Location: Left Arm)   Pulse 75   Temp 98.9 F (37.2 C)   Resp 16   SpO2 98%  Gen:   Awake, no distress   Resp:  Normal effort  MSK:   Moves extremities without difficulty  Other:  Bilateral lower extremity edema  Medical Decision Making  Medically screening exam initiated at 5:23 PM.  Appropriate orders placed.  Beth Macias was informed that the remainder of the evaluation will be completed by another provider, this initial triage assessment does not replace that evaluation, and the importance of remaining in the ED until their evaluation is complete.  Patient hypertensive in triage. EKG and lab work was ordered by triage RN. BNP added to labs.  Chest x-ray was canceled as patient has paperwork at the bedside from urgent care with report of x-ray she had from the urgent care.  Patient would like to try to find it in our system before repeating the image.   Portions of this note were generated with [employer]. Dictation errors may occur despite best attempts at proofreading.    Barrie Folk, PA-C 10/27/20 1732    Valarie Merino, MD 10/28/20 [phone]

## 2020-10-27 NOTE — ED Triage Notes (Signed)
Pt reports exertional SOB and tightness to L chest and under bilateral breast x 3-4 days that she thought was related to asthma.  Using nebulizer without relief.  SOB becoming more constant.

## 2020-10-27 NOTE — H&P (Addendum)
History and Physical    Beth Macias BDZ:[id number] DOB: 1950/09/14 DOA: 10/27/2020  PCP: Beth Ruddy, MD   Patient coming from:  Home  Chief Complaint: SOB  HPI: Beth Macias is a 70 y.o. female with medical history significant for HFpEF, HTN, DMT2, mild intermittent asthma, morbid obesity who presents for evaluation of SOB. She reports that she started to have SOB 4 days ago. SOB exacerbated by activity and exertion and feels smothered when laying flat.  Reports intermittent sharp chest pain across her entire chest region and around her sides into her upper back.  She has not had any palpitations or chest pressure.  She denies any recent exposures to fumes, chemicals, smoke or animals.  She does say when the pollen is heavy it causes her congestion.  She denies any abdominal pain, nausea, vomiting, diarrhea, rash, syncope.  She has no history of PE or DVTs.  She has tried using her albuterol at home with only minimal relief.  Went to an urgent care this morning and was given a breathing treatment with albuterol for wheezing.  Chest x-ray there revealed cardiomegaly and bilateral interstitial edema.  COVID-19 test was negative.  She was then sent to the hospital for further evaluation as her systolic blood pressure was greater than 210.  She denies tobacco, alcohol, illicit drug use.  ED Course:  Beth Macias presented with BP in the 225-250/112-116 range and was started on NTG infusion.  BNP was found to be elevated.  With pulmonary edema on the chest x-ray she was started on BiPAP therapy which she is tolerating well.  Pressure has improved on the nitroglycerin infusion.  Troponin level was 14 and then 15 in the emergency room. BNP elevated at 489.8.  CBC is unremarkable.  Sodium 141, potassium 3.3, chloride 104, bicarb 29, glucose 162, BUN 14, creatinine 0.75, magnesium 1.7.  Hospitalist service asked to admit patient for further management  Review of Systems:  General: Denies  fever, chills, weight loss, night sweats. Denies dizziness. Denies change in appetite HENT: Denies head trauma, headache, denies change in hearing, tinnitus. Denies nasal bleeding. Denies sore throat, Denies difficulty swallowing Eyes: Denies blurry vision, pain in eye, drainage.  Denies discoloration of eyes. Neck: Denies pain.  Denies swelling.  Denies pain with movement. Cardiovascular: Reports bilateral chest pain. Denies palpitations. Has edema legs. Reports orthopnea Respiratory: Reports shortness of breath, cough. Reports intermittent wheezing.  Denies sputum production Gastrointestinal: Denies abdominal pain, swelling. Denies nausea, vomiting, diarrhea. Denies melena.  Denies hematemesis. Musculoskeletal: Denies limitation of movement. Denies deformity or swelling. Denies arthralgias or myalgias. Genitourinary: Denies pelvic pain.  Denies urinary frequency or hesitancy.  Denies dysuria.  Skin: Denies rash.  Denies petechiae, purpura, ecchymosis. Neurological: Denies headache. Denies syncope. Denies seizure activity. Denies paresthesia. Denies slurred speech, drooping face.  Denies visual change. Psychiatric: Denies depression, anxiety. Denies hallucinations.  Past Medical History:  Diagnosis Date  . Asthma   . Blood in stool   . Colon polyps   . Diabetes mellitus without complication ([place])   . Diverticulitis   . Fibromyalgia   . GERD (gastroesophageal reflux disease)   . Gout   . Hypercholesteremia   . Hypertension   . Pneumonia   . Pneumonia   . UTI (urinary tract infection)     Past Surgical History:  Procedure Laterality Date  . APPENDECTOMY    . CESAREAN SECTION     x 3  . LIPOMA EXCISION    . MENISCUS REPAIR  Left   . OVARIAN CYST REMOVAL    . OVARIAN CYST SURGERY      Social History  reports that she quit smoking about 44 years ago. Her smoking use included cigarettes. She has a 2.25 pack-year smoking history. She has never used smokeless tobacco. She reports  that she does not drink alcohol and does not use drugs.  Allergies  Allergen Reactions  . Strawberry (Diagnostic) Anaphylaxis and Other (See Comments)    hives  . Penicillins Itching    Has patient had a PCN reaction causing immediate rash, facial/tongue/throat swelling, SOB or lightheadedness with hypotension: No Has patient had a PCN reaction causing severe rash involving mucus membranes or skin necrosis: No Has patient had a PCN reaction that required hospitalization: Unknown Has patient had a PCN reaction occurring within the last 10 years: No If all of the above answers are "NO", then may proceed with Cephalosporin use.  . Tape Other (See Comments)    Blisters  . Gabapentin Itching, Rash and Other (See Comments)    Incoherent    Family History  Problem Relation Age of Onset  . Heart failure Mother   . Gout Mother   . Hypertension Mother   . Diabetes Mother   . Heart failure Father   . Hypertension Father   . AAA (abdominal aortic aneurysm) Father   . Gout Brother   . Hypertension Brother   . Heart attack Brother   . Colon cancer Maternal Uncle   . Crohn's disease Paternal Aunt   . Breast cancer Neg Hx      Prior to Admission medications   Medication Sig Start Date End Date Taking? Authorizing Provider  albuterol (VENTOLIN HFA) 108 (90 Base) MCG/ACT inhaler Inhale 2 puffs into the lungs every 6 (six) hours as needed for wheezing or shortness of breath.   Yes [provider]  allopurinol (ZYLOPRIM) 100 MG tablet Take 1 tablet (100 mg total) by mouth daily. Patient taking differently: Take 100 mg by mouth daily as needed (gout attacks). 02/27/20  Yes Hyatt, Max T, DPM  B Complex Vitamins (VITAMIN B COMPLEX) TABS Take 1 tablet by mouth daily.   Yes [provider]  carvedilol (COREG) 25 MG tablet Take 1 tablet (25 mg total) by mouth 2 (two) times daily. 07/12/20  Yes Josue Hector, MD  cholecalciferol (VITAMIN D) 25 MCG (1000 UNIT) tablet Take 1,000 Units  by mouth at bedtime.    Yes [provider]  famotidine (PEPCID) 40 MG tablet Take 40 mg by mouth every evening. 11/02/19  Yes [provider]  fluticasone-salmeterol (ADVAIR) 250-50 MCG/ACT AEPB Inhale 1 puff into the lungs 2 (two) times daily. 10/25/20  Yes [provider]  ipratropium-albuterol (DUONEB) 0.5-2.5 (3) MG/3ML SOLN Take 3 mLs by nebulization every 6 (six) hours as needed (shortness of breath/wheezing).   Yes [provider]  levocetirizine (XYZAL) 5 MG tablet Take 5 mg by mouth daily.   Yes [provider]  magnesium oxide (MAG-OX) 400 MG tablet Take 400 mg by mouth daily.   Yes [provider]  montelukast (SINGULAIR) 10 MG tablet TAKE 1 TABLET BY MOUTH  DAILY Patient taking differently: Take 10 mg by mouth daily. 10/01/20  Yes Chesley Mires, MD  omeprazole (PRILOSEC) 40 MG capsule Take 1 capsule (40 mg total) by mouth daily. 05/15/20  Yes Chesley Mires, MD  potassium chloride 20 MEQ/15ML (10%) SOLN Take 15 mLs (20 mEq total) by mouth daily. Takes only when taking prn  torsemide Patient taking differently: Take 20 mEq by mouth daily as needed (with each dose of torsemide). 07/12/20  Yes Josue Hector, MD  sacubitril-valsartan (ENTRESTO) 49-51 MG Take 1 tablet by mouth 2 (two) times daily. 01/13/19  Yes Josue Hector, MD  torsemide (DEMADEX) 20 MG tablet Take 1 tablet (20 mg total) by mouth daily. Patient taking differently: Take 20 mg by mouth daily as needed (swelling/fluid/weight gain). 07/12/20  Yes Josue Hector, MD  vitamin C (ASCORBIC ACID) 500 MG tablet Take 500 mg by mouth daily.   Yes [provider]  Febuxostat 80 MG TABS Take 1 tablet (80 mg total) by mouth daily. Patient not taking: No sig reported 02/22/20   Hyatt, Max T, DPM  glucose blood (ONETOUCH ULTRA) test strip Use to check blood sugar 4-5 times daily 10/21/18   Beth Ruddy, MD    Physical Exam: Vitals:   10/27/20 1901 10/27/20 2033 10/27/20 2045  10/27/20 2130  BP: (!) 250/112 (!) 153/111 (!) 194/123 (!) 165/104  Pulse: 72 89 88 91  Resp: (!) 24 (!) 23 (!) 24 18  Temp:      SpO2: 100% 100% 100% 100%    Constitutional: NAD, calm, comfortable Vitals:   10/27/20 1901 10/27/20 2033 10/27/20 2045 10/27/20 2130  BP: (!) 250/112 (!) 153/111 (!) 194/123 (!) 165/104  Pulse: 72 89 88 91  Resp: (!) 24 (!) 23 (!) 24 18  Temp:      SpO2: 100% 100% 100% 100%   General: WDWN, pleasant female, Alert and oriented x3.  Eyes: EOMI, PERRL, conjunctivae normal. Sclera nonicteric HENT:  [place]/AT, external ears normal.  Nares patent without epistasis.  No lesions to external mouth. Bipap mask in place. Neck: Soft, normal range of motion, supple, no masses, no thyromegaly.  Trachea midline Respiratory: Equal breath sounds with diffuse rales and bibasilar crackles. Mild expiratory wheezing, Normal respiratory effort. No accessory muscle use.  Cardiovascular: Regular rate and rhythm, no murmurs / rubs / gallops. +1 pedal edema. 1+ pedal pulses.  Abdomen: Soft, no tenderness, nondistended, no rebound or guarding. Morbidly obese. No masses palpated. Bowel sounds normoactive Musculoskeletal: FROM. no cyanosis. No joint deformity upper and lower extremities. Normal muscle tone.  Skin: Warm, dry, intact no rashes, lesions, ulcers. No induration Neurologic: CN 2-12 grossly intact.  Normal speech.  Sensation intact to touch. Strength 5/5 in all extremities.   Psychiatric: Normal judgment and insight.  Normal mood.    Labs on Admission: I have personally reviewed following labs and imaging studies  CBC: Recent Labs  Lab 10/27/20 1708  WBC 7.3  HGB 13.9  HCT 43.1  MCV 87.6  PLT 782    Basic Metabolic Panel: Recent Labs  Lab 10/27/20 1708  NA 141  K 3.3*  CL 104  CO2 29  GLUCOSE 162*  BUN 14  CREATININE 0.75  CALCIUM 9.6    GFR: CrCl cannot be calculated (Unknown ideal weight.).  Liver Function Tests: No results for input(s): AST, ALT,  ALKPHOS, BILITOT, PROT, ALBUMIN in the last 168 hours.  Urine analysis:    Component Value Date/Time   BILIRUBINUR neg 09/24/2020 1349   PROTEINUR Positive (A) 09/24/2020 1349   UROBILINOGEN negative (A) 09/24/2020 1349   NITRITE neg 09/24/2020 1349   LEUKOCYTESUR Small (1+) (A) 09/24/2020 1349    Radiological Exams on Admission: No results found.  EKG: Independently reviewed.  EKG shows normal sinus rhythm with nonspecific ST changes laterally.  No acute ST elevation or depression.  LVH by voltage criteria. QTc 483.   Assessment/Plan Principal Problem:   Acute on chronic heart failure with preserved ejection fraction (HFpEF) Ms. Kinkade is admitted to progressive care unit.  Diurese with Lasix.  Monitor I&Os and daily weights.  Troponin negative twice in the emergency room. She was placed on BiPAP in the emergency room which she is tolerating well.  She was seen in urgent care and had a chest x-ray there that showed cardiomegaly with interstitial edema consistent with CHF.  BNP level is elevated. Patient had echocardiogram in March 2022 which showed an EF of 60 to 65% with LVH and diastolic dysfunction. Continue home medications of Entresto, Coreg Check lipid panel.  Active Problems:   Hypertensive urgency BP significantly elevated when she presented.  Blood pressure was225-250/112-116.  She was started on nitroglycerin infusion.  Monitor blood pressure. Continue Entresto and Coreg.    Diabetes mellitus type 2 in obese  Patient is diet controlled and had a hemoglobin A1c of 7.0 a few days ago. Patient placed on a heart healthy diabetic diet. Check blood sugars with meals and at bedtime. SSI provided as needed for glycemic control    Hypokalemia Patient had potassium supplementation in the emergency room.  Magnesium level is ordered and is reported at 1.7 Recheck electrolytes and renal function in morning    Mild intermittent asthma without complication Patient uses  Advair at home.  Advair changed to [facility] while in the hospital due to hospital formulary. Albuterol as needed for cough, wheeze, shortness of breath.    Morbid obesity with BMI of 40.0-44.9, adult ([place]) Follow-up with PCP for dietary, lifestyle, pharmacotherapy interventions for long-term weight loss.  Patient may also benefit from referral to bariatric surgery for evaluation    DVT prophylaxis: Lovenox for DVT prophylaxis.  Code Status:   Full Code  Family Communication:  Diagnosis and plan discussed with patient and her daughter who is at bedside.  They verbalized understanding and agree with plan.  Questions answered.  Further recommendations to follow as clinically indicated Disposition Plan:   Patient is from:  Home  Anticipated DC to:  Home  Anticipated DC date:  Anticipate 2 midnight or more stay in the hospital  Anticipated DC barriers: No barriers to discharge identified at this time   Admission status:  Inpatient   Yevonne Aline Nataleah Scioneaux MD [facility]  How to contact the [facility] Attending or Consulting provider [place] or covering provider during after hours [place], for this patient?   1. Check the care team in [facility] and look for a) attending/consulting [facility] provider listed and b) the [facility] team listed 2. Log into [email or web address] and use [place] universal password to access. If you do not have the password, please contact the hospital operator. 3. Locate the [facility] provider you are looking for under [facility] and page to a number that you can be directly reached. 4. If you still have difficulty reaching the provider, please page the [facility] (Director on Call) for the Hospitalists listed on amion for assistance.  10/27/2020, 9:45 PM

## 2020-10-27 NOTE — Progress Notes (Signed)
RT note. Patient taken off bipap at this time. Currently on rm air sat 95%. Will give report to RT covering [place]. Will place patient back on bipap if needed.

## 2020-10-27 NOTE — Progress Notes (Signed)
RT note. Patient placed on bipap at this time per md order. 10/5 40% sat 100% with stable VS. RT will continue to monitor

## 2020-10-27 NOTE — ED Provider Notes (Signed)
[facility] EMERGENCY DEPARTMENT Provider Note   CSN: [id number] Arrival date & time: 10/27/20  1642     History Chief Complaint  Patient presents with  . Chest Pain  . Shortness of Breath    Beth Macias is a 70 y.o. female.  The history is provided by the patient and medical records.  Shortness of Breath Severity:  Moderate Onset quality:  Gradual Duration:  4 days Timing:  Constant Progression:  Worsening Chronicity:  New Context: activity and weather changes   Context: not animal exposure, not emotional upset, not fumes, not known allergens, not occupational exposure, not pollens, not smoke exposure, not strong odors and not URI   Relieved by:  Nothing Worsened by:  Activity, exertion and deep breathing Ineffective treatments:  Inhaler (also tried albuterol nebs at home without improvement) Associated symptoms: chest pain and cough   Associated symptoms: no abdominal pain, no claudication, no diaphoresis, no ear pain, no fever, no headaches, no hemoptysis, no neck pain, no PND, no rash, no sore throat, no sputum production, no syncope, no swollen glands, no vomiting and no wheezing   Risk factors: no hx of PE/DVT   Risk factors comment:  History of CHF, HTN and asthma    Results from UC ([facility]): CXR with cardiomegaly and bilateral intersitial infiltrates, likely pulmonary edema COVID 19 test was negative She was given an albuterol breathing tx for wheezing and sent here for eval. Was hypertensive with systolic around 465 as well.     Past Medical History:  Diagnosis Date  . Asthma   . Blood in stool   . Colon polyps   . Diabetes mellitus without complication ([place])   . Diverticulitis   . Fibromyalgia   . GERD (gastroesophageal reflux disease)   . Gout   . Hypercholesteremia   . Hypertension   . Pneumonia   . Pneumonia   . UTI (urinary tract infection)     Patient Active Problem List   Diagnosis Date Noted  . Acute on  chronic heart failure with preserved ejection fraction (HFpEF) ([place]) 10/27/2020  . Hypertensive urgency 10/27/2020  . Diabetes mellitus type 2 in obese ([place]) 10/27/2020  . CHF (congestive heart failure) ([place]) 12/10/2018  . History of recurrent UTIs 06/30/2018  . Mild intermittent asthma without complication 03/54/6568  . Prediabetes 06/30/2018  . Environmental and seasonal allergies 06/30/2018  . Diabetes ([place]) 04/04/2018  . Upper respiratory infection, viral 04/04/2018  . Urinary tract infection 04/04/2018  . Vitamin D deficiency 08/07/2016  . Morbid obesity with BMI of 40.0-44.9, adult ([place]) 02/18/2016  . Extrinsic asthma without complication 12/75/1700  . SOB (shortness of breath) 06/19/2015  . S/P carpal tunnel release 07/24/2014  . S/P cubital tunnel release 07/24/2014  . Cubital tunnel syndrome, right 07/06/2014  . Right carpal tunnel syndrome 07/06/2014  . Numbness of right hand 05/21/2014  . Hypokalemia 08/16/2013  . Diabetes mellitus type 2, uncomplicated ([place]) 17/49/4496  . GERD (gastroesophageal reflux disease) 08/28/2011  . HTN (hypertension) 08/28/2011    Past Surgical History:  Procedure Laterality Date  . APPENDECTOMY    . CESAREAN SECTION     x 3  . LIPOMA EXCISION    . MENISCUS REPAIR Left   . OVARIAN CYST REMOVAL    . OVARIAN CYST SURGERY       OB History   No obstetric history on file.     Family History  Problem Relation Age of Onset  . Heart failure Mother   .  Gout Mother   . Hypertension Mother   . Diabetes Mother   . Heart failure Father   . Hypertension Father   . AAA (abdominal aortic aneurysm) Father   . Gout Brother   . Hypertension Brother   . Heart attack Brother   . Colon cancer Maternal Uncle   . Crohn's disease Paternal Aunt   . Breast cancer Neg Hx     Social History   Tobacco Use  . Smoking status: Former Smoker    Packs/day: 0.25    Years: 9.00    Pack years: 2.25    Types: Cigarettes    Quit date: 05/26/1976     Years since quitting: 44.4  . Smokeless tobacco: Never Used  Vaping Use  . Vaping Use: Never used  Substance Use Topics  . Alcohol use: Never  . Drug use: Never    Home Medications Prior to Admission medications   Medication Sig Start Date End Date Taking? Authorizing Provider  albuterol (VENTOLIN HFA) 108 (90 Base) MCG/ACT inhaler Inhale 2 puffs into the lungs every 6 (six) hours as needed for wheezing or shortness of breath.   Yes [provider]  allopurinol (ZYLOPRIM) 100 MG tablet Take 1 tablet (100 mg total) by mouth daily. Patient taking differently: Take 100 mg by mouth daily as needed (gout attacks). 02/27/20  Yes Hyatt, Max T, DPM  B Complex Vitamins (VITAMIN B COMPLEX) TABS Take 1 tablet by mouth daily.   Yes [provider]  carvedilol (COREG) 25 MG tablet Take 1 tablet (25 mg total) by mouth 2 (two) times daily. 07/12/20  Yes Josue Hector, MD  cholecalciferol (VITAMIN D) 25 MCG (1000 UNIT) tablet Take 1,000 Units by mouth at bedtime.    Yes [provider]  famotidine (PEPCID) 40 MG tablet Take 40 mg by mouth every evening. 11/02/19  Yes [provider]  fluticasone-salmeterol (ADVAIR) 250-50 MCG/ACT AEPB Inhale 1 puff into the lungs 2 (two) times daily. 10/25/20  Yes [provider]  ipratropium-albuterol (DUONEB) 0.5-2.5 (3) MG/3ML SOLN Take 3 mLs by nebulization every 6 (six) hours as needed (shortness of breath/wheezing).   Yes [provider]  levocetirizine (XYZAL) 5 MG tablet Take 5 mg by mouth daily.   Yes [provider]  magnesium oxide (MAG-OX) 400 MG tablet Take 400 mg by mouth daily.   Yes [provider]  montelukast (SINGULAIR) 10 MG tablet TAKE 1 TABLET BY MOUTH  DAILY Patient taking differently: Take 10 mg by mouth daily. 10/01/20  Yes Chesley Mires, MD  omeprazole (PRILOSEC) 40 MG capsule Take 1 capsule (40 mg total) by mouth daily. 05/15/20  Yes Chesley Mires, MD  potassium chloride 20 MEQ/15ML  (10%) SOLN Take 15 mLs (20 mEq total) by mouth daily. Takes only when taking prn torsemide Patient taking differently: Take 20 mEq by mouth daily as needed (with each dose of torsemide). 07/12/20  Yes Josue Hector, MD  sacubitril-valsartan (ENTRESTO) 49-51 MG Take 1 tablet by mouth 2 (two) times daily. 01/13/19  Yes Josue Hector, MD  torsemide (DEMADEX) 20 MG tablet Take 1 tablet (20 mg total) by mouth daily. Patient taking differently: Take 20 mg by mouth daily as needed (swelling/fluid/weight gain). 07/12/20  Yes Josue Hector, MD  vitamin C (ASCORBIC ACID) 500 MG tablet Take 500 mg by mouth daily.   Yes [provider]  Febuxostat 80 MG TABS Take 1 tablet (80 mg total) by mouth daily. Patient not taking: No sig reported  02/22/20   Hyatt, Max T, DPM  glucose blood (ONETOUCH ULTRA) test strip Use to check blood sugar 4-5 times daily 10/21/18   Billie Ruddy, MD    Allergies    Strawberry (diagnostic), Penicillins, Tape, and Gabapentin  Review of Systems   Review of Systems  Constitutional: Negative for chills, diaphoresis and fever.  HENT: Negative for ear pain and sore throat.   Eyes: Negative for pain and visual disturbance.  Respiratory: Positive for cough, chest tightness and shortness of breath. Negative for hemoptysis, sputum production and wheezing.   Cardiovascular: Positive for chest pain. Negative for palpitations, claudication, syncope and PND.  Gastrointestinal: Negative for abdominal pain and vomiting.  Genitourinary: Negative for dysuria and hematuria.  Musculoskeletal: Negative for arthralgias, back pain and neck pain.  Skin: Negative for color change and rash.  Neurological: Negative for seizures, syncope and headaches.  All other systems reviewed and are negative.   Physical Exam Updated Vital Signs BP 134/73 (BP Location: Left Arm)   Pulse 64   Temp 98.3 F (36.8 C) (Oral)   Resp 16   Ht 5\' 1"  (1.549 m)   Wt 104.3 kg   SpO2 100%   BMI 43.45  kg/m   Physical Exam Vitals and nursing note reviewed.  Constitutional:      General: She is not in acute distress.    Appearance: She is well-developed. She is ill-appearing. She is not toxic-appearing.  HENT:     Head: Normocephalic and atraumatic.  Eyes:     Conjunctiva/sclera: Conjunctivae normal.  Neck:     Vascular: JVD present.  Cardiovascular:     Rate and Rhythm: Normal rate and regular rhythm.     Pulses:          Radial pulses are 2+ on the right side and 2+ on the left side.       Dorsalis pedis pulses are 2+ on the right side and 2+ on the left side.     Heart sounds: No murmur heard.   Pulmonary:     Effort: Pulmonary effort is normal. Tachypnea present. No respiratory distress.     Breath sounds: Examination of the right-middle field reveals wheezing. Examination of the left-middle field reveals wheezing. Examination of the right-lower field reveals rales. Examination of the left-lower field reveals rales. Wheezing and rales present.  Abdominal:     Palpations: Abdomen is soft.     Tenderness: There is no abdominal tenderness.  Musculoskeletal:     Cervical back: Neck supple.     Right lower leg: 1+ Pitting Edema present.     Left lower leg: 1+ Pitting Edema present.  Skin:    General: Skin is warm and dry.  Neurological:     Mental Status: She is alert.  Psychiatric:        Behavior: Behavior is cooperative.     ED Results / Procedures / Treatments   Labs (all labs ordered are listed, but only abnormal results are displayed) Labs Reviewed  BASIC METABOLIC PANEL - Abnormal; Notable for the following components:      Result Value   Potassium 3.3 (*)    Glucose, Bld 162 (*)    All other components within normal limits  BRAIN NATRIURETIC PEPTIDE - Abnormal; Notable for the following components:   B Natriuretic Peptide 489.8 (*)    All other components within normal limits  BASIC METABOLIC PANEL - Abnormal; Notable for the following components:    Potassium 3.4 (*)  Glucose, Bld 190 (*)    All other components within normal limits  LIPID PANEL - Abnormal; Notable for the following components:   Cholesterol 233 (*)    Triglycerides 162 (*)    LDL Cholesterol 141 (*)    All other components within normal limits  GLUCOSE, CAPILLARY - Abnormal; Notable for the following components:   Glucose-Capillary 171 (*)    All other components within normal limits  GLUCOSE, CAPILLARY - Abnormal; Notable for the following components:   Glucose-Capillary 129 (*)    All other components within normal limits  CBG MONITORING, ED - Abnormal; Notable for the following components:   Glucose-Capillary 168 (*)    All other components within normal limits  RESP PANEL BY RT-PCR (FLU A&B, COVID) ARPGX2  MRSA PCR SCREENING  CBC  MAGNESIUM  HIV ANTIBODY (ROUTINE TESTING W REFLEX)  CBC  CREATININE, SERUM  TROPONIN I (HIGH SENSITIVITY)  TROPONIN I (HIGH SENSITIVITY)    EKG EKG Interpretation  Date/Time:  Saturday October 27 2020 16:58:06 EDT Ventricular Rate:  76 PR Interval:  176 QRS Duration: 96 QT Interval:  390 QTC Calculation: 438 R Axis:   -49 Text Interpretation: Normal sinus rhythm Left axis deviation Minimal voltage criteria for LVH, may be normal variant ( Cornell product ) Nonspecific ST abnormality  overall similar to 2020 Confirmed by Sherwood Gambler [phone]) on 10/27/2020 6:36:44 PM   Radiology DG Chest Port 1 View  Result Date: 10/28/2020 CLINICAL DATA:  Heart failure EXAM: PORTABLE CHEST 1 VIEW COMPARISON:  12/23/2018 FINDINGS: The heart size and mediastinal contours are within normal limits. Both lungs are clear. The visualized skeletal structures are unremarkable. IMPRESSION: No active disease. Electronically Signed   By: Ulyses Jarred M.D.   On: 10/28/2020 01:19   ECHOCARDIOGRAM COMPLETE  Result Date: 10/28/2020    ECHOCARDIOGRAM REPORT   Patient Name:   Beth Macias Date of Exam: 10/28/2020 Medical Rec #:  [id number]           Height:       61.0 in Accession #:    [id number]         Weight:       229.9 lb Date of Birth:  [birth year]          BSA:          2.004 m Patient Age:    22 years           BP:           145/77 mmHg Patient Gender: F                  HR:           59 bpm. Exam Location:  Inpatient Procedure: 2D Echo, Color Doppler and Cardiac Doppler Indications:    R94.31 Abnormal EKG  History:        Patient has prior history of Echocardiogram examinations, most                 recent 08/22/2020. CHF; Risk Factors:Hypertension, Diabetes and                 Dyslipidemia.  Sonographer:    Raquel Sarna Senior RDCS Referring Phys: [place]  1. Left ventricular ejection fraction, by estimation, is 55 to 60%. The left ventricle has normal function. The left ventricle has no regional wall motion abnormalities. There is mild left ventricular hypertrophy. Left ventricular diastolic parameters are indeterminate.  2. Right ventricular systolic function  is normal. The right ventricular size is normal. There is normal pulmonary artery systolic pressure. The estimated right ventricular systolic pressure is 85.4 mmHg.  3. Moderate pericardial effusion. The pericardial effusion is circumferential. No clear signs of tamponade.  4. The mitral valve is grossly normal. Trivial mitral valve regurgitation.  5. The aortic valve is tricuspid. There is mild calcification of the aortic valve. Aortic valve regurgitation is not visualized. Mild to moderate aortic valve sclerosis/calcification is present, without any evidence of aortic stenosis. FINDINGS  Left Ventricle: Left ventricular ejection fraction, by estimation, is 55 to 60%. The left ventricle has normal function. The left ventricle has no regional wall motion abnormalities. The left ventricular internal cavity size was normal in size. There is  mild left ventricular hypertrophy. Left ventricular diastolic parameters are indeterminate. Right Ventricle: The right ventricular size is  normal. No increase in right ventricular wall thickness. Right ventricular systolic function is normal. There is normal pulmonary artery systolic pressure. The tricuspid regurgitant velocity is 1.41 m/s, and  with an assumed right atrial pressure of 3 mmHg, the estimated right ventricular systolic pressure is 62.7 mmHg. Left Atrium: Left atrial size was normal in size. Right Atrium: Right atrial size was normal in size. Pericardium: A moderately sized pericardial effusion is present. The pericardial effusion is circumferential. Mitral Valve: The mitral valve is grossly normal. Trivial mitral valve regurgitation. Tricuspid Valve: The tricuspid valve is grossly normal. Tricuspid valve regurgitation is trivial. Aortic Valve: The aortic valve is tricuspid. There is mild calcification of the aortic valve. There is mild aortic valve annular calcification. Aortic valve regurgitation is not visualized. Mild to moderate aortic valve sclerosis/calcification is present, without any evidence of aortic stenosis. Pulmonic Valve: The pulmonic valve was grossly normal. Pulmonic valve regurgitation is trivial. Aorta: The aortic root is normal in size and structure. IAS/Shunts: No atrial level shunt detected by color flow Doppler.  LEFT VENTRICLE PLAX 2D LVIDd:         4.80 cm  Diastology LVIDs:         3.30 cm  LV e' medial:    3.26 cm/s LV PW:         1.20 cm  LV E/e' medial:  17.9 LV IVS:        1.20 cm  LV e' lateral:   4.24 cm/s LVOT diam:     1.90 cm  LV E/e' lateral: 13.7 LV SV:         56 LV SV Index:   28 LVOT Area:     2.84 cm  RIGHT VENTRICLE RV S prime:     15.50 cm/s TAPSE (M-mode): 2.2 cm LEFT ATRIUM             Index       RIGHT ATRIUM           Index LA diam:        4.70 cm 2.35 cm/m  RA Area:     13.30 cm LA Vol (A2C):   88.3 ml 44.06 ml/m RA Volume:   29.80 ml  14.87 ml/m LA Vol (A4C):   46.3 ml 23.10 ml/m LA Biplane Vol: 64.2 ml 32.03 ml/m  AORTIC VALVE LVOT Vmax:   82.90 cm/s LVOT Vmean:  59.200 cm/s LVOT  VTI:    0.198 m  AORTA Ao Root diam: 2.70 cm Ao Asc diam:  3.25 cm MITRAL VALVE               TRICUSPID VALVE MV Area (PHT):  2.54 cm    TR Peak grad:   8.0 mmHg MV Decel Time: 299 msec    TR Vmax:        141.00 cm/s MV E velocity: 58.30 cm/s MV A velocity: 84.00 cm/s  SHUNTS MV E/A ratio:  0.69        Systemic VTI:  0.20 m                            Systemic Diam: 1.90 cm Rozann Lesches MD Electronically signed by Rozann Lesches MD Signature Date/Time: 10/28/2020/3:41:48 PM    Final     Procedures Procedures   Medications Ordered in ED Medications  nitroGLYCERIN 50 mg in dextrose 5 % 250 mL (0.2 mg/mL) infusion (0 mcg/min Intravenous Stopped 10/28/20 1127)  carvedilol (COREG) tablet 25 mg (25 mg Oral Given 10/28/20 1034)  sacubitril-valsartan (ENTRESTO) 49-51 mg per tablet (1 tablet Oral Given 10/28/20 1034)  famotidine (PEPCID) tablet 40 mg (40 mg Oral Not Given 10/27/20 2213)  magnesium oxide (MAG-OX) tablet 400 mg (400 mg Oral Given 10/28/20 1034)  albuterol (VENTOLIN HFA) 108 (90 Base) MCG/ACT inhaler 2 puff (has no administration in time range)  fluticasone furoate-vilanterol (BREO ELLIPTA) 200-25 MCG/INH 1 puff (1 puff Inhalation Given 10/28/20 1518)  montelukast (SINGULAIR) tablet 10 mg (10 mg Oral Given 10/28/20 1034)  sodium chloride flush (NS) 0.9 % injection 3 mL (3 mLs Intravenous Not Given 10/28/20 1126)  sodium chloride flush (NS) 0.9 % injection 3 mL (has no administration in time range)  0.9 %  sodium chloride infusion (has no administration in time range)  acetaminophen (TYLENOL) tablet 650 mg (650 mg Oral Given 10/28/20 1038)  enoxaparin (LOVENOX) injection 40 mg (40 mg Subcutaneous Not Given 10/27/20 2210)  furosemide (LASIX) injection 60 mg (60 mg Intravenous Given 10/28/20 1034)  aspirin EC tablet 81 mg (81 mg Oral Given 10/28/20 1034)  insulin aspart (novoLOG) injection 0-15 Units (0 Units Subcutaneous Not Given 10/28/20 1125)  insulin aspart (novoLOG) injection 0-5 Units (0 Units Subcutaneous  Not Given 10/27/20 2217)  potassium chloride (KLOR-CON) packet 40 mEq (40 mEq Oral Given 10/28/20 1033)  ipratropium-albuterol (DUONEB) 0.5-2.5 (3) MG/3ML nebulizer solution 3 mL (3 mLs Nebulization Given 10/28/20 1518)  furosemide (LASIX) injection 60 mg (60 mg Intravenous Given 10/27/20 2002)  potassium chloride 10 mEq in 100 mL IVPB (0 mEq Intravenous Stopped 10/27/20 2045)  potassium chloride (KLOR-CON) packet 40 mEq (40 mEq Oral Given 10/27/20 2013)    ED Course  I have reviewed the triage vital signs and the nursing notes.  Pertinent labs & imaging results that were available during my care of the patient were reviewed by me and considered in my medical decision making (see chart for details).  Clinical Course as of 10/28/20 1550  Sat Oct 27, 2020  2019 Ordered for nitroglycerin infusion, BiPAP. Likely developing flash pulmonary edema in the setting of hypertensive emergency and underlying HFpEF. [ZB]  2038 Delta troponins flat.  Still low index of suspicion for ACS. [ZB]  2112 BP improving on bipap and nitro gtt. WoB is also improving.  [ZB]  2112 Spoke with hospitalist for admission. [ZB]    Clinical Course User Index [ZB] Pearson Grippe, DO   MDM Rules/Calculators/A&P                          My impression is hypertensive crisis/emergency with pulmonary edema in the setting of known CHF,  noncompliance with diuretics and antihypertensives. In the emergency department she was very hypertensive with systolic blood pressures around 250.  She had bibasilar rales as well as wheezing however lower index of suspicion for COPD exacerbation. She is not hypoxic on room air but does have conversational dyspnea.  Chest x-ray from urgent care was done.  I am unable to see the images however the radiology read was cardiomegaly with bilateral interstitial pulmonary edema. Patient does not want repeat chest x-ray here.  Plan will be to venodilator with IV nitroglycerin, placed on BiPAP for preload  offloading and then diurese with IV Lasix.  See clinical course above for further medical decision making and ED course.  Admitting to hospitalist for further tx   Final Clinical Impression(s) / ED Diagnoses Final diagnoses:  (HFpEF) heart failure with preserved ejection fraction [facility], [place])    Rx / DC Orders ED Discharge Orders    None       Pearson Grippe, DO 10/28/20 1550    Sherwood Gambler, MD 10/30/20 [phone]

## 2020-10-28 ENCOUNTER — Inpatient Hospital Stay (HOSPITAL_COMMUNITY): Payer: Medicare Other

## 2020-10-28 DIAGNOSIS — E1169 Type 2 diabetes mellitus with other specified complication: Secondary | ICD-10-CM

## 2020-10-28 DIAGNOSIS — I160 Hypertensive urgency: Secondary | ICD-10-CM

## 2020-10-28 DIAGNOSIS — R9431 Abnormal electrocardiogram [ECG] [EKG]: Secondary | ICD-10-CM

## 2020-10-28 DIAGNOSIS — I5033 Acute on chronic diastolic (congestive) heart failure: Secondary | ICD-10-CM

## 2020-10-28 DIAGNOSIS — E669 Obesity, unspecified: Secondary | ICD-10-CM

## 2020-10-28 LAB — BASIC METABOLIC PANEL WITH GFR
Anion gap: 8 (ref 5–15)
BUN: 17 mg/dL (ref 8–23)
CO2: 30 mmol/L (ref 22–32)
Calcium: 9.1 mg/dL (ref 8.9–10.3)
Chloride: 100 mmol/L (ref 98–111)
Creatinine, Ser: 0.86 mg/dL (ref 0.44–1.00)
GFR, Estimated: >60 mL/min
Glucose, Bld: 190 mg/dL — ABNORMAL HIGH (ref 70–99)
Potassium: 3.4 mmol/L — ABNORMAL LOW (ref 3.5–5.1)
Sodium: 138 mmol/L (ref 135–145)

## 2020-10-28 LAB — ECHOCARDIOGRAM COMPLETE
Area-P 1/2: 2.54 cm2
Height: 61 in
S' Lateral: 3.3 cm
Weight: 3679.04 oz

## 2020-10-28 LAB — MRSA PCR SCREENING: MRSA by PCR: NEGATIVE

## 2020-10-28 LAB — GLUCOSE, CAPILLARY
Glucose-Capillary: 171 mg/dL — ABNORMAL HIGH (ref 70–99)
Glucose-Capillary: 129 mg/dL — ABNORMAL HIGH (ref 70–99)
Glucose-Capillary: 133 mg/dL — ABNORMAL HIGH (ref 70–99)
Glucose-Capillary: 168 mg/dL — ABNORMAL HIGH (ref 70–99)

## 2020-10-28 MED ORDER — IPRATROPIUM-ALBUTEROL 0.5-2.5 (3) MG/3ML IN SOLN
3.0000 mL | Freq: Four times a day (QID) | RESPIRATORY_TRACT | Status: DC
  Administered 2020-10-28 (×3): 3 mL via RESPIRATORY_TRACT
  Filled 2020-10-28 (×3): qty 3

## 2020-10-28 MED ORDER — IPRATROPIUM-ALBUTEROL 0.5-2.5 (3) MG/3ML IN SOLN: 3.0000 mL | RESPIRATORY_TRACT | Status: DC | PRN

## 2020-10-28 MED ORDER — POTASSIUM CHLORIDE 20 MEQ PO PACK
40.0000 meq | PACK | Freq: Every day | ORAL | Status: DC
  Administered 2020-10-28 – 2020-10-29 (×2): 40 meq via ORAL
  Filled 2020-10-28 (×2): qty 2

## 2020-10-28 NOTE — Plan of Care (Signed)

## 2020-10-28 NOTE — Progress Notes (Signed)
Patient resting comfortably on room air. No respiratory distress noted. BIPAP not indicated at this time. RT will monitor as needed.

## 2020-10-28 NOTE — Plan of Care (Signed)

## 2020-10-28 NOTE — Progress Notes (Signed)
PROGRESS NOTE  Beth Macias ENI:[id number] DOB: 24-Jan-1951 DOA: 10/27/2020 PCP: Billie Ruddy, MD   LOS: 1 day   Brief Narrative / Interim history: 70 year old female with history of chronic diastolic CHF, HTN, DM2, mild intermittent asthma, obesity who comes into the hospital for shortness of breath.  This is been going on for a few days, she initially attributed to exposure to pollen.  She has had persistent wheezing despite using home medications felt ongoing breathing difficulties and decided to come to the hospital.  She was found to have significantly elevated blood pressure above 614 systolic and was started on nitroglycerin infusion.  She was on BiPAP on admission.  She initially presented to an urgent care and a chest x-ray there showed pulmonary edema and was then directed to the ER  Subjective / 24h Interval events: She is feeling better this morning, still appreciates some wheezing but feels improved compared to yesterday.  No chest pain  Assessment & Plan: Principal Problem Acute hypoxic respiratory failure due to acute on chronic combined CHF with acute pulmonary edema-this is likely in the setting of hypertensive emergency, she initially required BiPAP and was given Lasix with improvement in her respiratory status this morning -Continue Lasix, she still has some wheezing -Most recent 2 echos showed pericardial effusion which was actually getting better and EF was improving as well.  We will repeat an echo today -Resume home carvedilol, Entresto, wean off nitroglycerin  Active Problems Hypertensive emergency-resume home carvedilol, Entresto, wean off nitroglycerin today.  Patient does admit to a degree of dietary indiscretion  Mild intermittent asthma-she has faint end expiratory wheezing this morning but possibly due to #1.  Continue Lasix, may need few days of steroids if she still has persistent wheezing despite diuresis  Obesity, class III-she would benefit from  weight loss  DM2-placed on sliding scale insulin, A1c 7.0  Hypokalemia-due to diuretics, replete  Scheduled Meds: . aspirin EC  81 mg Oral Daily  . carvedilol  25 mg Oral BID  . enoxaparin (LOVENOX) injection  40 mg Subcutaneous Q24H  . famotidine  40 mg Oral QPM  . fluticasone furoate-vilanterol  1 puff Inhalation Daily  . furosemide  60 mg Intravenous Q12H  . insulin aspart  0-15 Units Subcutaneous TID WC  . insulin aspart  0-5 Units Subcutaneous QHS  . ipratropium-albuterol  3 mL Nebulization Q6H  . magnesium oxide  400 mg Oral Daily  . montelukast  10 mg Oral Daily  . potassium chloride  40 mEq Oral Daily  . sacubitril-valsartan  1 tablet Oral BID  . sodium chloride flush  3 mL Intravenous Q12H   Continuous Infusions: . sodium chloride    . nitroGLYCERIN 30 mcg/min (10/28/20 0819)   PRN Meds:.sodium chloride, acetaminophen, albuterol, sodium chloride flush  Diet Orders (From admission, onward)    Start     Ordered   10/27/20 2159  Diet heart healthy/carb modified Room service appropriate? Yes; Fluid consistency: Thin  Diet effective now       Question Answer Comment  Diet-HS Snack? Nothing   Room service appropriate? Yes   Fluid consistency: Thin      10/27/20 2159          DVT prophylaxis: enoxaparin (LOVENOX) injection 40 mg Start: 10/27/20 2200     Code Status: Full Code  Family Communication: No family at bedside  Status is: Inpatient  Remains inpatient appropriate because:Inpatient level of care appropriate due to severity of illness   Dispo: The patient  is from: Home              Anticipated d/c is to: Home              Patient currently is not medically stable to d/c.   Difficult to place patient No  Level of care: Progressive  Consultants:  none  Procedures:  2D echo: pending  Microbiology  none  Antimicrobials: none    Objective: Vitals:   10/28/20 0300 10/28/20 0514 10/28/20 0844 10/28/20 0917  BP: (!) 143/83  (!) 146/74    Pulse: 81  66   Resp: (!) 21  20   Temp: 98 F (36.7 C)  98.2 F (36.8 C)   TempSrc: Oral  Oral   SpO2: 100%  100% 100%  Weight:  104.3 kg    Height:        Intake/Output Summary (Last 24 hours) at 10/28/2020 0933 Last data filed at 10/28/2020 0400 Gross per 24 hour  Intake 208.19 ml  Output --  Net 208.19 ml   Filed Weights   10/28/20 0014 10/28/20 0015 10/28/20 0514  Weight: 104.8 kg 104 kg 104.3 kg    Examination:  Constitutional: NAD Eyes: no scleral icterus ENMT: Mucous membranes are moist.  Neck: normal, supple Respiratory: Faint end expiratory wheezing bilateral lung fields, no crackles Cardiovascular: Regular rate and rhythm, no murmurs / rubs / gallops. No LE edema.  Abdomen: non distended, no tenderness. Bowel sounds positive.  Musculoskeletal: no clubbing / cyanosis.  Skin: no rashes Neurologic: Nonfocal  Data Reviewed: I have independently reviewed following labs and imaging studies   CBC: Recent Labs  Lab 10/27/20 1708 10/27/20 2159  WBC 7.3 7.3  HGB 13.9 13.5  HCT 43.1 42.6  MCV 87.6 88.6  PLT 263 431   Basic Metabolic Panel: Recent Labs  Lab 10/27/20 1708 10/27/20 1933 10/27/20 2159 10/28/20 0040  NA 141  --   --  138  K 3.3*  --   --  3.4*  CL 104  --   --  100  CO2 29  --   --  30  GLUCOSE 162*  --   --  190*  BUN 14  --   --  17  CREATININE 0.75  --  0.75 0.86  CALCIUM 9.6  --   --  9.1  MG  --  1.7  --   --    Liver Function Tests: No results for input(s): AST, ALT, ALKPHOS, BILITOT, PROT, ALBUMIN in the last 168 hours. Coagulation Profile: No results for input(s): INR, PROTIME in the last 168 hours. HbA1C: No results for input(s): HGBA1C in the last 72 hours. CBG: Recent Labs  Lab 10/27/20 2216 10/28/20 0620  GLUCAP 168* 171*    Recent Results (from the past 240 hour(s))  Resp Panel by RT-PCR (Flu A&B, Covid) Nasopharyngeal Swab     Status: None   Collection Time: 10/27/20  9:00 PM   Specimen: Nasopharyngeal Swab;  Nasopharyngeal(NP) swabs in vial transport medium  Result Value Ref Range Status   SARS Coronavirus 2 by RT PCR NEGATIVE NEGATIVE Final    Comment: (NOTE) SARS-CoV-2 target nucleic acids are NOT DETECTED.  The SARS-CoV-2 RNA is generally detectable in upper respiratory specimens during the acute phase of infection. The lowest concentration of SARS-CoV-2 viral copies this assay can detect is 138 copies/mL. A negative result does not preclude SARS-Cov-2 infection and should not be used as the sole basis for treatment or other patient management decisions. A  negative result may occur with  improper specimen collection/handling, submission of specimen other than nasopharyngeal swab, presence of viral mutation(s) within the areas targeted by this assay, and inadequate number of viral copies(<138 copies/mL). A negative result must be combined with clinical observations, patient history, and epidemiological information. The expected result is Negative.  Fact Sheet for Patients:  EntrepreneurPulse.com.au  Fact Sheet for Healthcare Providers:  IncredibleEmployment.be  This test is no t yet approved or cleared by the [place] FDA and  has been authorized for detection and/or diagnosis of SARS-CoV-2 by FDA under an Emergency Use Authorization (EUA). This EUA will remain  in effect (meaning this test can be used) for the duration of the COVID-19 declaration under Section 564(b)(1) of the Act, 21 U.S.C.section 360bbb-3(b)(1), unless the authorization is terminated  or revoked sooner.       Influenza A by PCR NEGATIVE NEGATIVE Final   Influenza B by PCR NEGATIVE NEGATIVE Final    Comment: (NOTE) The Xpert Xpress SARS-CoV-2/FLU/RSV plus assay is intended as an aid in the diagnosis of influenza from Nasopharyngeal swab specimens and should not be used as a sole basis for treatment. Nasal washings and aspirates are unacceptable for Xpert Xpress  SARS-CoV-2/FLU/RSV testing.  Fact Sheet for Patients: EntrepreneurPulse.com.au  Fact Sheet for Healthcare Providers: IncredibleEmployment.be  This test is not yet approved or cleared by the [place] FDA and has been authorized for detection and/or diagnosis of SARS-CoV-2 by FDA under an Emergency Use Authorization (EUA). This EUA will remain in effect (meaning this test can be used) for the duration of the COVID-19 declaration under Section 564(b)(1) of the Act, 21 U.S.C. section 360bbb-3(b)(1), unless the authorization is terminated or revoked.  Performed at [facility], [place] [address], [place], [place] [address]   MRSA PCR Screening     Status: None   Collection Time: 10/28/20 12:16 AM   Specimen: Nasopharyngeal  Result Value Ref Range Status   MRSA by PCR NEGATIVE NEGATIVE Final    Comment:        The GeneXpert MRSA Assay (FDA approved for NASAL specimens only), is one component of a comprehensive MRSA colonization surveillance program. It is not intended to diagnose MRSA infection nor to guide or monitor treatment for MRSA infections. Performed at [facility], [place] [address] [place], [place] [address]      Radiology Studies: DG Chest Port 1 View  Result Date: 10/28/2020 CLINICAL DATA:  Heart failure EXAM: PORTABLE CHEST 1 VIEW COMPARISON:  12/23/2018 FINDINGS: The heart size and mediastinal contours are within normal limits. Both lungs are clear. The visualized skeletal structures are unremarkable. IMPRESSION: No active disease. Electronically Signed   By: Ulyses Jarred M.D.   On: 10/28/2020 01:19    Marzetta Board, MD, PhD [facility]  Between 7 am - 7 pm I am available, please contact me via Amion (for emergencies) or Securechat (non urgent messages)  Between 7 pm - 7 am I am not available, please contact night coverage MD/APP via Amion

## 2020-10-28 NOTE — Progress Notes (Signed)
Echocardiogram 2D Echocardiogram has been performed.  Oneal Deputy Everlena Mackley 10/28/2020, 2:37 PM

## 2020-10-29 ENCOUNTER — Other Ambulatory Visit (HOSPITAL_COMMUNITY): Payer: Self-pay

## 2020-10-29 LAB — GLUCOSE, CAPILLARY
Glucose-Capillary: 125 mg/dL — ABNORMAL HIGH (ref 70–99)
Glucose-Capillary: 130 mg/dL — ABNORMAL HIGH (ref 70–99)

## 2020-10-29 LAB — BASIC METABOLIC PANEL
Anion gap: 10 (ref 5–15)
BUN: 22 mg/dL (ref 8–23)
CO2: 29 mmol/L (ref 22–32)
Calcium: 9.5 mg/dL (ref 8.9–10.3)
Chloride: 99 mmol/L (ref 98–111)
Creatinine, Ser: 0.9 mg/dL (ref 0.44–1.00)
GFR, Estimated: >60 mL/min (ref >60)
Glucose, Bld: 206 mg/dL — ABNORMAL HIGH (ref 70–99)
Potassium: 3.3 mmol/L — ABNORMAL LOW (ref 3.5–5.1)
Sodium: 138 mmol/L (ref 135–145)

## 2020-10-29 LAB — CBC
HCT: 39.7 % (ref 36.0–46.0)
Hemoglobin: 12.7 g/dL (ref 12.0–15.0)
MCH: 28 pg (ref 26.0–34.0)
MCHC: 32 g/dL (ref 30.0–36.0)
MCV: 87.6 fL (ref 80.0–100.0)
Platelets: 231 10*3/uL (ref 150–400)
RBC: 4.53 MIL/uL (ref 3.87–5.11)
RDW: 13.5 % (ref 11.5–15.5)
WBC: 6.8 10*3/uL (ref 4.0–10.5)
nRBC: 0 % (ref 0.0–0.2)

## 2020-10-29 MED ORDER — POTASSIUM CHLORIDE 20 MEQ/15ML (10%) PO SOLN: 20.0000 meq | Freq: Every day | ORAL | Status: DC | PRN

## 2020-10-29 MED ORDER — HYDRALAZINE HCL 25 MG PO TABS: 25.0000 mg | ORAL_TABLET | Freq: Three times a day (TID) | ORAL | Status: DC

## 2020-10-29 MED ORDER — TORSEMIDE 20 MG PO TABS: 20.0000 mg | ORAL_TABLET | Freq: Every day | ORAL | Status: DC | PRN

## 2020-10-29 MED ORDER — ALLOPURINOL 100 MG PO TABS: 100.0000 mg | ORAL_TABLET | Freq: Every day | ORAL | Status: DC | PRN

## 2020-10-29 MED ORDER — DAPAGLIFLOZIN PROPANEDIOL 10 MG PO TABS
10.0000 mg | ORAL_TABLET | Freq: Every day | ORAL | 0 refills | Status: DC
  Filled 2020-10-29: qty 30, 30d supply, fill #0

## 2020-10-29 NOTE — Discharge Instructions (Signed)
Dapagliflozin tablets What is this medicine? DAPAGLIFLOZIN (DAP a gli FLOE zin) controls blood sugar in people with diabetes. It is used with lifestyle changes like diet and exercise. It also treats heart failure and kidney disease. It may lower the risk for treatment of heart failure in the hospital or worsened kidney disease. This medicine may be used for other purposes; ask your health care provider or pharmacist if you have questions. COMMON BRAND NAME(S): Wilder Glade What should I tell my health care provider before I take this medicine? They need to know if you have any of these conditions:  dehydration  diabetic ketoacidosis  diet low in salt  eating less due to illness, surgery, dieting, or any other reason  having surgery  history of pancreatitis or pancreas problems  history of yeast infection of the penis or vagina  if you often drink alcohol  infection in the bladder, kidneys, or urinary tract  kidney disease  low blood pressure  on dialysis  problems urinating  type 1 diabetes  uncircumcised female  an unusual or allergic reaction to dapagliflozin, other medicines, foods, dyes, or preservatives  pregnant or trying to get pregnant  breast-feeding How should I use this medicine? Take this medicine by mouth with water. Take it as directed on the prescription label at the same time every day. You can take it with or without food. If it upsets your stomach, take it with food. Keep taking it unless your health care provider tells you to stop. A special MedGuide will be given to you by the pharmacist with each prescription and refill. Be sure to read this information carefully each time. Talk to your health care provider about the use of this medicine in children. Special care may be needed. Overdosage: If you think you have taken too much of this medicine contact a poison control center or emergency room at once. NOTE: This medicine is only for you. Do not share this  medicine with others. What if I miss a dose? If you miss a dose, take it as soon as you can. If it is almost time for your next dose, take only that dose. Do not take double or extra doses. What may interact with this medicine? Interactions are not expected. This list may not describe all possible interactions. Give your health care provider a list of all the medicines, herbs, non-prescription drugs, or dietary supplements you use. Also tell them if you smoke, drink alcohol, or use illegal drugs. Some items may interact with your medicine. What should I watch for while using this medicine? Visit your health care provider for regular checks on your progress. Tell your health care provider if your symptoms do not start to get better or if they get worse. This medicine can cause a serious condition in which there is too much acid in the blood. If you develop nausea, vomiting, stomach pain, unusual tiredness, or breathing problems, stop taking this medicine and call your doctor right away. If possible, use a ketone dipstick to check for ketones in your urine. Check with your health care provider if you have severe diarrhea, nausea, and vomiting, or if you sweat a lot. The loss of too much body fluid may make it dangerous for you to take this medicine. A test called the HbA1C (A1C) will be monitored. This is a simple blood test. It measures your blood sugar control over the last 2 to 3 months. You will receive this test every 3 to 6 months. Learn how  to check your blood sugar. Learn the symptoms of low and high blood sugar and how to manage them. Always carry a quick-source of sugar with you in case you have symptoms of low blood sugar. Examples include hard sugar candy or glucose tablets. Make sure others know that you can choke if you eat or drink when you develop serious symptoms of low blood sugar, such as seizures or unconsciousness. Get medical help at once. Tell your health care provider if you have  high blood sugar. You might need to change the dose of your medicine. If you are sick or exercising more than usual, you may need to change the dose of your medicine. Do not skip meals. Ask your health care provider if you should avoid alcohol. Many nonprescription cough and cold products contain sugar or alcohol. These can affect blood sugar. Wear a medical ID bracelet or chain. Carry a card that describes your condition. List the medicines and doses you take on the card. What side effects may I notice from receiving this medicine? Side effects that you should report to your doctor or health care professional as soon as possible:  allergic reactions (skin rash, itching or hives, swelling of the face, lips, or tongue)  breathing problems  dizziness  feeling faint or lightheaded, falls  genital infection (fever; tenderness, redness, or swelling in the genitals or area from the genitals to the back of the rectum)  kidney injury (trouble passing urine or change in the amount of urine)  low blood sugar (feeling anxious; confusion; dizziness; increased hunger; unusually weak or tired; increased sweating; shakiness; cold, clammy skin; irritable; headache; blurred vision; fast heartbeat; loss of consciousness)  muscle weakness  nausea, vomiting, unusual stomach upset or pain  new pain or tenderness, change in skin color, sores or ulcers, or infection in legs or feet  penile discharge, itching, or pain  unusual tiredness  unusual vaginal discharge, itching, or odor  urinary tract infection (fever; chills; a burning feeling when urinating; urgent need to urinate more often; blood in the urine; back pain) Side effects that usually do not require medical attention (report to your doctor or health care professional if they continue or are bothersome):  mild increase in urination  thirsty This list may not describe all possible side effects. Call your doctor for medical advice about side  effects. You may report side effects to FDA at 1-800-FDA-1088. Where should I keep my medicine? Keep out of the reach of children and pets. Store at room temperature between 20 and 25 degrees C (68 and 77 degrees F). Get rid of any unused medicine after the expiration date. To get rid of medicines that are no longer needed or have expired:  Take the medicine to a medicine take-back program. Check with your pharmacy or law enforcement to find a location.  If you cannot return the medicine, check the label or package insert to see if the medicine should be thrown out in the garbage or flushed down the toilet. If you are not sure, ask your health care provider. If it is safe to put it in the trash, take the medicine out of the container. Mix the medicine with cat litter, dirt, coffee grounds, or other unwanted substance. Seal the mixture in a bag or container. Put it in the trash. NOTE: This sheet is a summary. It may not cover all possible information. If you have questions about this medicine, talk to your doctor, pharmacist, or health care provider.  2021  Elsevier/Gold Standard (2019-10-19 13:18:47) Heart Failure, Diagnosis  Heart failure means that your heart is not able to pump blood in the right way. This makes it hard for your body to work well. Heart failure is usually a long-term (chronic) condition. You must take good care of yourself and follow your treatment plan from your doctor. What are the causes?  High blood pressure.  Buildup of cholesterol and fat in the arteries.  Heart attack. This injures the heart muscle.  Heart valves that do not open and close properly.  Damage of the heart muscle. This is also called cardiomyopathy.  Infection of the heart muscle. This is also called myocarditis.  Lung disease. What increases the risk?  Getting older. The risk of heart failure goes up as a person ages.  Being overweight.  Being female.  Use tobacco or nicotine  products.  Abusing alcohol or drugs.  Having taken medicines that can damage the heart.  Having any of these conditions: ? Diabetes. ? Abnormal heart rhythms. ? Thyroid problems. ? Low blood counts (anemia).  Having a family history of heart failure. What are the signs or symptoms?  Shortness of breath.  Coughing.  Swelling of the feet, ankles, legs, or belly.  Losing or gaining weight for no reason.  Trouble breathing.  Waking from sleep because of the need to sit up and get more air.  Fast heartbeat.  Being very tired.  Feeling dizzy, or feeling like you may pass out (faint).  Having no desire to eat.  Feeling like you may vomit (nauseous).  Peeing (urinating) more at night.  Feeling confused. How is this treated? This condition may be treated with:  Medicines. These can be given to treat blood pressure and to make the heart muscles stronger.  Changes in your daily life. These may include: ? Eating a healthy diet. ? Staying at a healthy body weight. ? Quitting tobacco, alcohol, and drug use. ? Doing exercises. ? Participating in a cardiac rehabilitation program. This program helps you improve your health through exercise, education, and counseling.  Surgery. Surgery can be done to open blocked valves, or to put devices in the heart, such as pacemakers.  A donor heart (heart transplant). You will receive a healthy heart from a donor. Follow these instructions at home:  Treat other conditions as told by your doctor. These may include high blood pressure, diabetes, thyroid disease, or abnormal heart rhythms.  Learn as much as you can about heart failure.  Get support as you need it.  Keep all follow-up visits. Summary  Heart failure means that your heart is not able to pump blood in the right way.  This condition is often caused by high blood pressure, heart attack, or damage of the heart muscle.  Symptoms of this condition include shortness of  breath and swelling of the feet, ankles, legs, or belly. You may also feel very tired or feel like you may vomit.  You may be treated with medicines, surgery, or changes in your daily life.  Treat other health conditions as told by your doctor. This information is not intended to replace advice given to you by your health care provider. Make sure you discuss any questions you have with your health care provider. Document Revised: 12/03/2019 Document Reviewed: 12/03/2019 Elsevier Patient Education  2021 [place] With Heart Failure Heart failure is a long-term (chronic) condition in which the heart cannot pump enough blood through the body. When this happens, parts of  the body do not get the blood and oxygen they need. There is no cure for heart failure at this time, so it is important for you to take good care of yourself and follow the treatment plan you set with your health care provider. If you are living with heart failure, there are ways to help you manage the disease. How to manage lifestyle changes Living with heart failure requires you to make changes in your life. Your health care team will teach you about the changes you need to make in order to relieve your symptoms and lower your risk of going to the hospital. Work with your health care provider to develop a treatment plan that works for you. Activity  Ask your health care provider about attending cardiac rehabilitation. These programs include aerobic physical activity, which provides many benefits for your heart.  If no cardiac rehabilitation program is available, ask your health care provider what aerobic exercises are safe for you to do.  Return to your normal activities as told by your health care provider. Ask your health care provider what activities are safe for you.  Pace your daily activities and allow time for rest as needed. Managing stress It is normal to have many emotions about your diagnosis, such as  fear, sadness, anger, and loss. If you feel any of these emotions and need help coping, contact your health care provider. Here are some ways to help yourself manage these emotions:  Talk to friends and family members about your condition. They can give you support and guidance. Explain your symptoms to them and, if comfortable, invite them to attend appointments or rehabilitation with you.  Join a support group for people with chronic heart failure. Talking with other people who have the same symptoms may give you new ways of coping with your disease and your emotions.  Accept help from others. Do not be ashamed if you need help with certain tasks.  Use stress management techniques, such as meditation, breathing exercises, or listening to relaxing music. Conditions such as depression and anxiety are common in persons with heart failure. Pay attention to changes in your mood, emotions, and stress levels. Tell your health care provider if you have any of the following symptoms:  Trouble sleeping or a change in your sleeping patterns.  Feeling sad, down, or depressed more often than not, every day for more than 2 weeks.  Losing interest in activities you normally enjoy.  Feeling irritable or crying for no reason.  Finding yourself worrying about the future often. Work You may need to develop a plan with your health care provider if heart failure interferes with your ability to work. This may include:  Reducing your work hours.  Finding functions that are less active or require less effort.  Planning rest periods during your work hours. Travel  Talk with your health care provider if you plan to travel. There may be circumstances in which your health care provider recommends that you do not travel or that you delay travel until your condition is under control.  When you travel, bring your medicine and a list of your medicines. If you are traveling by air, keep your medicines with you in a  carry-on bag.  Consider finding a medical facility in the area you will be traveling to and determine what your health insurance will cover.  If you will be traveling by public transportation (airplane, train, bus), contact the company prior to traveling if you have special needs. This  may include needs related to diet, oxygen, a wheelchair, a seating request, or help with luggage.  If you use oxygen, make sure to bring enough oxygen with you.  If you have a battery-powered device, bring a fully charged extra battery with you.  If you have a device, bring a note from your health care provider and inform all security screening personnel that you have the device. You may need to go through special screening for safety. Sexual activity  Ask your health care provider when it is safe for you to resume sexual activity.  You may need to start slowly and gradually increase intimacy. You can increase intimacy by doing such things as caressing, touching, and holding each other.  Get regular exercise as told by your health care provider. This can benefit your sex life by building strength and endurance.   Sleep If your condition interferes with your sleep, find ways to improve your sleep quality, such as:  Sleep lying on your side, or sleep with your head elevated by raising the head of your bed or using multiple pillows.  Ask your health care provider about screening for sleep apnea.  Try to go to sleep and wake up at the same times every day.  Sleep in a dark, cool room.  Do not do any physical activity or eating for a few hours before bedtime.  Plan rest periods during the day, but do not take long naps during the day.   Where to find support  Consider talking with: ? Family members. ? Close friends. ? A mental health professional or therapist. ? A member of your church, faith, or community group.  Other sources of support include: ? Local support groups. Ask your health care  provider about groups near you. ? Online support groups, such as those found through the American Heart Association: supportnetwork.heart.org ? Local home care agencies, community agencies, or social agencies. ? A palliative care specialist. Palliative care can help you manage symptoms, promote comfort, improve quality of life, and maintain dignity. Where to find more information  American Heart Association: heart.org  National Heart, Lung, and Blood Institute: https://www.hartman-hill.biz/  Centers for Disease Control and Prevention: https://www.reeves.com/  [place]: SolutionApps.it Contact a health care provider if:  You have a rapid weight gain.  You have increasing shortness of breath that is unusual for you.  You are unable to participate in your usual physical activities.  You tire easily.  You have difficulty sleeping, such as: ? You wake up feeling short of breath. ? You have to use more pillows to raise your head in order to sleep.  You cough more than normal, especially with physical activity.  You have any swelling or more swelling in areas such as your hands, feet, ankles, or abdomen.  You become dizzy or light-headed when you stand up.  You have changes to your appetite.  You have symptoms of depression or anxiety. Get help right away if:  You have difficulty breathing.  You notice or your family notices a change in your awareness, such as having trouble staying awake or having difficulty with concentration.  You have pain or discomfort in your chest.  You have an episode of fainting (syncope).  You feel like your heart is beating quickly (palpitations).  You have extreme feelings of sadness or loss of hope, or you have thoughts about hurting yourself or others. These symptoms may represent a serious problem that is an emergency. Do not wait to  see if the symptoms will go away. Get medical help right away. Call your local emergency services (911 in  the [place] Do not drive yourself to the hospital. Summary  There is no cure for heart failure, so it is important for you to take good care of yourself and follow the treatment plan set by your health care provider.  Ask your health care provider about attending cardiac rehabilitation. These programs include aerobic physical activity, which provides many benefits for your heart.  It is normal to have many emotions about your diagnosis, such as fear, sadness, anger, and loss. If you feel any of these emotions and need help coping, contact your health care provider.  You may need to develop a plan with your health care provider if heart failure interferes with your ability to work. This information is not intended to replace advice given to you by your health care provider. Make sure you discuss any questions you have with your health care provider. Document Revised: 12/26/2019 Document Reviewed: 12/26/2019 Elsevier Patient Education  2021 [employer].

## 2020-10-29 NOTE — Progress Notes (Signed)
Mobility Specialist: Progress Note   10/29/20 1707  Mobility  Activity  (Cancel)   Pt seen ambulating independently in hallway with RN. Pt is planning for discharge later this evening when pt's daughter gets off work.   [facility] Francely Craw Mobility Specialist Mobility Specialist Phone: [phone]

## 2020-10-29 NOTE — Discharge Summary (Signed)
Physician Discharge Summary  Beth Macias RJJ:[id number] DOB: 04-Sep-1950 DOA: 10/27/2020  PCP: Billie Ruddy, MD  Admit date: 10/27/2020 Discharge date: 10/29/2020  Admitted From: home Discharge disposition: home   Recommendations for Outpatient Follow-Up:   1. BMP 1 week 2. Repeat echo 3. Encouraged compliance with low salt diet and taking meds daily 4. Daily weights   Discharge Diagnosis:   Principal Problem:   Acute on chronic heart failure with preserved ejection fraction (HFpEF) (HCC) Active Problems:   Hypokalemia   Morbid obesity with BMI of 40.0-44.9, adult (HCC)   Mild intermittent asthma without complication   Hypertensive urgency   Diabetes mellitus type 2 in obese [facility])    Discharge Condition: Improved.  Diet recommendation: Low sodium, heart healthy.  Carbohydrate-modified  Wound care: None.  Code status: Full.   History of Present Illness:   Beth Macias is a 69 y.o. female with medical history significant for HFpEF, HTN, DMT2, mild intermittent asthma, morbid obesity who presents for evaluation of SOB. She reports that she started to have SOB 4 days ago. SOB exacerbated by activity and exertion and feels smothered when laying flat.  Reports intermittent sharp chest pain across her entire chest region and around her sides into her upper back.  She has not had any palpitations or chest pressure.  She denies any recent exposures to fumes, chemicals, smoke or animals.  She does say when the pollen is heavy it causes her congestion.  She denies any abdominal pain, nausea, vomiting, diarrhea, rash, syncope.  She has no history of PE or DVTs.  She has tried using her albuterol at home with only minimal relief.  Went to an urgent care this morning and was given a breathing treatment with albuterol for wheezing.  Chest x-ray there revealed cardiomegaly and bilateral interstitial edema.  COVID-19 test was negative.  She was then sent to the hospital  for further evaluation as her systolic blood pressure was greater than 210.  She denies tobacco, alcohol, illicit drug use.   Hospital Course by Problem:   Acute hypoxic respiratory failure due to acute on chronic combined CHF with acute pulmonary edema-this is likely in the setting of hypertensive emergency, she initially required BiPAP and was given Lasix with improvement in her respiratory status this morning -Most recent 2 echos showed pericardial effusion-- outpatient follow up   -Resume home carvedilol, Entresto -much improved and asking to go home this AM  Hypertensive emergency-resume home carvedilol, Entresto   Patient does admit to a degree of dietary indiscretion and not taking meds at home  Mild intermittent asthma- -resume home meds  Obesity, class III-she would benefit from weight loss  DM2-placed on sliding scale insulin, A1c 7.0 -started farxiga  Hypokalemia-due to diuretics, replete    Medical Consultants:      Discharge Exam:   Vitals:   10/29/20 1100 10/29/20 1103  BP: (!) 162/78 (!) 144/71  Pulse:    Resp:    Temp:    SpO2:     Vitals:   10/29/20 0705 10/29/20 0851 10/29/20 1100 10/29/20 1103  BP:  (!) 191/96 (!) 162/78 (!) 144/71  Pulse: 84 80    Resp: 16 19    Temp:      TempSrc:      SpO2: 96%     Weight:      Height:        General exam: Appears calm and comfortable  The results of significant diagnostics from  this hospitalization (including imaging, microbiology, ancillary and laboratory) are listed below for reference.     Procedures and Diagnostic Studies:   DG Chest Port 1 View  Result Date: 10/28/2020 CLINICAL DATA:  Heart failure EXAM: PORTABLE CHEST 1 VIEW COMPARISON:  12/23/2018 FINDINGS: The heart size and mediastinal contours are within normal limits. Both lungs are clear. The visualized skeletal structures are unremarkable. IMPRESSION: No active disease. Electronically Signed   By: Ulyses Jarred M.D.   On: 10/28/2020  01:19   ECHOCARDIOGRAM COMPLETE  Result Date: 10/28/2020    ECHOCARDIOGRAM REPORT   Patient Name:   Beth Macias Date of Exam: 10/28/2020 Medical Rec #:  [id number]          Height:       61.0 in Accession #:    [id number]         Weight:       229.9 lb Date of Birth:  March 31, 1951          BSA:          2.004 m Patient Age:    73 years           BP:           145/77 mmHg Patient Gender: F                  HR:           59 bpm. Exam Location:  Inpatient Procedure: 2D Echo, Color Doppler and Cardiac Doppler Indications:    R94.31 Abnormal EKG  History:        Patient has prior history of Echocardiogram examinations, most                 recent 08/22/2020. CHF; Risk Factors:Hypertension, Diabetes and                 Dyslipidemia.  Sonographer:    Raquel Sarna Senior RDCS Referring Phys: [place]  1. Left ventricular ejection fraction, by estimation, is 55 to 60%. The left ventricle has normal function. The left ventricle has no regional wall motion abnormalities. There is mild left ventricular hypertrophy. Left ventricular diastolic parameters are indeterminate.  2. Right ventricular systolic function is normal. The right ventricular size is normal. There is normal pulmonary artery systolic pressure. The estimated right ventricular systolic pressure is 81.8 mmHg.  3. Moderate pericardial effusion. The pericardial effusion is circumferential. No clear signs of tamponade.  4. The mitral valve is grossly normal. Trivial mitral valve regurgitation.  5. The aortic valve is tricuspid. There is mild calcification of the aortic valve. Aortic valve regurgitation is not visualized. Mild to moderate aortic valve sclerosis/calcification is present, without any evidence of aortic stenosis. FINDINGS  Left Ventricle: Left ventricular ejection fraction, by estimation, is 55 to 60%. The left ventricle has normal function. The left ventricle has no regional wall motion abnormalities. The left ventricular internal  cavity size was normal in size. There is  mild left ventricular hypertrophy. Left ventricular diastolic parameters are indeterminate. Right Ventricle: The right ventricular size is normal. No increase in right ventricular wall thickness. Right ventricular systolic function is normal. There is normal pulmonary artery systolic pressure. The tricuspid regurgitant velocity is 1.41 m/s, and  with an assumed right atrial pressure of 3 mmHg, the estimated right ventricular systolic pressure is 29.9 mmHg. Left Atrium: Left atrial size was normal in size. Right Atrium: Right atrial size was normal in size. Pericardium: A moderately sized pericardial effusion  is present. The pericardial effusion is circumferential. Mitral Valve: The mitral valve is grossly normal. Trivial mitral valve regurgitation. Tricuspid Valve: The tricuspid valve is grossly normal. Tricuspid valve regurgitation is trivial. Aortic Valve: The aortic valve is tricuspid. There is mild calcification of the aortic valve. There is mild aortic valve annular calcification. Aortic valve regurgitation is not visualized. Mild to moderate aortic valve sclerosis/calcification is present, without any evidence of aortic stenosis. Pulmonic Valve: The pulmonic valve was grossly normal. Pulmonic valve regurgitation is trivial. Aorta: The aortic root is normal in size and structure. IAS/Shunts: No atrial level shunt detected by color flow Doppler.  LEFT VENTRICLE PLAX 2D LVIDd:         4.80 cm  Diastology LVIDs:         3.30 cm  LV e' medial:    3.26 cm/s LV PW:         1.20 cm  LV E/e' medial:  17.9 LV IVS:        1.20 cm  LV e' lateral:   4.24 cm/s LVOT diam:     1.90 cm  LV E/e' lateral: 13.7 LV SV:         56 LV SV Index:   28 LVOT Area:     2.84 cm  RIGHT VENTRICLE RV S prime:     15.50 cm/s TAPSE (M-mode): 2.2 cm LEFT ATRIUM             Index       RIGHT ATRIUM           Index LA diam:        4.70 cm 2.35 cm/m  RA Area:     13.30 cm LA Vol (A2C):   88.3 ml 44.06  ml/m RA Volume:   29.80 ml  14.87 ml/m LA Vol (A4C):   46.3 ml 23.10 ml/m LA Biplane Vol: 64.2 ml 32.03 ml/m  AORTIC VALVE LVOT Vmax:   82.90 cm/s LVOT Vmean:  59.200 cm/s LVOT VTI:    0.198 m  AORTA Ao Root diam: 2.70 cm Ao Asc diam:  3.25 cm MITRAL VALVE               TRICUSPID VALVE MV Area (PHT): 2.54 cm    TR Peak grad:   8.0 mmHg MV Decel Time: 299 msec    TR Vmax:        141.00 cm/s MV E velocity: 58.30 cm/s MV A velocity: 84.00 cm/s  SHUNTS MV E/A ratio:  0.69        Systemic VTI:  0.20 m                            Systemic Diam: 1.90 cm Rozann Lesches MD Electronically signed by Rozann Lesches MD Signature Date/Time: 10/28/2020/3:41:48 PM    Final      Labs:   Basic Metabolic Panel: Recent Labs  Lab 10/27/20 1708 10/27/20 1933 10/27/20 2159 10/28/20 0040 10/29/20 0021  NA 141  --   --  138 138  K 3.3*  --   --  3.4* 3.3*  CL 104  --   --  100 99  CO2 29  --   --  30 29  GLUCOSE 162*  --   --  190* 206*  BUN 14  --   --  17 22  CREATININE 0.75  --  0.75 0.86 0.90  CALCIUM 9.6  --   --  9.1  9.5  MG  --  1.7  --   --   --    GFR Estimated Creatinine Clearance: 64.2 mL/min (by C-G formula based on SCr of 0.9 mg/dL). Liver Function Tests: No results for input(s): AST, ALT, ALKPHOS, BILITOT, PROT, ALBUMIN in the last 168 hours. No results for input(s): LIPASE, AMYLASE in the last 168 hours. No results for input(s): AMMONIA in the last 168 hours. Coagulation profile No results for input(s): INR, PROTIME in the last 168 hours.  CBC: Recent Labs  Lab 10/27/20 1708 10/27/20 2159 10/29/20 0021  WBC 7.3 7.3 6.8  HGB 13.9 13.5 12.7  HCT 43.1 42.6 39.7  MCV 87.6 88.6 87.6  PLT 263 268 231   Cardiac Enzymes: No results for input(s): CKTOTAL, CKMB, CKMBINDEX, TROPONINI in the last 168 hours. BNP: Invalid input(s): POCBNP CBG: Recent Labs  Lab 10/28/20 1103 10/28/20 1607 10/28/20 2101 10/29/20 0604 10/29/20 1137  GLUCAP 129* 133* 168* 125* 130*   D-Dimer No  results for input(s): DDIMER in the last 72 hours. Hgb A1c No results for input(s): HGBA1C in the last 72 hours. Lipid Profile Recent Labs    10/27/20 2159  CHOL 233*  HDL 60  LDLCALC 141*  TRIG 162*  CHOLHDL 3.9   Thyroid function studies No results for input(s): TSH, T4TOTAL, T3FREE, THYROIDAB in the last 72 hours.  Invalid input(s): FREET3 Anemia work up No results for input(s): VITAMINB12, FOLATE, FERRITIN, TIBC, IRON, RETICCTPCT in the last 72 hours. Microbiology Recent Results (from the past 240 hour(s))  Resp Panel by RT-PCR (Flu A&B, Covid) Nasopharyngeal Swab     Status: None   Collection Time: 10/27/20  9:00 PM   Specimen: Nasopharyngeal Swab; Nasopharyngeal(NP) swabs in vial transport medium  Result Value Ref Range Status   SARS Coronavirus 2 by RT PCR NEGATIVE NEGATIVE Final    Comment: (NOTE) SARS-CoV-2 target nucleic acids are NOT DETECTED.  The SARS-CoV-2 RNA is generally detectable in upper respiratory specimens during the acute phase of infection. The lowest concentration of SARS-CoV-2 viral copies this assay can detect is 138 copies/mL. A negative result does not preclude SARS-Cov-2 infection and should not be used as the sole basis for treatment or other patient management decisions. A negative result may occur with  improper specimen collection/handling, submission of specimen other than nasopharyngeal swab, presence of viral mutation(s) within the areas targeted by this assay, and inadequate number of viral copies(<138 copies/mL). A negative result must be combined with clinical observations, patient history, and epidemiological information. The expected result is Negative.  Fact Sheet for Patients:  EntrepreneurPulse.com.au  Fact Sheet for Healthcare Providers:  IncredibleEmployment.be  This test is no t yet approved or cleared by the [place] FDA and  has been authorized for detection and/or diagnosis of  SARS-CoV-2 by FDA under an Emergency Use Authorization (EUA). This EUA will remain  in effect (meaning this test can be used) for the duration of the COVID-19 declaration under Section 564(b)(1) of the Act, 21 U.S.C.section 360bbb-3(b)(1), unless the authorization is terminated  or revoked sooner.       Influenza A by PCR NEGATIVE NEGATIVE Final   Influenza B by PCR NEGATIVE NEGATIVE Final    Comment: (NOTE) The Xpert Xpress SARS-CoV-2/FLU/RSV plus assay is intended as an aid in the diagnosis of influenza from Nasopharyngeal swab specimens and should not be used as a sole basis for treatment. Nasal washings and aspirates are unacceptable for Xpert Xpress SARS-CoV-2/FLU/RSV testing.  Fact Sheet for Patients: EntrepreneurPulse.com.au  Fact Sheet for Healthcare Providers: IncredibleEmployment.be  This test is not yet approved or cleared by the [place] FDA and has been authorized for detection and/or diagnosis of SARS-CoV-2 by FDA under an Emergency Use Authorization (EUA). This EUA will remain in effect (meaning this test can be used) for the duration of the COVID-19 declaration under Section 564(b)(1) of the Act, 21 U.S.C. section 360bbb-3(b)(1), unless the authorization is terminated or revoked.  Performed at [facility], [place] [address] [place], [place] [address]   MRSA PCR Screening     Status: None   Collection Time: 10/28/20 12:16 AM   Specimen: Nasopharyngeal  Result Value Ref Range Status   MRSA by PCR NEGATIVE NEGATIVE Final    Comment:        The GeneXpert MRSA Assay (FDA approved for NASAL specimens only), is one component of a comprehensive MRSA colonization surveillance program. It is not intended to diagnose MRSA infection nor to guide or monitor treatment for MRSA infections. Performed at [facility], [place] [address], [place], [place] [address]      Discharge Instructions:   Discharge  Instructions    (HEART FAILURE PATIENTS) Call MD:  Anytime you have any of the following symptoms: 1) 3 pound weight gain in 24 hours or 5 pounds in 1 week 2) shortness of breath, with or without a dry hacking cough 3) swelling in the hands, feet or stomach 4) if you have to sleep on extra pillows at night in order to breathe.   Complete by: As directed    Ambulatory referral to Cardiology   Complete by: As directed    Follow up for pericardial effusion   Diet - low sodium heart healthy   Complete by: As directed    Heart Failure patients record your daily weight using the same scale at the same time of day   Complete by: As directed    Increase activity slowly   Complete by: As directed      Allergies as of 10/29/2020      Reactions   Strawberry (diagnostic) Anaphylaxis, Other (See Comments)   hives   Penicillins Itching   Has patient had a PCN reaction causing immediate rash, facial/tongue/throat swelling, SOB or lightheadedness with hypotension: No Has patient had a PCN reaction causing severe rash involving mucus membranes or skin necrosis: No Has patient had a PCN reaction that required hospitalization: Unknown Has patient had a PCN reaction occurring within the last 10 years: No If all of the above answers are "NO", then may proceed with Cephalosporin use.   Tape Other (See Comments)   Blisters   Gabapentin Itching, Rash, Other (See Comments)   Incoherent      Medication List    STOP taking these medications   Febuxostat 80 MG Tabs     TAKE these medications   albuterol 108 (90 Base) MCG/ACT inhaler Commonly known as: VENTOLIN HFA Inhale 2 puffs into the lungs every 6 (six) hours as needed for wheezing or shortness of breath.   allopurinol 100 MG tablet Commonly known as: ZYLOPRIM Take 1 tablet (100 mg total) by mouth daily as needed (gout attacks).   carvedilol 25 MG tablet Commonly known as: COREG Take 1 tablet (25 mg total) by mouth 2 (two) times daily.    cholecalciferol 25 MCG (1000 UNIT) tablet Commonly known as: VITAMIN D Take 1,000 Units by mouth at bedtime.   Entresto 49-51 MG Generic drug: sacubitril-valsartan Take 1 tablet by mouth  2 (two) times daily.   famotidine 40 MG tablet Commonly known as: PEPCID Take 40 mg by mouth every evening.   fluticasone-salmeterol 250-50 MCG/ACT Aepb Commonly known as: ADVAIR Inhale 1 puff into the lungs 2 (two) times daily.   glucose blood test strip Commonly known as: OneTouch Ultra Use to check blood sugar 4-5 times daily   ipratropium-albuterol 0.5-2.5 (3) MG/3ML Soln Commonly known as: DUONEB Take 3 mLs by nebulization every 6 (six) hours as needed (shortness of breath/wheezing).   levocetirizine 5 MG tablet Commonly known as: XYZAL Take 5 mg by mouth daily.   magnesium oxide 400 MG tablet Commonly known as: MAG-OX Take 400 mg by mouth daily.   montelukast 10 MG tablet Commonly known as: SINGULAIR TAKE 1 TABLET BY MOUTH  DAILY   omeprazole 40 MG capsule Commonly known as: PriLOSEC Take 1 capsule (40 mg total) by mouth daily.   potassium chloride 20 MEQ/15ML (10%) Soln Take 15 mLs (20 mEq total) by mouth daily as needed (with each dose of torsemide).   torsemide 20 MG tablet Commonly known as: DEMADEX Take 1 tablet (20 mg total) by mouth daily as needed (swelling/fluid/weight gain).   Vitamin B Complex Tabs Take 1 tablet by mouth daily.   vitamin C 500 MG tablet Commonly known as: ASCORBIC ACID Take 500 mg by mouth daily.       Follow-up Information    Billie Ruddy, MD Follow up in 1 week(s).   Specialty: Family Medicine Contact information: [place] [address] [phone]        Josue Hector, MD .   Specialty: Cardiology Contact information: [phone] [address] [place] [address] [phone]                Time coordinating discharge: 35 min  Signed:  Geradine Girt DO  [facility] 10/29/2020, 2:11 PM

## 2020-10-29 NOTE — Plan of Care (Signed)

## 2020-10-29 NOTE — Progress Notes (Signed)
SATURATION QUALIFICATIONS: (This note is used to comply with regulatory documentation for home oxygen)  Patient Saturations on Room Air at Rest = 95%  Patient Saturations on Room Air while Ambulating = 95%  Patient Saturations on 1 Liters of oxygen while Ambulating = 98%  Patient Saturations on 2 Liters of oxygen while Ambulating = 100%

## 2020-10-29 NOTE — Progress Notes (Signed)
Heart Failure Stewardship Pharmacist Progress Note   PCP: Billie Ruddy, MD PCP-Cardiologist: Jenkins Rouge, MD    HPI:  70 yo F w/ PMH of HFpEF, HTN, DM, mild asthma, and obesity who presented 6/4 for SOB from urgent care. CXR from urgent care revealed pulmonary edema. Patient initiated on BiPAP w/ improvement (off now). ECHO from 6/5 revealed EF 55-60% w/ mild LV hypertrophy and normal RV function. Hypertensive urgency upon admission w/ systolic BP >622W, resolved w/ nitro gtt.  Current HF Medications: - Carvedilol 25 mg BID - Entresto 49/51 mg BID  Prior to admission HF Medications: - Carvedilol 25 mg BID - Entresto 49/51 mg BID - Torsemide 20 mg PRN  Pertinent Lab Values: . Serum creatinine 0.9, BUN 22, Potassium 3.3, Sodium 138, BNP 490, Magnesium 1.7   Vital Signs: . Weight: 103.1 kg (admission weight: 104 kg) . Blood pressure: 144/71  . Heart rate: 80   Medication Assistance / Insurance Benefits Check: Does the patient have prescription insurance?  Yes Type of insurance plan: AARP  Does the patient qualify for medication assistance through manufacturers or grants?   Pending income . Eligible grants and/or patient assistance programs: [place] . Medication assistance applications in progress: N/A  . Medication assistance applications approved: N/A Approved medication assistance renewals will be completed by: pending  Outpatient Pharmacy:  Prior to admission outpatient pharmacy: [name] Is the patient willing to use [facility] at discharge? Yes Is the patient willing to transition their outpatient pharmacy to utilize a [facility] outpatient pharmacy?   Pending    Assessment: 1. Acute on chronic diastolic CHF (EF 97-98%), due to NICM (HTN). NYHA class II-III symptoms. - Continue Entresto 49/51mg  BID - Continue carvedilol 25mg  BID - Hypokalemic d/t diuresis, replenished w/ 40 mEq - Consider starting Farxiga   Plan: 1) Medication changes recommended at  this time: - Add Farxiga 10mg  daily - Continue other med  2) Patient assistance: - Farxiga copay $47/mo - Jardiance copay $47/mo  3)  Education  - Patient has been educated on current HF medications (Entresto, carvedilol, Farxiga). - Patient verbalizes understanding that over the next few months, these medication doses may change and more medications may be added to optimize HF regimen - Patient has been educated on basic disease state pathophysiology and goals of therapy - Time spent (30 min)    Marya Fossa [place]) [place], PharmD Student

## 2020-10-29 NOTE — TOC Transition Note (Signed)
Transition of Care [facility]) - CM/SW Discharge Note   Patient Details  Name: REIANA POTEET MRN: [id number] Date of Birth: 04-Jan-1951  [facility]) CM/SW Contact:  Zenon Mayo, RN Phone Number: 10/29/2020, 4:29 PM   Clinical Narrative:    Patient is for dc today, she will be on farxiga, TOC filled the first 30 days free for patient and they brought the medication to her room.  Patient daughter will be transporting her home  Around 7 pm today.  Patient will not need any oxygen per ambulatory saturation eval.  Patient states her daughter takes her to her MD apt.  She states she does try to consume a low sodium diet, but she needs to do better.  She has a scale but does not have a bp cuff, she will purchase a bp cuff from a pharmacy.     Final next level of care: Home/Self Care Barriers to Discharge: No Barriers Identified   Patient Goals and CMS Choice Patient states their goals for this hospitalization and ongoing recovery are:: return home   Choice offered to / list presented to : NA  Discharge Placement                       Discharge Plan and Services                  DME Agency: NA       HH Arranged: NA          Social Determinants of Health (SDOH) Interventions     Readmission Risk Interventions No flowsheet data found.

## 2020-10-30 ENCOUNTER — Telehealth: Payer: Self-pay

## 2020-10-30 NOTE — Progress Notes (Signed)
[facility]  PCP: Grier Mitts Primary Cardiologist: Jenkins Rouge  HPI:  Beth Macias is a 70 y.o.  female  with a PMH significant for systolic CHF with recovery, HTN, T2DM, asthma, morbid obesity  Cardiac history began 06/17/2018 referred to cardiology for dyspnea diagnosed in the outpatient setting with systolic CHF.  Echo 11/16/18  EF 30-35% with moderate MR Small to moderate sized pericardial effusion. Nuclear Stress Test 12/13/18 normal perfusion EF 32%.  Placed on GDMT coreg and entresto, did not tolerate max dose Entresto.  Had hives with lasix took demadex PRN for diuretic.     ECHO 05/23/20 improved EF to 50-55%, moderate pericardial effusion no tamponade, trivial MR. EF remained normalized on GDMT at follow up visits pericardial effusion stable.  Doing relatively well until 10/27/2020 when she presented to [facility] ED with 4 days of increasing shortness of breath.  Arrived severely hypertensive BP 225-250/112-116 started on NTG infusion.  BNP 489, creatinine 0.75, Troponin level was 14 and then 15, chest x-ray with pulmonary edema. Repeat ECHO 10/2020 with EF 55-60%, mild LVH, normal RV function, moderate pericardial effusion without tamponade. Started on BiPAP therapy and IV lasix, entresto and carvedilol continued.  Wt 231->227.    Her weight has been slowly decreasing since discharge, 226->222 by her home scales, also 4lbs down by ours, she has been taking her torsemide daily. She tells me she feels better since hospitalization, exercising on the exercise bike for 10 minutes, does get short of breath after that feels very deconditioned.  Does not have to stop when going up stairs for shortness of breath, most limited by leg pain and sciatica.  Gets short of breath in the heat lately outside but feels this is more due to her asthma.  We discussed her medications extensively she would like to discontinue as many as possible.  Went through at length the  benefits she gets from each GDMT agent.    ROS: All systems negative except as listed in HPI, PMH and Problem List.  SH:  Social History   Socioeconomic History   Marital status: Legally Separated    Spouse name: Not on file   Number of children: 3   Years of education: Not on file   Highest education level: Not on file  Occupational History   Occupation: Teacher  Tobacco Use   Smoking status: Former    Packs/day: 0.25    Years: 9.00    Pack years: 2.25    Types: Cigarettes    Quit date: 05/26/1976    Years since quitting: 44.4   Smokeless tobacco: Never  Vaping Use   Vaping Use: Never used  Substance and Sexual Activity   Alcohol use: Never   Drug use: Never   Sexual activity: Yes  Other Topics Concern   Not on file  Social History Narrative   Not on file   Social Determinants of Health   Financial Resource Strain: Low Risk    Difficulty of Paying Living Expenses: Not hard at all  Food Insecurity: No Food Insecurity   Worried About Charity fundraiser in the Last Year: Never true   [place] in the Last Year: Never true  Transportation Needs: No Transportation Needs   Lack of Transportation (Medical): No   Lack of Transportation (Non-Medical): No  Physical Activity: Insufficiently Active   Days of Exercise per Week: 3 days   Minutes of Exercise per Session: 30 min  Stress:  No Stress Concern Present   Feeling of Stress : Not at all  Social Connections: Socially Isolated   Frequency of Communication with Friends and Family: More than three times a week   Frequency of Social Gatherings with Friends and Family: More than three times a week   Attends Religious Services: Never   Marine scientist or Organizations: No   Attends Music therapist: Never   Marital Status: Separated  Human resources officer Violence: Not At Risk   Fear of Current or Ex-Partner: No   Emotionally Abused: No   Physically Abused: No   Sexually Abused: No    FH:   Family History  Problem Relation Age of Onset   Heart failure Mother    Gout Mother    Hypertension Mother    Diabetes Mother    Heart failure Father    Hypertension Father    AAA (abdominal aortic aneurysm) Father    Gout Brother    Hypertension Brother    Heart attack Brother    Colon cancer Maternal Uncle    Crohn's disease Paternal Aunt    Breast cancer Neg Hx     Past Medical History:  Diagnosis Date   Asthma    Blood in stool    Colon polyps    Diabetes mellitus without complication (HCC)    Diverticulitis    Fibromyalgia    GERD (gastroesophageal reflux disease)    Gout    Hypercholesteremia    Hypertension    Pneumonia    Pneumonia    UTI (urinary tract infection)     Current Outpatient Medications  Medication Sig Dispense Refill   albuterol (VENTOLIN HFA) 108 (90 Base) MCG/ACT inhaler Inhale 2 puffs into the lungs every 6 (six) hours as needed for wheezing or shortness of breath.     allopurinol (ZYLOPRIM) 100 MG tablet Take 1 tablet (100 mg total) by mouth daily as needed (gout attacks).     carvedilol (COREG) 25 MG tablet Take 1 tablet (25 mg total) by mouth 2 (two) times daily. 180 tablet 3   cholecalciferol (VITAMIN D) 25 MCG (1000 UNIT) tablet Take 1,000 Units by mouth at bedtime.      Cyanocobalamin (CVS B12 GUMMIES PO) Take 1,000 mcg by mouth daily.     dapagliflozin propanediol (FARXIGA) 10 MG TABS tablet Take 1 tablet (10 mg total) by mouth daily before breakfast. 30 tablet 0   famotidine (PEPCID) 40 MG tablet Take 40 mg by mouth every evening.     fluticasone-salmeterol (ADVAIR) 250-50 MCG/ACT AEPB Inhale 1 puff into the lungs in the morning and at bedtime.     glucose blood (ONETOUCH ULTRA) test strip Use to check blood sugar 4-5 times daily 50 each 5   ipratropium-albuterol (DUONEB) 0.5-2.5 (3) MG/3ML SOLN Take 3 mLs by nebulization every 6 (six) hours as needed (shortness of breath/wheezing).     levocetirizine (XYZAL) 5 MG tablet Take 5 mg by  mouth daily.     magnesium oxide (MAG-OX) 400 MG tablet Take 400 mg by mouth daily.     montelukast (SINGULAIR) 10 MG tablet TAKE 1 TABLET BY MOUTH  DAILY 90 tablet 3   omeprazole (PRILOSEC) 40 MG capsule Take 1 capsule (40 mg total) by mouth daily. 30 capsule 6   potassium chloride 20 MEQ/15ML (10%) SOLN Take 15 mLs (20 mEq total) by mouth daily as needed (with each dose of torsemide).     sacubitril-valsartan (ENTRESTO) 49-51 MG Take 1 tablet by mouth  2 (two) times daily. 60 tablet 11   torsemide (DEMADEX) 20 MG tablet Take 1 tablet (20 mg total) by mouth daily as needed (swelling/fluid/weight gain).     vitamin C (ASCORBIC ACID) 500 MG tablet Take 500 mg by mouth daily.     No current facility-administered medications for this encounter.    Vitals:   11/06/20 1004  BP: 126/70  Pulse: 68  SpO2: 98%  Weight: 101.3 kg (223 lb 4 oz)    PHYSICAL EXAM: Cardiac: JVD difficult to assess, normal rate and rhythm, clear s1 and s2, 2/6 MR, no rubs or gallops, trace-1+ LE edema Pulmonary: mildly diminished in bases otherwise clear, not in distress Abdominal: non distended abdomen, soft and nontender Psych: Alert, conversant, in good spirits    ASSESSMENT & PLAN: Chronic systolic CHF with recovery: -Echo 11/16/18  EF 30-35% with moderate MR Small to moderate sized pericardial effusion.  -Nuclear Stress Test 12/13/18 normal perfusion EF 32%. -11/16/18  EF 30-35% with moderate MR Small to moderate sized pericardial effusion -ECHO 10/2020 with EF 55-60%, mild LVH, normal RV function, moderate pericardial effusion without tamponade  -NYHA Class II-III symptoms, remains volume overloaded, REDS 41% -Currently on carvedilol 25mg  BID, entresto 49/51 BID, Farxiga 10, torsemide 20 PRN -Losing fluid weight steadily will switch torsemide to daily x7 days with K supplementation then she can switch back to PRN -Extensive back and forth about need for GDMT, she is at high risk of self discontinuing therapy.   We discussed discontinuing vitamin C today in order to balance out that she was started on farxiga, she seemed happy with this.  Therefore we will not escalate though she would definitely benefit from addition of spironolactone, perhaps in time.  HTN:  -well controlled on GDMT above  T2DM:  -continue farxiga  Asthma: -seems to have triggers of pollen and heat -continue inhalers, pcp follow up  Morbid obesity: -she has changed her diet, exercising more  Follow up with general cardiology

## 2020-10-30 NOTE — Telephone Encounter (Signed)
Transition Care Management Unsuccessful Follow-up Telephone Call  Date of discharge and from where:  10/29/2020  [facility]   Attempts:  1st Attempt  Reason for unsuccessful TCM follow-up call:  Left voice message

## 2020-10-31 NOTE — Telephone Encounter (Signed)
lmtcb for pt.    Pt needs OV, pt last seen in 2020.

## 2020-11-01 NOTE — Telephone Encounter (Signed)
Called and left a detailed message for pt to call back to schedule an appt. Will close encounter per triage protocol as we have tried to call pt twice without a return call.

## 2020-11-05 ENCOUNTER — Telehealth (HOSPITAL_COMMUNITY): Payer: Self-pay | Admitting: Licensed Clinical Social Worker

## 2020-11-05 NOTE — Telephone Encounter (Signed)
CSW contacted patient to remind of [facility] appointment tomorrow. Patient confirmed. Raquel Sarna, [place], [place]

## 2020-11-06 ENCOUNTER — Ambulatory Visit (HOSPITAL_COMMUNITY)
Admit: 2020-11-06 | Discharge: 2020-11-06 | Disposition: A | Discharge status: Home / Self Care | Payer: Medicare Other | Source: Ambulatory Visit | Attending: Internal Medicine | Admitting: Internal Medicine

## 2020-11-06 ENCOUNTER — Other Ambulatory Visit: Payer: Self-pay

## 2020-11-06 VITALS — BP 126/70 | HR 68 | Wt 223.2 lb

## 2020-11-06 DIAGNOSIS — Z79899 Other long term (current) drug therapy: Secondary | ICD-10-CM | POA: Insufficient documentation

## 2020-11-06 DIAGNOSIS — I5032 Chronic diastolic (congestive) heart failure: Secondary | ICD-10-CM | POA: Diagnosis not present

## 2020-11-06 DIAGNOSIS — E119 Type 2 diabetes mellitus without complications: Secondary | ICD-10-CM | POA: Diagnosis not present

## 2020-11-06 DIAGNOSIS — Z8249 Family history of ischemic heart disease and other diseases of the circulatory system: Secondary | ICD-10-CM | POA: Insufficient documentation

## 2020-11-06 DIAGNOSIS — Z7984 Long term (current) use of oral hypoglycemic drugs: Secondary | ICD-10-CM | POA: Diagnosis not present

## 2020-11-06 DIAGNOSIS — J45909 Unspecified asthma, uncomplicated: Secondary | ICD-10-CM | POA: Diagnosis not present

## 2020-11-06 DIAGNOSIS — Z7951 Long term (current) use of inhaled steroids: Secondary | ICD-10-CM | POA: Diagnosis not present

## 2020-11-06 DIAGNOSIS — Z87891 Personal history of nicotine dependence: Secondary | ICD-10-CM | POA: Diagnosis not present

## 2020-11-06 DIAGNOSIS — R0602 Shortness of breath: Secondary | ICD-10-CM | POA: Diagnosis not present

## 2020-11-06 DIAGNOSIS — Z7901 Long term (current) use of anticoagulants: Secondary | ICD-10-CM | POA: Diagnosis not present

## 2020-11-06 DIAGNOSIS — I313 Pericardial effusion (noninflammatory): Secondary | ICD-10-CM | POA: Diagnosis not present

## 2020-11-06 DIAGNOSIS — I5022 Chronic systolic (congestive) heart failure: Secondary | ICD-10-CM

## 2020-11-06 DIAGNOSIS — I110 Hypertensive heart disease with heart failure: Secondary | ICD-10-CM | POA: Insufficient documentation

## 2020-11-06 DIAGNOSIS — I10 Essential (primary) hypertension: Secondary | ICD-10-CM

## 2020-11-06 LAB — BASIC METABOLIC PANEL
Anion gap: 10 (ref 5–15)
BUN: 19 mg/dL (ref 8–23)
CO2: 33 mmol/L — ABNORMAL HIGH (ref 22–32)
Calcium: 9.8 mg/dL (ref 8.9–10.3)
Chloride: 94 mmol/L — ABNORMAL LOW (ref 98–111)
Creatinine, Ser: 1.01 mg/dL — ABNORMAL HIGH (ref 0.44–1.00)
GFR, Estimated: 60 mL/min — ABNORMAL LOW (ref >60)
Glucose, Bld: 124 mg/dL — ABNORMAL HIGH (ref 70–99)
Potassium: 3.2 mmol/L — ABNORMAL LOW (ref 3.5–5.1)
Sodium: 137 mmol/L (ref 135–145)

## 2020-11-06 MED ORDER — DAPAGLIFLOZIN PROPANEDIOL 10 MG PO TABS: 10.0000 mg | ORAL_TABLET | Freq: Every day | ORAL | 0 refills | Status: DC

## 2020-11-06 NOTE — Patient Instructions (Signed)
Stop taking Vitamin C  Take Torsemide DAILY for 1 week ONLY, then take only AS NEEDED for increased shortness of breath or swelling  Labs done today, your results will be available in MyChart, we will contact you for abnormal readings.  Thank you for allowing [place] to provider your heart failure care after your recent hospitalization. Please follow-up with [facility], they will call you for an appointment  If you have any questions, issues, or concerns before your next appointment please call our office at [phone], opt. 2 and leave a message for the triage nurse.  Do the following things EVERYDAY: Weigh yourself in the morning before breakfast. Write it down and keep it in a log. Take your medicines as prescribed Eat low salt foods--Limit salt (sodium) to 2000 mg per day.  Stay as active as you can everyday Limit all fluids for the day to less than 2 liters

## 2020-11-06 NOTE — Progress Notes (Signed)
ReDS Vest / Clip - 11/06/20 1000       ReDS Vest / Clip   Station Marker A    Ruler Value 30    ReDS Value Range High volume overload    ReDS Actual Value 41

## 2020-11-13 ENCOUNTER — Telehealth: Payer: Self-pay | Admitting: Cardiovascular Disease

## 2020-11-13 NOTE — Telephone Encounter (Signed)
Called patient back. Informed patient that her PCP should be ordering her glucose machine since she is the doctor who monitors her glucose levels. Patient agreed and will call her PCP.

## 2020-11-13 NOTE — Telephone Encounter (Signed)
Beth Macias is calling requesting a prescription be written and sent to [facility] on file for her to receive a new blood glucose monitor due to hers falling a part. States she has had hers for 20 years and the hospital advised her a new one would need to come from our office. Please advise.

## 2020-11-16 NOTE — Progress Notes (Addendum)
Telephone Visit  This visit type was conducted due to national recommendations for restrictions regarding the COVID-19 Pandemic (e.g. social distancing) in an effort to limit this patient's exposure and mitigate transmission in our community.  Due to her co-morbid illnesses, this patient is at least at moderate risk for complications without adequate follow up.  This format is felt to be most appropriate for this patient at this time.  All issues noted in this document were discussed and addressed.  A limited physical exam was performed with this format.  Please refer to the patient's chart for her consent to telehealth for [facility].   Patient Location: Home Physician Location: Office   Date:  11/30/2020   ID:  Beth Macias, DOB 1951/01/18, MRN [id number]  PCP:  Billie Ruddy, MD  Cardiologist:   Johnsie Cancel Electrophysiologist:  None   Evaluation Performed:  Follow-Up Visit  Chief Complaint:  CHF  History of Present Illness:    Beth Macias is a 70 y.o. 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       Echo done 11/16/18 reviewed EF 30-35% with moderate MR Small to moderate sized pericardial effusion Myovue done 12/13/18 normal perfusion EF 32%   Has had issues with hives since her 30's They got worse when she was started on pulmonary and CHF meds. Not clear culprit. But improved and not thought to be from heart meds. Seen 04/01/19 pharm D and entresto dose kept with increase in coreg. Patient does not like taking diuretic when she goes out Did not tolerate max dose Entresto   TTE done 05/23/20 improved EF to 50-55% with moderate Pericardial effusion no tamponade MR only trivial   TTE done 10/28/20 EF 55-60% no change in effusion  AV sclerosis   Was in hospital 6/4-6 for asthma flair She road to [place] With a friend and got exposed to some allergens CXR was ok BNP was 489 and BP was elevated   She feels much better now   [name] is her  new grand daughter spends time with her daily     Past Medical History:  Diagnosis Date   Asthma    Blood in stool    Colon polyps    Diabetes mellitus without complication (HCC)    Diverticulitis    Fibromyalgia    GERD (gastroesophageal reflux disease)    Gout    Hypercholesteremia    Hypertension    Pneumonia    Pneumonia    UTI (urinary tract infection)    Past Surgical History:  Procedure Laterality Date   APPENDECTOMY     CESAREAN SECTION     x 3   LIPOMA EXCISION     MENISCUS REPAIR Left    OVARIAN CYST REMOVAL     OVARIAN CYST SURGERY       Current Meds  Medication Sig   albuterol (VENTOLIN HFA) 108 (90 Base) MCG/ACT inhaler Inhale 2 puffs into the lungs every 6 (six) hours as needed for wheezing or shortness of breath.   allopurinol (ZYLOPRIM) 100 MG tablet Take 1 tablet (100 mg total) by mouth daily as needed (gout attacks).   blood glucose meter kit and supplies KIT Dispense based on patient and insurance preference. Use up to three times daily as directed.   carvedilol (COREG) 25 MG tablet Take 1 tablet (25 mg total) by mouth 2 (two) times daily.   cholecalciferol (VITAMIN D) 25 MCG (1000 UNIT) tablet Take 1,000 Units by  mouth at bedtime.    Cyanocobalamin (CVS B12 GUMMIES PO) Take 1,000 mcg by mouth daily.   dapagliflozin propanediol (FARXIGA) 10 MG TABS tablet Take 1 tablet (10 mg total) by mouth daily before breakfast.   famotidine (PEPCID) 40 MG tablet Take 40 mg by mouth every evening.   fluticasone-salmeterol (ADVAIR) 250-50 MCG/ACT AEPB Inhale 1 puff into the lungs in the morning and at bedtime.   glucose blood (ONETOUCH ULTRA) test strip Use to check blood sugar 4-5 times daily   ipratropium-albuterol (DUONEB) 0.5-2.5 (3) MG/3ML SOLN Take 3 mLs by nebulization every 6 (six) hours as needed (shortness of breath/wheezing).   levocetirizine (XYZAL) 5 MG tablet Take 5 mg by mouth daily.   magnesium oxide (MAG-OX) 400 MG tablet Take 400 mg by mouth daily.    montelukast (SINGULAIR) 10 MG tablet TAKE 1 TABLET BY MOUTH  DAILY   omeprazole (PRILOSEC) 40 MG capsule Take 1 capsule (40 mg total) by mouth daily.   potassium chloride 20 MEQ/15ML (10%) SOLN Take 15 mLs (20 mEq total) by mouth daily as needed (with each dose of torsemide).   sacubitril-valsartan (ENTRESTO) 49-51 MG Take 1 tablet by mouth 2 (two) times daily.   torsemide (DEMADEX) 20 MG tablet Take 1 tablet (20 mg total) by mouth daily as needed (swelling/fluid/weight gain).     Allergies:   Strawberry (diagnostic), Penicillins, Tape, and Gabapentin   Social History   Tobacco Use   Smoking status: Former    Packs/day: 0.25    Years: 9.00    Pack years: 2.25    Types: Cigarettes    Quit date: 05/26/1976    Years since quitting: 44.5   Smokeless tobacco: Never  Vaping Use   Vaping Use: Never used  Substance Use Topics   Alcohol use: Never   Drug use: Never     Family Hx: The patient's family history includes AAA (abdominal aortic aneurysm) in her father; Colon cancer in her maternal uncle; Crohn's disease in her paternal aunt; Diabetes in her mother; Gout in her brother and mother; Heart attack in her brother; Heart failure in her father and mother; Hypertension in her brother, father, and mother. There is no history of Breast cancer.  ROS:   Please see the history of present illness.     All other systems reviewed and are negative.   Prior CV studies:   The following studies were reviewed today:  Echo 11/16/18 Echo 05/23/20  Echo 10/28/20  Myovue 12/13/18  LE venous duplex 04/29/19   Labs/Other Tests and Data Reviewed:    EKG:  11/30/2020 SR rate 65 poor R wave progression   Recent Labs: 02/29/2020: ALT 12 09/24/2020: Pro B Natriuretic peptide (BNP) 276.0 10/27/2020: B Natriuretic Peptide 489.8; Magnesium 1.7 10/29/2020: Hemoglobin 12.7; Platelets 231 11/06/2020: BUN 19; Creatinine, Ser 1.01; Potassium 3.2; Sodium 137   Recent Lipid Panel Lab Results  Component Value  Date/Time   CHOL 233 (H) 10/27/2020 09:59 PM   CHOL 191 02/29/2020 10:10 AM   TRIG 162 (H) 10/27/2020 09:59 PM   HDL 60 10/27/2020 09:59 PM   HDL 59 02/29/2020 10:10 AM   CHOLHDL 3.9 10/27/2020 09:59 PM   LDLCALC 141 (H) 10/27/2020 09:59 PM   LDLCALC 109 (H) 02/29/2020 10:10 AM    Wt Readings from Last 3 Encounters:  11/30/20 102.1 kg  11/21/20 102.6 kg  11/06/20 101.3 kg     Objective:    Vital Signs:  BP (!) 180/88   Pulse 71   Ht  _0  (1.549 m)   Wt 102.1 kg   BMI 42.53 kg/m    Telephone no exam     ASSESSMENT & PLAN:    CHF:  Non ischemic DCM continue coreg, and entresto On demedex ( hives when on lasix/aldactone) she takes her diuretic when not going out for the day  No changes for now EF improved by TTE 16/5/22 55-60%   Pulmonary:  F/u Wert continue symbicort and pro Air  LE venous duplex 04/29/19 showed no DVT  GERD:  Continue prilosec   Pericardial Effusion:  Asymptomatic  stable over serial echos f/u echo as needed and in a year if no symptoms for DCM     Medication Adjustments/Labs and Tests Ordered: Current medicines are reviewed at length with the patient today.  Concerns regarding medicines are outlined above.   Tests Ordered:  None   Medication Changes:  None   Time:  spent reviewing echo's labs direct patient interview and composing note 20 minutes   F/U in  6 months   Signed, Jenkins Rouge, MD  11/30/2020 9:01 AM    [place]

## 2020-11-20 ENCOUNTER — Ambulatory Visit: Payer: Medicare Other | Admitting: Nurse Practitioner

## 2020-11-21 ENCOUNTER — Ambulatory Visit (INDEPENDENT_AMBULATORY_CARE_PROVIDER_SITE_OTHER): Payer: Medicare Other | Admitting: Family Medicine

## 2020-11-21 ENCOUNTER — Encounter: Payer: Self-pay | Admitting: Family Medicine

## 2020-11-21 ENCOUNTER — Other Ambulatory Visit: Payer: Self-pay

## 2020-11-21 VITALS — BP 150/90 | HR 60 | Temp 98.4°F | Wt 226.2 lb

## 2020-11-21 DIAGNOSIS — E119 Type 2 diabetes mellitus without complications: Secondary | ICD-10-CM | POA: Diagnosis not present

## 2020-11-21 DIAGNOSIS — H269 Unspecified cataract: Secondary | ICD-10-CM

## 2020-11-21 DIAGNOSIS — I5022 Chronic systolic (congestive) heart failure: Secondary | ICD-10-CM | POA: Diagnosis not present

## 2020-11-21 DIAGNOSIS — I10 Essential (primary) hypertension: Secondary | ICD-10-CM | POA: Diagnosis not present

## 2020-11-21 MED ORDER — BLOOD GLUCOSE MONITOR KIT
PACK | 0 refills | Status: DC
Start: 2020-11-21 — End: 2020-11-22

## 2020-11-21 NOTE — Progress Notes (Signed)
Subjective:    Patient ID: Beth Macias, female    DOB: [birth year], 70 y.o.   MRN: [id number]  Chief Complaint  Patient presents with   Diabetes    HPI Patient was seen today for f/u on DM.   Needs new glucometer.  Current glucometer does not always work.  Had x 20 yrs.  Wt staying consistent at 222 lbs with home scale.  Pt did not take lasix this am as she had this appt.  Plans to take when gets home.  Working on losing wt.  Has a garden at home.  Notes improvement in SOB.    Past Medical History:  Diagnosis Date   Asthma    Blood in stool    Colon polyps    Diabetes mellitus without complication (HCC)    Diverticulitis    Fibromyalgia    GERD (gastroesophageal reflux disease)    Gout    Hypercholesteremia    Hypertension    Pneumonia    Pneumonia    UTI (urinary tract infection)     Allergies  Allergen Reactions   Strawberry (Diagnostic) Anaphylaxis and Other (See Comments)    hives   Penicillins Itching    Has patient had a PCN reaction causing immediate rash, facial/tongue/throat swelling, SOB or lightheadedness with hypotension: No Has patient had a PCN reaction causing severe rash involving mucus membranes or skin necrosis: No Has patient had a PCN reaction that required hospitalization: Unknown Has patient had a PCN reaction occurring within the last 10 years: No If all of the above answers are "NO", then may proceed with Cephalosporin use.   Tape Other (See Comments)    Blisters   Gabapentin Itching, Rash and Other (See Comments)    Incoherent    ROS General: Denies fever, chills, night sweats, changes in weight, changes in appetite HEENT: Denies headaches, ear pain, changes in vision, rhinorrhea, sore throat CV: Denies CP, palpitations, SOB, orthopnea Pulm: Denies SOB, cough, wheezing GI: Denies abdominal pain, nausea, vomiting, diarrhea, constipation GU: Denies dysuria, hematuria, frequency, vaginal discharge Msk: Denies muscle cramps, joint  pains Neuro: Denies weakness, numbness, tingling Skin: Denies rashes, bruising Psych: Denies depression, anxiety, hallucinations     Objective:    Blood pressure (!) 150/90, pulse 60, temperature 98.4 F (36.9 C), temperature source Oral, weight 226 lb 3.2 oz (102.6 kg), SpO2 97 %.  Gen. Pleasant, well-nourished, in no distress, normal affect   HEENT: [place]/AT, face symmetric, conjunctiva clear, no scleral icterus, PERRLA, EOMI, nares patent without drainage. Lungs: no accessory muscle use, CTAB, no wheezes or rales Cardiovascular: RRR, no m/r/g, no peripheral edema Abdomen: BS present, soft, NT/ND Musculoskeletal: No deformities, no cyanosis or clubbing, normal tone Neuro:  A&Ox3, CN II-XII intact, normal gait Skin:  Warm, no lesions/ rash  Wt Readings from Last 3 Encounters:  11/21/20 226 lb 3.2 oz (102.6 kg)  11/06/20 223 lb 4 oz (101.3 kg)  10/29/20 227 lb 4.7 oz (103.1 kg)    Lab Results  Component Value Date   WBC 6.8 10/29/2020   HGB 12.7 10/29/2020   HCT 39.7 10/29/2020   PLT 231 10/29/2020   GLUCOSE 124 (H) 11/06/2020   CHOL 233 (H) 10/27/2020   TRIG 162 (H) 10/27/2020   HDL 60 10/27/2020   LDLCALC 141 (H) 10/27/2020   ALT 12 02/29/2020   AST 12 02/29/2020   NA 137 11/06/2020   K 3.2 (L) 11/06/2020   CL 94 (L) 11/06/2020   CREATININE 1.01 (H) 11/06/2020  BUN 19 11/06/2020   CO2 33 (H) 11/06/2020   TSH 0.552 03/02/2019   HGBA1C 7.0 (H) 09/24/2020    Assessment/Plan:  Type 2 diabetes mellitus without complication, without long-term current use of insulin (HCC)  -controlled -hgb A1C 7.0% on 09/24/20 -continue current meds including farxiga  -on ARB -lifestyle modifications - Plan: blood glucose meter kit and supplies KIT  Chronic systolic CHF (congestive heart failure) (HCC) -stable -continue current meds coreg, torsemide, entresto -daily wts -continue f/u with Cardiology  Essential hypertension -elevated  -encouraged to take all meds  daily -lifestyle modifications -continue current meds  Cataract(s) -encouraged to keep f/u appt with Ophthalmology  F/u in 1-2 months  Grier Mitts, MD

## 2020-11-22 ENCOUNTER — Other Ambulatory Visit: Payer: Self-pay | Admitting: *Deleted

## 2020-11-22 DIAGNOSIS — E119 Type 2 diabetes mellitus without complications: Secondary | ICD-10-CM

## 2020-11-22 MED ORDER — BLOOD GLUCOSE MONITOR KIT: PACK | 0 refills | Status: DC

## 2020-11-30 ENCOUNTER — Other Ambulatory Visit: Payer: Self-pay

## 2020-11-30 ENCOUNTER — Telehealth (INDEPENDENT_AMBULATORY_CARE_PROVIDER_SITE_OTHER): Payer: Medicare Other | Admitting: Cardiovascular Disease

## 2020-11-30 VITALS — BP 180/88 | HR 71 | Ht 61.0 in | Wt 225.1 lb

## 2020-11-30 DIAGNOSIS — I10 Essential (primary) hypertension: Secondary | ICD-10-CM

## 2020-11-30 DIAGNOSIS — I5032 Chronic diastolic (congestive) heart failure: Secondary | ICD-10-CM

## 2020-11-30 DIAGNOSIS — J4520 Mild intermittent asthma, uncomplicated: Secondary | ICD-10-CM

## 2020-11-30 DIAGNOSIS — I313 Pericardial effusion (noninflammatory): Secondary | ICD-10-CM

## 2020-11-30 DIAGNOSIS — I3139 Other pericardial effusion (noninflammatory): Secondary | ICD-10-CM

## 2020-11-30 NOTE — Patient Instructions (Signed)
Medication Instructions:  *If you need a refill on your cardiac medications before your next appointment, please call your pharmacy*   Lab Work: If you have labs (blood work) drawn today and your tests are completely normal, you will receive your results only by: MyChart Message (if you have MyChart) OR A paper copy in the mail If you have any lab test that is abnormal or we need to change your treatment, we will call you to review the results.  Follow-Up: At [facility], you and your health needs are our priority.  As part of our continuing mission to provide you with exceptional heart care, we have created designated Provider Care Teams.  These Care Teams include your primary Cardiologist (physician) and Advanced Practice Providers (APPs -  Physician Assistants and Nurse Practitioners) who all work together to provide you with the care you need, when you need it.  We recommend signing up for the patient portal called "MyChart".  Sign up information is provided on this After Visit Summary.  MyChart is used to connect with patients for Virtual Visits (Telemedicine).  Patients are able to view lab/test results, encounter notes, upcoming appointments, etc.  Non-urgent messages can be sent to your provider as well.   To learn more about what you can do with MyChart, go to [email or web address].    Your next appointment:   6 month(s)  The format for your next appointment:   In Person  Provider:   You may see Peter Nishan, MD or one of the following Advanced Practice Providers on your designated Care Team:   Laura Ingold, NP  

## 2020-12-11 ENCOUNTER — Encounter: Payer: Self-pay | Admitting: Family Medicine

## 2020-12-17 ENCOUNTER — Other Ambulatory Visit: Payer: Self-pay | Admitting: Pulmonary Disease

## 2020-12-17 DIAGNOSIS — K219 Gastro-esophageal reflux disease without esophagitis: Secondary | ICD-10-CM

## 2020-12-19 ENCOUNTER — Ambulatory Visit: Payer: Medicare Other | Admitting: Nurse Practitioner

## 2020-12-19 ENCOUNTER — Telehealth: Payer: Self-pay

## 2020-12-19 NOTE — Telephone Encounter (Signed)
No show letter mailed to patient. 

## 2020-12-20 ENCOUNTER — Telehealth: Payer: Self-pay | Admitting: Gastroenterology

## 2020-12-20 NOTE — Telephone Encounter (Signed)
Spoke with the patient. She reports 3 days in a row of bright red blood after her bowel movement. Takes daily stool softener. Denies constipation. Has an appointment with Darrell Jewel 01/17/21. She denies any rectal pain, abd pain or nausea. Stools are brown. History of hemorrhoids and banding x 2 in 2020. Please advise.

## 2020-12-20 NOTE — Telephone Encounter (Signed)
Pt has been experiencing rectal bleeding on and off. She is not sure if it is related to hemorrhoids. She stated to have had banding treatment in 2020 to get rid of them so she is not sure of why bleeding is happening again. She is scheduled to see APP on 8/25 but would like some advise in the meantime.

## 2020-12-21 ENCOUNTER — Other Ambulatory Visit: Payer: Self-pay

## 2020-12-21 MED ORDER — HYDROCORTISONE ACETATE 25 MG RE SUPP: 25.0000 mg | Freq: Every day | RECTAL | 0 refills | Status: DC

## 2020-12-21 NOTE — Telephone Encounter (Signed)
Patient instructed. Appointment moved to 01/24/21 with Dr Silverio Decamp.

## 2020-12-21 NOTE — Telephone Encounter (Signed)
Please send prescription for Anusol suppository daily at bedtime for 7 days.  Continue stool softener.  Add Benefiber 1 tablespoon twice daily with meals.  Please check if she can be scheduled with me soon instead so I can evaluate and do additional hemorrhoidal banding if needed.  Thank you

## 2020-12-24 ENCOUNTER — Other Ambulatory Visit: Payer: Self-pay

## 2020-12-24 NOTE — Telephone Encounter (Signed)
Please advise. OTC?

## 2020-12-24 NOTE — Telephone Encounter (Addendum)
The patient is advised. Message left for the [facility] pharmacist or tech to cancel the Rx.

## 2020-12-24 NOTE — Telephone Encounter (Signed)
Inbound call from patient. States the suppository is too expensive $106. Not approved by her insurance. She have medicare with aarp secondary. Ask if there is something else that will help. Best contact contact [phone]

## 2020-12-24 NOTE — Telephone Encounter (Signed)
Please advise patient to use preparation H suppository (OTC) instead at bedtime X 7 day. Thanks

## 2021-01-07 ENCOUNTER — Other Ambulatory Visit (HOSPITAL_COMMUNITY): Payer: Self-pay | Admitting: Internal Medicine

## 2021-01-17 ENCOUNTER — Ambulatory Visit: Payer: Medicare Other | Admitting: Physician Assistant

## 2021-01-21 ENCOUNTER — Other Ambulatory Visit (HOSPITAL_COMMUNITY): Payer: Self-pay

## 2021-01-24 ENCOUNTER — Ambulatory Visit (INDEPENDENT_AMBULATORY_CARE_PROVIDER_SITE_OTHER): Payer: Medicare Other | Admitting: Gastroenterology

## 2021-01-24 ENCOUNTER — Encounter: Payer: Self-pay | Admitting: Gastroenterology

## 2021-01-24 VITALS — BP 154/82 | HR 62 | Ht 61.0 in | Wt 224.2 lb

## 2021-01-24 DIAGNOSIS — K581 Irritable bowel syndrome with constipation: Secondary | ICD-10-CM | POA: Diagnosis not present

## 2021-01-24 DIAGNOSIS — K625 Hemorrhage of anus and rectum: Secondary | ICD-10-CM

## 2021-01-24 DIAGNOSIS — K641 Second degree hemorrhoids: Secondary | ICD-10-CM | POA: Diagnosis not present

## 2021-01-24 MED ORDER — HYDROCORTISONE (PERIANAL) 2.5 % EX CREA: 1.0000 "application " | TOPICAL_CREAM | Freq: Two times a day (BID) | CUTANEOUS | 1 refills | Status: DC

## 2021-01-24 NOTE — Progress Notes (Signed)
Beth Macias    [id number]    May 26, 1951  Primary Care Physician:Banks, Langley Adie, MD  Referring Physician: Billie Ruddy, MD [place],  [place] [id number]   Chief complaint:  Rectal bleeding  HPI:  70 year old female here with complaints of bright red blood per rectum , had an episode 2 weeks ago after she took Advil, bleeding somewhat improved with using suppositories. She has noticed small amount of bright red blood when she was wiping after bowel movement yesterday.  But she is not noticing any blood in the toilet bowl.  Denies rectal pain or change in bowel habits.    Denies any dysphagia, odynophagia, abdominal pain, nausea, vomiting or melena.  She is s/p hemorrhoidal band ligation X3 in 2020.  She did not have any rectal bleeding until this recent episode.    Reviewed labs from PMD, normal hemoglobin and hematocrit.    Colonoscopy December 2019: - One 2 mm polyp in the sigmoid colon, removed with a cold biopsy forceps. Resected and retrieved. - Diverticulosis in the sigmoid colon, in the descending colon, in the transverse colon and in the ascending colon. - Non-bleeding internal hemorrhoids   She had colonoscopy in 2011 at [facility] with no polyps, small internal hemorrhoids, report is not available during this office visit   Maternal uncle had colon cancer and paternal aunt has history of IBD    Outpatient Encounter Medications as of 01/24/2021  Medication Sig   albuterol (VENTOLIN HFA) 108 (90 Base) MCG/ACT inhaler Inhale 2 puffs into the lungs every 6 (six) hours as needed for wheezing or shortness of breath.   allopurinol (ZYLOPRIM) 100 MG tablet Take 1 tablet (100 mg total) by mouth daily as needed (gout attacks).   blood glucose meter kit and supplies KIT Dispense based on patient and insurance preference. Use up to three times daily as directed.   carvedilol (COREG) 25 MG tablet Take 1 tablet (25 mg total) by mouth  2 (two) times daily.   cholecalciferol (VITAMIN D) 25 MCG (1000 UNIT) tablet Take 1,000 Units by mouth at bedtime.    Cyanocobalamin ([employer] B12 GUMMIES PO) Take 1,000 mcg by mouth daily.   famotidine (PEPCID) 40 MG tablet Take 40 mg by mouth every evening.   FARXIGA 10 MG TABS tablet TAKE 1 TABLET BY MOUTH  DAILY BEFORE BREAKFAST   fluticasone-salmeterol (ADVAIR) 250-50 MCG/ACT AEPB Inhale 1 puff into the lungs in the morning and at bedtime.   glucose blood (ONETOUCH ULTRA) test strip Use to check blood sugar 4-5 times daily   ipratropium-albuterol (DUONEB) 0.5-2.5 (3) MG/3ML SOLN Take 3 mLs by nebulization every 6 (six) hours as needed (shortness of breath/wheezing).   levocetirizine (XYZAL) 5 MG tablet Take 5 mg by mouth daily.   magnesium oxide (MAG-OX) 400 MG tablet Take 400 mg by mouth as needed.   montelukast (SINGULAIR) 10 MG tablet TAKE 1 TABLET BY MOUTH  DAILY   omeprazole (PRILOSEC) 40 MG capsule Take 1 capsule (40 mg total) by mouth daily.   potassium chloride 20 MEQ/15ML (10%) SOLN Take 15 mLs (20 mEq total) by mouth daily as needed (with each dose of torsemide).   sacubitril-valsartan (ENTRESTO) 49-51 MG Take 1 tablet by mouth 2 (two) times daily.   torsemide (DEMADEX) 20 MG tablet Take 1 tablet (20 mg total) by mouth daily as needed (swelling/fluid/weight gain).   No facility-administered encounter medications on file as of 01/24/2021.  Allergies as of 01/24/2021 - Review Complete 01/24/2021  Allergen Reaction Noted   Strawberry (diagnostic) Anaphylaxis and Other (See Comments) 03/07/2019   Penicillins Itching    Tape Other (See Comments) 07/11/2014   Gabapentin Itching, Rash, and Other (See Comments) 04/07/2018    Past Medical History:  Diagnosis Date   Asthma    Blood in stool    Colon polyps    Diabetes mellitus without complication (HCC)    Diverticulitis    Fibromyalgia    GERD (gastroesophageal reflux disease)    Gout    Hypercholesteremia    Hypertension     Pneumonia    Pneumonia    UTI (urinary tract infection)     Past Surgical History:  Procedure Laterality Date   APPENDECTOMY     CESAREAN SECTION     x 3   LIPOMA EXCISION     MENISCUS REPAIR Left    OVARIAN CYST REMOVAL     OVARIAN CYST SURGERY      Family History  Problem Relation Age of Onset   Heart failure Mother    Gout Mother    Hypertension Mother    Diabetes Mother    Heart failure Father    Hypertension Father    AAA (abdominal aortic aneurysm) Father    Gout Brother    Hypertension Brother    Heart attack Brother    Colon cancer Maternal Uncle    Crohn's disease Paternal Aunt    Breast cancer Neg Hx    Esophageal cancer Neg Hx    Pancreatic cancer Neg Hx    Stomach cancer Neg Hx     Social History   Socioeconomic History   Marital status: Legally Separated    Spouse name: Not on file   Number of children: 3   Years of education: Not on file   Highest education level: Not on file  Occupational History   Occupation: Pharmacist, hospital  Tobacco Use   Smoking status: Former    Packs/day: 0.25    Years: 9.00    Pack years: 2.25    Types: Cigarettes    Quit date: 05/26/1976    Years since quitting: 44.6   Smokeless tobacco: Never  Vaping Use   Vaping Use: Never used  Substance and Sexual Activity   Alcohol use: Never   Drug use: Never   Sexual activity: Yes  Other Topics Concern   Not on file  Social History Narrative   Not on file   Social Determinants of Health   Financial Resource Strain: Low Risk    Difficulty of Paying Living Expenses: Not hard at all  Food Insecurity: No Food Insecurity   Worried About Charity fundraiser in the Last Year: Never true   Arboriculturist in the Last Year: Never true  Transportation Needs: No Transportation Needs   Lack of Transportation (Medical): No   Lack of Transportation (Non-Medical): No  Physical Activity: Insufficiently Active   Days of Exercise per Week: 3 days   Minutes of Exercise per Session: 30 min   Stress: No Stress Concern Present   Feeling of Stress : Not at all  Social Connections: Socially Isolated   Frequency of Communication with Friends and Family: More than three times a week   Frequency of Social Gatherings with Friends and Family: More than three times a week   Attends Religious Services: Never   Marine scientist or Organizations: No   Attends Archivist Meetings: Never  Marital Status: Separated  Intimate Partner Violence: Not At Risk   Fear of Current or Ex-Partner: No   Emotionally Abused: No   Physically Abused: No   Sexually Abused: No      Review of systems: All other review of systems negative except as mentioned in the HPI.   Physical Exam: Vitals:   01/24/21 0827  BP: (!) 154/82  Pulse: 62  SpO2: 98%   Body mass index is 42.36 kg/m. Gen:      No acute distress HEENT:  sclera anicteric Abd:      soft, non-tender; no palpable masses, no distension Ext:    No edema Neuro: alert and oriented x 3 Psych: normal mood and affect  Data Reviewed:  Reviewed labs, radiology imaging, old records and pertinent past GI work up   Assessment and Plan/Recommendations:  70 year old female with history of diabetes, chronic GERD, hypertension, obesity, asthma, fibromyalgia, chronic irritable bowel syndrome here with complaints of intermittent bright red blood per rectum. Likely etiology of rectal bleeding is small volume hemorrhage from internal hemorrhoids   PROCEDURE NOTE: The patient presents with symptomatic grade 2  hemorrhoids, requesting rubber band ligation of his/her hemorrhoidal disease.  All risks, benefits and alternative forms of therapy were described and informed consent was obtained.  In the Left Lateral Decubitus position anoscopic examination revealed grade 2 hemorrhoids in the right anterior and right posterior position(s).  The anorectum was pre-medicated with 0.125% nitroglycerin and RectiCare The decision was made to  band the right posterior internal hemorrhoid, and the [place]alli was used to perform band ligation without complication.  Digital anorectal examination was then performed to assure proper positioning of the band, and to adjust the banded tissue as required.  The patient was discharged home without pain or other issues.  Dietary and behavioral recommendations were given and along with follow-up instructions.     The following adjunctive treatments were recommended:  Avoid excessive straining Use Benefiber 1 tablespoon 2-3 times daily with meals MiraLAX daily as needed to have 1-2 soft bowel movements daily  The patient will return for  follow-up and possible additional banding as needed. No complications were encountered and the patient tolerated the procedure well.   The patient was provided an opportunity to ask questions and all were answered. The patient agreed with the plan and demonstrated an understanding of the instructions.  Damaris Hippo , MD    CC: Billie Ruddy, MD

## 2021-01-24 NOTE — Patient Instructions (Signed)
HEMORRHOID BANDING PROCEDURE    FOLLOW-UP CARE   The procedure you have had should have been relatively painless since the banding of the area involved does not have nerve endings and there is no pain sensation.  The rubber band cuts off the blood supply to the hemorrhoid and the band may fall off as soon as 48 hours after the banding (the band may occasionally be seen in the toilet bowl following a bowel movement). You may notice a temporary feeling of fullness in the rectum which should respond adequately to plain Tylenol or Motrin.  Following the banding, avoid strenuous exercise that evening and resume full activity the next day.  A sitz bath (soaking in a warm tub) or bidet is soothing, and can be useful for cleansing the area after bowel movements.     To avoid constipation, take two tablespoons of natural wheat bran, natural oat bran, flax, Benefiber or any over the counter fiber supplement and increase your water intake to 7-8 glasses daily.    Unless you have been prescribed anorectal medication, do not put anything inside your rectum for two weeks: No suppositories, enemas, fingers, etc.  Occasionally, you may have more bleeding than usual after the banding procedure.  This is often from the untreated hemorrhoids rather than the treated one.  Don't be concerned if there is a tablespoon or so of blood.  If there is more blood than this, lie flat with your bottom higher than your head and apply an ice pack to the area. If the bleeding does not stop within a half an hour or if you feel faint, call our office at [phone] or go to the emergency room.  Problems are not common; however, if there is a substantial amount of bleeding, severe pain, chills, fever or difficulty passing urine (very rare) or other problems, you should call us at [phone] or report to the nearest emergency room.  Do not stay seated continuously for more than 2-3 hours for a day or two after the procedure.   Tighten your buttock muscles 10-15 times every two hours and take 10-15 deep breaths every 1-2 hours.  Do not spend more than a few minutes on the toilet if you cannot empty your bowel; instead re-visit the toilet at a later time.   Use Anusol cream twice a day on the end of your OTC suppositories and insert it into the rectum   Take Metamucil/benefiber 1 tablespoon daily  Use Miralax 1 capful twice daily As needed  I appreciate the  opportunity to care for you  Thank You   Harl Bowie , MD

## 2021-02-12 DIAGNOSIS — H2511 Age-related nuclear cataract, right eye: Secondary | ICD-10-CM | POA: Diagnosis not present

## 2021-02-12 DIAGNOSIS — H2513 Age-related nuclear cataract, bilateral: Secondary | ICD-10-CM | POA: Diagnosis not present

## 2021-02-12 DIAGNOSIS — H25013 Cortical age-related cataract, bilateral: Secondary | ICD-10-CM | POA: Diagnosis not present

## 2021-02-12 DIAGNOSIS — H25043 Posterior subcapsular polar age-related cataract, bilateral: Secondary | ICD-10-CM | POA: Diagnosis not present

## 2021-02-12 DIAGNOSIS — H18413 Arcus senilis, bilateral: Secondary | ICD-10-CM | POA: Diagnosis not present

## 2021-02-15 ENCOUNTER — Telehealth: Payer: Self-pay | Admitting: Family Medicine

## 2021-02-15 NOTE — Telephone Encounter (Signed)
Called pt, line was busy.

## 2021-02-15 NOTE — Telephone Encounter (Signed)
PT called to request a call with Dr.Banks to see if Dr.Banks would rewrite her Gout Medication that she takes. Please advise.

## 2021-02-18 NOTE — Telephone Encounter (Signed)
PT called to advise that she was able to get the medication that her previous Dr had called in for her flareup of Gout and will for sure in the future schedule a apt for the Gout. She thanks you for your time.

## 2021-02-18 NOTE — Telephone Encounter (Signed)
Left vm informing pt she would need to see pcp for her gout, because she has not seen pcp for this.

## 2021-02-20 ENCOUNTER — Telehealth: Payer: Self-pay

## 2021-02-20 NOTE — Telephone Encounter (Signed)
   Patient Name: Beth Macias  DOB: 06-Feb-1951 MRN: [id number]  Primary Cardiologist: Jenkins Rouge, MD  Chart reviewed as part of pre-operative protocol coverage.   Simple dental extractions are considered low risk procedures per guidelines and generally do not require any specific cardiac clearance. It is also generally accepted that for simple extractions and dental cleanings, there is no need to interrupt blood thinner therapy.  SBE prophylaxis is not required for the patient from a cardiac standpoint.  I will route this recommendation to the requesting party via Epic fax function and remove from pre-op pool.  Please call with questions.  Abigail Butts, PA-C 02/20/2021, 10:14 PM

## 2021-02-20 NOTE — Telephone Encounter (Signed)
   [facility] Pre-operative Risk Assessment    Patient Name: BREEZY HERTENSTEIN  DOB: December 06, 1950 MRN: [id number]  HEARTCARE STAFF:  - IMPORTANT!!!!!! Under Visit Info/Reason for Call, type in Other and utilize the format Clearance MM/DD/YY or Clearance TBD. Do not use dashes or single digits. - Please review there is not already an duplicate clearance open for this procedure. - If request is for dental extraction, please clarify the # of teeth to be extracted. - If the patient is currently at the dentist's office, call Pre-Op Callback Staff (MA/nurse) to input urgent request.  - If the patient is not currently in the dentist office, please route to the Pre-Op pool.  Request for surgical clearance:  What type of surgery is being performed? 1 EXTRACTION  When is this surgery scheduled? TBD  What type of clearance is required (medical clearance vs. Pharmacy clearance to hold med vs. Both)? MEDICAL  Are there any medications that need to be held prior to surgery and how long? NONE LISTED  Practice name and name of physician performing surgery? [facility]; DR. [place] [name]  What is the office phone number? [phone]   7.   What is the office fax number? [phone]  8.   Anesthesia type (None, local, MAC, general) ? LOCAL   Jacinta Shoe 02/20/2021, 9:52 AM  _________________________________________________________________   (provider comments below)

## 2021-02-21 ENCOUNTER — Other Ambulatory Visit: Payer: Self-pay | Admitting: Podiatry

## 2021-02-21 DIAGNOSIS — L501 Idiopathic urticaria: Secondary | ICD-10-CM | POA: Diagnosis not present

## 2021-02-21 DIAGNOSIS — J3089 Other allergic rhinitis: Secondary | ICD-10-CM | POA: Diagnosis not present

## 2021-02-21 DIAGNOSIS — J301 Allergic rhinitis due to pollen: Secondary | ICD-10-CM | POA: Diagnosis not present

## 2021-02-21 DIAGNOSIS — J4530 Mild persistent asthma, uncomplicated: Secondary | ICD-10-CM | POA: Diagnosis not present

## 2021-02-22 NOTE — Telephone Encounter (Signed)
Please Advise

## 2021-02-25 ENCOUNTER — Other Ambulatory Visit: Payer: Self-pay

## 2021-02-25 ENCOUNTER — Ambulatory Visit (HOSPITAL_COMMUNITY): Payer: Medicare Other | Attending: Cardiovascular Disease

## 2021-02-25 DIAGNOSIS — I420 Dilated cardiomyopathy: Secondary | ICD-10-CM

## 2021-02-25 LAB — ECHOCARDIOGRAM COMPLETE
Area-P 1/2: 3.37 cm2
S' Lateral: 2.9 cm

## 2021-02-27 ENCOUNTER — Telehealth: Payer: Self-pay

## 2021-02-27 DIAGNOSIS — E859 Amyloidosis, unspecified: Secondary | ICD-10-CM

## 2021-02-27 DIAGNOSIS — I3139 Other pericardial effusion (noninflammatory): Secondary | ICD-10-CM

## 2021-02-27 NOTE — Telephone Encounter (Addendum)
-----   Message from Josue Hector, MD sent at 02/25/2021  5:30 PM EDT ----- See previous message about MRI for amyloid Also check urine/serum light chain immuno phoresis (IFE)  SPEP and UPEP.  To r/o amyloid Make sure order correct test for IFE     Echo results- LV is thickened may be from HTN but persistent effusion Schedule cardiac MRI with contrast to r/o Amyloid Put in comment Dr Gardiner Rhyme to read

## 2021-03-20 NOTE — Telephone Encounter (Signed)
Patient will get lab work next week at [facility]. Patient will have MRI on 04/03/21.

## 2021-03-27 DIAGNOSIS — E859 Amyloidosis, unspecified: Secondary | ICD-10-CM | POA: Diagnosis not present

## 2021-03-27 DIAGNOSIS — I3139 Other pericardial effusion (noninflammatory): Secondary | ICD-10-CM | POA: Diagnosis not present

## 2021-03-29 DIAGNOSIS — I3139 Other pericardial effusion (noninflammatory): Secondary | ICD-10-CM | POA: Diagnosis not present

## 2021-03-29 DIAGNOSIS — E859 Amyloidosis, unspecified: Secondary | ICD-10-CM | POA: Diagnosis not present

## 2021-04-01 ENCOUNTER — Other Ambulatory Visit (HOSPITAL_COMMUNITY): Payer: Self-pay | Admitting: Emergency Medicine

## 2021-04-01 DIAGNOSIS — E859 Amyloidosis, unspecified: Secondary | ICD-10-CM

## 2021-04-01 LAB — PROTEIN ELECTROPHORESIS, SERUM
A/G Ratio: 1.3 (ref 0.7–1.7)
Albumin ELP: 3.4 g/dL (ref 2.9–4.4)
Alpha 1: 0.2 g/dL (ref 0.0–0.4)
Alpha 2: 0.6 g/dL (ref 0.4–1.0)
Beta: 1 g/dL (ref 0.7–1.3)
Gamma Globulin: 0.8 g/dL (ref 0.4–1.8)
Globulin, Total: 2.6 g/dL (ref 2.2–3.9)
Total Protein: 6 g/dL (ref 6.0–8.5)

## 2021-04-01 NOTE — Progress Notes (Signed)
H&H ordered for CMRI  Marchia Bond RN Navigator Cardiac Imaging [facility] [phone] Office  [phone] Cell

## 2021-04-02 ENCOUNTER — Telehealth: Payer: Self-pay | Admitting: Cardiovascular Disease

## 2021-04-02 ENCOUNTER — Telehealth (HOSPITAL_COMMUNITY): Payer: Self-pay | Admitting: Emergency Medicine

## 2021-04-02 LAB — UPEP/UIFE/LIGHT CHAINS/TP, 24-HR UR
% BETA, Urine: 31.3 %
ALBUMIN, U: 40.8 %
ALPHA 1 URINE: 4 %
ALPHA-2-GLOBULIN, U: 10.1 %
Free Kappa Lt Chains,Ur: 28.25 mg/L (ref 1.17–86.46)
Free Lambda Lt Chains,Ur: 7.83 mg/L (ref 0.27–15.21)
GAMMA GLOBULIN URINE: 13.8 %
Kappa/Lambda Ratio,U: 3.61 (ref 1.83–14.26)
Protein, 24H Urine: 208 mg/24 hr — ABNORMAL HIGH (ref 30–150)
Protein, Ur: 20.8 mg/dL

## 2021-04-02 NOTE — Telephone Encounter (Signed)
Left message for patient to call back. Will let her know that test will be changed to another test that can be done at our [facility] office, that is quicker and she will not need premedications for.   Per Dr. Johnsie Cancel, Have her get a Tc pyrophosphate scan for amyloid instead its a quicker scan and no MRI.

## 2021-04-02 NOTE — Telephone Encounter (Signed)
   Pt said she will drop off her pt assistance form to be sign by Dr. Johnsie Cancel tomorrow.

## 2021-04-02 NOTE — Telephone Encounter (Signed)
Calling patient to review CMR instructions however patient is claustro and wishes to cancel appt. Pt states she was not prepared to be in the MR machine for 1 hr.   Will notify ordering MD.   Marchia Bond RN Navigator Cardiac Imaging [facility] [phone] Office  [phone] Cell

## 2021-04-03 ENCOUNTER — Other Ambulatory Visit (HOSPITAL_COMMUNITY): Payer: Self-pay | Admitting: Cardiovascular Disease

## 2021-04-03 ENCOUNTER — Ambulatory Visit (HOSPITAL_COMMUNITY): Admission: RE | Admit: 2021-04-03 | Payer: Medicare Other | Source: Ambulatory Visit

## 2021-04-03 DIAGNOSIS — I5022 Chronic systolic (congestive) heart failure: Secondary | ICD-10-CM

## 2021-04-03 DIAGNOSIS — I420 Dilated cardiomyopathy: Secondary | ICD-10-CM

## 2021-04-03 NOTE — Telephone Encounter (Signed)
Called patient again this morning to explain the changes. Patient stated right now she cannot see well to drive and she will be having cataract eye surgery on November 18th and December 2nd. Patient stated all this came up out of the blue and it has her stressed. Patient stated she needs time to get through the eye surgeries and recover, then she will schedule test. Patient stated it would be towards the end of December and beginning of January before she can have test done. Informed patient to call scheduler and she will get test scheduled for patient when she calls.

## 2021-04-05 ENCOUNTER — Ambulatory Visit: Payer: Medicare Other

## 2021-04-26 DIAGNOSIS — H2512 Age-related nuclear cataract, left eye: Secondary | ICD-10-CM | POA: Diagnosis not present

## 2021-04-26 DIAGNOSIS — H2511 Age-related nuclear cataract, right eye: Secondary | ICD-10-CM | POA: Diagnosis not present

## 2021-05-06 ENCOUNTER — Other Ambulatory Visit: Payer: Self-pay

## 2021-05-06 MED ORDER — ENTRESTO 49-51 MG PO TABS: 1.0000 | ORAL_TABLET | Freq: Two times a day (BID) | ORAL | 3 refills | Status: DC

## 2021-05-06 NOTE — Telephone Encounter (Signed)
Pt's provider portion has been completed, signed and faxed to [employer]. Pt sent in her part of the application.

## 2021-05-14 ENCOUNTER — Other Ambulatory Visit: Payer: Self-pay | Admitting: Podiatry

## 2021-05-14 NOTE — Telephone Encounter (Signed)
Please call to schedule for f/u

## 2021-05-14 NOTE — Telephone Encounter (Signed)
Patient has not been seen in office since 2021,may need a medication f/u appointment. Please advise/ schedule appointment.

## 2021-05-31 DIAGNOSIS — H2512 Age-related nuclear cataract, left eye: Secondary | ICD-10-CM | POA: Diagnosis not present

## 2021-06-03 ENCOUNTER — Other Ambulatory Visit: Payer: Self-pay

## 2021-06-03 MED ORDER — TORSEMIDE 20 MG PO TABS: 20.0000 mg | ORAL_TABLET | Freq: Every day | ORAL | 1 refills | Status: DC | PRN

## 2021-06-03 MED ORDER — CARVEDILOL 25 MG PO TABS: 25.0000 mg | ORAL_TABLET | Freq: Two times a day (BID) | ORAL | 1 refills | Status: DC

## 2021-06-03 MED ORDER — ENTRESTO 49-51 MG PO TABS: 1.0000 | ORAL_TABLET | Freq: Two times a day (BID) | ORAL | 1 refills | Status: DC

## 2021-06-03 MED ORDER — DAPAGLIFLOZIN PROPANEDIOL 10 MG PO TABS: 10.0000 mg | ORAL_TABLET | Freq: Every day | ORAL | 5 refills | Status: DC

## 2021-06-03 MED ORDER — DAPAGLIFLOZIN PROPANEDIOL 10 MG PO TABS: 10.0000 mg | ORAL_TABLET | Freq: Every day | ORAL | 1 refills | Status: DC

## 2021-06-03 MED ORDER — ENTRESTO 49-51 MG PO TABS: 1.0000 | ORAL_TABLET | Freq: Two times a day (BID) | ORAL | 3 refills | Status: DC

## 2021-06-04 NOTE — Telephone Encounter (Signed)
**Note De-Identified Gurveer Colucci Obfuscation** Letter received Beth Macias fax from [place] stating that they have approved the pt for asst with Entresto until 05/25/2022. Pt ID: [id number]  The letter states that they have notified the pt of this approval as well.

## 2021-06-21 ENCOUNTER — Telehealth: Payer: Self-pay | Admitting: Family Medicine

## 2021-06-21 NOTE — Telephone Encounter (Signed)
Patient is requesting to speak with someone regarding her dapagliflozin propanediol (FARXIGA) 10 MG TABS tablet [id number] medication. Patient stated that she has stopped taking the medication to see if symptoms she was experiencing while taking the medication would stop and the symptoms did stop.  Patient could be contacted at [phone].  Please advise.

## 2021-06-21 NOTE — Telephone Encounter (Signed)
Last Ov 11/21/20 Please advise

## 2021-06-21 NOTE — Telephone Encounter (Signed)
Please have patient schedule an appointment.

## 2021-06-24 NOTE — Telephone Encounter (Signed)
I called and spoke with patient. Telephone visit set up for Friday at 4pm. Patient keeps her grand baby during the day, so unable to come into the office.

## 2021-06-28 ENCOUNTER — Encounter: Payer: Self-pay | Admitting: Family Medicine

## 2021-06-28 ENCOUNTER — Telehealth (INDEPENDENT_AMBULATORY_CARE_PROVIDER_SITE_OTHER): Payer: Medicare (Managed Care) | Admitting: Family Medicine

## 2021-06-28 DIAGNOSIS — E119 Type 2 diabetes mellitus without complications: Secondary | ICD-10-CM | POA: Diagnosis not present

## 2021-06-28 DIAGNOSIS — I5022 Chronic systolic (congestive) heart failure: Secondary | ICD-10-CM | POA: Diagnosis not present

## 2021-06-28 NOTE — Progress Notes (Signed)
Virtual Visit via Telephone Note  I connected with Beth Macias on 06/28/21 at  4:00 PM EST by telephone and verified that I am speaking with the correct person using two identifiers.   I discussed the limitations, risks, security and privacy concerns of performing an evaluation and management service by telephone and the availability of in person appointments. I also discussed with the patient that there may be a patient responsible charge related to this service. The patient expressed understanding and agreed to proceed.  Location patient: home Location provider: work or home office Participants present for the call: patient, provider Patient did not have a visit in the prior 7 days to address this/these issue(s).   History of Present Illness: Pt is a 71 yo female with pmh sig for HFpER, HTN, asthma, GERD, DM 2, allergies, vitamin D def who was seen for ongoing concern.  Pt  was started on Farxiga at time of hospital d/c 6/4-10/29/21.  Pt did not understand why she was put on the med but took it from June 2022 to Jan 2023.  She stopped the med 06/16/21 as she was having numerous side effects including HAs, weakness, feeling jittery, irritable, stuffy nose, rhinorrhea, nocturia 5 x/night, decreased sleep, increased hunger, changes in memory, and yeast infections.  Since stopping medication fsbs has been 131-137 and pt notes improvement in previous symptoms.  Pt was unaware of the possible s/e from [place] as she was never given medication information sheet when she was initially sent the medicine through her mail order pharmacy.  It was not until her insurance changed recently that she was sent a medication information sheet.  Pt was previously on Metformin.  Pt states she was not digesting pills well as she would see them in the toilet.    Pt started taking memory vitamins in addition to her regular vitamins.   Observations/Objective: Patient sounds cheerful and well on the phone. I do not  appreciate any SOB. Speech and thought processing are grossly intact. Patient reported vitals:  Assessment and Plan: Type 2 diabetes mellitus without complication, without long-term current use of insulin ([place])  -agreed with d/c'ing farxiga given numerous side effects -will control with diet. -Hgb A1C 7.0% on 09/24/20 - Plan: Ambulatory referral to diabetic education  Chronic systolic CHF (congestive heart failure) ([place])  -d/c farxiga due to side effects. -continue current medications including Entresto 49-51 mg BID, Coreg 25 mg BID, torsemide 20 mg prn - Plan: Ambulatory referral to diabetic education   Follow Up Instructions: F/u in a few wks for CPE and labs.   99441 5-10 99442 11-20 9443 21-30 I did not refer this patient for an OV in the next 24 hours for this/these issue(s).  I discussed the assessment and treatment plan with the patient. The patient was provided an opportunity to ask questions and all were answered. The patient agreed with the plan and demonstrated an understanding of the instructions.   The patient was advised to call back or seek an in-person evaluation if the symptoms worsen or if the condition fails to improve as anticipated.  I provided 18 minutes of non-face-to-face time during this encounter.   Billie Ruddy, MD

## 2021-07-12 ENCOUNTER — Encounter: Payer: Self-pay | Admitting: Family Medicine

## 2021-07-12 ENCOUNTER — Other Ambulatory Visit: Payer: Self-pay | Admitting: Family Medicine

## 2021-07-12 ENCOUNTER — Ambulatory Visit (INDEPENDENT_AMBULATORY_CARE_PROVIDER_SITE_OTHER): Payer: Medicare (Managed Care) | Admitting: Family Medicine

## 2021-07-12 ENCOUNTER — Other Ambulatory Visit: Payer: Medicare (Managed Care)

## 2021-07-12 VITALS — BP 130/70 | HR 55 | Temp 98.9°F | Ht 61.0 in | Wt 222.6 lb

## 2021-07-12 DIAGNOSIS — Z0000 Encounter for general adult medical examination without abnormal findings: Secondary | ICD-10-CM

## 2021-07-12 DIAGNOSIS — M25512 Pain in left shoulder: Secondary | ICD-10-CM | POA: Diagnosis not present

## 2021-07-12 DIAGNOSIS — I10 Essential (primary) hypertension: Secondary | ICD-10-CM

## 2021-07-12 DIAGNOSIS — M65312 Trigger thumb, left thumb: Secondary | ICD-10-CM

## 2021-07-12 DIAGNOSIS — Z23 Encounter for immunization: Secondary | ICD-10-CM | POA: Diagnosis not present

## 2021-07-12 DIAGNOSIS — E669 Obesity, unspecified: Secondary | ICD-10-CM

## 2021-07-12 DIAGNOSIS — M25511 Pain in right shoulder: Secondary | ICD-10-CM

## 2021-07-12 DIAGNOSIS — I5032 Chronic diastolic (congestive) heart failure: Secondary | ICD-10-CM | POA: Diagnosis not present

## 2021-07-12 DIAGNOSIS — G8929 Other chronic pain: Secondary | ICD-10-CM | POA: Diagnosis not present

## 2021-07-12 DIAGNOSIS — E782 Mixed hyperlipidemia: Secondary | ICD-10-CM | POA: Diagnosis not present

## 2021-07-12 DIAGNOSIS — E1169 Type 2 diabetes mellitus with other specified complication: Secondary | ICD-10-CM | POA: Diagnosis not present

## 2021-07-12 DIAGNOSIS — E876 Hypokalemia: Secondary | ICD-10-CM

## 2021-07-12 DIAGNOSIS — Z1159 Encounter for screening for other viral diseases: Secondary | ICD-10-CM

## 2021-07-12 DIAGNOSIS — D171 Benign lipomatous neoplasm of skin and subcutaneous tissue of trunk: Secondary | ICD-10-CM

## 2021-07-12 DIAGNOSIS — M65311 Trigger thumb, right thumb: Secondary | ICD-10-CM | POA: Diagnosis not present

## 2021-07-12 DIAGNOSIS — Z780 Asymptomatic menopausal state: Secondary | ICD-10-CM

## 2021-07-12 LAB — LIPID PANEL
Cholesterol: 213 mg/dL — ABNORMAL HIGH (ref 0–200)
HDL: 59.5 mg/dL (ref >39.00)
LDL Cholesterol: 127 mg/dL — ABNORMAL HIGH (ref 0–99)
NonHDL: 153.83
Total CHOL/HDL Ratio: 4
Triglycerides: 135 mg/dL (ref 0.0–149.0)
VLDL: 27 mg/dL (ref 0.0–40.0)

## 2021-07-12 LAB — COMPREHENSIVE METABOLIC PANEL
ALT: 10 U/L (ref 0–35)
AST: 15 U/L (ref 0–37)
Albumin: 4.3 g/dL (ref 3.5–5.2)
Alkaline Phosphatase: 61 U/L (ref 39–117)
BUN: 21 mg/dL (ref 6–23)
CO2: 36 mEq/L — ABNORMAL HIGH (ref 19–32)
Calcium: 10.3 mg/dL (ref 8.4–10.5)
Chloride: 101 mEq/L (ref 96–112)
Creatinine, Ser: 0.7 mg/dL (ref 0.40–1.20)
GFR: 86.87 mL/min (ref >60.00)
Glucose, Bld: 130 mg/dL — ABNORMAL HIGH (ref 70–99)
Potassium: 3.4 mEq/L — ABNORMAL LOW (ref 3.5–5.1)
Sodium: 141 mEq/L (ref 135–145)
Total Bilirubin: 1 mg/dL (ref 0.2–1.2)
Total Protein: 6.9 g/dL (ref 6.0–8.3)

## 2021-07-12 LAB — CBC WITH DIFFERENTIAL/PLATELET
Basophils Absolute: 0 10*3/uL (ref 0.0–0.1)
Basophils Relative: 0.9 % (ref 0.0–3.0)
Eosinophils Absolute: 0.3 10*3/uL (ref 0.0–0.7)
Eosinophils Relative: 4.8 % (ref 0.0–5.0)
HCT: 41.1 % (ref 36.0–46.0)
Hemoglobin: 13.4 g/dL (ref 12.0–15.0)
Lymphocytes Relative: 25.7 % (ref 12.0–46.0)
Lymphs Abs: 1.4 10*3/uL (ref 0.7–4.0)
MCHC: 32.7 g/dL (ref 30.0–36.0)
MCV: 85.4 fl (ref 78.0–100.0)
Monocytes Absolute: 0.4 10*3/uL (ref 0.1–1.0)
Monocytes Relative: 6.7 % (ref 3.0–12.0)
Neutro Abs: 3.5 10*3/uL (ref 1.4–7.7)
Neutrophils Relative %: 61.9 % (ref 43.0–77.0)
Platelets: 237 10*3/uL (ref 150.0–400.0)
RBC: 4.82 Mil/uL (ref 3.87–5.11)
RDW: 14.7 % (ref 11.5–15.5)
WBC: 5.6 10*3/uL (ref 4.0–10.5)

## 2021-07-12 LAB — T4, FREE: Free T4: 0.77 ng/dL (ref 0.60–1.60)

## 2021-07-12 LAB — TSH: TSH: 0.5 u[IU]/mL (ref 0.35–5.50)

## 2021-07-12 LAB — HEMOGLOBIN A1C: Hgb A1c MFr Bld: 6.8 % — ABNORMAL HIGH (ref 4.6–6.5)

## 2021-07-12 MED ORDER — POTASSIUM CHLORIDE 20 MEQ/15ML (10%) PO SOLN: 20.0000 meq | Freq: Every day | ORAL | 0 refills | Status: DC | PRN

## 2021-07-12 NOTE — Progress Notes (Signed)
Subjective:     Beth Macias is a 71 y.o. female and is here for a comprehensive physical exam. The patient reports doing well for the most part aside from aches and pains.  Seeing Ortho for shoulder pain and trigger finger of b/l thumbs.  Pt states she has been feeling much better since d/c farxiga on 06/16/21.  No longer having Has, weakness, rhinorrhea, jitteriness, nasal congestion, nocturia, yeast infections, memory changes.  Wilder Glade was started during hospitalization summer 2022 due to pt's h/o CHF.  Denies LE edema, SOB.  Had bilateral cataracts removed in April 30, 2021 and May 31, 2021.  Pt mentions prior h/o lipoma removed from her back after this provider mentions large visible mass currently on back.  Social History   Socioeconomic History   Marital status: Legally Separated    Spouse name: Not on file   Number of children: 3   Years of education: Not on file   Highest education level: Not on file  Occupational History   Occupation: Teacher  Tobacco Use   Smoking status: Former    Packs/day: 0.25    Years: 9.00    Pack years: 2.25    Types: Cigarettes    Quit date: 05/26/1976    Years since quitting: 45.1   Smokeless tobacco: Never  Vaping Use   Vaping Use: Never used  Substance and Sexual Activity   Alcohol use: Never   Drug use: Never   Sexual activity: Yes  Other Topics Concern   Not on file  Social History Narrative   Not on file   Social Determinants of Health   Financial Resource Strain: Low Risk    Difficulty of Paying Living Expenses: Not hard at all  Food Insecurity: No Food Insecurity   Worried About Charity fundraiser in the Last Year: Never true   [place] in the Last Year: Never true  Transportation Needs: No Transportation Needs   Lack of Transportation (Medical): No   Lack of Transportation (Non-Medical): No  Physical Activity: Insufficiently Active   Days of Exercise per Week: 3 days   Minutes of Exercise per Session: 30 min   Stress: No Stress Concern Present   Feeling of Stress : Not at all  Social Connections: Socially Isolated   Frequency of Communication with Friends and Family: More than three times a week   Frequency of Social Gatherings with Friends and Family: More than three times a week   Attends Religious Services: Never   Marine scientist or Organizations: No   Attends Archivist Meetings: Never   Marital Status: Separated  Intimate Partner Violence: Not At Risk   Fear of Current or Ex-Partner: No   Emotionally Abused: No   Physically Abused: No   Sexually Abused: No   Health Maintenance  Topic Date Due   DEXA SCAN  Never done   INFLUENZA VACCINE  12/24/2020   MAMMOGRAM  02/23/2021   HEMOGLOBIN A1C  03/27/2021   FOOT EXAM  01/09/2022 (Originally 11/15/1960)   OPHTHALMOLOGY EXAM  01/09/2022 (Originally 11/22/2020)   COVID-19 Vaccine (4 - Booster for [place] series) 01/09/2022 (Originally 04/25/2020)   Hepatitis C Screening  01/09/2022 (Originally 11/15/1967)   Zoster Vaccines- Shingrix (1 of 2) 01/09/2022 (Originally 11/14/1968)   TETANUS/TDAP  05/12/2024   COLONOSCOPY (Pts 45-50yrs Insurance coverage will need to be confirmed)  04/29/2028   Pneumonia Vaccine 81+ Years old  Completed   HPV VACCINES  Aged Out  The following portions of the patient's history were reviewed and updated as appropriate: allergies, current medications, past family history, past medical history, past social history, past surgical history, and problem list.  Review of Systems Pertinent items noted in HPI and remainder of comprehensive ROS otherwise negative.   Objective:    BP 130/70 (BP Location: Right Arm, Patient Position: Sitting, Cuff Size: Normal)    Pulse (!) 55    Temp 98.9 F (37.2 C) (Oral)    Ht 5\' 1"  (1.549 m)    Wt 222 lb 9.6 oz (101 kg)    SpO2 98%    BMI 42.06 kg/m  General appearance: alert, cooperative, and no distress Head: Normocephalic, without obvious abnormality,  atraumatic Eyes: conjunctivae/corneas clear. PERRL, EOM's intact. Fundi benign. Ears: normal TM's and external ear canals both ears Nose: Nares normal. Septum midline. Mucosa normal. No drainage or sinus tenderness. Throat: lips, mucosa, and tongue normal; teeth and gums normal Neck: no adenopathy, no carotid bruit, no JVD, supple, symmetrical, trachea midline, and thyroid not enlarged, symmetric, no tenderness/mass/nodules Lungs: clear to auscultation bilaterally Heart: regular rate and rhythm, S1, S2 normal, no murmur, click, rub or gallop Abdomen: soft, non-tender; bowel sounds normal; no masses,  no organomegaly Extremities: extremities normal, atraumatic, no cyanosis or edema Pulses: 2+ and symmetric Skin: Skin color, texture, turgor normal. No rashes or lesions or a large 7.5 cm x 4.5 cm round, mobile mass of upper right back near midline favoring a lipoma Lymph nodes: Cervical, supraclavicular, and axillary nodes normal. Neurologic: Alert and oriented X 3, normal strength and tone. Normal symmetric reflexes. Normal coordination and gait    Assessment:    Healthy female exam with lipoma of upper R back.    Plan:    Anticipatory guidance given including wearing seatbelts, smoke detectors in the home, increasing physical activity, increasing p.o. intake of water and vegetables. -labs -mammogram scheduled 07/26/21.  COVID vaccines done 07/21/2019, 08/16/2019, and 02/29/2020.  Patient had 1 dose of Shingrix in 2015 or 2016. -colonoscopy done 04/29/2018. -Last bone density scan 2001 -Immunizations reviewed. See After Visit Summary for Counseling Recommendations   Need for influenza vaccination  - Plan: Flu Vaccine QUAD High Dose(Fluad)  Asymptomatic postmenopausal state -Last bone density scan done 2001 - Plan: DG Bone Density  Encounter for hepatitis C screening test for low risk patient  - Plan: Hep C Antibody  Essential hypertension  -Controlled -Continue current medications  including Coreg 25 mg twice daily -Lifestyle modifications - Plan: CBC with Differential/Platelet, TSH, T4, Free, CMP  Chronic heart failure with preserved ejection fraction (HCC)  -Euvolemic -Continue Coreg 25 mg twice daily, Entresto 49-51 mg twice daily, torsemide 20 mg as needed -Farxiga d/c'd 2/2 numerous side effects -Continue follow-up with cardiology - Plan: TSH, T4, Free, CMP  Diabetes mellitus type 2 in obese ([place])  -HgbA1c 7.0% on 09/24/2020 -Farxiga d/c'd 2/2 numerous side effects -will discuss alternative medications based on lab results -Patient encouraged to schedule diabetic retinopathy screen -would consider starting statin given current medical history and total cholesterol 243, LDL 141, triglycerides 162 on 10/27/2020. - Plan: TSH, T4, Free, Hemoglobin A1c, Lipid panel, CMP  Morbid obesity (HCC) -Body mass index is 42.06 kg/m. -BMI over 40 and pt with comorbid conditions including DM 2, HTN, CHF -Continue lifestyle modifications including diet modifications and increasing physical activity -Consider weight management medications such as semaglutide  Mixed hyperlipidemia  -Lifestyle modifications -For continued elevation of cholesterol consider starting a statin - Plan: TSH, T4, Free,  Lipid panel, CMP  Lipoma of back -Discussed removal options.  Patient wishes to wait at this time. -We will place referral to dermatology or plastics at patient request  Trigger finger of left thumb -Continue follow-up with Ortho  Trigger finger of right thumb -Continue follow-up with Ortho  Chronic pain of both shoulders -Continue supportive care including heat, topical analgesics, massage, etc. -Continue follow-up with Ortho  F/u as needed in the next few months for DM and HTN  Grier Mitts, MD

## 2021-07-15 LAB — HEPATITIS C ANTIBODY
Hepatitis C Ab: NONREACTIVE
SIGNAL TO CUT-OFF: <0.02 (ref <1.00)

## 2021-07-17 ENCOUNTER — Other Ambulatory Visit: Payer: Self-pay | Admitting: Cardiovascular Disease

## 2021-07-26 ENCOUNTER — Ambulatory Visit
Admission: RE | Admit: 2021-07-26 | Discharge: 2021-07-26 | Disposition: A | Discharge status: Home / Self Care | Payer: Medicare (Managed Care) | Source: Ambulatory Visit | Attending: Family Medicine | Admitting: Family Medicine

## 2021-07-26 ENCOUNTER — Other Ambulatory Visit: Payer: Self-pay

## 2021-07-26 DIAGNOSIS — Z1231 Encounter for screening mammogram for malignant neoplasm of breast: Secondary | ICD-10-CM

## 2021-07-31 ENCOUNTER — Encounter: Payer: Self-pay | Admitting: *Deleted

## 2021-08-06 ENCOUNTER — Telehealth: Payer: Self-pay | Admitting: Family Medicine

## 2021-08-06 NOTE — Progress Notes (Signed)
?  Chronic Care Management  ? ?Note ? ?08/06/2021 ?Name: LEODA SMITHHART MRN: [id number] DOB: Jan 23, 1951 ? ?POSEY JASMIN is a 71 y.o. year old female who is a primary care patient of Billie Ruddy, MD. I reached out to [employer] by phone today in response to a referral sent by Ms. Brantley Stage Cavitt's PCP, Billie Ruddy, MD.  ? ?Ms. King was given information about Chronic Care Management services today including:  ?CCM service includes personalized support from designated clinical staff supervised by her physician, including individualized plan of care and coordination with other care providers ?24/7 contact phone numbers for assistance for urgent and routine care needs. ?Service will only be billed when office clinical staff spend 20 minutes or more in a month to coordinate care. ?Only one practitioner may furnish and bill the service in a calendar month. ?The patient may stop CCM services at any time (effective at the end of the month) by phone call to the office staff. ? ? ?Patient agreed to services and verbal consent obtained.  ? ?Follow up plan: ? ? ?Tatjana Dellinger ?Upstream Scheduler  ?

## 2021-08-06 NOTE — Chronic Care Management (AMB) (Signed)
?  Chronic Care Management  ? ?Outreach Note ? ?08/06/2021 ?Name: ELVERIA LAUDERBAUGH MRN: [id number] DOB: 25-Nov-1950 ? ?Referred by: Billie Ruddy, MD ?Reason for referral : No chief complaint on file. ? ? ?An unsuccessful telephone outreach was attempted today. The patient was referred to the pharmacist for assistance with care management and care coordination.  ? ?Follow Up Plan:  ? ?Tatjana Dellinger ?Upstream Scheduler  ?

## 2021-08-15 ENCOUNTER — Telehealth: Payer: Self-pay | Admitting: Pulmonary Disease

## 2021-08-15 NOTE — Telephone Encounter (Signed)
Called patient but she did not answer. She has not been since 2020. She will need an OV before any refills are sent.  ?

## 2021-08-26 ENCOUNTER — Telehealth: Payer: Self-pay | Admitting: Pharmacist

## 2021-08-26 NOTE — Chronic Care Management (AMB) (Signed)
? ? ?Chronic Care Management ?Pharmacy Assistant  ? ?Name: Beth Macias  MRN: [id number] DOB: 02/11/1951 ? ?Beth Macias is an 71 y.o. year old female who presents for her initial CCM visit with the clinical pharmacist. ? ?Reason for Encounter: Chart prep for initial visit with [name] the Clinical Pharmacist on 08/28/2021 at 11:00 via the telephone.  ?  ?Conditions to be addressed/monitored: ?CHF, HTN, DMII, GERD, and Asthma ? ?Recent office visits:  ?07/12/2021 Grier Mitts MD - Patient was seen for well adult exam and additional issues. No medication changes. Follow up in 4 months.  ? ?06/28/2021 Grier Mitts MD - Patient was seen for Type 2 diabetes mellitus without complication, without long-term current use of insulin and additional issue. Discontinued Wilder Glade. Follow up for CPE. ? ?09/17/2020 Randel Pigg LPN - Medicare wellness exam ? ?Recent consult visits:  ?None ? ?Hospital visits:  ?None ? ?Medications: ?Outpatient Encounter Medications as of 08/26/2021  ?Medication Sig  ? albuterol (VENTOLIN HFA) 108 (90 Base) MCG/ACT inhaler Inhale 2 puffs into the lungs every 6 (six) hours as needed for wheezing or shortness of breath.  ? allopurinol (ZYLOPRIM) 100 MG tablet Take 1 tablet (100 mg total) by mouth daily as needed (gout attacks).  ? blood glucose meter kit and supplies KIT Dispense based on patient and insurance preference. Use up to three times daily as directed.  ? carvedilol (COREG) 25 MG tablet Take 1 tablet (25 mg total) by mouth 2 (two) times daily. Please schedule yearly appointment for future refills. Thank you  ? cholecalciferol (VITAMIN D) 25 MCG (1000 UNIT) tablet Take 1,000 Units by mouth at bedtime.   ? Cyanocobalamin ([employer] B12 GUMMIES PO) Take 1,000 mcg by mouth daily.  ? famotidine (PEPCID) 40 MG tablet Take 40 mg by mouth every evening.  ? fluticasone-salmeterol (ADVAIR) 250-50 MCG/ACT AEPB Inhale 1 puff into the lungs in the morning and at bedtime.  ? glucose blood  (ONETOUCH ULTRA) test strip Use to check blood sugar 4-5 times daily  ? hydrocortisone (ANUSOL-HC) 2.5 % rectal cream Place 1 application rectally 2 (two) times daily.  ? ipratropium-albuterol (DUONEB) 0.5-2.5 (3) MG/3ML SOLN Take 3 mLs by nebulization every 6 (six) hours as needed (shortness of breath/wheezing).  ? levocetirizine (XYZAL) 5 MG tablet Take 5 mg by mouth daily.  ? magnesium oxide (MAG-OX) 400 MG tablet Take 400 mg by mouth as needed.  ? montelukast (SINGULAIR) 10 MG tablet TAKE 1 TABLET BY MOUTH  DAILY  ? omeprazole (PRILOSEC) 40 MG capsule Take 1 capsule (40 mg total) by mouth daily.  ? potassium chloride 20 MEQ/15ML (10%) SOLN Take 15 mLs (20 mEq total) by mouth daily as needed (with each dose of torsemide).  ? sacubitril-valsartan (ENTRESTO) 49-51 MG Take 1 tablet by mouth 2 (two) times daily.  ? torsemide (DEMADEX) 20 MG tablet Take 1 tablet (20 mg total) by mouth daily as needed (swelling/fluid/weight gain).  ? ?No facility-administered encounter medications on file as of 08/26/2021.  ?Fill History: ?ALBUTEROL HFA INH (200 PUFFS)8.5GM 10/25/2020 16  ? ?ALLOPURINOL 100MG TABLETS 02/16/2021 90  ? ?CARVEDILOL 25 MG TABLET 06/05/2021 90  ? ?FAMOTIDINE 40 MG TABLET 07/31/2021 90  ? ?fluticasone propion-salmeterol 250-50 mcg/dose blister with device 02/12/2021  ? ?IPRATROPI/ALB 0.5/3MG INH SL 30X3ML 10/25/2020 11  ? ?LEVOCETIRIZINE 5MG TABLETS 02/09/2020 90  ? ?MONTELUKAST SODIUM  10 MG TABS 02/18/2021 90  ? ?OMEPRAZOLE  40 MG CPDR 05/02/2021 90  ? ?ENTRESTO 49 MG-51 MG TABLET 06/05/2021 90  ? ?  TORSEMIDE 20 MG TABLET 06/05/2021 90  ? ? ?Have you seen any other providers since your last visit? **Patient denies ?Any changes in your medications or health? Patient denies any changes in her medications or health  ?Any side effects from any medications? Patient denies any side effects ?Do you have an symptoms or problems not managed by your medications? Patient denies any symptoms or problems that medications  aren't helping ?Any concerns about your health right now? Patient denies any concerns at this time ?Has your provider asked that you check blood pressure, blood sugar, or follow special diet at home? Patient does check her blood sugars when she has symptoms that it could be too high or low.  Patient is trying to eat less sugars.  ?Do you get any type of exercise on a regular basis? Patient is not getting any type of exercise on a regular basis.  ?Can you think of a goal you would like to reach for your health? Patient would like to loose 50 lbs.  ?Do you have any problems getting your medications? Patient is not having any issues getting medications other than getting the refill for singulair sent in by Dr. Sood.  ?Is there anything that you would like to discuss during the appointment? Patient denies any other concerns at this time.  ? ?Please bring medications and supplements to appointment ? ?Care Gaps: ?AWV - message sent to Robin Hayes ?Last BP - 130/70 on 07/12/2021 ?Last A1C - 3.8 on 07/12/2021 ?Dexascan - never done ? ?Star Rating Drugs: ?None ? ?Janie Johnson CMA  ?Clinical Pharmacist Assistant ?[phone] ? ?

## 2021-08-27 NOTE — Progress Notes (Signed)
Chronic Care Management Pharmacy Note  08/28/2021 Name:  Beth Macias MRN:  [id number] DOB:  28-Dec-1950  Summary: A1c at goal < 7% Pt's asthma is not well controlled right now  Recommendations/Changes made from today's visit: -Recommended routine use of Dulera 2 puffs twice daily during allergy season -Recommend repeat uric acid, magnesium level, and vitamin D level -Recommend switching carvedilol to metoprolol due to SOB and uncontrolled asthma -Recommended routine BP monitoring at home -Scheduled DEXA scan  Plan: Follow up BP & asthma assessment in 1-2 months Follow up in 4 months  Subjective: Beth Macias is an 71 y.o. year old female who is a primary patient of Deeann Saint, MD.  The CCM team was consulted for assistance with disease management and care coordination needs.    Engaged with patient by telephone for initial visit in response to provider referral for pharmacy case management and/or care coordination services.   Consent to Services:  The patient was given the following information about Chronic Care Management services today, agreed to services, and gave verbal consent: 1. CCM service includes personalized support from designated clinical staff supervised by the primary care provider, including individualized plan of care and coordination with other care providers 2. 24/7 contact phone numbers for assistance for urgent and routine care needs. 3. Service will only be billed when office clinical staff spend 20 minutes or more in a month to coordinate care. 4. Only one practitioner may furnish and bill the service in a calendar month. 5.The patient may stop CCM services at any time (effective at the end of the month) by phone call to the office staff. 6. The patient will be responsible for cost sharing (co-pay) of up to 20% of the service fee (after annual deductible is met). Patient agreed to services and consent obtained.  Patient Care Team: Deeann Saint, MD as PCP - General (Family Medicine) Wendall Stade, MD as PCP - Cardiology (Cardiology) Verner Chol, [facility] as Pharmacist (Pharmacist)  Recent office visits: 07/12/2021 Abbe Amsterdam MD - Patient was seen for well adult exam and additional issues. No medication changes. Follow up in 4 months.    06/28/2021 Abbe Amsterdam MD - Patient was seen for Type 2 diabetes mellitus without complication, without long-term current use of insulin and additional issue. Discontinued Marcelline Deist. Follow up for CPE.   09/17/2020 March Rummage LPN - Medicare wellness exam  Recent consult visits: None  Hospital visits: None in previous 6 months   Objective:  Lab Results  Component Value Date   CREATININE 0.70 07/12/2021   BUN 21 07/12/2021   GFR 86.87 07/12/2021   GFRNONAA 60 (L) 11/06/2020   GFRAA 86 02/29/2020   NA 141 07/12/2021   K 3.4 (L) 07/12/2021   CALCIUM 10.3 07/12/2021   CO2 36 (H) 07/12/2021   GLUCOSE 130 (H) 07/12/2021    Lab Results  Component Value Date/Time   HGBA1C 6.8 (H) 07/12/2021 10:16 AM   HGBA1C 7.0 (H) 09/24/2020 02:01 PM   GFR 86.87 07/12/2021 10:16 AM   GFR 84.45 09/24/2020 02:01 PM    Last diabetic Eye exam:  Lab Results  Component Value Date/Time   HMDIABEYEEXA No Retinopathy 11/23/2019 12:00 AM    Last diabetic Foot exam: No results found for: HMDIABFOOTEX   Lab Results  Component Value Date   CHOL 213 (H) 07/12/2021   HDL 59.50 07/12/2021   LDLCALC 127 (H) 07/12/2021   TRIG 135.0 07/12/2021   CHOLHDL 4 07/12/2021  Latest Ref Rng & Units 07/12/2021   10:16 AM 03/27/2021    3:01 PM 02/29/2020   10:10 AM  Hepatic Function  Total Protein 6.0 - 8.3 g/dL 6.9   6.0   6.6    Albumin 3.5 - 5.2 g/dL 4.3    3.9    AST 0 - 37 U/L 15    12    ALT 0 - 35 U/L 10    12    Alk Phosphatase 39 - 117 U/L 61    79    Total Bilirubin 0.2 - 1.2 mg/dL 1.0    0.7      Lab Results  Component Value Date/Time   TSH 0.50 07/12/2021 10:16 AM   TSH 0.552  03/02/2019 10:18 AM   FREET4 0.77 07/12/2021 10:16 AM       Latest Ref Rng & Units 07/12/2021   10:16 AM 10/29/2020   12:21 AM 10/27/2020    9:59 PM  CBC  WBC 4.0 - 10.5 K/uL 5.6   6.8   7.3    Hemoglobin 12.0 - 15.0 g/dL 13.4   12.7   13.5    Hematocrit 36.0 - 46.0 % 41.1   39.7   42.6    Platelets 150.0 - 400.0 K/uL 237.0   231   268      No results found for: VD25OH  Clinical ASCVD: No  The 10-year ASCVD risk score (Arnett DK, et al., 2019) is: 30%   Values used to calculate the score:     Age: 71 years     Sex: Female     Is Non-Hispanic African American: Yes     Diabetic: Yes     Tobacco smoker: No     Systolic Blood Pressure: 161 mmHg     Is BP treated: Yes     HDL Cholesterol: 59.5 mg/dL     Total Cholesterol: 213 mg/dL       07/12/2021    8:28 AM 09/17/2020    3:07 PM 09/17/2020    2:58 PM  Depression screen PHQ 2/9  Decreased Interest 0 0 0  Down, Depressed, Hopeless 0 0 0  PHQ - 2 Score 0 0 0  Altered sleeping 0    Tired, decreased energy 3    Change in appetite 0    Feeling bad or failure about yourself  0    Trouble concentrating 0    Moving slowly or fidgety/restless 0    Suicidal thoughts 0    PHQ-9 Score 3        Social History   Tobacco Use  Smoking Status Former   Packs/day: 0.25   Years: 9.00   Pack years: 2.25   Types: Cigarettes   Quit date: 05/26/1976   Years since quitting: 45.2  Smokeless Tobacco Never   BP Readings from Last 3 Encounters:  07/12/21 130/70  01/24/21 (!) 154/82  11/30/20 (!) 180/88   Pulse Readings from Last 3 Encounters:  07/12/21 (!) 55  01/24/21 62  11/30/20 71   Wt Readings from Last 3 Encounters:  07/12/21 222 lb 9.6 oz (101 kg)  01/24/21 224 lb 3 oz (101.7 kg)  11/30/20 225 lb 1.6 oz (102.1 kg)   BMI Readings from Last 3 Encounters:  07/12/21 42.06 kg/m  01/24/21 42.36 kg/m  11/30/20 42.53 kg/m    Assessment/Interventions: Review of patient past medical history, allergies, medications, health  status, including review of consultants reports, laboratory and other test data, was performed as part  of comprehensive evaluation and provision of chronic care management services.   SDOH:  (Social Determinants of Health) assessments and interventions performed: Yes SDOH Interventions    Flowsheet Row Most Recent Value  SDOH Interventions   Financial Strain Interventions Intervention Not Indicated  Transportation Interventions Intervention Not Indicated      SDOH Screenings   Alcohol Screen: Low Risk    Last Alcohol Screening Score (AUDIT): 0  Depression (PHQ2-9): Low Risk    PHQ-2 Score: 3  Financial Resource Strain: Low Risk    Difficulty of Paying Living Expenses: Not very hard  Food Insecurity: No Food Insecurity   Worried About Charity fundraiser in the Last Year: Never true   Ran Out of Food in the Last Year: Never true  Housing: Low Risk    Last Housing Risk Score: 0  Physical Activity: Insufficiently Active   Days of Exercise per Week: 3 days   Minutes of Exercise per Session: 30 min  Social Connections: Socially Isolated   Frequency of Communication with Friends and Family: More than three times a week   Frequency of Social Gatherings with Friends and Family: More than three times a week   Attends Religious Services: Never   Marine scientist or Organizations: No   Attends Music therapist: Never   Marital Status: Separated  Stress: No Stress Concern Present   Feeling of Stress : Not at all  Tobacco Use: Medium Risk   Smoking Tobacco Use: Former   Smokeless Tobacco Use: Never   Passive Exposure: Not on file  Transportation Needs: No Transportation Needs   Lack of Transportation (Medical): No   Lack of Transportation (Non-Medical): No   Patient reports her biggest health concern right now is with her asthma. 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 Patient reports this flares up and then she is headed for the hospital and wants to avoid that. She is not taking montelukast or her maintenance inhaler regularly. She is very allergic to pollen and her allergies got significantly worse when she moved from [place] to [place] in 2017.  Patient's lives with some of her family members including her first grand daughter who she watches often. She reports this is difficult because she isn't eating the same and isn't as active as she once was. Patient is eating more frozen meals and eating out more with them around and doesn't have time for activity but really wants to get back into the pool as she enjoyed water aerobics previously.  Patient was married to a doctor, her daughter is a pediatrician and her other daughter is a PA. She reports being surrounded by medicine but gets confused on her own health/medications often.  Patient is not having current issues with her medications. Patient was having side effects with Wilder Glade and this was stopped and she never wants to consider this again.   CCM Care Plan  Allergies  Allergen Reactions   Strawberry (Diagnostic) Anaphylaxis and Other (See Comments)    hives   Farxiga [Dapagliflozin] Other (See Comments)    Had HAs, weakness, rhinorrhea, jitteriness, nasal congestion, nocturia, yeast infections, memory changes, irritability.   Penicillins Itching    Has patient had a PCN reaction causing immediate rash, facial/tongue/throat swelling, SOB or lightheadedness with hypotension: No Has patient had a PCN reaction causing severe  rash involving mucus membranes or skin necrosis: No Has patient had a PCN reaction that required hospitalization: Unknown Has patient had a PCN reaction occurring within the last 10 years: No If all of the above answers are "NO", then may proceed with Cephalosporin use.   Tape Other (See Comments)    Blisters   Gabapentin  Itching, Rash and Other (See Comments)    Incoherent    Medications Reviewed Today     Reviewed by Verner Chol, [facility] (Pharmacist) on 08/28/21 at 1239  Med List Status: <None>   Medication Order Taking? Sig Documenting Provider Last Dose Status Informant  albuterol (VENTOLIN HFA) 108 (90 Base) MCG/ACT inhaler [id number] Yes Inhale 2 puffs into the lungs every 6 (six) hours as needed for wheezing or shortness of breath. [provider] Taking Active Multiple Informants  allopurinol (ZYLOPRIM) 100 MG tablet [id number] Yes Take 1 tablet (100 mg total) by mouth daily as needed (gout attacks). Joseph Art, DO Taking Active   blood glucose meter kit and supplies KIT [id number]  Dispense based on patient and insurance preference. Use up to three times daily as directed. Deeann Saint, MD  Active   carvedilol (COREG) 25 MG tablet [id number] Yes Take 1 tablet (25 mg total) by mouth 2 (two) times daily. Please schedule yearly appointment for future refills. Thank you Wendall Stade, MD Taking Active   cholecalciferol (VITAMIN D) 25 MCG (1000 UNIT) tablet [id number] Yes Take 1,000 Units by mouth at bedtime.  [provider] Taking Active Multiple Informants  Cyanocobalamin ([facility] B12 GUMMIES PO) [id number] Yes Take 1,000 mcg by mouth daily. [provider] Taking Active   famotidine (PEPCID) 40 MG tablet [id number] Yes Take 40 mg by mouth in the morning. [provider] Taking Active Multiple Informants  glucose blood (ONETOUCH ULTRA) test strip [id number]  Use to check blood sugar 4-5 times daily Deeann Saint, MD  Active Multiple Informants  ipratropium-albuterol (DUONEB) 0.5-2.5 (3) MG/3ML SOLN [id number] No Take 3 mLs by nebulization every 6 (six) hours as needed (shortness of breath/wheezing).  Patient not taking: Reported on 08/28/2021   [provider] Not Taking Active Multiple Informants  levocetirizine (XYZAL) 5 MG tablet [id number] Yes Take 5 mg by  mouth daily. [provider] Taking Active Multiple Informants  magnesium oxide (MAG-OX) 400 MG tablet [id number] Yes Take 400 mg by mouth as needed. [provider] Taking Active Multiple Informants  mometasone-formoterol (DULERA) 100-5 MCG/ACT AERO [id number] Yes Inhale 2 puffs into the lungs 2 (two) times daily. [provider] Taking Active   montelukast (SINGULAIR) 10 MG tablet [id number] No TAKE 1 TABLET BY MOUTH  DAILY  Patient not taking: Reported on 08/28/2021   Coralyn Helling, MD Not Taking Active   Multiple Vitamins-Minerals (MULTIVITAMIN WITH MINERALS) tablet [id number] Yes Take 1 tablet by mouth daily. [provider]  Active   potassium chloride 20 MEQ/15ML (10%) SOLN [id number] Yes Take 15 mLs (20 mEq total) by mouth daily as needed (with each dose of torsemide). Deeann Saint, MD Taking Active   sacubitril-valsartan [facility]) 49-51 MG [id number] Yes Take 1 tablet by mouth 2 (two) times daily. Wendall Stade, MD Taking Active   torsemide (DEMADEX) 20 MG tablet [id number] Yes Take 1 tablet (20 mg total) by mouth daily as needed (swelling/fluid/weight gain). Wendall Stade, MD Taking Active   vitamin C (ASCORBIC ACID) 500 MG tablet [id number] Yes Take 500 mg by mouth daily. [provider] Taking Active  Patient Active Problem List   Diagnosis Date Noted   Acute on chronic heart failure with preserved ejection fraction (HFpEF) ([place]) 10/27/2020   Hypertensive urgency 10/27/2020   Diabetes mellitus type 2 in obese ([place]) 10/27/2020   CHF (congestive heart failure) ([place]) 12/10/2018   History of recurrent UTIs 06/30/2018   Mild intermittent asthma without complication 02/63/7858   Prediabetes 06/30/2018   Environmental and seasonal allergies 06/30/2018   Diabetes ([place]) 04/04/2018   Upper respiratory infection, viral 04/04/2018   Urinary tract infection 04/04/2018   Vitamin D deficiency 08/07/2016   Morbid obesity with BMI of  40.0-44.9, adult ([place]) 02/18/2016   Extrinsic asthma without complication 85/06/7739   SOB (shortness of breath) 06/19/2015   Cubital tunnel syndrome, right 07/06/2014   Hypokalemia 08/16/2013   Diabetes mellitus type 2, uncomplicated (HCC) 28/78/6767   GERD (gastroesophageal reflux disease) 08/28/2011   HTN (hypertension) 08/28/2011    Immunization History  Administered Date(s) Administered   Fluad Quad(high Dose 65+) 03/07/2019, 07/12/2021   Influenza Whole 02/23/2018   Influenza, High Dose Seasonal PF 04/23/2015, 03/04/2016   Influenza, Seasonal, Injecte, Preservative Fre 03/28/2013, 05/12/2014   PFIZER(Purple Top)SARS-COV-2 Vaccination 07/21/2019, 08/16/2019, 02/29/2020   PPD Test 02/18/2016   Pneumococcal Conjugate-13 04/23/2015   Pneumococcal Polysaccharide-23 08/11/2013, 06/29/2019   Tdap 05/12/2014    Conditions to be addressed/monitored:  Hypertension, Hyperlipidemia, Diabetes, Heart Failure, GERD, and Asthma  Care Plan : [place]  Updates made by Viona Gilmore, [place] since 08/28/2021 12:00 AM     Problem: Problem: Hypertension, Hyperlipidemia, Diabetes, Heart Failure, GERD, and Asthma      Long-Range Goal: Patient-Specific Goal   Start Date: 08/28/2021  Expected End Date: 08/29/2022  This Visit's Progress: On track  Priority: High  Note:   Current Barriers:  Unable to independently monitor therapeutic efficacy Unable to achieve control of asthma  Does not adhere to prescribed medication regimen  Pharmacist Clinical Goal(s):  Patient will achieve adherence to monitoring guidelines and medication adherence to achieve therapeutic efficacy achieve control of asthma as evidenced by use of albuterol  through collaboration with PharmD and provider.   Interventions: 1:1 collaboration with Billie Ruddy, MD regarding development and update of comprehensive plan of care as evidenced by provider attestation and co-signature Inter-disciplinary care team  collaboration (see longitudinal plan of care) Comprehensive medication review performed; medication list updated in electronic medical record  Hypertension (BP goal <130/80) -Uncontrolled -Current treatment: Entresto 49-51 mg 1 tablet twice daily - Appropriate, Effective, Safe, Accessible Carvedilol 25 mg 1 tablet twice daily - Appropriate, Effective, Query Safe, Accessible -Medications previously tried: unknown  -Current home readings: not checking regularly -Current dietary habits: trying to limit salt intake but difficult with current living sitation -Current exercise habits: not doing much right now -Denies hypotensive/hypertensive symptoms -Educated on BP goals and benefits of medications for prevention of heart attack, stroke and kidney damage; Exercise goal of 150 minutes per week; Importance of home blood pressure monitoring; Proper BP monitoring technique; -Counseled to monitor BP at home weekly, document, and provide log at future appointments -Counseled on diet and exercise extensively Recommended to continue current medication  Diabetes (A1c goal <7%) -Controlled -Current medications: No medications -Medications previously tried: metformin (in stool; ineffective), Farxiga (side effects)   -Current home glucose readings fasting glucose: 135 post prandial glucose: n/a -Denies hypoglycemic/hyperglycemic symptoms -Current meal patterns:  breakfast: did not discuss  lunch: did not discuss  dinner: did not discuss snacks: did not discuss drinks: mostly water -Current  exercise: no formal exercise right now -Educated on Benefits of weight loss; Benefits of routine self-monitoring of blood sugar; Benefits of weight loss medications for diabetes as well. -Counseled to check feet daily and get yearly eye exams -Counseled on diet and exercise extensively  Heart Failure (Goal: manage symptoms and prevent exacerbations) -Controlled -Last ejection fraction: 60-65% (Date:  02/25/21) -HF type: Diastolic -NYHA Class: II (slight limitation of activity) -AHA HF Stage: C (Heart disease and symptoms present) -Current treatment: Entresto 49-51 mg 1 tablet twice daily - Appropriate, Effective, Safe, Accessible Carvedilol 25 mg 1 tablet twice daily - Appropriate, Effective, Query Safe, Accessible Torsemide 20 mg 1 tablet daily as needed - Appropriate, Effective, Safe, Accessible Potassium chloride 20 mEq/15 mL 15 mLs daily - Appropriate, Effective, Safe, Accessible -Medications previously tried: [place] (side effects)  -Current home BP/HR readings: haven't been checking regularly (wants to get a wrist cuff) -Current dietary habits: cooks with a pinch of salt; buying kosher salt; frozen meals some and eating out some but knows she needs to cut this out  -Current exercise habits: doesn't have much time now -Educated on Benefits of medications for managing symptoms and prolonging life Importance of weighing daily; if you gain more than 3 pounds in one day or 5 pounds in one week, take torsemide. Proper diuretic administration and potassium supplementation Importance of blood pressure control -Counseled on diet and exercise extensively Recommended to continue current medication  Asthma/allergies (Goal: control symptoms) -Uncontrolled -Current treatment  Dulera 100/5 mcg 2 puffs into the lungs twice daily - Appropriate, Query effective, Safe, Accessible Albuterol HFA 2 puffs every 4 hours as needed - Appropriate, Query effective, Safe, Accessible Montelukast 10 mg 1 tablet daily as needed - Appropriate, Query effective, Safe, Accessible Duoneb as needed - Appropriate, Effective, Safe, Accessible Levocetirizine 5 mg 1 tablet daily - Appropriate, Effective, Safe, Accessible -Medications previously tried: Advair  -Pulmonary function testing: 2020 -Exacerbations requiring treatment in last 6 months: no -Patient denies consistent use of maintenance inhaler -Frequency of  rescue inhaler use: using every 6 hours right now -Counseled on Proper inhaler technique; Benefits of consistent maintenance inhaler use When to use rescue inhaler Differences between maintenance and rescue inhalers -Recommended restarting Dulera and discuss with  Gout (Goal: uric acid < 6) -Not ideally controlled -Current treatment  Allopurinol 100 mg 1 tablet daily as needed - Query Appropriate, Effective, Safe, Accessible -Medications previously tried: none  -Counseled on foods that can increase risk for gout flare ups.  GERD (Goal: minimize symptoms) -Controlled -Current treatment  Famotidine 40 mg 1 tablet every morning - Appropriate, Effective, Safe, Accessible -Medications previously tried: omeprazole -Counseled on non-pharmacologic management of symptoms such as elevating the head of your bed, avoiding eating 2-3 hours before bed, avoiding triggering foods such as acidic, spicy, or fatty foods, eating smaller meals, and wearing clothes that are loose around the waist.   Health Maintenance -Vaccine gaps: shingrix, COVID booster -Current therapy:  Vitamin B12 1000 mcg daily Magnesium oxide 400 mg 1 tablet as needed Vitamin D 5000 units daily Vitamin C 500 mg daily Centrum senior every 3 days  -Educated on Cost vs benefit of each product must be carefully weighed by individual consumer -Patient is satisfied with current therapy and denies issues -Recommended to continue current medication  Patient Goals/Self-Care Activities Patient will:  - take medications as prescribed as evidenced by patient report and record review check glucose a few times a week, document, and provide at future appointments check blood pressure weekly, document,  and provide at future appointments weigh daily, and contact provider if weight gain of > 3 lbs in one day or > 5 lbs in one week target a minimum of 150 minutes of moderate intensity exercise weekly  Follow Up Plan: The care management  team will reach out to the patient again over the next 14 days.       Medication Assistance:  [name] obtained through [employer] medication assistance program.  Enrollment ends 05/25/22  Compliance/Adherence/Medication fill history: Care Gaps: DEXA, foot exam, eye exam, shingrix, COVID booster Last BP - 130/70 on 07/12/2021 Last A1C - 6.8 on 07/12/2021   Star-Rating Drugs: None  Patient's preferred pharmacy is:  [id number] [address], [place] [address] Phone: [phone] Fax: [phone]  [name] - [place], [place] - [address] [place] [address] Phone: [phone] Fax: [phone]  [facility] [place], [place] [address] [place] [address] Phone: [phone] Fax: [phone]  [id number] [place], [address] [place] [address] Phone: [phone] Fax: [phone]  Uses pill box? Yes Pt endorses 99% compliance  We discussed: Current pharmacy is preferred with insurance plan and patient is satisfied with pharmacy services Patient decided to: Continue current medication management strategy  Care Plan and Follow Up Patient Decision:  Patient agrees to Care Plan and Follow-up.  Plan: The care management team will reach out to the patient again over the next 14 days.  [name], PharmD, [place] Pharmacist [place] [facility] [place]

## 2021-08-28 ENCOUNTER — Ambulatory Visit (INDEPENDENT_AMBULATORY_CARE_PROVIDER_SITE_OTHER): Payer: Medicare (Managed Care) | Admitting: Pharmacist

## 2021-08-28 ENCOUNTER — Telehealth: Payer: Self-pay

## 2021-08-28 DIAGNOSIS — I10 Essential (primary) hypertension: Secondary | ICD-10-CM

## 2021-08-28 DIAGNOSIS — R058 Other specified cough: Secondary | ICD-10-CM

## 2021-08-28 DIAGNOSIS — J4540 Moderate persistent asthma, uncomplicated: Secondary | ICD-10-CM

## 2021-08-28 DIAGNOSIS — R053 Chronic cough: Secondary | ICD-10-CM

## 2021-08-28 DIAGNOSIS — I5032 Chronic diastolic (congestive) heart failure: Secondary | ICD-10-CM

## 2021-08-28 MED ORDER — MONTELUKAST SODIUM 10 MG PO TABS: 10.0000 mg | ORAL_TABLET | Freq: Every day | ORAL | 1 refills | Status: DC

## 2021-08-28 NOTE — Addendum Note (Signed)
Addended by: Elza Rafter D on: 08/28/2021 02:34 PM ? ? Modules accepted: Orders ? ?

## 2021-08-28 NOTE — Addendum Note (Signed)
Addended by: Elza Rafter D on: 08/28/2021 02:36 PM ? ? Modules accepted: Orders ? ?

## 2021-08-28 NOTE — Patient Instructions (Signed)
Hi Paulette,  It was great to get to meet you over the telephone! Below is a summary of some of the topics we discussed.   Don't forget to: Try to get set up with the [facility] on [address], [place], [place] [address] (telephone number: [phone]) Start checking blood pressures at least once a week Start using the [facility] inhaler regularly (2 puffs twice a day) and ask Dr. Halford Chessman about using this or the Advair - if either one is too expensive, we can explore patient assistance as well  Please reach out to me if you have any questions or need anything before our follow up!  Best, Maddie  [name], PharmD, [facility]   Visit Information   Goals Addressed             This Visit's Progress    Track and Manage My Blood Pressure-Hypertension       Timeframe:  Long-Range Goal Priority:  Medium Start Date:                             Expected End Date:                       Follow Up Date 11/27/21    - check blood pressure weekly - choose a place to take my blood pressure (home, clinic or office, retail store) - write blood pressure results in a log or diary    Why is this important?   You won't feel high blood pressure, but it can still hurt your blood vessels.  High blood pressure can cause heart or kidney problems. It can also cause a stroke.  Making lifestyle changes like losing a little weight or eating less salt will help.  Checking your blood pressure at home and at different times of the day can help to control blood pressure.  If the doctor prescribes medicine remember to take it the way the doctor ordered.  Call the office if you cannot afford the medicine or if there are questions about it.     Notes:        Patient Care Plan: CCM Pharmacy Care Plan     Problem Identified: Problem: Hypertension, Hyperlipidemia, Diabetes, Heart Failure, GERD, and Asthma      Long-Range  Goal: Patient-Specific Goal   Start Date: 08/28/2021  Expected End Date: 08/29/2022  This Visit's Progress: On track  Priority: High  Note:   Current Barriers:  Unable to independently monitor therapeutic efficacy Unable to achieve control of asthma  Does not adhere to prescribed medication regimen  Pharmacist Clinical Goal(s):  Patient will achieve adherence to monitoring guidelines and medication adherence to achieve therapeutic efficacy achieve control of asthma as evidenced by use of albuterol  through collaboration with PharmD and provider.   Interventions: 1:1 collaboration with Billie Ruddy, MD regarding development and update of comprehensive plan of care as evidenced by provider attestation and co-signature Inter-disciplinary care team collaboration (see longitudinal plan of care) Comprehensive medication review performed; medication list updated in electronic medical record  Hypertension (BP goal <130/80) -Uncontrolled -Current treatment: Entresto 49-51 mg 1 tablet twice daily - Appropriate, Effective, Safe, Accessible Carvedilol 25 mg 1 tablet twice daily - Appropriate, Effective, Query Safe, Accessible -Medications previously tried: unknown  -Current home readings: not checking regularly -Current dietary habits: trying to limit salt intake but difficult with current  living sitation -Current exercise habits: not doing much right now -Denies hypotensive/hypertensive symptoms -Educated on BP goals and benefits of medications for prevention of heart attack, stroke and kidney damage; Exercise goal of 150 minutes per week; Importance of home blood pressure monitoring; Proper BP monitoring technique; -Counseled to monitor BP at home weekly, document, and provide log at future appointments -Counseled on diet and exercise extensively Recommended to continue current medication  Diabetes (A1c goal <7%) -Controlled -Current medications: No medications -Medications previously  tried: metformin (in stool; ineffective), Farxiga (side effects)   -Current home glucose readings fasting glucose: 135 post prandial glucose: n/a -Denies hypoglycemic/hyperglycemic symptoms -Current meal patterns:  breakfast: did not discuss  lunch: did not discuss  dinner: did not discuss snacks: did not discuss drinks: mostly water -Current exercise: no formal exercise right now -Educated on Benefits of weight loss; Benefits of routine self-monitoring of blood sugar; Benefits of weight loss medications for diabetes as well. -Counseled to check feet daily and get yearly eye exams -Counseled on diet and exercise extensively  Heart Failure (Goal: manage symptoms and prevent exacerbations) -Controlled -Last ejection fraction: 60-65% (Date: 02/25/21) -HF type: Diastolic -NYHA Class: II (slight limitation of activity) -AHA HF Stage: C (Heart disease and symptoms present) -Current treatment: Entresto 49-51 mg 1 tablet twice daily - Appropriate, Effective, Safe, Accessible Carvedilol 25 mg 1 tablet twice daily - Appropriate, Effective, Query Safe, Accessible Torsemide 20 mg 1 tablet daily as needed - Appropriate, Effective, Safe, Accessible Potassium chloride 20 mEq/15 mL 15 mLs daily - Appropriate, Effective, Safe, Accessible -Medications previously tried: [place] (side effects)  -Current home BP/HR readings: haven't been checking regularly (wants to get a wrist cuff) -Current dietary habits: cooks with a pinch of salt; buying kosher salt; frozen meals some and eating out some but knows she needs to cut this out  -Current exercise habits: doesn't have much time now -Educated on Benefits of medications for managing symptoms and prolonging life Importance of weighing daily; if you gain more than 3 pounds in one day or 5 pounds in one week, take torsemide. Proper diuretic administration and potassium supplementation Importance of blood pressure control -Counseled on diet and exercise  extensively Recommended to continue current medication  Asthma/allergies (Goal: control symptoms) -Uncontrolled -Current treatment  Dulera 100/5 mcg 2 puffs into the lungs twice daily - Appropriate, Query effective, Safe, Accessible Albuterol HFA 2 puffs every 4 hours as needed - Appropriate, Query effective, Safe, Accessible Montelukast 10 mg 1 tablet daily as needed - Appropriate, Query effective, Safe, Accessible Duoneb as needed - Appropriate, Effective, Safe, Accessible Levocetirizine 5 mg 1 tablet daily - Appropriate, Effective, Safe, Accessible -Medications previously tried: Advair  -Pulmonary function testing: 2020 -Exacerbations requiring treatment in last 6 months: no -Patient denies consistent use of maintenance inhaler -Frequency of rescue inhaler use: using every 6 hours right now -Counseled on Proper inhaler technique; Benefits of consistent maintenance inhaler use When to use rescue inhaler Differences between maintenance and rescue inhalers -Recommended restarting Dulera and discuss with  Gout (Goal: uric acid < 6) -Not ideally controlled -Current treatment  Allopurinol 100 mg 1 tablet daily as needed - Query Appropriate, Effective, Safe, Accessible -Medications previously tried: none  -Counseled on foods that can increase risk for gout flare ups.  GERD (Goal: minimize symptoms) -Controlled -Current treatment  Famotidine 40 mg 1 tablet every morning - Appropriate, Effective, Safe, Accessible -Medications previously tried: omeprazole -Counseled on non-pharmacologic management of symptoms such as elevating the head of your bed, avoiding eating  2-3 hours before bed, avoiding triggering foods such as acidic, spicy, or fatty foods, eating smaller meals, and wearing clothes that are loose around the waist.   Health Maintenance -Vaccine gaps: shingrix, COVID booster -Current therapy:  Vitamin B12 1000 mcg daily Magnesium oxide 400 mg 1 tablet as needed Vitamin D  5000 units daily Vitamin C 500 mg daily Centrum senior every 3 days  -Educated on Cost vs benefit of each product must be carefully weighed by individual consumer -Patient is satisfied with current therapy and denies issues -Recommended to continue current medication  Patient Goals/Self-Care Activities Patient will:  - take medications as prescribed as evidenced by patient report and record review check glucose a few times a week, document, and provide at future appointments check blood pressure weekly, document, and provide at future appointments weigh daily, and contact provider if weight gain of > 3 lbs in one day or > 5 lbs in one week target a minimum of 150 minutes of moderate intensity exercise weekly  Follow Up Plan: The care management team will reach out to the patient again over the next 14 days.       Ms. Krizan was given information about Chronic Care Management services today including:  CCM service includes personalized support from designated clinical staff supervised by her physician, including individualized plan of care and coordination with other care providers 24/7 contact phone numbers for assistance for urgent and routine care needs. Standard insurance, coinsurance, copays and deductibles apply for chronic care management only during months in which we provide at least 20 minutes of these services. Most insurances cover these services at 100%, however patients may be responsible for any copay, coinsurance and/or deductible if applicable. This service may help you avoid the need for more expensive face-to-face services. Only one practitioner may furnish and bill the service in a calendar month. The patient may stop CCM services at any time (effective at the end of the month) by phone call to the office staff.  Patient agreed to services and verbal consent obtained.   The patient verbalized understanding of instructions, educational materials, and care plan provided  today and agreed to receive a mailed copy of patient instructions, educational materials, and care plan.  The pharmacy team will reach out to the patient again over the next 14 days.   Viona Gilmore, [facility]

## 2021-08-28 NOTE — Telephone Encounter (Signed)
---  Caller states her mother saw PCP 2 days ago and ?has allergy exacerbation. She is out of Singulair. Uses ?[facility] in [place] phone [phone] [place], [place] ? ?Pt sees pulmonologist & that is the prescriber of the medication. Pt states she has reached out to pulmonologist & has not heard from them (per the note she has not seen them since 2020). Pt is requesting PCP refill medication. Last OV 07/12/21 ?

## 2021-08-28 NOTE — Telephone Encounter (Signed)
Ok to refill singulair. ?

## 2021-08-30 ENCOUNTER — Encounter: Payer: Self-pay | Admitting: Primary Care

## 2021-08-30 ENCOUNTER — Ambulatory Visit (INDEPENDENT_AMBULATORY_CARE_PROVIDER_SITE_OTHER): Payer: Medicare (Managed Care) | Admitting: Primary Care

## 2021-08-30 DIAGNOSIS — R053 Chronic cough: Secondary | ICD-10-CM

## 2021-08-30 DIAGNOSIS — R058 Other specified cough: Secondary | ICD-10-CM

## 2021-08-30 DIAGNOSIS — J4520 Mild intermittent asthma, uncomplicated: Secondary | ICD-10-CM | POA: Diagnosis not present

## 2021-08-30 DIAGNOSIS — J4540 Moderate persistent asthma, uncomplicated: Secondary | ICD-10-CM | POA: Diagnosis not present

## 2021-08-30 MED ORDER — MOMETASONE FURO-FORMOTEROL FUM 100-5 MCG/ACT IN AERO
2.0000 | INHALATION_SPRAY | Freq: Two times a day (BID) | RESPIRATORY_TRACT | 0 refills | Status: DC
Start: 2021-08-30 — End: 2021-08-30

## 2021-08-30 MED ORDER — MONTELUKAST SODIUM 10 MG PO TABS: 10.0000 mg | ORAL_TABLET | Freq: Every day | ORAL | 0 refills | Status: DC

## 2021-08-30 MED ORDER — MOMETASONE FURO-FORMOTEROL FUM 100-5 MCG/ACT IN AERO: 2.0000 | INHALATION_SPRAY | Freq: Two times a day (BID) | RESPIRATORY_TRACT | 10 refills | Status: DC

## 2021-08-30 MED ORDER — ALBUTEROL SULFATE HFA 108 (90 BASE) MCG/ACT IN AERS
2.0000 | INHALATION_SPRAY | Freq: Four times a day (QID) | RESPIRATORY_TRACT | 0 refills | Status: DC | PRN
Start: 2021-08-30 — End: 2021-08-30

## 2021-08-30 MED ORDER — ALBUTEROL SULFATE HFA 108 (90 BASE) MCG/ACT IN AERS: 2.0000 | INHALATION_SPRAY | Freq: Four times a day (QID) | RESPIRATORY_TRACT | 0 refills | Status: DC | PRN

## 2021-08-30 MED ORDER — MONTELUKAST SODIUM 10 MG PO TABS: 10.0000 mg | ORAL_TABLET | Freq: Every day | ORAL | 3 refills | Status: DC

## 2021-08-30 MED ORDER — ALBUTEROL SULFATE HFA 108 (90 BASE) MCG/ACT IN AERS: 2.0000 | INHALATION_SPRAY | Freq: Four times a day (QID) | RESPIRATORY_TRACT | 2 refills | Status: DC | PRN

## 2021-08-30 NOTE — Patient Instructions (Signed)
Recommendations: ?- Restart Dulera 154mg- Take two puffs every morning and evening (rinse mouth after use) ?- Use Albuterol rescue inhaler 2 puffs every 4-6 hours as needed only for breakthrough shortness of breath/wheezing ?- Take Singulair '10mg'$  at bedtime every day  ?- Take mucinex twice a day as needed for chest/congestion  ? ?Follow-up: ?- 6 months with Dr. SHalford Chessmanor BEustaquio MaizeNP  ? ? ?Asthma, Adult ?Asthma is a long-term (chronic) condition in which the airways get tight and narrow. The airways are the breathing passages that lead from the nose and mouth down into the lungs. A person with asthma will have times when symptoms get worse. These are called asthma attacks. They can cause coughing, whistling sounds when you breathe (wheezing), shortness of breath, and chest pain. They can make it hard to breathe. There is no cure for asthma, but medicines and lifestyle changes can help control it. ?There are many things that can bring on an asthma attack or make asthma symptoms worse (triggers). Common triggers include: ?Mold. ?Dust. ?Cigarette smoke. ?Cockroaches. ?Things that can cause allergy symptoms (allergens). These include animal skin flakes (dander) and pollen from trees or grass. ?Things that pollute the air. These may include household cleaners, wood smoke, smog, or chemical odors. ?Cold air, weather changes, and wind. ?Crying or laughing hard. ?Stress. ?Certain medicines or drugs. ?Certain foods such as dried fruit, potato chips, and grape juice. ?Infections, such as a cold or the flu. ?Certain medical conditions or diseases. ?Exercise or tiring activities. ?Asthma may be treated with medicines and by staying away from the things that cause asthma attacks. Types of medicines may include: ?Controller medicines. These help prevent asthma symptoms. They are usually taken every day. ?Fast-acting reliever or rescue medicines. These quickly relieve asthma symptoms. They are used as needed and provide short-term  relief. ?Allergy medicines if your attacks are brought on by allergens. ?Medicines to help control the body's defense (immune) system. ?Follow these instructions at home: ?Avoiding triggers in your home ?Change your heating and air conditioning filter often. ?Limit your use of fireplaces and wood stoves. ?Get rid of pests (such as roaches and mice) and their droppings. ?Throw away plants if you see mold on them. ?Clean your floors. Dust regularly. Use cleaning products that do not smell. ?Have someone vacuum when you are not home. Use a vacuum cleaner with a HEPA filter if possible. ?Replace carpet with wood, tile, or vinyl flooring. Carpet can trap animal skin flakes and dust. ?Use allergy-proof pillows, mattress covers, and box spring covers. ?Wash bed sheets and blankets every week in hot water. Dry them in a dryer. ?Keep your bedroom free of any triggers. ?Avoid pets and keep windows closed when things that cause allergy symptoms are in the air. ?Use blankets that are made of polyester or cotton. ?Clean bathrooms and kitchens with bleach. If possible, have someone repaint the walls in these rooms with mold-resistant paint. Keep out of the rooms that are being cleaned and painted. ?Wash your hands often with soap and water. If soap and water are not available, use hand sanitizer. ?Do not allow anyone to smoke in your home. ?General instructions ?Take over-the-counter and prescription medicines only as told by your doctor. ?Talk with your doctor if you have questions about how or when to take your medicines. ?Make note if you need to use your medicines more often than usual. ?Do not use any products that contain nicotine or tobacco, such as cigarettes and e-cigarettes. If you need help quitting,  ask your doctor. ?Stay away from secondhand smoke. ?Avoid doing things outdoors when allergen counts are high and when air quality is low. ?Wear a ski mask when doing outdoor activities in the winter. The mask should cover  your nose and mouth. Exercise indoors on cold days if you can. ?Warm up before you exercise. Take time to cool down after exercise. ?Use a peak flow meter as told by your doctor. A peak flow meter is a tool that measures how well the lungs are working. ?Keep track of the peak flow meter's readings. Write them down. ?Follow your asthma action plan. This is a written plan for taking care of your asthma and treating your attacks. ?Make sure you get all the shots (vaccines) that your doctor recommends. Ask your doctor about a flu shot and a pneumonia shot. ?Keep all follow-up visits as told by your doctor. This is important. ?Contact a doctor if: ?You have wheezing, shortness of breath, or a cough even while taking medicine to prevent attacks. ?The mucus you cough up (sputum) is thicker than usual. ?The mucus you cough up changes from clear or white to yellow, green, gray, or bloody. ?You have problems from the medicine you are taking, such as: ?A rash. ?Itching. ?Swelling. ?Trouble breathing. ?You need reliever medicines more than 2-3 times a week. ?Your peak flow reading is still at 50-79% of your personal best after following the action plan for 1 hour. ?You have a fever. ?Get help right away if: ?You seem to be worse and are not responding to medicine during an asthma attack. ?You are short of breath even at rest. ?You get short of breath when doing very little activity. ?You have trouble eating, drinking, or talking. ?You have chest pain or tightness. ?You have a fast heartbeat. ?Your lips or fingernails start to turn blue. ?You are light-headed or dizzy, or you faint. ?Your peak flow is less than 50% of your personal best. ?You feel too tired to breathe normally. ?Summary ?Asthma is a long-term (chronic) condition in which the airways get tight and narrow. An asthma attack can make it hard to breathe. ?Asthma cannot be cured, but medicines and lifestyle changes can help control it. ?Make sure you understand how to  avoid triggers and how and when to use your medicines. ?This information is not intended to replace advice given to you by your health care provider. Make sure you discuss any questions you have with your health care provider. ?Document Revised: 09/04/2019 Document Reviewed: 09/14/2019 ?Elsevier Patient Education ? [employer]. ? ?

## 2021-08-30 NOTE — Progress Notes (Signed)
'@Patient'  ID: Beth Macias, female    DOB: 1950-09-25, 71 y.o.   MRN: [id number]  Chief Complaint  Patient presents with   Follow-up    Follow up. Pt states she is having issues with her inhalers. She is here for asthma follow up. She wants her singular sent to a local pharmacy. [name] states she needs refill son her inhalers too.     Referring provider: Billie Ruddy, MD  HPI: 71 year old female, former smoker. PMH significant for mild intermittent asthma, HTN, CHF, type 2 diabetes, GERD. Patient of Dr. Halford Chessman, last seen on 01/14/2019.   08/30/2021 - Interim hx  Patient presents today for overdue follow-up. No acute respiratory complaints. She is needing refills of her asthma medication today. She was hospitalized in June 2022 for asthma. Her symptoms flare during allergy season. She runs into trouble breathing wise when the trees start blooming. She ran out of [facility] awhile back. Needs refills of Dulera, albuterol and singulair. ACT 8.    Serology 11/14/11 >> SM, RNP, Ro, La, ANA all negative PFT 06/19/15 >> FEV1 1.87 (109%), FEV1% 74, TLC 3.66 (86%), DLCO 71% CBC 03/04/16 >> Eosinophils 0.26  PFT 01/07/19 >> 1.43 (94%), FEV1% 88, TLC 2.88 (64%), DLCO 88%, no BD  Allergies  Allergen Reactions   Strawberry (Diagnostic) Anaphylaxis and Other (See Comments)    hives   Farxiga [Dapagliflozin] Other (See Comments)    Had HAs, weakness, rhinorrhea, jitteriness, nasal congestion, nocturia, yeast infections, memory changes, irritability.   Penicillins Itching    Has patient had a PCN reaction causing immediate rash, facial/tongue/throat swelling, SOB or lightheadedness with hypotension: No Has patient had a PCN reaction causing severe rash involving mucus membranes or skin necrosis: No Has patient had a PCN reaction that required hospitalization: Unknown Has patient had a PCN reaction occurring within the last 10 years: No If all of the above answers are "NO", then may proceed with  Cephalosporin use.   Tape Other (See Comments)    Blisters   Gabapentin Itching, Rash and Other (See Comments)    Incoherent    Immunization History  Administered Date(s) Administered   Fluad Quad(high Dose 65+) 03/07/2019, 07/12/2021   Influenza Whole 02/23/2018   Influenza, High Dose Seasonal PF 04/23/2015, 03/04/2016   Influenza, Seasonal, Injecte, Preservative Fre 03/28/2013, 05/12/2014   PFIZER(Purple Top)SARS-COV-2 Vaccination 07/21/2019, 08/16/2019, 02/29/2020   PPD Test 02/18/2016   Pneumococcal Conjugate-13 04/23/2015   Pneumococcal Polysaccharide-23 08/11/2013, 06/29/2019   Tdap 05/12/2014    Past Medical History:  Diagnosis Date   Asthma    Blood in stool    CHF (congestive heart failure) ([place])    Colon polyps    Diabetes mellitus without complication (HCC)    Diverticulitis    Fibromyalgia    GERD (gastroesophageal reflux disease)    Gout    Hypercholesteremia    Hypertension    Pneumonia    Pneumonia    UTI (urinary tract infection)     Tobacco History: Social History   Tobacco Use  Smoking Status Former   Packs/day: 0.25   Years: 9.00   Pack years: 2.25   Types: Cigarettes   Quit date: 05/26/1976   Years since quitting: 45.4  Smokeless Tobacco Never   Counseling given: Not Answered   Outpatient Medications Prior to Visit  Medication Sig Dispense Refill   allopurinol (ZYLOPRIM) 100 MG tablet Take 1 tablet (100 mg total) by mouth daily as needed (gout attacks).     blood  glucose meter kit and supplies KIT Dispense based on patient and insurance preference. Use up to three times daily as directed. 1 each 0   cholecalciferol (VITAMIN D) 25 MCG (1000 UNIT) tablet Take 1,000 Units by mouth at bedtime.      Cyanocobalamin ([employer] B12 GUMMIES PO) Take 1,000 mcg by mouth daily.     famotidine (PEPCID) 40 MG tablet Take 40 mg by mouth in the morning.     glucose blood (ONETOUCH ULTRA) test strip Use to check blood sugar 4-5 times daily 50 each 5    ipratropium-albuterol (DUONEB) 0.5-2.5 (3) MG/3ML SOLN Take 3 mLs by nebulization every 6 (six) hours as needed (shortness of breath/wheezing).     levocetirizine (XYZAL) 5 MG tablet Take 5 mg by mouth daily.     magnesium oxide (MAG-OX) 400 MG tablet Take 400 mg by mouth as needed.     Multiple Vitamins-Minerals (MULTIVITAMIN WITH MINERALS) tablet Take 1 tablet by mouth daily. (Patient not taking: Reported on 09/26/2021)     potassium chloride 20 MEQ/15ML (10%) SOLN Take 15 mLs (20 mEq total) by mouth daily as needed (with each dose of torsemide). 473 mL 0   sacubitril-valsartan (ENTRESTO) 49-51 MG Take 1 tablet by mouth 2 (two) times daily. 180 tablet 1   torsemide (DEMADEX) 20 MG tablet Take 1 tablet (20 mg total) by mouth daily as needed (swelling/fluid/weight gain). 90 tablet 1   vitamin C (ASCORBIC ACID) 500 MG tablet Take 500 mg by mouth daily.     albuterol (VENTOLIN HFA) 108 (90 Base) MCG/ACT inhaler Inhale 2 puffs into the lungs every 6 (six) hours as needed for wheezing or shortness of breath.     carvedilol (COREG) 25 MG tablet Take 1 tablet (25 mg total) by mouth 2 (two) times daily. Please schedule yearly appointment for future refills. Thank you 60 tablet 0   mometasone-formoterol (DULERA) 100-5 MCG/ACT AERO Inhale 2 puffs into the lungs 2 (two) times daily.     montelukast (SINGULAIR) 10 MG tablet Take 1 tablet (10 mg total) by mouth daily. 90 tablet 1   No facility-administered medications prior to visit.      Review of Systems  Review of Systems  Constitutional: Negative.   HENT: Negative.    Respiratory: Negative.      Physical Exam  BP 122/82 (BP Location: Left Arm, Patient Position: Sitting, Cuff Size: Normal)   Pulse 69   Temp 98.6 F (37 C) (Oral)   Ht 5' (1.524 m)   Wt 222 lb 6.4 oz (100.9 kg)   SpO2 96%   BMI 43.43 kg/m  Physical Exam Constitutional:      Appearance: Normal appearance.  HENT:     Head: Normocephalic and atraumatic.  Cardiovascular:      Rate and Rhythm: Normal rate and regular rhythm.  Pulmonary:     Effort: Pulmonary effort is normal.     Breath sounds: Normal breath sounds.  Musculoskeletal:        General: Normal range of motion.  Skin:    General: Skin is warm and dry.  Neurological:     General: No focal deficit present.     Mental Status: She is alert and oriented to person, place, and time. Mental status is at baseline.  Psychiatric:        Mood and Affect: Mood normal.        Behavior: Behavior normal.        Thought Content: Thought content normal.  Judgment: Judgment normal.     Lab Results:  CBC    Component Value Date/Time   WBC 5.6 07/12/2021 1016   RBC 4.82 07/12/2021 1016   HGB 13.4 07/12/2021 1016   HCT 41.1 07/12/2021 1016   PLT 237.0 07/12/2021 1016   MCV 85.4 07/12/2021 1016   MCH 28.0 10/29/2020 0021   MCHC 32.7 07/12/2021 1016   RDW 14.7 07/12/2021 1016   LYMPHSABS 1.4 07/12/2021 1016   MONOABS 0.4 07/12/2021 1016   EOSABS 0.3 07/12/2021 1016   BASOSABS 0.0 07/12/2021 1016    BMET    Component Value Date/Time   NA 141 07/12/2021 1016   NA 144 02/29/2020 1010   K 3.4 (L) 07/12/2021 1016   CL 101 07/12/2021 1016   CO2 36 (H) 07/12/2021 1016   GLUCOSE 130 (H) 07/12/2021 1016   BUN 21 07/12/2021 1016   BUN 21 02/29/2020 1010   CREATININE 0.70 07/12/2021 1016   CALCIUM 10.3 07/12/2021 1016   GFRNONAA 60 (L) 11/06/2020 1114   GFRAA 86 02/29/2020 1010    BNP    Component Value Date/Time   BNP 489.8 (H) 10/27/2020 1726    ProBNP    Component Value Date/Time   PROBNP 276.0 (H) 09/24/2020 1401    Imaging: DG Bone Density  Result Date: 09/29/2021 Table formatting from the original result was not included. Date of study: 09/26/2021 Exam: DUAL X-RAY ABSORPTIOMETRY (DXA) FOR BONE MINERAL DENSITY (BMD) Instrument: [employer] Requesting Provider: PCP Indication: screening for osteoporosis Comparison: none (please note that it is not possible to compare data from  different instruments) Clinical data: Pt is a 71 y.o. female without previous fractures. Results:  Lumbar spine L2-L4(L1) Femoral neck (FN) T-score  1.8 RFN: - 0.2 LFN: - 0.6 Assessment: the BMD is normal according to the [facility] classification for osteoporosis (see below). Fracture risk: low FRAX score: not calculated due to normal BMD Comments: the technical quality of the study is good L1 vertebra had to be excluded from analysis due to DJD Recommend optimizing calcium (1200 mg/day) and vitamin D (800 IU/day) intake. No pharmacological treatment is indicated. Followup: Repeat BMD is appropriate after 2 years. WHO criteria for diagnosis of osteoporosis in postmenopausal women and in men 27 y/o or older: - normal: T-score -1.0 to + 1.0 - osteopenia/low bone density: T-score between -2.5 and -1.0 - osteoporosis: T-score below -2.5 - severe osteoporosis: T-score below -2.5 with history of fragility fracture Note: although not part of the WHO classification, the presence of a fragility fracture, regardless of the T-score, should be considered diagnostic of osteoporosis, provided other causes for the fracture have been excluded. Interpreted by : [name] Guise, MD [facility]     Assessment & Plan:   Mild intermittent asthma without complication Asthma symptoms flared during spring while off Dulera and Singulair. Does not appear acutely exacerbated today. Last hospitalized in June 2022 for asthma.   Recommendations: - Restart Dulera 136mg- Take two puffs every morning and evening (rinse mouth after use) - Use Albuterol rescue inhaler 2 puffs every 4-6 hours as needed only for breakthrough shortness of breath/wheezing - Take Singulair 113mat bedtime every day  - Take mucinex twice a day as needed for chest/congestion   Follow-up: - 6 months with Dr. SoHalford Chessmanr BeEustaquio MaizeP    ElMartyn EhrichNP 10/14/2021

## 2021-09-04 NOTE — Telephone Encounter (Signed)
Patient was seen by [facility] on 08/30/21.  ?

## 2021-09-05 ENCOUNTER — Telehealth: Payer: Self-pay

## 2021-09-05 ENCOUNTER — Other Ambulatory Visit (HOSPITAL_COMMUNITY): Payer: Self-pay

## 2021-09-05 MED ORDER — NEBIVOLOL HCL 5 MG PO TABS: 5.0000 mg | ORAL_TABLET | Freq: Every morning | ORAL | 3 refills | Status: DC

## 2021-09-05 NOTE — Telephone Encounter (Signed)
-----  Message from Viona Gilmore, [facility] sent at 09/05/2021 12:56 PM EDT ----- ?Regarding: FW: Carvedilol question ?Hi, ?Just following up on this - it doesn't look like a new prescription was ever sent in? I don't have the ability to so I figured Dr. Johnsie Cancel was sending to you to send in. ? ?Please let me know so I can reach back out to the patient! ?Best, ?Maddie ?----- Message ----- ?From: Josue Hector, MD ?Sent: 08/28/2021   2:03 PM EDT ?To: Michaelyn Barter, RN, Viona Gilmore, [facility] ?Subject: RE: Carvedilol question                       ? ?The most cardioselective beta blocker is bystolic Can d/c coreg and start Bystolic 5 mg daily in am  ?----- Message ----- ?From: Viona Gilmore, [facility] ?Sent: 08/28/2021   1:58 PM EDT ?To: Josue Hector, MD ?Subject: Carvedilol question                           ? ?Hi, ? ?I just met with Ms. Arreaga to go through her medications and had a question about the carvedilol for her. She has asthma and it can become really severe in the spring and fall. Right now she is experiencing an asthma exacerbation and is taking albuterol regularly. Due to carvedilol being a non-selective beta blocker, would you consider switching her to metoprolol? I figured the carvedilol was chosen for better blood pressure lowering but with her having asthma, I was thinking the metoprolol would be less likely to make matters worse for her breathing. ? ?Let me know what you think! ?Best, ?Maddie ? ?[name], PharmD, BCACP ?Clinical Pharmacist ?Therapist, music at [place] ?[phone] ? ? ? ?

## 2021-09-05 NOTE — Telephone Encounter (Signed)
Patient Advocate Encounter ?  ?Received notification from [facility] that prior authorization for [facility] 100-34mg inhaler is required by his/her insurance [employer]. ?  ?PA submitted on 09/05/21 ? ?Key#: [id number]? ?Status is pending ?   ?[facility] will continue to follow: ? ?Patient Advocate ?Fax: [phone] ?

## 2021-09-05 NOTE — Telephone Encounter (Signed)
Rx has been sent in for Bystolic 5 mg every am. ?

## 2021-09-06 ENCOUNTER — Other Ambulatory Visit (HOSPITAL_COMMUNITY): Payer: Self-pay

## 2021-09-06 DIAGNOSIS — M170 Bilateral primary osteoarthritis of knee: Secondary | ICD-10-CM | POA: Diagnosis not present

## 2021-09-06 DIAGNOSIS — M545 Low back pain, unspecified: Secondary | ICD-10-CM | POA: Diagnosis not present

## 2021-09-06 NOTE — Telephone Encounter (Signed)
Patient Advocate Encounter ? ?Prior Authorization for [facility] 100-71mg inhaler has been approved.   ? ?PA# [id number]? ?Effective dates: 08/06/21 through 09/05/22 ? ?Patient must fill w/mail order pharmacy. ? ?Spoke with Pharmacy to Process. ? ?Patient Advocate ?Fax: [phone] ?

## 2021-09-06 NOTE — Telephone Encounter (Addendum)
Called patient about the switch from carvedilol to Bystolic. Left detailed voicemail about the change in medication. Plan to follow up for BP assessment in 2-3 weeks. ? ?Called mail order pharmacy to cancel carvedilol rx to prevent from automatic refilling. ?

## 2021-09-09 ENCOUNTER — Other Ambulatory Visit: Payer: Self-pay | Admitting: Primary Care

## 2021-09-11 ENCOUNTER — Other Ambulatory Visit: Payer: Self-pay | Admitting: Radiology

## 2021-09-12 ENCOUNTER — Telehealth: Payer: Self-pay | Admitting: Family Medicine

## 2021-09-12 NOTE — Chronic Care Management (AMB) (Signed)
Call to patient per notes below to offer phone appointment with [name] on 4/26 @ 2:30 did not reach. Left voicemail  with my contact information that appointment  on 4/26 at 2:30 via phone has been held for her and if she would like a different time or date she may return a call to me to change. ? ? ? ?Ned Clines CMA ?Clinical Pharmacist Assistant ?[phone] ? ?

## 2021-09-12 NOTE — Telephone Encounter (Signed)
Spoke with patient to schedule her AWV ? ? ?Patient wanted to know if she could schedule a phone visit  ( 4/26 if possible) with maddy regarding her heart medication  ? ? ? ? ?

## 2021-09-13 DIAGNOSIS — R269 Unspecified abnormalities of gait and mobility: Secondary | ICD-10-CM | POA: Diagnosis not present

## 2021-09-13 DIAGNOSIS — M6281 Muscle weakness (generalized): Secondary | ICD-10-CM | POA: Diagnosis not present

## 2021-09-13 DIAGNOSIS — M5416 Radiculopathy, lumbar region: Secondary | ICD-10-CM | POA: Diagnosis not present

## 2021-09-17 ENCOUNTER — Telehealth: Payer: Self-pay | Admitting: Pharmacist

## 2021-09-17 DIAGNOSIS — R269 Unspecified abnormalities of gait and mobility: Secondary | ICD-10-CM | POA: Diagnosis not present

## 2021-09-17 DIAGNOSIS — M6281 Muscle weakness (generalized): Secondary | ICD-10-CM | POA: Diagnosis not present

## 2021-09-17 DIAGNOSIS — M5416 Radiculopathy, lumbar region: Secondary | ICD-10-CM | POA: Diagnosis not present

## 2021-09-17 NOTE — Chronic Care Management (AMB) (Signed)
? ? ?  Chronic Care Management ?Pharmacy Assistant  ? ?Name: Beth Macias  MRN: [id number] DOB: [birth year] ? ?09/18/2021 APPOINTMENT REMINDER ? ? ?Called [name], No answer, left message of appointment on 09/18/2021 at 2:30 via telephone visit with [name], Pharm D. Notified to have all medications, supplements, blood pressure and/or blood sugar logs available during appointment and to return call if need to reschedule. ? ?Care Gaps: ?AWV - scheduled 09/18/2021 ?Last BP - 122/82 on 08/30/2021 ?Last A1C - 3.8 on 07/12/2021 ?Dexascan - never done ?  ?Star Rating Drugs: ?None ? ?Any gaps in medications fill history? No ? ?[name] CMA  ?Clinical Pharmacist Assistant ?[phone] ? ?

## 2021-09-18 ENCOUNTER — Ambulatory Visit (INDEPENDENT_AMBULATORY_CARE_PROVIDER_SITE_OTHER): Payer: Medicare (Managed Care)

## 2021-09-18 ENCOUNTER — Ambulatory Visit: Payer: Medicare (Managed Care) | Admitting: Pharmacist

## 2021-09-18 VITALS — Ht 60.0 in | Wt 203.0 lb

## 2021-09-18 DIAGNOSIS — Z0000 Encounter for general adult medical examination without abnormal findings: Secondary | ICD-10-CM | POA: Diagnosis not present

## 2021-09-18 DIAGNOSIS — I10 Essential (primary) hypertension: Secondary | ICD-10-CM

## 2021-09-18 DIAGNOSIS — I5022 Chronic systolic (congestive) heart failure: Secondary | ICD-10-CM

## 2021-09-18 NOTE — Patient Instructions (Addendum)
Beth Macias , Thank you for taking time to come for your Medicare Wellness Visit. I appreciate your ongoing commitment to your health goals. Please review the following plan we discussed and let me know if I can assist you in the future.   These are the goals we discussed:  Goals       DIET - INCREASE WATER INTAKE      Track and Manage My Blood Pressure-Hypertension      Timeframe:  Long-Range Goal Priority:  Medium Start Date:                             Expected End Date:                       Follow Up Date 11/27/21    - check blood pressure weekly - choose a place to take my blood pressure (home, clinic or office, retail store) - write blood pressure results in a log or diary    Why is this important?   You won't feel high blood pressure, but it can still hurt your blood vessels.  High blood pressure can cause heart or kidney problems. It can also cause a stroke.  Making lifestyle changes like losing a little weight or eating less salt will help.  Checking your blood pressure at home and at different times of the day can help to control blood pressure.  If the doctor prescribes medicine remember to take it the way the doctor ordered.  Call the office if you cannot afford the medicine or if there are questions about it.     Notes:       Weight (lb) < 200 lb (90.7 kg) (pt-stated)      I would like to lose weight and increase water activities.        This is a list of the screening recommended for you and due dates:  Health Maintenance  Topic Date Due   DEXA scan (bone density measurement)  Never done   Complete foot exam   01/09/2022*   Eye exam for diabetics  01/09/2022*   COVID-19 Vaccine (4 - Booster for Pfizer series) 01/09/2022*   Zoster (Shingles) Vaccine (1 of 2) 01/09/2022*   Flu Shot  12/24/2021   Hemoglobin A1C  01/09/2022   Mammogram  07/27/2022   Tetanus Vaccine  05/12/2024   Colon Cancer Screening  04/29/2028   Pneumonia Vaccine  Completed   Hepatitis C  Screening: USPSTF Recommendation to screen - Ages 18-79 yo.  Completed   HPV Vaccine  Aged Out  *Topic was postponed. The date shown is not the original due date.     Advanced directives: Yes Copies on file  Conditions/risks identified: None  Next appointment: Follow up in one year for your annual wellness visit    Preventive Care 65 Years and Older, Female Preventive care refers to lifestyle choices and visits with your health care provider that can promote health and wellness. What does preventive care include? A yearly physical exam. This is also called an annual well check. Dental exams once or twice a year. Routine eye exams. Ask your health care provider how often you should have your eyes checked. Personal lifestyle choices, including: Daily care of your teeth and gums. Regular physical activity. Eating a healthy diet. Avoiding tobacco and drug use. Limiting alcohol use. Practicing safe sex. Taking low-dose aspirin every day. Taking vitamin and mineral supplements as  recommended by your health care provider. What happens during an annual well check? The services and screenings done by your health care provider during your annual well check will depend on your age, overall health, lifestyle risk factors, and family history of disease. Counseling  Your health care provider may ask you questions about your: Alcohol use. Tobacco use. Drug use. Emotional well-being. Home and relationship well-being. Sexual activity. Eating habits. History of falls. Memory and ability to understand (cognition). Work and work Statistician. Reproductive health. Screening  You may have the following tests or measurements: Height, weight, and BMI. Blood pressure. Lipid and cholesterol levels. These may be checked every 5 years, or more frequently if you are over 16 years old. Skin check. Lung cancer screening. You may have this screening every year starting at age 62 if you have a  30-pack-year history of smoking and currently smoke or have quit within the past 15 years. Fecal occult blood test (FOBT) of the stool. You may have this test every year starting at age 10. Flexible sigmoidoscopy or colonoscopy. You may have a sigmoidoscopy every 5 years or a colonoscopy every 10 years starting at age 82. Hepatitis C blood test. Hepatitis B blood test. Sexually transmitted disease (STD) testing. Diabetes screening. This is done by checking your blood sugar (glucose) after you have not eaten for a while (fasting). You may have this done every 1-3 years. Bone density scan. This is done to screen for osteoporosis. You may have this done starting at age 78. Mammogram. This may be done every 1-2 years. Talk to your health care provider about how often you should have regular mammograms. Talk with your health care provider about your test results, treatment options, and if necessary, the need for more tests. Vaccines  Your health care provider may recommend certain vaccines, such as: Influenza vaccine. This is recommended every year. Tetanus, diphtheria, and acellular pertussis (Tdap, Td) vaccine. You may need a Td booster every 10 years. Zoster vaccine. You may need this after age 29. Pneumococcal 13-valent conjugate (PCV13) vaccine. One dose is recommended after age 21. Pneumococcal polysaccharide (PPSV23) vaccine. One dose is recommended after age 47. Talk to your health care provider about which screenings and vaccines you need and how often you need them. This information is not intended to replace advice given to you by your health care provider. Make sure you discuss any questions you have with your health care provider. Document Released: 06/08/2015 Document Revised: 01/30/2016 Document Reviewed: 03/13/2015 Elsevier Interactive Patient Education  2017 [place] Prevention in the Home Falls can cause injuries. They can happen to people of all ages. There are many  things you can do to make your home safe and to help prevent falls. What can I do on the outside of my home? Regularly fix the edges of walkways and driveways and fix any cracks. Remove anything that might make you trip as you walk through a door, such as a raised step or threshold. Trim any bushes or trees on the path to your home. Use bright outdoor lighting. Clear any walking paths of anything that might make someone trip, such as rocks or tools. Regularly check to see if handrails are loose or broken. Make sure that both sides of any steps have handrails. Any raised decks and porches should have guardrails on the edges. Have any leaves, snow, or ice cleared regularly. Use sand or salt on walking paths during winter. Clean up any spills in your garage right  away. This includes oil or grease spills. What can I do in the bathroom? Use night lights. Install grab bars by the toilet and in the tub and shower. Do not use towel bars as grab bars. Use non-skid mats or decals in the tub or shower. If you need to sit down in the shower, use a plastic, non-slip stool. Keep the floor dry. Clean up any water that spills on the floor as soon as it happens. Remove soap buildup in the tub or shower regularly. Attach bath mats securely with double-sided non-slip rug tape. Do not have throw rugs and other things on the floor that can make you trip. What can I do in the bedroom? Use night lights. Make sure that you have a light by your bed that is easy to reach. Do not use any sheets or blankets that are too big for your bed. They should not hang down onto the floor. Have a firm chair that has side arms. You can use this for support while you get dressed. Do not have throw rugs and other things on the floor that can make you trip. What can I do in the kitchen? Clean up any spills right away. Avoid walking on wet floors. Keep items that you use a lot in easy-to-reach places. If you need to reach  something above you, use a strong step stool that has a grab bar. Keep electrical cords out of the way. Do not use floor polish or wax that makes floors slippery. If you must use wax, use non-skid floor wax. Do not have throw rugs and other things on the floor that can make you trip. What can I do with my stairs? Do not leave any items on the stairs. Make sure that there are handrails on both sides of the stairs and use them. Fix handrails that are broken or loose. Make sure that handrails are as long as the stairways. Check any carpeting to make sure that it is firmly attached to the stairs. Fix any carpet that is loose or worn. Avoid having throw rugs at the top or bottom of the stairs. If you do have throw rugs, attach them to the floor with carpet tape. Make sure that you have a light switch at the top of the stairs and the bottom of the stairs. If you do not have them, ask someone to add them for you. What else can I do to help prevent falls? Wear shoes that: Do not have high heels. Have rubber bottoms. Are comfortable and fit you well. Are closed at the toe. Do not wear sandals. If you use a stepladder: Make sure that it is fully opened. Do not climb a closed stepladder. Make sure that both sides of the stepladder are locked into place. Ask someone to hold it for you, if possible. Clearly mark and make sure that you can see: Any grab bars or handrails. First and last steps. Where the edge of each step is. Use tools that help you move around (mobility aids) if they are needed. These include: Canes. Walkers. Scooters. Crutches. Turn on the lights when you go into a dark area. Replace any light bulbs as soon as they burn out. Set up your furniture so you have a clear path. Avoid moving your furniture around. If any of your floors are uneven, fix them. If there are any pets around you, be aware of where they are. Review your medicines with your doctor. Some medicines can make you  feel dizzy. This can increase your chance of falling. Ask your doctor what other things that you can do to help prevent falls. This information is not intended to replace advice given to you by your health care provider. Make sure you discuss any questions you have with your health care provider. Document Released: 03/08/2009 Document Revised: 10/18/2015 Document Reviewed: 06/16/2014 Elsevier Interactive Patient Education  2017 [employer].

## 2021-09-18 NOTE — Patient Instructions (Signed)
Hi Celestia, ? ?It was great to catching up with you again! Go ahead and check your blood pressures regularly like we discussed as you switch medications. ? ?Please reach out to me if you have any questions or need anything before our follow up! ? ?Best, ?Maddie ? ?[name], PharmD, BCACP ?Clinical Pharmacist ?Therapist, music at [place] ?[phone] ? ?  ?

## 2021-09-18 NOTE — Progress Notes (Signed)
Chronic Care Management Pharmacy Note  09/18/2021 Name:  Beth Macias MRN:  [id number] DOB:  02-14-1951  Summary: A1c at goal < 7% Pt's asthma is not well controlled right now  Recommendations/Changes made from today's visit: -Plan to switch carvedilol to nebivolol -Recommended routine BP monitoring at home -Scheduled DEXA scan  Plan: Follow up BP & asthma assessment in 1-2 months Follow up in 4 months  Subjective: Beth Macias is an 71 y.o. year old female who is a primary patient of Billie Ruddy, MD.  The CCM team was consulted for assistance with disease management and care coordination needs.    Engaged with patient by telephone for follow up visit in response to provider referral for pharmacy case management and/or care coordination services.   Consent to Services:  The patient was given information about Chronic Care Management services, agreed to services, and gave verbal consent prior to initiation of services.  Please see initial visit note for detailed documentation.   Patient Care Team: Billie Ruddy, MD as PCP - General (Family Medicine) Josue Hector, MD as PCP - Cardiology (Cardiology) Viona Gilmore, [facility] as Pharmacist (Pharmacist)  Recent office visits: 07/12/2021 Grier Mitts MD - Patient was seen for well adult exam and additional issues. No medication changes. Follow up in 4 months.    06/28/2021 Grier Mitts MD - Patient was seen for Type 2 diabetes mellitus without complication, without long-term current use of insulin and additional issue. Discontinued Wilder Glade. Follow up for CPE.   09/17/2020 Randel Pigg LPN - Medicare wellness exam  Recent consult visits: 08/30/21 Geraldo Pitter, NP (pulmonary): Patient presented for chronic cough and asthma follow up. Refilled Dulera.  Hospital visits: None in previous 6 months   Objective:  Lab Results  Component Value Date   CREATININE 0.70 07/12/2021   BUN 21 07/12/2021   GFR  86.87 07/12/2021   GFRNONAA 60 (L) 11/06/2020   GFRAA 86 02/29/2020   NA 141 07/12/2021   K 3.4 (L) 07/12/2021   CALCIUM 10.3 07/12/2021   CO2 36 (H) 07/12/2021   GLUCOSE 130 (H) 07/12/2021    Lab Results  Component Value Date/Time   HGBA1C 6.8 (H) 07/12/2021 10:16 AM   HGBA1C 7.0 (H) 09/24/2020 02:01 PM   GFR 86.87 07/12/2021 10:16 AM   GFR 84.45 09/24/2020 02:01 PM    Last diabetic Eye exam:  Lab Results  Component Value Date/Time   HMDIABEYEEXA No Retinopathy 11/23/2019 12:00 AM    Last diabetic Foot exam: No results found for: HMDIABFOOTEX   Lab Results  Component Value Date   CHOL 213 (H) 07/12/2021   HDL 59.50 07/12/2021   LDLCALC 127 (H) 07/12/2021   TRIG 135.0 07/12/2021   CHOLHDL 4 07/12/2021       Latest Ref Rng & Units 07/12/2021   10:16 AM 03/27/2021    3:01 PM 02/29/2020   10:10 AM  Hepatic Function  Total Protein 6.0 - 8.3 g/dL 6.9   6.0   6.6    Albumin 3.5 - 5.2 g/dL 4.3    3.9    AST 0 - 37 U/L 15    12    ALT 0 - 35 U/L 10    12    Alk Phosphatase 39 - 117 U/L 61    79    Total Bilirubin 0.2 - 1.2 mg/dL 1.0    0.7      Lab Results  Component Value Date/Time   TSH 0.50 07/12/2021 10:16  AM   TSH 0.552 03/02/2019 10:18 AM   FREET4 0.77 07/12/2021 10:16 AM       Latest Ref Rng & Units 07/12/2021   10:16 AM 10/29/2020   12:21 AM 10/27/2020    9:59 PM  CBC  WBC 4.0 - 10.5 K/uL 5.6   6.8   7.3    Hemoglobin 12.0 - 15.0 g/dL 13.4   12.7   13.5    Hematocrit 36.0 - 46.0 % 41.1   39.7   42.6    Platelets 150.0 - 400.0 K/uL 237.0   231   268      No results found for: VD25OH  Clinical ASCVD: No  The 10-year ASCVD risk score (Arnett DK, et al., 2019) is: 27.1%   Values used to calculate the score:     Age: 76 years     Sex: Female     Is Non-Hispanic African American: Yes     Diabetic: Yes     Tobacco smoker: No     Systolic Blood Pressure: 846 mmHg     Is BP treated: Yes     HDL Cholesterol: 59.5 mg/dL     Total Cholesterol: 213 mg/dL        07/12/2021    8:28 AM 09/17/2020    3:07 PM 09/17/2020    2:58 PM  Depression screen PHQ 2/9  Decreased Interest 0 0 0  Down, Depressed, Hopeless 0 0 0  PHQ - 2 Score 0 0 0  Altered sleeping 0    Tired, decreased energy 3    Change in appetite 0    Feeling bad or failure about yourself  0    Trouble concentrating 0    Moving slowly or fidgety/restless 0    Suicidal thoughts 0    PHQ-9 Score 3        Social History   Tobacco Use  Smoking Status Former   Packs/day: 0.25   Years: 9.00   Pack years: 2.25   Types: Cigarettes   Quit date: 05/26/1976   Years since quitting: 45.3  Smokeless Tobacco Never   BP Readings from Last 3 Encounters:  08/30/21 122/82  07/12/21 130/70  01/24/21 (!) 154/82   Pulse Readings from Last 3 Encounters:  08/30/21 69  07/12/21 (!) 55  01/24/21 62   Wt Readings from Last 3 Encounters:  08/30/21 222 lb 6.4 oz (100.9 kg)  07/12/21 222 lb 9.6 oz (101 kg)  01/24/21 224 lb 3 oz (101.7 kg)   BMI Readings from Last 3 Encounters:  08/30/21 43.43 kg/m  07/12/21 42.06 kg/m  01/24/21 42.36 kg/m    Assessment/Interventions: Review of patient past medical history, allergies, medications, health status, including review of consultants reports, laboratory and other test data, was performed as part of comprehensive evaluation and provision of chronic care management services.   SDOH:  (Social Determinants of Health) assessments and interventions performed: Yes   SDOH Screenings   Alcohol Screen: Not on file  Depression (PHQ2-9): Low Risk    PHQ-2 Score: 3  Financial Resource Strain: Low Risk    Difficulty of Paying Living Expenses: Not very hard  Food Insecurity: Not on file  Housing: Not on file  Physical Activity: Not on file  Social Connections: Not on file  Stress: Not on file  Tobacco Use: Medium Risk   Smoking Tobacco Use: Former   Smokeless Tobacco Use: Never   Passive Exposure: Not on file  Transportation Needs: No  Transportation Needs   Lack  of Transportation (Medical): No   Lack of Transportation (Non-Medical): No   Patient reports her biggest health concern right now is with her asthma. 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 Patient reports this flares up and then she is headed for the hospital and wants to avoid that. She is not taking montelukast or her maintenance inhaler regularly. She is very allergic to pollen and her allergies got significantly worse when she moved from [place] to [place] in 2017.  Patient's lives with some of her family members including her first grand daughter who she watches often. She reports this is difficult because she isn't eating the same and isn't as active as she once was. Patient is eating more frozen meals and eating out more with them around and doesn't have time for activity but really wants to get back into the pool as she enjoyed water aerobics previously.  Patient was married to a doctor, her daughter is a pediatrician and her other daughter is a PA. She reports being surrounded by medicine but gets confused on her own health/medications often.  Patient is not having current issues with her medications. Patient was having side effects with Wilder Glade and this was stopped and she never wants to consider this again.   CCM Care Plan  Allergies  Allergen Reactions   Strawberry (Diagnostic) Anaphylaxis and Other (See Comments)    hives   Farxiga [Dapagliflozin] Other (See Comments)    Had HAs, weakness, rhinorrhea, jitteriness, nasal congestion, nocturia, yeast infections, memory changes, irritability.   Penicillins Itching    Has patient had a PCN reaction causing immediate rash, facial/tongue/throat swelling, SOB or lightheadedness with hypotension: No Has patient had a PCN reaction causing severe rash involving mucus  membranes or skin necrosis: No Has patient had a PCN reaction that required hospitalization: Unknown Has patient had a PCN reaction occurring within the last 10 years: No If all of the above answers are "NO", then may proceed with Cephalosporin use.   Tape Other (See Comments)    Blisters   Gabapentin Itching, Rash and Other (See Comments)    Incoherent    Medications Reviewed Today     Reviewed by Monna Fam L, RN (Registered Nurse) on 08/30/21 at 1003  Med List Status: <None>   Medication Order Taking? Sig Documenting Provider Last Dose Status Informant  albuterol (VENTOLIN HFA) 108 (90 Base) MCG/ACT inhaler [id number] Yes Inhale 2 puffs into the lungs every 6 (six) hours as needed for wheezing or shortness of breath. [provider] Taking Active Multiple Informants  allopurinol (ZYLOPRIM) 100 MG tablet [id number] Yes Take 1 tablet (100 mg total) by mouth daily as needed (gout attacks). Geradine Girt, DO Taking Active   blood glucose meter kit and supplies KIT [id number] Yes Dispense based on patient and insurance preference. Use up to three times daily as directed. Billie Ruddy, MD Taking Active   carvedilol (COREG) 25 MG tablet [id number] Yes Take 1 tablet (25 mg total) by mouth 2 (two) times daily. Please schedule yearly appointment for future refills. Thank you Josue Hector, MD Taking Active   cholecalciferol (VITAMIN D) 25 MCG (1000 UNIT) tablet [id number] Yes Take 1,000 Units by mouth at bedtime.  [provider] Taking Active Multiple Informants  Cyanocobalamin ([facility] B12 GUMMIES PO) [id number] Yes Take 1,000 mcg by mouth daily.  [provider] Taking Active   famotidine (PEPCID) 40 MG tablet [id number] Yes Take 40 mg by mouth in the morning. [provider] Taking Active Multiple Informants  glucose blood (ONETOUCH ULTRA) test strip [id number] Yes Use to check blood sugar 4-5 times daily Billie Ruddy, MD Taking Active Multiple Informants   ipratropium-albuterol (DUONEB) 0.5-2.5 (3) MG/3ML SOLN [id number] Yes Take 3 mLs by nebulization every 6 (six) hours as needed (shortness of breath/wheezing). [provider] Taking Active Multiple Informants  levocetirizine (XYZAL) 5 MG tablet [id number] Yes Take 5 mg by mouth daily. [provider] Taking Active Multiple Informants  magnesium oxide (MAG-OX) 400 MG tablet [id number] Yes Take 400 mg by mouth as needed. [provider] Taking Active Multiple Informants  mometasone-formoterol (DULERA) 100-5 MCG/ACT AERO [id number] Yes Inhale 2 puffs into the lungs 2 (two) times daily. [provider] Taking Active   montelukast (SINGULAIR) 10 MG tablet [id number] Yes Take 1 tablet (10 mg total) by mouth daily. Billie Ruddy, MD Taking Active   Multiple Vitamins-Minerals (MULTIVITAMIN WITH MINERALS) tablet [id number] Yes Take 1 tablet by mouth daily. [provider] Taking Active   potassium chloride 20 MEQ/15ML (10%) SOLN [id number] Yes Take 15 mLs (20 mEq total) by mouth daily as needed (with each dose of torsemide). Billie Ruddy, MD Taking Active   sacubitril-valsartan [facility]) 49-51 MG [id number] Yes Take 1 tablet by mouth 2 (two) times daily. Josue Hector, MD Taking Active   torsemide (DEMADEX) 20 MG tablet [id number] Yes Take 1 tablet (20 mg total) by mouth daily as needed (swelling/fluid/weight gain). Josue Hector, MD Taking Active   vitamin C (ASCORBIC ACID) 500 MG tablet [id number] Yes Take 500 mg by mouth daily. [provider] Taking Active             Patient Active Problem List   Diagnosis Date Noted   Acute on chronic heart failure with preserved ejection fraction (HFpEF) ([place]) 10/27/2020   Hypertensive urgency 10/27/2020   Diabetes mellitus type 2 in obese ([place]) 10/27/2020   CHF (congestive heart failure) ([place]) 12/10/2018   History of recurrent UTIs 06/30/2018   Mild intermittent asthma without complication 22/97/9892    Prediabetes 06/30/2018   Environmental and seasonal allergies 06/30/2018   Diabetes ([place]) 04/04/2018   Upper respiratory infection, viral 04/04/2018   Urinary tract infection 04/04/2018   Vitamin D deficiency 08/07/2016   Morbid obesity with BMI of 40.0-44.9, adult ([place]) 02/18/2016   Extrinsic asthma without complication 11/94/1740   SOB (shortness of breath) 06/19/2015   Cubital tunnel syndrome, right 07/06/2014   Hypokalemia 08/16/2013   Diabetes mellitus type 2, uncomplicated (H[facility]) 81/44/8185   GERD (gastroesophageal reflux disease) 08/28/2011   HTN (hypertension) 08/28/2011    Immunization History  Administered Date(s) Administered   Fluad Quad(high Dose 65+) 03/07/2019, 07/12/2021   Influenza Whole 02/23/2018   Influenza, High Dose Seasonal PF 04/23/2015, 03/04/2016   Influenza, Seasonal, Injecte, Preservative Fre 03/28/2013, 05/12/2014   PFIZER(Purple Top)SARS-COV-2 Vaccination 07/21/2019, 08/16/2019, 02/29/2020   PPD Test 02/18/2016   Pneumococcal Conjugate-13 04/23/2015   Pneumococcal Polysaccharide-23 08/11/2013, 06/29/2019   Tdap 05/12/2014    Conditions to be addressed/monitored:  Hypertension, Hyperlipidemia, Diabetes, Heart Failure, GERD, and Asthma  Conditions addressed this visit: Hypertension, heart failure  There are no care plans that you recently modified to display for this patient.    Medication Assistance:  [name] obtained through [employer] medication assistance program.  Enrollment ends 05/25/22  Compliance/Adherence/Medication fill history: Care Gaps: DEXA, foot  exam, eye exam, shingrix, COVID booster Last BP - 130/70 on 07/12/2021 Last A1C - 6.8 on 07/12/2021  Star-Rating Drugs: None  Patient's preferred pharmacy is:  [id number] [address], [place] [address] Phone: [phone] Fax: [phone]  [name] -  [place], [place] - [address] [place] [address] Phone: [phone] Fax: [phone]  [facility] [address], [place] [address] [place] [address] Phone: [phone] Fax: [phone]  [id number] [place]: [phone]: [phone] Uses pill box? Yes Pt endorses 99% compliance  We discussed: Current pharmacy is preferred with insurance plan and patient is satisfied with pharmacy services Patient decided to: Continue current medication management strategy  Care Plan and Follow Up Patient Decision:  Patient agrees to Care Plan and Follow-up.  Plan: The care management team will reach out to the patient again over the next 14 days.  MJeni Salles PharmD, BPlainfieldPharmacist [place]

## 2021-09-18 NOTE — Progress Notes (Signed)
Subjective:   Beth Macias is a 71 y.o. female who presents for Medicare Annual (Subsequent) preventive examination.  Review of Systems    Virtual Visit via Telephone Note  I connected with  [name] on 09/18/21 at  3:00 PM EDT by telephone and verified that I am speaking with the correct person using two identifiers.  Location: Patient: Home Provider: Office Persons participating in the virtual visit: patient/Nurse Health Advisor   I discussed the limitations, risks, security and privacy concerns of performing an evaluation and management service by telephone and the availability of in person appointments. The patient expressed understanding and agreed to proceed.  Interactive audio and video telecommunications were attempted between this nurse and patient, however failed, due to patient having technical difficulties OR patient did not have access to video capability.  We continued and completed visit with audio only.  Some vital signs may be absent or patient reported.   Criselda Peaches, LPN  Cardiac Risk Factors include: advanced age (>5mn, >>74women);hypertension;diabetes mellitus     Objective:    Today's Vitals   09/18/21 1458  Weight: 203 lb (92.1 kg)  Height: 5' (1.524 m)   Body mass index is 39.65 kg/m.     09/18/2021    3:17 PM 10/28/2020   12:00 AM 09/17/2020    3:04 PM 10/25/2019    7:38 AM 10/21/2019    9:46 AM 12/05/2018   12:05 PM 04/07/2018    1:48 PM  Advanced Directives  Does Patient Have a Medical Advance Directive? _0  No Yes  Type of [place]aramed[place]of AP[place]ill VillageLiving will Healthcare Power of [place] will    [place] will  Does patient want to make changes to medical advance directive? No - Patient declined No - Patient declined No - Patient declined Yes (Inpatient - patient defers changing a medical advance directive and declines  information at this time)     Copy of HMinierin Chart? No - copy requested No - copy requested No - copy requested      Would patient like information on creating a medical advance directive?      No - Patient declined No - Patient declined    Current Medications (verified) Outpatient Encounter Medications as of 09/18/2021  Medication Sig   albuterol (VENTOLIN HFA) 108 (90 Base) MCG/ACT inhaler INHALE 2 PUFFS INTO THE LUNGS EVERY 6 HOURS AS NEEDED FOR WHEEZE OR SHORTNESS OF BREATH   allopurinol (ZYLOPRIM) 100 MG tablet Take 1 tablet (100 mg total) by mouth daily as needed (gout attacks).   blood glucose meter kit and supplies KIT Dispense based on patient and insurance preference. Use up to three times daily as directed.   cholecalciferol (VITAMIN D) 25 MCG (1000 UNIT) tablet Take 1,000 Units by mouth at bedtime.    Cyanocobalamin ([employer]S B12 GUMMIES PO) Take 1,000 mcg by mouth daily.   famotidine (PEPCID) 40 MG tablet Take 40 mg by mouth in the morning.   glucose blood (ONETOUCH ULTRA) test strip Use to check blood sugar 4-5 times daily   ipratropium-albuterol (DUONEB) 0.5-2.5 (3) MG/3ML SOLN Take 3 mLs by nebulization every 6 (six) hours as needed (shortness of breath/wheezing).   levocetirizine (XYZAL) 5 MG tablet Take 5 mg by mouth daily.   magnesium oxide (MAG-OX) 400 MG tablet Take 400 mg by mouth as needed.   mometasone-formoterol (DULERA) 100-5 MCG/ACT AERO Inhale 2 puffs  into the lungs 2 (two) times daily.   montelukast (SINGULAIR) 10 MG tablet Take 1 tablet (10 mg total) by mouth daily.   Multiple Vitamins-Minerals (MULTIVITAMIN WITH MINERALS) tablet Take 1 tablet by mouth daily.   nebivolol (BYSTOLIC) 5 MG tablet Take 1 tablet (5 mg total) by mouth in the morning.   potassium chloride 20 MEQ/15ML (10%) SOLN Take 15 mLs (20 mEq total) by mouth daily as needed (with each dose of torsemide).   sacubitril-valsartan (ENTRESTO) 49-51 MG Take 1 tablet by mouth 2 (two)  times daily.   torsemide (DEMADEX) 20 MG tablet Take 1 tablet (20 mg total) by mouth daily as needed (swelling/fluid/weight gain).   vitamin C (ASCORBIC ACID) 500 MG tablet Take 500 mg by mouth daily.   No facility-administered encounter medications on file as of 09/18/2021.    Allergies (verified) Strawberry (diagnostic), Farxiga [dapagliflozin], Penicillins, Tape, and Gabapentin   History: Past Medical History:  Diagnosis Date   Asthma    Blood in stool    Colon polyps    Diabetes mellitus without complication (HCC)    Diverticulitis    Fibromyalgia    GERD (gastroesophageal reflux disease)    Gout    Hypercholesteremia    Hypertension    Pneumonia    Pneumonia    UTI (urinary tract infection)    Past Surgical History:  Procedure Laterality Date   APPENDECTOMY     CESAREAN SECTION     x 3   LIPOMA EXCISION     MENISCUS REPAIR Left    OVARIAN CYST REMOVAL     OVARIAN CYST SURGERY     Family History  Problem Relation Age of Onset   Heart failure Mother    Gout Mother    Hypertension Mother    Diabetes Mother    Heart failure Father    Hypertension Father    AAA (abdominal aortic aneurysm) Father    Gout Brother    Hypertension Brother    Heart attack Brother    Colon cancer Maternal Uncle    Crohn's disease Paternal Aunt    Breast cancer Neg Hx    Esophageal cancer Neg Hx    Pancreatic cancer Neg Hx    Stomach cancer Neg Hx    Social History   Socioeconomic History   Marital status: Legally Separated    Spouse name: Not on file   Number of children: 3   Years of education: Not on file   Highest education level: Not on file  Occupational History   Occupation: Pharmacist, hospital  Tobacco Use   Smoking status: Former    Packs/day: 0.25    Years: 9.00    Pack years: 2.25    Types: Cigarettes    Quit date: 05/26/1976    Years since quitting: 45.3   Smokeless tobacco: Never  Vaping Use   Vaping Use: Never used  Substance and Sexual Activity   Alcohol use:  Never   Drug use: Never   Sexual activity: Yes  Other Topics Concern   Not on file  Social History Narrative   Not on file   Social Determinants of Health   Financial Resource Strain: Low Risk    Difficulty of Paying Living Expenses: Not hard at all  Food Insecurity: No Food Insecurity   Worried About Charity fundraiser in the Last Year: Never true   Ran Out of Food in the Last Year: Never true  Transportation Needs: No Transportation Needs   Lack of Transportation (  Medical): No   Lack of Transportation (Non-Medical): No  Physical Activity: Inactive   Days of Exercise per Week: 0 days   Minutes of Exercise per Session: 0 min  Stress: No Stress Concern Present   Feeling of Stress : Not at all  Social Connections: Unknown   Frequency of Communication with Friends and Family: More than three times a week   Frequency of Social Gatherings with Friends and Family: More than three times a week   Attends Religious Services: Never   Marine scientist or Organizations: No   Attends Music therapist: Never   Marital Status: Not on file     Clinical Intake:  Pre-visit preparation completed: NoNutrition Risk Assessment:  Has the patient had any N/V/D within the last 2 months?  No  Does the patient have any non-healing wounds?  No  Has the patient had any unintentional weight loss or weight gain?  No   Diabetes:  Is the patient diabetic?  Yes  If diabetic, was a CBG obtained today?  No  Did the patient bring in their glucometer from home?  No  How often do you monitor your CBG's? Every other day.   Financial Strains and Diabetes Management:  Are you having any financial strains with the device, your supplies or your medication? No .  Does the patient want to be seen by Chronic Care Management for management of their diabetes?  No  Would the patient like to be referred to a Nutritionist or for Diabetic Management?  No   Diabetic Exams:  Diabetic Eye Exam:  Completed Yes. Overdue for diabetic eye exam. Pt has been advised about the importance in completing this exam. A referral has been placed today. Message sent to referral coordinator for scheduling purposes. Advised pt to expect a call from office referred to regarding appt.  Diabetic Foot Exam: Completed Yes. Pt has been advised about the importance in completing this exam. Pt is scheduled for diabetic foot exam on Followed by PCP.    Pain : No/denies pain     BMI - recorded: 43.43 Nutritional Status: BMI > 30  Obese Nutritional Risks: None Diabetes: Yes CBG done?: No Did pt. bring in CBG monitor from home?: No  How often do you need to have someone help you when you read instructions, pamphlets, or other written materials from your doctor or pharmacy?: 1 - Never  Diabetic?  Yes  Interpreter Needed?: No  Information entered by :: Rolene Arbour LPN   Activities of Daily Living    09/18/2021    3:14 PM 10/28/2020   12:00 AM  In your present state of health, do you have any difficulty performing the following activities:  Hearing? 0 0  Vision? 0 0  Difficulty concentrating or making decisions? 0 0  Walking or climbing stairs? 0 0  Dressing or bathing? 0 0  Doing errands, shopping? 0 0  Preparing Food and eating ? N   Using the Toilet? N   In the past six months, have you accidently leaked urine? N   Do you have problems with loss of bowel control? N   Managing your Medications? N   Managing your Finances? N   Housekeeping or managing your Housekeeping? N     Patient Care Team: Billie Ruddy, MD as PCP - General (Family Medicine) Josue Hector, MD as PCP - Cardiology (Cardiology) Viona Gilmore, [facility] as Pharmacist (Pharmacist)  Indicate any recent Medical Services you may have  received from other than [facility] providers in the past year (date may be approximate).     Assessment:   This is a routine wellness examination for Vanda.  Hearing/Vision  screen Hearing Screening - Comments:: No hearing difficulty Vision Screening - Comments:: Wears reading  glasses. Followed by Coralie Keens  Dietary issues and exercise activities discussed: Exercise limited by: None identified   Goals Addressed               This Visit's Progress     Weight (lb) < 200 lb (90.7 kg) (pt-stated)   203 lb (92.1 kg)     I would like to lose weight and increase water activities.       Depression Screen    09/18/2021    3:11 PM 07/12/2021    8:28 AM 09/17/2020    3:07 PM 09/17/2020    2:58 PM 06/29/2019    1:52 PM  PHQ 2/9 Scores  PHQ - 2 Score 0 0 0 0 0  PHQ- 9 Score  3       Fall Risk    09/18/2021    3:15 PM 07/12/2021    8:27 AM 09/17/2020    3:06 PM  [place] in the past year? 1  0  Number falls in past yr: 0 0 0  Injury with Fall? 1 0 0  Comment Knee injury. Followed by [facility]    Risk for fall due to : No Fall Risks No Fall Risks   Follow up  Falls evaluation completed Falls evaluation completed    [place]:  Any stairs in or around the home? Yes  If so, are there any without handrails? No  Home free of loose throw rugs in walkways, pet beds, electrical cords, etc? Yes  Adequate lighting in your home to reduce risk of falls? Yes   ASSISTIVE DEVICES UTILIZED TO PREVENT FALLS:  Life alert? No  Use of a cane, walker or w/c? Yes  Grab bars in the bathroom? Yes  Shower chair or bench in shower? No  Elevated toilet seat or a handicapped toilet? No   TIMED UP AND GO:  Was the test performed? No . Audio Visit   Cognitive Function:      09/18/2021    3:18 PM  6CIT Screen  What Year? 0 points  What month? 0 points  What time? 0 points  Count back from 20 0 points  Months in reverse 0 points  Repeat phrase 0 points  Total Score 0 points    Immunizations Immunization History  Administered Date(s) Administered   Fluad Quad(high Dose 65+) 03/07/2019, 07/12/2021    Influenza Whole 02/23/2018   Influenza, High Dose Seasonal PF 04/23/2015, 03/04/2016   Influenza, Seasonal, Injecte, Preservative Fre 03/28/2013, 05/12/2014   PFIZER(Purple Top)SARS-COV-2 Vaccination 07/21/2019, 08/16/2019, 02/29/2020   PPD Test 02/18/2016   Pneumococcal Conjugate-13 04/23/2015   Pneumococcal Polysaccharide-23 08/11/2013, 06/29/2019   Tdap 05/12/2014    TDAP status: Up to date  Flu Vaccine status: Up to date  Pneumococcal vaccine status: Up to date  Covid-19 vaccine status: Completed vaccines  Qualifies for Shingles Vaccine? Yes   Zostavax completed No   Shingrix Completed?: No.    Education has been provided regarding the importance of this vaccine. Patient has been advised to call insurance company to determine out of pocket expense if they have not yet received this vaccine. Advised may also receive vaccine at local pharmacy or  Health Dept. Verbalized acceptance and understanding.  Screening Tests Health Maintenance  Topic Date Due   DEXA SCAN  Never done   FOOT EXAM  01/09/2022 (Originally 11/15/1960)   OPHTHALMOLOGY EXAM  01/09/2022 (Originally 11/22/2020)   COVID-19 Vaccine (4 - Booster for Pfizer series) 01/09/2022 (Originally 04/25/2020)   Zoster Vaccines- Shingrix (1 of 2) 01/09/2022 (Originally 11/14/1969)   INFLUENZA VACCINE  12/24/2021   HEMOGLOBIN A1C  01/09/2022   MAMMOGRAM  07/27/2022   TETANUS/TDAP  05/12/2024   COLONOSCOPY (Pts 45-86yr Insurance coverage will need to be confirmed)  04/29/2028   Pneumonia Vaccine 71 Years old  Completed   Hepatitis C Screening  Completed   HPV VACCINES  Aged Out    Health Maintenance  Health Maintenance Due  Topic Date Due   DEXA SCAN  Never done    Colorectal cancer screening: Type of screening: Colonoscopy. Completed 04/29/18. Repeat every 10 years  Mammogram status: Completed 07/26/21. Repeat every year   Lung Cancer Screening: (Low Dose CT Chest recommended if Age 697-80years, 30 pack-year currently  smoking OR have quit w/in 15years.) does not qualify.     Additional Screening:  Hepatitis C Screening: does qualify; Completed 07/12/21  Vision Screening: Recommended annual ophthalmology exams for early detection of glaucoma and other disorders of the eye. Is the patient up to date with their annual eye exam?  Yes  Who is the provider or what is the name of the [facility] which the patient [facility]ends annual [facility] exams[facility] is not established with a provider, would they like to be referred to a provider to establish care? No .   Dental Screening: Recommended annual dental exams for proper oral hygiene  Community Resource Referral / Chronic Care Management:  CRR required this visit?  No   CCM required this visit?  No      Plan:     I have personally reviewed and noted the following in the patient's chart:   Medical and social history Use of alcohol, tobacco or illicit drugs  Current medications and supplements including opioid prescriptions.  Functional ability and status Nutritional status Physical activity Advanced directives List of other physicians Hospitalizations, surgeries, and ER visits in previous 12 months Vitals Screenings to include cognitive, depression, and falls Referrals and appointments  In addition, I have reviewed and discussed with patient certain preventive protocols, quality metrics, and best practice recommendations. A written personalized care plan for preventive services as well as general preventive health recommendations were provided to patient.     BCriselda Peaches LPN   [id number]  Nurse Notes: None

## 2021-09-19 DIAGNOSIS — M6281 Muscle weakness (generalized): Secondary | ICD-10-CM | POA: Diagnosis not present

## 2021-09-19 DIAGNOSIS — M5416 Radiculopathy, lumbar region: Secondary | ICD-10-CM | POA: Diagnosis not present

## 2021-09-19 DIAGNOSIS — R269 Unspecified abnormalities of gait and mobility: Secondary | ICD-10-CM | POA: Diagnosis not present

## 2021-09-22 DIAGNOSIS — I5032 Chronic diastolic (congestive) heart failure: Secondary | ICD-10-CM

## 2021-09-22 DIAGNOSIS — Z87891 Personal history of nicotine dependence: Secondary | ICD-10-CM

## 2021-09-22 DIAGNOSIS — I110 Hypertensive heart disease with heart failure: Secondary | ICD-10-CM

## 2021-09-22 DIAGNOSIS — E119 Type 2 diabetes mellitus without complications: Secondary | ICD-10-CM

## 2021-09-25 DIAGNOSIS — M5416 Radiculopathy, lumbar region: Secondary | ICD-10-CM | POA: Diagnosis not present

## 2021-09-25 DIAGNOSIS — M6281 Muscle weakness (generalized): Secondary | ICD-10-CM | POA: Diagnosis not present

## 2021-09-25 DIAGNOSIS — R269 Unspecified abnormalities of gait and mobility: Secondary | ICD-10-CM | POA: Diagnosis not present

## 2021-09-26 ENCOUNTER — Encounter: Payer: Medicare (Managed Care) | Attending: Family Medicine | Admitting: Dietician

## 2021-09-26 ENCOUNTER — Ambulatory Visit (INDEPENDENT_AMBULATORY_CARE_PROVIDER_SITE_OTHER)
Admission: RE | Admit: 2021-09-26 | Discharge: 2021-09-26 | Disposition: A | Discharge status: Home / Self Care | Payer: Medicare (Managed Care) | Source: Ambulatory Visit | Attending: Family Medicine | Admitting: Family Medicine

## 2021-09-26 ENCOUNTER — Telehealth: Payer: Self-pay | Admitting: Dietician

## 2021-09-26 ENCOUNTER — Encounter: Payer: Self-pay | Admitting: Dietician

## 2021-09-26 DIAGNOSIS — Z780 Asymptomatic menopausal state: Secondary | ICD-10-CM | POA: Diagnosis not present

## 2021-09-26 DIAGNOSIS — I5022 Chronic systolic (congestive) heart failure: Secondary | ICD-10-CM | POA: Insufficient documentation

## 2021-09-26 DIAGNOSIS — E119 Type 2 diabetes mellitus without complications: Secondary | ICD-10-CM | POA: Insufficient documentation

## 2021-09-26 DIAGNOSIS — I5020 Unspecified systolic (congestive) heart failure: Secondary | ICD-10-CM

## 2021-09-26 DIAGNOSIS — I110 Hypertensive heart disease with heart failure: Secondary | ICD-10-CM | POA: Insufficient documentation

## 2021-09-26 DIAGNOSIS — E669 Obesity, unspecified: Secondary | ICD-10-CM

## 2021-09-26 NOTE — Patient Instructions (Addendum)
[facility] at [facility] [phone] ?Consider ways to be more active.   ? ?Look up Chia seed pudding or simple add 2 T chia seeds to 1/2 cup almond milk and refrigerate overnight ? ?Mindfulness: ? Choices ? Eat slowly away from distraction ? Listen to your body.  When are you satisfied? ? Are you really hungry for the snack or eating for another reason? ? ?

## 2021-09-26 NOTE — Progress Notes (Signed)
Patient is here today alone. Patient was referred for Type 2 Diabetes and CHF She states that she had education in the past but would like a refresher.  She would like to discuss what foods to avoid for better health.   Diabetes Self-Management Education  Visit Type: First/Initial  Appt. Start Time: 1500 Appt. End Time: 1645  09/26/2021  [name], identified by name and date of birth, is a 71 y.o. female with a diagnosis of Diabetes: Type 2.   ASSESSMENT She is currently in PT since an injury after a fall. She babysat her grandchild for about 19 months until about 2 weeks ago. She states that she got into some bad habits with diet while her daughter, son-in-law and grandchild were living with her and did not take good care of herself.  History includes Type 2 Diabetes, HLD, HTN, GERD, CHF Labs noted to include:  A1C 6.8% 07/12/2021 and cholesterol 213, HDL 59, LDL 127, Triglycerides 135 on 07/12/2021. Medications include Torsemide prn, potassium when she takes torsemide, vitamin B-12, vitamin D, Vitamin C, and magnesium  Weight hx: 223 lbs 09/26/2021 Weight overall is stable Goal <200 lbs Lowest adult weight 135 lbs and was this weight for years and gained a lot after pregnancy.  Patient lives with her daughter.  They share shopping and cooking. She has a small garden. She moved from [place] in 2017 and states that she used a pool there and stated that she had a healthier life. Pecan allergy, avoids pork, avoids added salt and uses a small amount in cooking. She is a retired Biochemist, clinical. Emotional eater  Height 5' 1"$  (1.549 m), weight 223 lb (101.2 kg). Body mass index is 42.14 kg/m.   Diabetes Self-Management Education - 09/26/21 1550       Visit Information   Visit Type First/Initial      Initial Visit   Diabetes Type Type 2    Date Diagnosed 2000    Are you currently following a meal plan? No    Are you taking your medications as prescribed? Not on  Medications      Health Coping   How would you rate your overall health? Fair      Psychosocial Assessment   Patient Belief/Attitude about Diabetes Motivated to manage diabetes    What is the hardest part about your diabetes right now, causing you the most concern, or is the most worrisome to you about your diabetes?   Making healty food and beverage choices    Self-care barriers None    Self-management support Doctor's office    Other persons present Patient    Patient Concerns Nutrition/Meal planning;Healthy Lifestyle    Special Needs None    Preferred Learning Style No preference indicated    Learning Readiness Ready    How often do you need to have someone help you when you read instructions, pamphlets, or other written materials from your doctor or pharmacy? 1 - Never    What is the last grade level you completed in school? 4 years college      Pre-Education Assessment   Patient understands the diabetes disease and treatment process. Needs Review    Patient understands incorporating nutritional management into lifestyle. Needs Review    Patient undertands incorporating physical activity into lifestyle. Needs Review    Patient understands using medications safely. Needs Review    Patient understands monitoring blood glucose, interpreting and using results Needs Review    Patient understands prevention, detection, and treatment  of acute complications. Needs Review    Patient understands prevention, detection, and treatment of chronic complications. Needs Review    Patient understands how to develop strategies to address psychosocial issues. Needs Review    Patient understands how to develop strategies to promote health/change behavior. Needs Review      Complications   Last HgB A1C per patient/outside source 6.8 %   07/12/2021   How often do you check your blood sugar? 1-2 times/day    Fasting Blood glucose range (mg/dL) 70-129;130-179   135-150   Number of hyperglycemic episodes (  >235m/dL): Rare    Can you tell when your blood sugar is high? Yes    What do you do if your blood sugar is high? changes her behavior    Have you had a dilated eye exam in the past 12 months? Yes    Have you had a dental exam in the past 12 months? Yes      Dietary Intake   Breakfast plain or sweetened cheerios with berries, 2% milk OR boiled egg, 1 slice dry WPacific Mutualtoast or with 1 tsp jelly OR oatmeal with honey, cranberries and nuts, chia seeds    Snack (morning) none    Lunch yogurt, chia seeds and fruit    Snack (afternoon) raw vegetable    Dinner meat (chicken and fish), 2 vegetables, occasional potato    Snack (evening) 1/2 cup unsalted or raw nuts while watching TV    Beverage(s) water, lemon, ginger, turmeric tea with honey, spakling water      Activity / Exercise   Activity / Exercise Type ADL's   PT   How many days per week do you exercise? 2      Patient Education   Previous Diabetes Education Yes (please comment)   20 years ago [place] 1015 [place] the [place] of diabetes and the relationship between the three main macronutrients and blood glucose level;Meal options for control of blood glucose level and chronic complications.    Being Active Role of exercise on diabetes management, blood pressure control and cardiac health.;Helped patient identify appropriate exercises in relation to his/her diabetes, diabetes complications and other health issue.    Monitoring Other (comment)   current blood glucose range   Diabetes Stress and Support Identified and addressed patients feelings and concerns about diabetes;Worked with patient to identify barriers to care and solutions      Individualized Goals (developed by patient)   Nutrition General guidelines for healthy choices and portions discussed    Physical Activity Exercise 3-5 times per week;30 minutes per day    Medications take my medication as prescribed    Monitoring  Test my blood  glucose as discussed    Problem Solving Eating Pattern      Post-Education Assessment   Patient understands the diabetes disease and treatment process. Demonstrates understanding / competency    Patient understands incorporating nutritional management into lifestyle. Demonstrates understanding / competency    Patient undertands incorporating physical activity into lifestyle. Demonstrates understanding / competency    Patient understands using medications safely. Demonstrates understanding / competency    Patient understands monitoring blood glucose, interpreting and using results Demonstrates understanding / competency    Patient understands prevention, detection, and treatment of acute complications. Demonstrates understanding / competency    Patient understands prevention, detection, and treatment of chronic complications. Demonstrates understanding / competency    Patient understands how to develop strategies  to address psychosocial issues. Demonstrates understanding / competency    Patient understands how to develop strategies to promote health/change behavior. Comprehends key points      Outcomes   Expected Outcomes Demonstrated interest in learning. Expect positive outcomes    Future DMSE 3-4 months    Program Status Not Completed             Individualized Plan for Diabetes Self-Management Training:   Learning Objective:  Patient will have a greater understanding of diabetes self-management. Patient education plan is to attend individual and/or group sessions per assessed needs and concerns.   Plan:   Patient Instructions  [place] [facility] [place] [phone] Consider ways to be more active.    Look up Chia seed pudding or simple add 2 T chia seeds to 1/2 cup almond milk and refrigerate overnight  Mindfulness:  Choices  Eat slowly away from distraction  Listen to your body.  When are you satisfied?  Are you really hungry for the snack or eating for  another reason?   Expected Outcomes:  Demonstrated interest in learning. Expect positive outcomes  Education material provided: ADA - How to Thrive: A Guide for Your Journey with Diabetes, Meal plan card, and Diabetes Resources, heart failure nutrition therapy, low purine nutrition therapy  If problems or questions, patient to contact team via:  Phone  Future DSME appointment: 3-4 months

## 2021-09-27 DIAGNOSIS — R269 Unspecified abnormalities of gait and mobility: Secondary | ICD-10-CM | POA: Diagnosis not present

## 2021-09-27 DIAGNOSIS — M5416 Radiculopathy, lumbar region: Secondary | ICD-10-CM | POA: Diagnosis not present

## 2021-09-27 DIAGNOSIS — M6281 Muscle weakness (generalized): Secondary | ICD-10-CM | POA: Diagnosis not present

## 2021-10-01 DIAGNOSIS — M6281 Muscle weakness (generalized): Secondary | ICD-10-CM | POA: Diagnosis not present

## 2021-10-01 DIAGNOSIS — M5416 Radiculopathy, lumbar region: Secondary | ICD-10-CM | POA: Diagnosis not present

## 2021-10-01 DIAGNOSIS — R269 Unspecified abnormalities of gait and mobility: Secondary | ICD-10-CM | POA: Diagnosis not present

## 2021-10-03 DIAGNOSIS — R269 Unspecified abnormalities of gait and mobility: Secondary | ICD-10-CM | POA: Diagnosis not present

## 2021-10-03 DIAGNOSIS — M6281 Muscle weakness (generalized): Secondary | ICD-10-CM | POA: Diagnosis not present

## 2021-10-03 DIAGNOSIS — M5416 Radiculopathy, lumbar region: Secondary | ICD-10-CM | POA: Diagnosis not present

## 2021-10-08 DIAGNOSIS — R269 Unspecified abnormalities of gait and mobility: Secondary | ICD-10-CM | POA: Diagnosis not present

## 2021-10-08 DIAGNOSIS — M6281 Muscle weakness (generalized): Secondary | ICD-10-CM | POA: Diagnosis not present

## 2021-10-08 DIAGNOSIS — M5416 Radiculopathy, lumbar region: Secondary | ICD-10-CM | POA: Diagnosis not present

## 2021-10-09 ENCOUNTER — Ambulatory Visit (INDEPENDENT_AMBULATORY_CARE_PROVIDER_SITE_OTHER): Payer: Medicare (Managed Care) | Admitting: Podiatry

## 2021-10-09 DIAGNOSIS — E119 Type 2 diabetes mellitus without complications: Secondary | ICD-10-CM

## 2021-10-09 DIAGNOSIS — M79676 Pain in unspecified toe(s): Secondary | ICD-10-CM

## 2021-10-09 DIAGNOSIS — M79675 Pain in left toe(s): Secondary | ICD-10-CM

## 2021-10-09 DIAGNOSIS — M79674 Pain in right toe(s): Secondary | ICD-10-CM | POA: Diagnosis not present

## 2021-10-09 DIAGNOSIS — B351 Tinea unguium: Secondary | ICD-10-CM

## 2021-10-09 NOTE — Progress Notes (Signed)
She presents today chief complaint of painful elongated toenails. ? ?Objective: Pulses are strong and palpable.  No change in neurologic sensorium deep tendon reflexes are intact toenails are long thick yellow dystrophic clinically mycotic. ? ?Assessment: Diabetes mellitus without complications.  Pain in limb secondary to onychomycosis. ? ?Plan: Debridement of toenails 1 through 5 bilateral. ?

## 2021-10-10 DIAGNOSIS — M6281 Muscle weakness (generalized): Secondary | ICD-10-CM | POA: Diagnosis not present

## 2021-10-10 DIAGNOSIS — R269 Unspecified abnormalities of gait and mobility: Secondary | ICD-10-CM | POA: Diagnosis not present

## 2021-10-10 DIAGNOSIS — M5416 Radiculopathy, lumbar region: Secondary | ICD-10-CM | POA: Diagnosis not present

## 2021-10-11 ENCOUNTER — Telehealth: Payer: Self-pay | Admitting: Pharmacist

## 2021-10-11 NOTE — Chronic Care Management (AMB) (Signed)
Chronic Care Management Pharmacy Assistant   Name: Beth Macias  MRN: [id number] DOB: 1950/11/18  Reason for Encounter: Disease State / Hypertension Assessment Call   Conditions to be addressed/monitored: HTN   Recent office visits:  09/18/2021 Rolene Arbour LPN - Medicare annual wellness exam  Recent consult visits:  10/09/2021 Max Hyatt DPM (podiatry) - Patient was seen for Type 2 diabetes mellitus without complication, without long-term current use of insulin and an additional issue. No medication changes. Follow up in 3 months.   09/26/2021 Antonieta Iba RD - Patient was seen for Systolic congestive heart failure, unspecified HF chronicity and an additional issues. No medication changes. Follow up 3-4 months.   Hospital visits:  None  Medications: Outpatient Encounter Medications as of 10/11/2021  Medication Sig   albuterol (VENTOLIN HFA) 108 (90 Base) MCG/ACT inhaler INHALE 2 PUFFS INTO THE LUNGS EVERY 6 HOURS AS NEEDED FOR WHEEZE OR SHORTNESS OF BREATH   allopurinol (ZYLOPRIM) 100 MG tablet Take 1 tablet (100 mg total) by mouth daily as needed (gout attacks).   blood glucose meter kit and supplies KIT Dispense based on patient and insurance preference. Use up to three times daily as directed.   cholecalciferol (VITAMIN D) 25 MCG (1000 UNIT) tablet Take 1,000 Units by mouth at bedtime.    Cyanocobalamin ([employer] B12 GUMMIES PO) Take 1,000 mcg by mouth daily.   famotidine (PEPCID) 40 MG tablet Take 40 mg by mouth in the morning.   glucose blood (ONETOUCH ULTRA) test strip Use to check blood sugar 4-5 times daily   ipratropium-albuterol (DUONEB) 0.5-2.5 (3) MG/3ML SOLN Take 3 mLs by nebulization every 6 (six) hours as needed (shortness of breath/wheezing).   levocetirizine (XYZAL) 5 MG tablet Take 5 mg by mouth daily.   magnesium oxide (MAG-OX) 400 MG tablet Take 400 mg by mouth as needed.   mometasone-formoterol (DULERA) 100-5 MCG/ACT AERO Inhale 2 puffs into the lungs 2  (two) times daily.   montelukast (SINGULAIR) 10 MG tablet Take 1 tablet (10 mg total) by mouth daily.   Multiple Vitamins-Minerals (MULTIVITAMIN WITH MINERALS) tablet Take 1 tablet by mouth daily. (Patient not taking: Reported on 09/26/2021)   nebivolol (BYSTOLIC) 5 MG tablet Take 1 tablet (5 mg total) by mouth in the morning.   potassium chloride 20 MEQ/15ML (10%) SOLN Take 15 mLs (20 mEq total) by mouth daily as needed (with each dose of torsemide).   sacubitril-valsartan (ENTRESTO) 49-51 MG Take 1 tablet by mouth 2 (two) times daily.   torsemide (DEMADEX) 20 MG tablet Take 1 tablet (20 mg total) by mouth daily as needed (swelling/fluid/weight gain).   vitamin C (ASCORBIC ACID) 500 MG tablet Take 500 mg by mouth daily.   No facility-administered encounter medications on file as of 10/11/2021.  Fill History: ALBUTEROL HFA 90 MCG INHALER 10/01/2021 30   FAMOTIDINE 40 MG TABLET 07/31/2021 90   IPRATROPI/ALB 0.5/3MG INH SL 30X3ML 10/25/2020 11   DULERA 100 MCG-5 MCG INHALER 09/06/2021 30   MONTELUKAST 10MG TABLETS 09/10/2021 90   NEBIVOLOL 5MG TABLETS 09/18/2021 90   POTASSIUM CL 10% (20 MEQ/15ML) 07/12/2021 31   ENTRESTO 49 MG-51 MG TABLET 06/05/2021 90   TORSEMIDE 20 MG TABLET 06/05/2021 90   Reviewed chart prior to disease state call. Spoke with patient regarding BP  Recent Office Vitals: BP Readings from Last 3 Encounters:  08/30/21 122/82  07/12/21 130/70  01/24/21 (!) 154/82   Pulse Readings from Last 3 Encounters:  08/30/21 69  07/12/21 (!) 55  01/24/21 62    Wt Readings from Last 3 Encounters:  09/26/21 223 lb (101.2 kg)  09/18/21 203 lb (92.1 kg)  08/30/21 222 lb 6.4 oz (100.9 kg)     Kidney Function Lab Results  Component Value Date/Time   CREATININE 0.70 07/12/2021 10:16 AM   CREATININE 1.01 (H) 11/06/2020 11:14 AM   GFR 86.87 07/12/2021 10:16 AM   GFRNONAA 60 (L) 11/06/2020 11:14 AM   GFRAA 86 02/29/2020 10:10 AM       Latest Ref Rng & Units 07/12/2021    10:16 AM 11/06/2020   11:14 AM 10/29/2020   12:21 AM  BMP  Glucose 70 - 99 mg/dL 130   124   206    BUN 6 - 23 mg/dL _0 Creatinine 0.40 - 1.20 mg/dL 0.70   1.01   0.90    Sodium 135 - 145 mEq/L 141   137   138    Potassium 3.5 - 5.1 mEq/L 3.4   3.2   3.3    Chloride 96 - 112 mEq/L 101   94   99    CO2 19 - 32 mEq/L 36   33   29    Calcium 8.4 - 10.5 mg/dL 10.3   9.8   9.5      Current antihypertensive regimen:  Entresto 49/51 mg twice daily Bystolic 5 mg every morning  How often are you checking your Blood Pressure?  How are your blood pressures since the change to Bystolic?  Spoke with patient, she states she made the change to Bystolic one week ago. She has not been able to check her blood pressure as her machine is not working.  Patient plans to go to her pharmacy 3 times per week and keeping a record. Patient aware I will check back with her in a month.   Current home BP readings: No current readings.   What recent interventions/DTPs have been made by any provider to improve Blood Pressure control since last CPP Visit: Patient was to switch from Carvedilol to Bystolic  Any recent hospitalizations or ED visits since last visit with CPP? No recent hospital visits  What diet changes have been made to improve Blood Pressure Control?  Patient has recently been to see a Nutritionist.   Adherence Review: Is the patient currently on ACE/ARB medication? No Does the patient have >5 day gap between last estimated fill dates? No  Care Gaps: AWV - scheduled 09/22/2022 Last BP - 122/82 on 08/30/2021 Last A1C - 6.8 on 07/12/2021  Star Rating Drugs: None  [place]  Clinical Pharmacist Assistant [phone]

## 2021-10-14 NOTE — Assessment & Plan Note (Signed)
Asthma symptoms flared during spring while off Dulera and Singulair. Does not appear acutely exacerbated today. Last hospitalized in June 2022 for asthma.   Recommendations: - Restart Dulera 136mg- Take two puffs every morning and evening (rinse mouth after use) - Use Albuterol rescue inhaler 2 puffs every 4-6 hours as needed only for breakthrough shortness of breath/wheezing - Take Singulair '10mg'$  at bedtime every day  - Take mucinex twice a day as needed for chest/congestion   Follow-up: - 6 months with Dr. SHalford Chessmanor BEustaquio MaizeNP

## 2021-10-16 DIAGNOSIS — R269 Unspecified abnormalities of gait and mobility: Secondary | ICD-10-CM | POA: Diagnosis not present

## 2021-10-16 DIAGNOSIS — M6281 Muscle weakness (generalized): Secondary | ICD-10-CM | POA: Diagnosis not present

## 2021-10-16 DIAGNOSIS — M5416 Radiculopathy, lumbar region: Secondary | ICD-10-CM | POA: Diagnosis not present

## 2021-10-17 DIAGNOSIS — R269 Unspecified abnormalities of gait and mobility: Secondary | ICD-10-CM | POA: Diagnosis not present

## 2021-10-17 DIAGNOSIS — M5416 Radiculopathy, lumbar region: Secondary | ICD-10-CM | POA: Diagnosis not present

## 2021-10-17 DIAGNOSIS — M6281 Muscle weakness (generalized): Secondary | ICD-10-CM | POA: Diagnosis not present

## 2021-10-24 DIAGNOSIS — M6281 Muscle weakness (generalized): Secondary | ICD-10-CM | POA: Diagnosis not present

## 2021-10-24 DIAGNOSIS — M5416 Radiculopathy, lumbar region: Secondary | ICD-10-CM | POA: Diagnosis not present

## 2021-10-24 DIAGNOSIS — R269 Unspecified abnormalities of gait and mobility: Secondary | ICD-10-CM | POA: Diagnosis not present

## 2021-10-29 DIAGNOSIS — M5416 Radiculopathy, lumbar region: Secondary | ICD-10-CM | POA: Diagnosis not present

## 2021-10-29 DIAGNOSIS — M6281 Muscle weakness (generalized): Secondary | ICD-10-CM | POA: Diagnosis not present

## 2021-10-29 DIAGNOSIS — R269 Unspecified abnormalities of gait and mobility: Secondary | ICD-10-CM | POA: Diagnosis not present

## 2021-11-01 DIAGNOSIS — M79645 Pain in left finger(s): Secondary | ICD-10-CM | POA: Diagnosis not present

## 2021-11-01 DIAGNOSIS — M79644 Pain in right finger(s): Secondary | ICD-10-CM | POA: Diagnosis not present

## 2021-11-06 DIAGNOSIS — M5416 Radiculopathy, lumbar region: Secondary | ICD-10-CM | POA: Diagnosis not present

## 2021-11-06 DIAGNOSIS — M6281 Muscle weakness (generalized): Secondary | ICD-10-CM | POA: Diagnosis not present

## 2021-11-06 DIAGNOSIS — R269 Unspecified abnormalities of gait and mobility: Secondary | ICD-10-CM | POA: Diagnosis not present

## 2021-11-08 DIAGNOSIS — R269 Unspecified abnormalities of gait and mobility: Secondary | ICD-10-CM | POA: Diagnosis not present

## 2021-11-08 DIAGNOSIS — M6281 Muscle weakness (generalized): Secondary | ICD-10-CM | POA: Diagnosis not present

## 2021-11-08 DIAGNOSIS — M5416 Radiculopathy, lumbar region: Secondary | ICD-10-CM | POA: Diagnosis not present

## 2021-11-12 DIAGNOSIS — M6281 Muscle weakness (generalized): Secondary | ICD-10-CM | POA: Diagnosis not present

## 2021-11-12 DIAGNOSIS — M5416 Radiculopathy, lumbar region: Secondary | ICD-10-CM | POA: Diagnosis not present

## 2021-11-12 DIAGNOSIS — R269 Unspecified abnormalities of gait and mobility: Secondary | ICD-10-CM | POA: Diagnosis not present

## 2021-11-14 DIAGNOSIS — M5416 Radiculopathy, lumbar region: Secondary | ICD-10-CM | POA: Diagnosis not present

## 2021-11-14 DIAGNOSIS — R269 Unspecified abnormalities of gait and mobility: Secondary | ICD-10-CM | POA: Diagnosis not present

## 2021-11-14 DIAGNOSIS — M6281 Muscle weakness (generalized): Secondary | ICD-10-CM | POA: Diagnosis not present

## 2021-11-14 NOTE — Chronic Care Management (AMB) (Signed)
Followed up with patient, she states she hasn't had a chance to check any blood pressures, she states she has had multiple physical therapy appointments and is currently taking prednisone.  She states she has just received a blood pressure monitor and plans to start checking her blood pressures at home when she is finished with the prednisone.

## 2021-11-19 DIAGNOSIS — M5416 Radiculopathy, lumbar region: Secondary | ICD-10-CM | POA: Diagnosis not present

## 2021-11-19 DIAGNOSIS — R269 Unspecified abnormalities of gait and mobility: Secondary | ICD-10-CM | POA: Diagnosis not present

## 2021-11-19 DIAGNOSIS — M6281 Muscle weakness (generalized): Secondary | ICD-10-CM | POA: Diagnosis not present

## 2021-11-21 DIAGNOSIS — M19041 Primary osteoarthritis, right hand: Secondary | ICD-10-CM | POA: Diagnosis not present

## 2021-11-21 DIAGNOSIS — M79642 Pain in left hand: Secondary | ICD-10-CM | POA: Diagnosis not present

## 2021-11-21 DIAGNOSIS — M79641 Pain in right hand: Secondary | ICD-10-CM | POA: Diagnosis not present

## 2021-11-21 DIAGNOSIS — M19042 Primary osteoarthritis, left hand: Secondary | ICD-10-CM | POA: Diagnosis not present

## 2021-11-29 ENCOUNTER — Telehealth: Payer: Self-pay | Admitting: Family Medicine

## 2021-11-29 DIAGNOSIS — E876 Hypokalemia: Secondary | ICD-10-CM

## 2021-11-29 MED ORDER — POTASSIUM CHLORIDE 20 MEQ/15ML (10%) PO SOLN: 20.0000 meq | Freq: Every day | ORAL | 0 refills | Status: DC | PRN

## 2021-11-29 NOTE — Telephone Encounter (Signed)
Pt requesting a refill of potassium chloride 20 MEQ/15ML (10%) SOLN [facility] - [address], [place] Phone:  [phone]  Fax:  [phone]

## 2021-11-29 NOTE — Telephone Encounter (Signed)
Request Rx sent to [facility]

## 2021-12-13 DIAGNOSIS — Z20822 Contact with and (suspected) exposure to covid-19: Secondary | ICD-10-CM | POA: Diagnosis not present

## 2021-12-13 DIAGNOSIS — R0989 Other specified symptoms and signs involving the circulatory and respiratory systems: Secondary | ICD-10-CM | POA: Diagnosis not present

## 2021-12-26 ENCOUNTER — Encounter: Payer: Medicare (Managed Care) | Attending: Family Medicine | Admitting: Dietician

## 2021-12-26 DIAGNOSIS — E1169 Type 2 diabetes mellitus with other specified complication: Secondary | ICD-10-CM | POA: Diagnosis not present

## 2021-12-26 DIAGNOSIS — E669 Obesity, unspecified: Secondary | ICD-10-CM | POA: Insufficient documentation

## 2021-12-26 NOTE — Progress Notes (Signed)
Diabetes Self-Management Education  Visit Type: Follow-up  Appt. Start Time: 1405 Appt. End Time: 6295  12/27/2021  [name], identified by name and date of birth, is a 71 y.o. female with a diagnosis of Diabetes: Type 2.   ASSESSMENT Patient is here today alone.  She was last seen by this RD on 09/26/2021.  She has visited [facility] and likes this a lot.  She is considering joining. She has finished PT in July.  She is doing gardening.  She is going up 3 flights of stairs at times. Her family moved out. She has decreased stress.  She is relaxing more and eating better.  She is working at gradual behavioral change.  Cooking smaller meals and is satisfied with this. She is having a gout flair - discussed a low purine diet.  History includes Type 2 Diabetes, HLD, HTN, GERD, CHF Labs noted to include:  A1C 6.8% 07/12/2021 and cholesterol 213, HDL 59, LDL 127, Triglycerides 135 on 07/12/2021. Medications include Torsemide prn, potassium when she takes torsemide, vitamin B-12, vitamin D, Vitamin C, and magnesium   Weight hx: 223 lbs 09/26/2021 Weight overall is stable Goal <200 lbs Lowest adult weight 135 lbs and was this weight for years and gained a lot after pregnancy.   Patient lives with her daughter.  They share shopping and cooking. She has a small garden. She moved from [place] in 2017 and states that she used a pool there and stated that she had a healthier life. Pecan allergy, avoids pork, avoids added salt and uses a small amount in cooking. She is a retired Biochemist, clinical. Emotional eater    Diabetes Self-Management Education - 12/27/21 1938       Visit Information   Visit Type Follow-up      Initial Visit   Diabetes Type Type 2    Are you taking your medications as prescribed? Yes      Psychosocial Assessment   Patient Belief/Attitude about Diabetes Motivated to manage diabetes    Other persons present Patient      Pre-Education Assessment    Patient understands the diabetes disease and treatment process. Demonstrates understanding / competency    Patient understands incorporating nutritional management into lifestyle. Comprehends key points    Patient undertands incorporating physical activity into lifestyle. Comprehends key points    Patient understands using medications safely. Demonstrates understanding / competency    Patient understands monitoring blood glucose, interpreting and using results Demonstrates understanding / competency    Patient understands prevention, detection, and treatment of acute complications. Demonstrates understanding / competency    Patient understands prevention, detection, and treatment of chronic complications. Demonstrates understanding / competency    Patient understands how to develop strategies to address psychosocial issues. Demonstrates understanding / competency    Patient understands how to develop strategies to promote health/change behavior. Comprehends key points      Complications   Number of hyperglycemic episodes ( >'200mg'$ /dL): Never      Dietary Intake   Breakfast oatmeal with raisins, nuts, seeds, 2% milk    Lunch cherry smoothie (cherries, strawberries, pineapple ice)    Dinner broiled chicken, tomato, tangerine    Beverage(s) water, lemon ginger tumeric tea with honey chia seeds, spakling water      Activity / Exercise   Activity / Exercise Type Light (walking / raking leaves)    How many days per week do you exercise? 2      Patient Education  Previous Diabetes Education Yes (please comment)    Healthy Eating Meal options for control of blood glucose level and chronic complications.    Being Active Helped patient identify appropriate exercises in relation to his/her diabetes, diabetes complications and other health issue.    Diabetes Stress and Support Identified and addressed patients feelings and concerns about diabetes      Individualized Goals (developed by patient)    Nutrition General guidelines for healthy choices and portions discussed    Physical Activity Exercise 3-5 times per week;30 minutes per day    Medications take my medication as prescribed    Monitoring  Test my blood glucose as discussed    Problem Solving Eating Pattern      Patient Self-Evaluation of Goals - Patient rates self as meeting previously set goals (% of time)   Nutrition >75% (most of the time)    Physical Activity 25 - 50% (sometimes)    Medications >75% (most of the time)    Monitoring Not Applicable    Problem Solving and behavior change strategies  50 - 75 % (half of the time)    Reducing Risk (treating acute and chronic complications) 50 - 75 % (half of the time)    Health Coping >75% (most of the time)      Post-Education Assessment   Patient understands the diabetes disease and treatment process. Demonstrates understanding / competency    Patient understands incorporating nutritional management into lifestyle. Demonstrates understanding / competency    Patient undertands incorporating physical activity into lifestyle. Demonstrates understanding / competency    Patient understands using medications safely. Demonstrates understanding / competency    Patient understands monitoring blood glucose, interpreting and using results Demonstrates understanding / competency    Patient understands prevention, detection, and treatment of acute complications. Demonstrates understanding / competency    Patient understands prevention, detection, and treatment of chronic complications. Demonstrates understanding / competency    Patient understands how to develop strategies to address psychosocial issues. Demonstrates understanding / competency    Patient understands how to develop strategies to promote health/change behavior. Comprehends key points      Outcomes   Expected Outcomes Demonstrated interest in learning. Expect positive outcomes    Future DMSE 3-4 months    Program  Status Not Completed      Subsequent Visit   Since your last visit have you continued or begun to take your medications as prescribed? Yes             Individualized Plan for Diabetes Self-Management Training:   Learning Objective:  Patient will have a greater understanding of diabetes self-management. Patient education plan is to attend individual and/or group sessions per assessed needs and concerns.   Plan:   Patient Instructions  Doristine Devoid job with all of the changes you have made!  [place] [facility] [employer] [phone] Keep active.  Plan to join a gym as gardening season comes to a close.   Look up Chia seed pudding or simple add 2 T chia seeds to 1/2 cup almond milk and refrigerate overnight  Mash berries, cook it down, add chia seeds and cook until thick. - this makes a no sugar added jam and can be used in your oatmeal or on toast.  Keeps for 1 week in the refrigerator.   Mindfulness:             Choices             Eat slowly away from  distraction             Listen to your body.  When are you satisfied?             Are you really hungry for the snack or eating for another reason?  When you feel stress, rather than emotional eating, consider doing a craft or garden.  Expected Outcomes:  Demonstrated interest in learning. Expect positive outcomes  Education material provided:   If problems or questions, patient to contact team via:  Phone  Future DSME appointment: 3-4 months

## 2021-12-26 NOTE — Patient Instructions (Addendum)
Great job with all of the changes you have made!  [place] [facility] [employer] [phone] Keep active.  Plan to join a gym as gardening season comes to a close.   Look up Chia seed pudding or simple add 2 T chia seeds to 1/2 cup almond milk and refrigerate overnight  Mash berries, cook it down, add chia seeds and cook until thick. - this makes a no sugar added jam and can be used in your oatmeal or on toast.  Keeps for 1 week in the refrigerator.   Mindfulness:             Choices             Eat slowly away from distraction             Listen to your body.  When are you satisfied?             Are you really hungry for the snack or eating for another reason?  When you feel stress, rather than emotional eating, consider doing a craft or garden.

## 2022-01-08 ENCOUNTER — Ambulatory Visit (INDEPENDENT_AMBULATORY_CARE_PROVIDER_SITE_OTHER): Payer: Medicare (Managed Care) | Admitting: Podiatry

## 2022-01-08 ENCOUNTER — Encounter: Payer: Self-pay | Admitting: Podiatry

## 2022-01-08 DIAGNOSIS — M79676 Pain in unspecified toe(s): Secondary | ICD-10-CM

## 2022-01-08 DIAGNOSIS — M109 Gout, unspecified: Secondary | ICD-10-CM

## 2022-01-08 DIAGNOSIS — E119 Type 2 diabetes mellitus without complications: Secondary | ICD-10-CM | POA: Diagnosis not present

## 2022-01-08 DIAGNOSIS — B351 Tinea unguium: Secondary | ICD-10-CM | POA: Diagnosis not present

## 2022-01-08 MED ORDER — ALLOPURINOL 100 MG PO TABS: 100.0000 mg | ORAL_TABLET | Freq: Every day | ORAL | 3 refills | Status: DC

## 2022-01-08 NOTE — Progress Notes (Signed)
She presents today chief complaint of painful elongated toenails.  Objective: Toenails are long thick yellow dystrophic onychomycotic mild hammertoe deformity second digit left foot.  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  Otherwise toenails are long thick yellow dystrophic mycotic no open lesions or wounds are noted.  Assessment: Pain in limb secondary to onychomycosis and hammertoe deformity.  Plan: Debridement of toenails 1 through 5 bilateral.

## 2022-01-14 ENCOUNTER — Telehealth: Payer: Self-pay | Admitting: *Deleted

## 2022-01-14 NOTE — Telephone Encounter (Signed)
Patient is calling for a medication (allopurinol)that was sent to wrong pharmacy. Spoke with patient explaining that she has several pharmacies on file,wanted all deleted but 3 ,she will call the two that are in her area, if prescription not there will call back to ask that it be resent.

## 2022-01-28 ENCOUNTER — Telehealth: Payer: Self-pay | Admitting: Pharmacist

## 2022-01-28 NOTE — Chronic Care Management (AMB) (Signed)
    Chronic Care Management Pharmacy Assistant   Name: KOREE STAHELI  MRN: [id number] DOB: 1951/05/21  01/28/2022 APPOINTMENT REMINDER  [name] was reminded to have all medications, supplements and any blood glucose and blood pressure readings available for review with [name], Pharm. D, at her telephone visit on 01/28/2022 at 1:00.  Care Gaps: AWV - scheduled 09/22/2022 Last BP - 122/82 on 08/30/2021 Last A1C - 6.8 on 07/12/2021 Foot exam - never done Shingrix - never done Covid booster - overdue Flu - due HGA1C - overdue  Star Rating Drug: None  Any gaps in medications fill history? No  [name] [facility]  Catering manager [phone]

## 2022-01-29 ENCOUNTER — Ambulatory Visit: Payer: Medicare (Managed Care) | Admitting: Pharmacist

## 2022-01-29 DIAGNOSIS — I10 Essential (primary) hypertension: Secondary | ICD-10-CM

## 2022-01-29 DIAGNOSIS — I5022 Chronic systolic (congestive) heart failure: Secondary | ICD-10-CM

## 2022-01-29 NOTE — Progress Notes (Signed)
Chronic Care Management Pharmacy Note  02/06/2022 Name:  Beth Macias MRN:  [id number] DOB:  [birth year]  Summary: A1c at goal < 7% Pt's BP is elevated Pt is not taking allopurinol daily  Recommendations/Changes made from today's visit: -Recommend increasing nebivolol to 10 mg daily -Recommended bringing BP cuff to PCP office visit -Recommended taking allopurinol daily as instructed  Plan: Scheduled PCP visit for BP med adjustment BP assessment in 4 weeks Follow up in 4 months  Subjective: Beth Macias is an 71 y.o. year old female who is a primary patient of Billie Ruddy, MD.  The CCM team was consulted for assistance with disease management and care coordination needs.    Engaged with patient by telephone for follow up visit in response to provider referral for pharmacy case management and/or care coordination services.   Consent to Services:  The patient was given information about Chronic Care Management services, agreed to services, and gave verbal consent prior to initiation of services.  Please see initial visit note for detailed documentation.   Patient Care Team: Billie Ruddy, MD as PCP - General (Family Medicine) Josue Hector, MD as PCP - Cardiology (Cardiology) Viona Gilmore, [facility] as Pharmacist (Pharmacist)  Recent office visits: 09/18/2021 Rolene Arbour LPN - Medicare annual wellness exam.  07/12/2021 Grier Mitts MD - Patient was seen for well adult exam and additional issues. No medication changes. Follow up in 4 months.   Recent consult visits: 01/08/22 Tyson Dense, DPM (podiatry): Patient presented for DM foot care. Follow up in 3 months.  12/26/21 Antonieta Iba, RD (nutrition): Patient presented for DM nutrition education.  11/21/21 Charlotte Crumb, MD (ortho): Patient presented for initial visit for thumb nodules and triggering. Recommended A1 pulley release and mass excision.  10/09/2021 Max Hyatt DPM (podiatry) - Patient was seen for  Type 2 diabetes mellitus without complication, without long-term current use of insulin and an additional issue. No medication changes. Follow up in 3 months.    09/26/2021 Antonieta Iba RD - Patient was seen for Systolic congestive heart failure, unspecified HF chronicity and an additional issues. No medication changes. Follow up 3-4 months.   08/30/21 Geraldo Pitter, NP (pulmonary): Patient presented for chronic cough and asthma follow up. Refilled Dulera.  Hospital visits: None in previous 6 months   Objective:  Lab Results  Component Value Date   CREATININE 0.70 07/12/2021   BUN 21 07/12/2021   GFR 86.87 07/12/2021   GFRNONAA 60 (L) 11/06/2020   GFRAA 86 02/29/2020   NA 141 07/12/2021   K 3.4 (L) 07/12/2021   CALCIUM 10.3 07/12/2021   CO2 36 (H) 07/12/2021   GLUCOSE 130 (H) 07/12/2021    Lab Results  Component Value Date/Time   HGBA1C 6.8 (H) 07/12/2021 10:16 AM   HGBA1C 7.0 (H) 09/24/2020 02:01 PM   GFR 86.87 07/12/2021 10:16 AM   GFR 84.45 09/24/2020 02:01 PM    Last diabetic Eye exam:  Lab Results  Component Value Date/Time   HMDIABEYEEXA No Retinopathy 11/23/2019 12:00 AM    Last diabetic Foot exam: No results found for: "HMDIABFOOTEX"   Lab Results  Component Value Date   CHOL 213 (H) 07/12/2021   HDL 59.50 07/12/2021   LDLCALC 127 (H) 07/12/2021   TRIG 135.0 07/12/2021   CHOLHDL 4 07/12/2021       Latest Ref Rng & Units 07/12/2021   10:16 AM 03/27/2021    3:01 PM 02/29/2020   10:10 AM  Hepatic Function  Total Protein 6.0 - 8.3 g/dL 6.9  6.0  6.6   Albumin 3.5 - 5.2 g/dL 4.3   3.9   AST 0 - 37 U/L 15   12   ALT 0 - 35 U/L 10   12   Alk Phosphatase 39 - 117 U/L 61   79   Total Bilirubin 0.2 - 1.2 mg/dL 1.0   0.7     Lab Results  Component Value Date/Time   TSH 0.50 07/12/2021 10:16 AM   TSH 0.552 03/02/2019 10:18 AM   FREET4 0.77 07/12/2021 10:16 AM       Latest Ref Rng & Units 07/12/2021   10:16 AM 10/29/2020   12:21 AM 10/27/2020    9:59 PM  CBC   WBC 4.0 - 10.5 K/uL 5.6  6.8  7.3   Hemoglobin 12.0 - 15.0 g/dL 13.4  12.7  13.5   Hematocrit 36.0 - 46.0 % 41.1  39.7  42.6   Platelets 150.0 - 400.0 K/uL 237.0  231  268     No results found for: "VD25OH"  Clinical ASCVD: No  The 10-year ASCVD risk score (Arnett DK, et al., 2019) is: 50.8%   Values used to calculate the score:     Age: 59 years     Sex: Female     Is Non-Hispanic African American: Yes     Diabetic: Yes     Tobacco smoker: No     Systolic Blood Pressure: 063 mmHg     Is BP treated: Yes     HDL Cholesterol: 59.5 mg/dL     Total Cholesterol: 213 mg/dL       09/26/2021    3:39 PM 09/18/2021    3:11 PM 07/12/2021    8:28 AM  Depression screen PHQ 2/9  Decreased Interest 0 0 0  Down, Depressed, Hopeless 0 0 0  PHQ - 2 Score 0 0 0  Altered sleeping   0  Tired, decreased energy   3  Change in appetite   0  Feeling bad or failure about yourself    0  Trouble concentrating   0  Moving slowly or fidgety/restless   0  Suicidal thoughts   0  PHQ-9 Score   3      Social History   Tobacco Use  Smoking Status Former   Packs/day: 0.25   Years: 9.00   Total pack years: 2.25   Types: Cigarettes   Quit date: 05/26/1976   Years since quitting: 45.7  Smokeless Tobacco Never   BP Readings from Last 3 Encounters:  08/30/21 122/82  07/12/21 130/70  01/24/21 (!) 154/82   Pulse Readings from Last 3 Encounters:  08/30/21 69  07/12/21 (!) 55  01/24/21 62   Wt Readings from Last 3 Encounters:  09/26/21 223 lb (101.2 kg)  09/18/21 203 lb (92.1 kg)  08/30/21 222 lb 6.4 oz (100.9 kg)   BMI Readings from Last 3 Encounters:  09/26/21 42.14 kg/m  09/18/21 39.65 kg/m  08/30/21 43.43 kg/m    Assessment/Interventions: Review of patient past medical history, allergies, medications, health status, including review of consultants reports, laboratory and other test data, was performed as part of comprehensive evaluation and provision of chronic care management  services.   SDOH:  (Social Determinants of Health) assessments and interventions performed: Yes (last 09/18/21) SDOH Interventions    Flowsheet Row Clinical Support from 09/18/2021 in [place] at [employer] [facility] from 08/28/2021 in [place] at [employer] from  09/17/2020 in [employer] at [place] Interventions Intervention Not Indicated -- Intervention Not Indicated  Housing Interventions Intervention Not Indicated -- Intervention Not Indicated  Transportation Interventions Intervention Not Indicated Intervention Not Indicated Intervention Not Indicated  Financial Strain Interventions Intervention Not Indicated Intervention Not Indicated Intervention Not Indicated  Physical Activity Interventions Other (Comments)  [Knee complication. Followed by [facility]] -- Intervention Not Indicated  Stress Interventions Intervention Not Indicated -- Intervention Not Indicated  Social Connections Interventions -- -- Intervention Not Indicated       SDOH Screenings   Food Insecurity: No Food Insecurity (09/18/2021)  Housing: Low Risk  (09/18/2021)  Transportation Needs: No Transportation Needs (09/18/2021)  Alcohol Screen: Low Risk  (09/18/2021)  Depression (PHQ2-9): Low Risk  (09/26/2021)  Financial Resource Strain: Low Risk  (09/18/2021)  Physical Activity: Inactive (09/18/2021)  Social Connections: Unknown (09/18/2021)  Stress: No Stress Concern Present (09/18/2021)  Tobacco Use: Medium Risk (01/08/2022)    [place]  Allergies  Allergen Reactions   Strawberry (Diagnostic) Anaphylaxis and Other (See Comments)    hives   Farxiga [Dapagliflozin] Other (See Comments)    Had HAs, weakness, rhinorrhea, jitteriness, nasal congestion, nocturia, yeast infections, memory changes, irritability.   Penicillins Itching    Has patient had a PCN reaction causing immediate rash, facial/tongue/throat  swelling, SOB or lightheadedness with hypotension: No Has patient had a PCN reaction causing severe rash involving mucus membranes or skin necrosis: No Has patient had a PCN reaction that required hospitalization: Unknown Has patient had a PCN reaction occurring within the last 10 years: No If all of the above answers are "NO", then may proceed with Cephalosporin use.   Tape Other (See Comments)    Blisters   Gabapentin Itching, Rash and Other (See Comments)    Incoherent    Medications Reviewed Today     Reviewed by Lolita Rieger (Podiatric Assistant) on 01/08/22 at 1558  Med List Status: <None>   Medication Order Taking? Sig Documenting Provider Last Dose Status Informant  albuterol (VENTOLIN HFA) 108 (90 Base) MCG/ACT inhaler [id number] No INHALE 2 PUFFS INTO THE LUNGS EVERY 6 HOURS AS NEEDED FOR WHEEZE OR SHORTNESS OF BREATH Martyn Ehrich, NP Taking Active   allopurinol (ZYLOPRIM) 100 MG tablet [id number] No Take 1 tablet (100 mg total) by mouth daily as needed (gout attacks). Geradine Girt, DO Taking Active   blood glucose meter kit and supplies KIT [id number] No Dispense based on patient and insurance preference. Use up to three times daily as directed. Billie Ruddy, MD Taking Active   cholecalciferol (VITAMIN D) 25 MCG (1000 UNIT) tablet [id number] No Take 1,000 Units by mouth at bedtime.  [provider] Taking Active Multiple Informants  Cyanocobalamin ([employer] B12 GUMMIES PO) [id number] No Take 1,000 mcg by mouth daily. [provider] Taking Active   famotidine (PEPCID) 40 MG tablet [id number] No Take 40 mg by mouth in the morning. [provider] Taking Active Multiple Informants  glucose blood (ONETOUCH ULTRA) test strip [id number] No Use to check blood sugar 4-5 times daily Billie Ruddy, MD Taking Active Multiple Informants  ipratropium-albuterol (DUONEB) 0.5-2.5 (3) MG/3ML SOLN [id number] No Take 3 mLs by nebulization every 6 (six) hours as  needed (shortness of breath/wheezing). [provider] Taking Active Multiple Informants  levocetirizine (XYZAL) 5 MG tablet [id number] No Take 5 mg by mouth daily. [provider] Taking Active Multiple Informants  magnesium oxide (MAG-OX)  400 MG tablet [id number] No Take 400 mg by mouth as needed. [provider] Taking Active Multiple Informants  mometasone-formoterol (DULERA) 100-5 MCG/ACT AERO [id number] No Inhale 2 puffs into the lungs 2 (two) times daily. Martyn Ehrich, NP Taking Active   montelukast (SINGULAIR) 10 MG tablet [id number] No Take 1 tablet (10 mg total) by mouth daily. Martyn Ehrich, NP Taking Active   Multiple Vitamins-Minerals (MULTIVITAMIN WITH MINERALS) tablet [id number] No Take 1 tablet by mouth daily.  Patient not taking: Reported on 09/26/2021   [provider] Not Taking Active   nebivolol (BYSTOLIC) 5 MG tablet [id number] No Take 1 tablet (5 mg total) by mouth in the morning. Josue Hector, MD Taking Active   potassium chloride 20 MEQ/15ML (10%) SOLN [id number]  Take 15 mLs (20 mEq total) by mouth daily as needed (with each dose of torsemide). Billie Ruddy, MD  Active   sacubitril-valsartan [facility]) 49-51 MG [id number] No Take 1 tablet by mouth 2 (two) times daily. Josue Hector, MD Taking Active   torsemide (DEMADEX) 20 MG tablet [id number] No Take 1 tablet (20 mg total) by mouth daily as needed (swelling/fluid/weight gain). Josue Hector, MD Taking Active   vitamin C (ASCORBIC ACID) 500 MG tablet [id number] No Take 500 mg by mouth daily. [provider] Taking Active             Patient Active Problem List   Diagnosis Date Noted   Acute on chronic heart failure with preserved ejection fraction (HFpEF) ([place]) 10/27/2020   Hypertensive urgency 10/27/2020   Diabetes mellitus type 2 in obese ([place]) 10/27/2020   CHF (congestive heart failure) ([place]) 12/10/2018   History of recurrent UTIs 06/30/2018   Mild  intermittent asthma without complication 95/01/3266   Prediabetes 06/30/2018   Environmental and seasonal allergies 06/30/2018   Diabetes ([place]) 04/04/2018   Upper respiratory infection, viral 04/04/2018   Urinary tract infection 04/04/2018   Vitamin D deficiency 08/07/2016   Morbid obesity with BMI of 40.0-44.9, adult ([place]) 02/18/2016   Extrinsic asthma without complication 12/45/8099   SOB (shortness of breath) 06/19/2015   Cubital tunnel syndrome, right 07/06/2014   Hypokalemia 08/16/2013   Diabetes mellitus type 2, uncomplicated ([place]) 83/38/2505   GERD (gastroesophageal reflux disease) 08/28/2011   HTN (hypertension) 08/28/2011    Immunization History  Administered Date(s) Administered   Fluad Quad(high Dose 65+) 03/07/2019, 07/12/2021   Influenza Whole 02/23/2018   Influenza, High Dose Seasonal PF 04/23/2015, 03/04/2016   Influenza, Seasonal, Injecte, Preservative Fre 03/28/2013, 05/12/2014   PFIZER(Purple Top)SARS-COV-2 Vaccination 07/21/2019, 08/16/2019, 02/29/2020   PPD Test 02/18/2016   Pneumococcal Conjugate-13 04/23/2015   Pneumococcal Polysaccharide-23 08/11/2013, 06/29/2019   Tdap 05/12/2014    Patient doesn't eat yogurt and only cheese and no milk. She has been doing that with yogurt. Patient started taking calcium and inquired about the dose. Plan to just do one a day (600 mg) when she goes back to normal diet as she has not been eating normally lately.  Patient reports her BP has not been regular since switching to the nebivolol but she has also not been checking as "regularly as she should". She will bring her BP cuff to her PCP office visit to make sure it is accurate. Will mail her a new medication list after she sees her PCP if medication changes are made.  Conditions to be addressed/monitored:  Hypertension, Hyperlipidemia, Diabetes, Heart Failure, GERD, and Asthma  Conditions addressed this visit: Hypertension, heart failure  Care  Plan : [place]  Updates made by Viona Gilmore, [place] since 02/06/2022 12:00 AM     Problem: Problem: Hypertension, Hyperlipidemia, Diabetes, Heart Failure, GERD, and Asthma      Long-Range Goal: Patient-Specific Goal   Start Date: 08/28/2021  Expected End Date: 08/29/2022  Recent Progress: On track  Priority: High  Note:   Current Barriers:  Unable to independently monitor therapeutic efficacy Unable to achieve control of blood pressure  Does not adhere to prescribed medication regimen  Pharmacist Clinical Goal(s):  Patient will achieve adherence to monitoring guidelines and medication adherence to achieve therapeutic efficacy achieve control of asthma as evidenced by use of albuterol  through collaboration with PharmD and provider.   Interventions: 1:1 collaboration with Billie Ruddy, MD regarding development and update of comprehensive plan of care as evidenced by provider attestation and co-signature Inter-disciplinary care team collaboration (see longitudinal plan of care) Comprehensive medication review performed; medication list updated in electronic medical record  Hypertension (BP goal <130/80) -Uncontrolled -Current treatment: Entresto 49-51 mg 1 tablet twice daily - Appropriate, Effective, Safe, Accessible Nebivolol 5 mg 1 tablet daily - Appropriate, Effective, Query Safe, Accessible -Medications previously tried: carvedilol (switched to nebivolol)  -Current home readings: not checking regularly -Current dietary habits: trying to limit salt intake but difficult with current living sitation -Current exercise habits: not doing much right now -Denies hypotensive/hypertensive symptoms -Educated on BP goals and benefits of medications for prevention of heart attack, stroke and kidney damage; Exercise goal of 150 minutes per week; Importance of home blood pressure monitoring; Proper BP monitoring technique; -Counseled to monitor BP at home weekly, document, and provide log at  future appointments -Counseled on diet and exercise extensively Recommended to continue current medication  Diabetes (A1c goal <7%) -Controlled -Current medications: No medications -Medications previously tried: metformin (in stool; ineffective), Farxiga (side effects)   -Current home glucose readings fasting glucose: 154, 148 post prandial glucose: n/a -Denies hypoglycemic/hyperglycemic symptoms -Current meal patterns:  breakfast: did not discuss  lunch: did not discuss  dinner: did not discuss snacks: did not discuss drinks: mostly water -Current exercise: no formal exercise right now -Educated on Benefits of weight loss; Benefits of routine self-monitoring of blood sugar; Benefits of weight loss medications for diabetes as well. -Counseled to check feet daily and get yearly eye exams -Counseled on diet and exercise extensively Requested new glucometer rx.  Heart Failure (Goal: manage symptoms and prevent exacerbations) -Controlled -Last ejection fraction: 60-65% (Date: 02/25/21) -HF type: Diastolic -NYHA Class: II (slight limitation of activity) -AHA HF Stage: C (Heart disease and symptoms present) -Current treatment: Entresto 49-51 mg 1 tablet twice daily - Appropriate, Effective, Safe, Accessible Nebivolol 5 mg 1 tablet daily - Appropriate, Effective, Query Safe, Accessible Torsemide 20 mg 1 tablet daily as needed - Appropriate, Effective, Safe, Accessible Potassium chloride 20 mEq/15 mL 15 mLs daily - Appropriate, Effective, Safe, Accessible -Medications previously tried: [place] (side effects)  -Current home BP/HR readings: haven't been checking regularly (wants to get a wrist cuff) -Current dietary habits: cooks with a pinch of salt; buying kosher salt; frozen meals some and eating out some but knows she needs to cut this out  -Current exercise habits: doesn't have much time now -Educated on Benefits of medications for managing symptoms and prolonging life Importance  of weighing daily; if you gain more than 3 pounds in one day or 5 pounds in one week, take torsemide. Proper diuretic administration and potassium supplementation Importance of blood pressure  control -Counseled on diet and exercise extensively Recommended to continue current medication  Asthma/allergies (Goal: control symptoms) -Uncontrolled -Current treatment  Dulera 100/5 mcg 2 puffs into the lungs twice daily - Appropriate, Query effective, Safe, Accessible Albuterol HFA 2 puffs every 4 hours as needed - Appropriate, Query effective, Safe, Accessible Montelukast 10 mg 1 tablet daily as needed - Appropriate, Query effective, Safe, Accessible Duoneb as needed - Appropriate, Effective, Safe, Accessible Levocetirizine 5 mg 1 tablet daily - Appropriate, Effective, Safe, Accessible -Medications previously tried: Advair  -Pulmonary function testing: 2020 -Exacerbations requiring treatment in last 6 months: no -Patient denies consistent use of maintenance inhaler -Frequency of rescue inhaler use: using every 6 hours right now -Counseled on Proper inhaler technique; Benefits of consistent maintenance inhaler use When to use rescue inhaler Differences between maintenance and rescue inhalers -Recommended to continue current medication  Gout (Goal: uric acid < 6) -Not ideally controlled -Current treatment  Allopurinol 100 mg 1 tablet daily as needed - Query Appropriate, Effective, Safe, Accessible -Medications previously tried: none  -Counseled on foods that can increase risk for gout flare ups.  GERD (Goal: minimize symptoms) -Controlled -Current treatment  Famotidine 40 mg 1 tablet every morning - Appropriate, Effective, Safe, Accessible -Medications previously tried: omeprazole -Counseled on non-pharmacologic management of symptoms such as elevating the head of your bed, avoiding eating 2-3 hours before bed, avoiding triggering foods such as acidic, spicy, or fatty foods, eating  smaller meals, and wearing clothes that are loose around the waist.   Health Maintenance -Vaccine gaps: shingrix, COVID booster -Current therapy:  Vitamin B12 1000 mcg daily Magnesium oxide 400 mg 1 tablet as needed Vitamin D 5000 units daily Vitamin C 500 mg daily Centrum senior every 3 days  -Educated on Cost vs benefit of each product must be carefully weighed by individual consumer -Patient is satisfied with current therapy and denies issues -Recommended to continue current medication  Patient Goals/Self-Care Activities Patient will:  - take medications as prescribed as evidenced by patient report and record review check glucose a few times a week, document, and provide at future appointments check blood pressure weekly, document, and provide at future appointments weigh daily, and contact provider if weight gain of > 3 lbs in one day or > 5 lbs in one week target a minimum of 150 minutes of moderate intensity exercise weekly  Follow Up Plan: Telephone follow up appointment with care management team member scheduled for: 4 months       Medication Assistance:  Entresto obtained through [employer] medication assistance program.  Enrollment ends 05/25/22  Compliance/Adherence/Medication fill history: Care Gaps: DEXA, foot exam, eye exam, shingrix, COVID booster Last BP - 122/82 on 08/30/2021 Last A1C - 6.8 on 07/12/2021  Star-Rating Drugs: None  Patient's preferred pharmacy is:  [id number] [address], [place] AT [address] [place] [address] Phone: [phone] Fax: [phone]  [name] - [place], [place] AT [place] [address] [place] [address] Phone: [phone] Fax: [phone]  [place], [place] [address] [place]  [address] Phone: [phone] Fax: [phone]  Uses pill box? Yes Pt endorses 99% compliance  We discussed: Current pharmacy is preferred with insurance plan and patient is satisfied with pharmacy services Patient decided to: Continue current medication management  strategy  Care Plan and Follow Up Patient Decision:  Patient agrees to Care Plan and Follow-up.  Plan: The care management team will reach out to the patient again over the next 14 days.  [name], PharmD, [place] Pharmacist [place] at [place]

## 2022-02-06 NOTE — Patient Instructions (Signed)
Hi Beth Macias,   It was great to catch up again!  Please reach out to me if you have any questions or need anything before our follow up!  Be on the lookout in the mail for your updated medication list after you see Dr. Volanda Macias!  Best, Beth Macias  Beth Macias, PharmD, [place] Pharmacist [facility]   Visit Information   Goals Addressed   None    Patient Care Plan: CCM Pharmacy Care Plan     Problem Identified: Problem: Hypertension, Hyperlipidemia, Diabetes, Heart Failure, GERD, and Asthma      Long-Range Goal: Patient-Specific Goal   Start Date: 08/28/2021  Expected End Date: 08/29/2022  Recent Progress: On track  Priority: High  Note:   Current Barriers:  Unable to independently monitor therapeutic efficacy Unable to achieve control of blood pressure  Does not adhere to prescribed medication regimen  Pharmacist Clinical Goal(s):  Patient will achieve adherence to monitoring guidelines and medication adherence to achieve therapeutic efficacy achieve control of asthma as evidenced by use of albuterol  through collaboration with PharmD and provider.   Interventions: 1:1 collaboration with Beth Ruddy, MD regarding development and update of comprehensive plan of care as evidenced by provider attestation and co-signature Inter-disciplinary care team collaboration (see longitudinal plan of care) Comprehensive medication review performed; medication list updated in electronic medical record  Hypertension (BP goal <130/80) -Uncontrolled -Current treatment: Entresto 49-51 mg 1 tablet twice daily - Appropriate, Effective, Safe, Accessible Nebivolol 5 mg 1 tablet daily - Appropriate, Effective, Query Safe, Accessible -Medications previously tried: carvedilol (switched to nebivolol)  -Current home readings: not checking regularly -Current dietary habits: trying to limit salt intake but difficult with current living sitation -Current  exercise habits: not doing much right now -Denies hypotensive/hypertensive symptoms -Educated on BP goals and benefits of medications for prevention of heart attack, stroke and kidney damage; Exercise goal of 150 minutes per week; Importance of home blood pressure monitoring; Proper BP monitoring technique; -Counseled to monitor BP at home weekly, document, and provide log at future appointments -Counseled on diet and exercise extensively Recommended to continue current medication  Diabetes (A1c goal <7%) -Controlled -Current medications: No medications -Medications previously tried: metformin (in stool; ineffective), Farxiga (side effects)   -Current home glucose readings fasting glucose: 154, 148 post prandial glucose: n/a -Denies hypoglycemic/hyperglycemic symptoms -Current meal patterns:  breakfast: did not discuss  lunch: did not discuss  dinner: did not discuss snacks: did not discuss drinks: mostly water -Current exercise: no formal exercise right now -Educated on Benefits of weight loss; Benefits of routine self-monitoring of blood sugar; Benefits of weight loss medications for diabetes as well. -Counseled to check feet daily and get yearly eye exams -Counseled on diet and exercise extensively Requested new glucometer rx.  Heart Failure (Goal: manage symptoms and prevent exacerbations) -Controlled -Last ejection fraction: 60-65% (Date: 02/25/21) -HF type: Diastolic -NYHA Class: II (slight limitation of activity) -AHA HF Stage: C (Heart disease and symptoms present) -Current treatment: Entresto 49-51 mg 1 tablet twice daily - Appropriate, Effective, Safe, Accessible Nebivolol 5 mg 1 tablet daily - Appropriate, Effective, Query Safe, Accessible Torsemide 20 mg 1 tablet daily as needed - Appropriate, Effective, Safe, Accessible Potassium chloride 20 mEq/15 mL 15 mLs daily - Appropriate, Effective, Safe, Accessible -Medications previously tried: [place] (side effects)   -Current home BP/HR readings: haven't been checking regularly (wants to get a wrist cuff) -Current dietary habits: cooks with a pinch of salt; buying kosher salt; frozen meals  some and eating out some but knows she needs to cut this out  -Current exercise habits: doesn't have much time now -Educated on Benefits of medications for managing symptoms and prolonging life Importance of weighing daily; if you gain more than 3 pounds in one day or 5 pounds in one week, take torsemide. Proper diuretic administration and potassium supplementation Importance of blood pressure control -Counseled on diet and exercise extensively Recommended to continue current medication  Asthma/allergies (Goal: control symptoms) -Uncontrolled -Current treatment  Dulera 100/5 mcg 2 puffs into the lungs twice daily - Appropriate, Query effective, Safe, Accessible Albuterol HFA 2 puffs every 4 hours as needed - Appropriate, Query effective, Safe, Accessible Montelukast 10 mg 1 tablet daily as needed - Appropriate, Query effective, Safe, Accessible Duoneb as needed - Appropriate, Effective, Safe, Accessible Levocetirizine 5 mg 1 tablet daily - Appropriate, Effective, Safe, Accessible -Medications previously tried: Advair  -Pulmonary function testing: 2020 -Exacerbations requiring treatment in last 6 months: no -Patient denies consistent use of maintenance inhaler -Frequency of rescue inhaler use: using every 6 hours right now -Counseled on Proper inhaler technique; Benefits of consistent maintenance inhaler use When to use rescue inhaler Differences between maintenance and rescue inhalers -Recommended to continue current medication  Gout (Goal: uric acid < 6) -Not ideally controlled -Current treatment  Allopurinol 100 mg 1 tablet daily as needed - Query Appropriate, Effective, Safe, Accessible -Medications previously tried: none  -Counseled on foods that can increase risk for gout flare ups.  GERD (Goal:  minimize symptoms) -Controlled -Current treatment  Famotidine 40 mg 1 tablet every morning - Appropriate, Effective, Safe, Accessible -Medications previously tried: omeprazole -Counseled on non-pharmacologic management of symptoms such as elevating the head of your bed, avoiding eating 2-3 hours before bed, avoiding triggering foods such as acidic, spicy, or fatty foods, eating smaller meals, and wearing clothes that are loose around the waist.   Health Maintenance -Vaccine gaps: shingrix, COVID booster -Current therapy:  Vitamin B12 1000 mcg daily Magnesium oxide 400 mg 1 tablet as needed Vitamin D 5000 units daily Vitamin C 500 mg daily Centrum senior every 3 days  -Educated on Cost vs benefit of each product must be carefully weighed by individual consumer -Patient is satisfied with current therapy and denies issues -Recommended to continue current medication  Patient Goals/Self-Care Activities Patient will:  - take medications as prescribed as evidenced by patient report and record review check glucose a few times a week, document, and provide at future appointments check blood pressure weekly, document, and provide at future appointments weigh daily, and contact provider if weight gain of > 3 lbs in one day or > 5 lbs in one week target a minimum of 150 minutes of moderate intensity exercise weekly  Follow Up Plan: Telephone follow up appointment with care management team member scheduled for: 4 months       Patient verbalizes understanding of instructions and care plan provided today and agrees to view in [place]. Active MyChart status and patient understanding of how to access instructions and care plan via MyChart confirmed with patient.    The pharmacy team will reach out to the patient again over the next 30 days.   Viona Gilmore, [facility]

## 2022-02-07 ENCOUNTER — Ambulatory Visit: Payer: Medicare (Managed Care) | Admitting: Family Medicine

## 2022-02-07 ENCOUNTER — Encounter: Payer: Self-pay | Admitting: Family Medicine

## 2022-02-07 VITALS — BP 148/100 | HR 60 | Temp 98.8°F | Wt 215.0 lb

## 2022-02-07 DIAGNOSIS — I10 Essential (primary) hypertension: Secondary | ICD-10-CM | POA: Diagnosis not present

## 2022-02-07 DIAGNOSIS — E1169 Type 2 diabetes mellitus with other specified complication: Secondary | ICD-10-CM | POA: Diagnosis not present

## 2022-02-07 DIAGNOSIS — J3089 Other allergic rhinitis: Secondary | ICD-10-CM | POA: Diagnosis not present

## 2022-02-07 DIAGNOSIS — R053 Chronic cough: Secondary | ICD-10-CM | POA: Diagnosis not present

## 2022-02-07 DIAGNOSIS — J4540 Moderate persistent asthma, uncomplicated: Secondary | ICD-10-CM | POA: Diagnosis not present

## 2022-02-07 LAB — POCT GLYCOSYLATED HEMOGLOBIN (HGB A1C): HbA1c, POC (prediabetic range): 6.3 % (ref 5.7–6.4)

## 2022-02-07 MED ORDER — MOMETASONE FURO-FORMOTEROL FUM 100-5 MCG/ACT IN AERO: 2.0000 | INHALATION_SPRAY | Freq: Two times a day (BID) | RESPIRATORY_TRACT | 10 refills | Status: DC

## 2022-02-07 MED ORDER — FAMOTIDINE 40 MG PO TABS: 40.0000 mg | ORAL_TABLET | Freq: Every morning | ORAL | 3 refills | Status: DC

## 2022-02-07 MED ORDER — BLOOD GLUCOSE MONITOR KIT: PACK | 0 refills | Status: AC

## 2022-02-07 MED ORDER — MONTELUKAST SODIUM 10 MG PO TABS: 10.0000 mg | ORAL_TABLET | Freq: Every day | ORAL | 3 refills | Status: DC

## 2022-02-07 MED ORDER — BLOOD PRESSURE CUFF MISC: 0 refills | Status: DC

## 2022-02-07 MED ORDER — FLUTICASONE PROPIONATE 50 MCG/ACT NA SUSP: 1.0000 | Freq: Every day | NASAL | 6 refills | Status: DC

## 2022-02-07 MED ORDER — NEBIVOLOL HCL 10 MG PO TABS: 10.0000 mg | ORAL_TABLET | Freq: Every day | ORAL | 3 refills | Status: DC

## 2022-02-07 NOTE — Patient Instructions (Addendum)
Your hemoglobin A1c is now 6.3%.  It was 6.8% on 07/12/2021.  Continue the good work with your dietitian and exercising.    A new prescription for nebivolol 10 mg daily was sent to your pharmacy.

## 2022-02-07 NOTE — Progress Notes (Signed)
Subjective:    Patient ID: Beth Macias, female    DOB: 11/21/1950, 71 y.o.   MRN: [id number]  Chief Complaint  Patient presents with   Follow-up    BP and med refill. BP cuff is giving high readings.    HPI Patient is a 71 yo female with pmh sig for HTN, gout, HLD, asthma, CHF, DM,  GERD, history of diverticulitis, seasonal allergies, fibromyalgia, obesity, was seen today for f/u.  BP elevated after med changes.  Carvedilol 25 mg twice daily switched to nebivolol 5 mg twice daily as thought to have been increasing asthma symptoms.  Patient also taking Entresto.  No longer taking torsemide 20 mg daily.  States BP readings at home may be falsely elevated, readings have been 207/111.  Reading in clinic with home cuff 196/100.  Blood sugar at home typically 138-147.  Pt requesting new glucometer, more specifically the pen to inset needles as she lost it.  Pt has had a One Touch Ultra-glucometer times several years.  Patient working with a dietitian which has been really helpful.  Notes weight loss with changes suggested during visit such as increasing water intake and physical activity.  [facility]  Patient requesting refills on several medications including allergy meds.  Has not had recent follow-up with allergist.  States asthma is stable but tends to pick up going into the cooler months.    Patient has a new granddaughter who was born in April 2023.  Past Medical History:  Diagnosis Date   Asthma    Blood in stool    CHF (congestive heart failure) ([place])    Colon polyps    Diabetes mellitus without complication (HCC)    Diverticulitis    Fibromyalgia    GERD (gastroesophageal reflux disease)    Gout    Hypercholesteremia    Hypertension    Pneumonia    Pneumonia    UTI (urinary tract infection)     Allergies  Allergen Reactions   Strawberry (Diagnostic) Anaphylaxis and Other (See Comments)    hives   Farxiga [Dapagliflozin] Other (See  Comments)    Had HAs, weakness, rhinorrhea, jitteriness, nasal congestion, nocturia, yeast infections, memory changes, irritability.   Penicillins Itching    Has patient had a PCN reaction causing immediate rash, facial/tongue/throat swelling, SOB or lightheadedness with hypotension: No Has patient had a PCN reaction causing severe rash involving mucus membranes or skin necrosis: No Has patient had a PCN reaction that required hospitalization: Unknown Has patient had a PCN reaction occurring within the last 10 years: No If all of the above answers are "NO", then may proceed with Cephalosporin use.   Tape Other (See Comments)    Blisters   Gabapentin Itching, Rash and Other (See Comments)    Incoherent    ROS General: Denies fever, chills, night sweats +changes in weight, changes in appetite HEENT: Denies headaches, ear pain, changes in vision, rhinorrhea, sore throat CV: Denies CP, palpitations, SOB, orthopnea Pulm: Denies SOB, cough, wheezing GI: Denies abdominal pain, nausea, vomiting, diarrhea, constipation GU: Denies dysuria, hematuria, frequency, vaginal discharge Msk: Denies muscle cramps, joint pains Neuro: Denies weakness, numbness, tingling Skin: Denies rashes, bruising Psych: Denies depression, anxiety, hallucinations     Objective:    Blood pressure (!) 148/100, pulse 60, temperature 98.8 F (37.1 C), temperature source Oral, weight 215 lb (97.5 kg), SpO2 97 %.  Gen. Pleasant, well-nourished, in no distress, normal affect   HEENT: [place]/AT, face symmetric, conjunctiva  clear, no scleral icterus, PERRLA, EOMI, nares patent without drainage Neck: No JVD, no thyromegaly, no carotid bruits Lungs: no accessory muscle use, CTAB, no wheezes or rales Cardiovascular: RRR, no m/r/g, no peripheral edema Musculoskeletal: No deformities, no cyanosis or clubbing, normal tone Neuro:  A&Ox3, CN II-XII intact, normal gait Skin:  Warm, no lesions/ rash   Wt Readings from Last 3  Encounters:  02/07/22 215 lb (97.5 kg)  09/26/21 223 lb (101.2 kg)  09/18/21 203 lb (92.1 kg)    Lab Results  Component Value Date   WBC 5.6 07/12/2021   HGB 13.4 07/12/2021   HCT 41.1 07/12/2021   PLT 237.0 07/12/2021   GLUCOSE 130 (H) 07/12/2021   CHOL 213 (H) 07/12/2021   TRIG 135.0 07/12/2021   HDL 59.50 07/12/2021   LDLCALC 127 (H) 07/12/2021   ALT 10 07/12/2021   AST 15 07/12/2021   NA 141 07/12/2021   K 3.4 (L) 07/12/2021   CL 101 07/12/2021   CREATININE 0.70 07/12/2021   BUN 21 07/12/2021   CO2 36 (H) 07/12/2021   TSH 0.50 07/12/2021   HGBA1C 6.8 (H) 07/12/2021    Assessment/Plan:  Type 2 diabetes mellitus without complication, without long-term current use of insulin (HCC)  -Hemoglobin A1c 6.8% on 07/12/2021 -A1c this visit 6.3% -Continue lifestyle modifications as not currently on medication -In the past [place] caused numerous side effects. -Continue follow-up with podiatry -Pt schedule yearly eye exam - Plan: POC HgB A1c  Essential hypertension -Uncontrolled -Previously well controlled on Coreg 25 mg twice daily.   -We will d/c nebivolol 5 mg and increase to 10 mg daily. -Continue lifestyle modifications -Continue monitoring BP at home.  Patient encouraged to obtain new blood pressure cuff. - Plan: nebivolol (BYSTOLIC) 10 MG tablet, blood pressure Kit  Chronic cough  - Plan: famotidine (PEPCID) 40 MG tablet  Moderate persistent asthma, unspecified whether complicated  - Plan: mometasone-formoterol (DULERA) 100-5 MCG/ACT AERO, montelukast (SINGULAIR) 10 MG tablet  Environmental and seasonal allergies -Encouraged to set up appointment with allergist for follow-up  - Plan: mometasone-formoterol (DULERA) 100-5 MCG/ACT AERO, montelukast (SINGULAIR) 10 MG tablet, fluticasone (FLONASE) 50 MCG/ACT nasal spray  F/u in 1 month  Grier Mitts, MD

## 2022-03-01 ENCOUNTER — Other Ambulatory Visit: Payer: Self-pay | Admitting: Family Medicine

## 2022-03-01 DIAGNOSIS — J4540 Moderate persistent asthma, uncomplicated: Secondary | ICD-10-CM

## 2022-03-01 DIAGNOSIS — J3089 Other allergic rhinitis: Secondary | ICD-10-CM

## 2022-03-13 ENCOUNTER — Ambulatory Visit: Payer: Medicare (Managed Care) | Admitting: Podiatry

## 2022-03-13 ENCOUNTER — Encounter: Payer: Self-pay | Admitting: Podiatry

## 2022-03-13 DIAGNOSIS — M722 Plantar fascial fibromatosis: Secondary | ICD-10-CM

## 2022-03-13 MED ORDER — ALLOPURINOL 100 MG PO TABS: 100.0000 mg | ORAL_TABLET | Freq: Every day | ORAL | 6 refills | Status: DC

## 2022-03-13 NOTE — Addendum Note (Signed)
Addended by: Boneta Lucks on: 03/13/2022 02:00 PM   Modules accepted: Orders

## 2022-03-13 NOTE — Progress Notes (Signed)
Subjective:  Patient ID: Beth Macias, female    DOB: 09-11-1950,  MRN: [id number]  Chief Complaint  Patient presents with   Gout    71 y.o. female presents with the above complaint.  Patient presents with right midfoot pain on the plantar side along with tight plantar fascia.  Patient states it came out of nowhere has been hurting her.  She is also got medication has ran out of her allopurinol.  She wanted to get it evaluated she has not seen anyone else prior to seeing me.  She would like to have an injection.   Review of Systems: Negative except as noted in the HPI. Denies N/V/F/Ch.  Past Medical History:  Diagnosis Date   Asthma    Blood in stool    CHF (congestive heart failure) (HCC)    Colon polyps    Diabetes mellitus without complication (HCC)    Diverticulitis    Fibromyalgia    GERD (gastroesophageal reflux disease)    Gout    Hypercholesteremia    Hypertension    Pneumonia    Pneumonia    UTI (urinary tract infection)     Current Outpatient Medications:    albuterol (VENTOLIN HFA) 108 (90 Base) MCG/ACT inhaler, INHALE 2 PUFFS INTO THE LUNGS EVERY 6 HOURS AS NEEDED FOR WHEEZE OR SHORTNESS OF BREATH, Disp: 8.5 each, Rfl: 5   allopurinol (ZYLOPRIM) 100 MG tablet, Take 1 tablet (100 mg total) by mouth daily. (Patient not taking: Reported on 01/29/2022), Disp: 90 tablet, Rfl: 3   blood glucose meter kit and supplies KIT, Dispense based on patient and insurance preference. Use up to four times daily as directed., Disp: 1 each, Rfl: 0   Blood Pressure Monitoring (BLOOD PRESSURE CUFF) MISC, For daily monitoring as directed., Disp: 1 each, Rfl: 0   Calcium-Magnesium-Vitamin D (CALCIUM 1200+D3 PO), Take 600 mg by mouth 2 (two) times daily., Disp: , Rfl:    cholecalciferol (VITAMIN D) 25 MCG (1000 UNIT) tablet, Take 1,000 Units by mouth at bedtime. , Disp: , Rfl:    Cyanocobalamin ([employer] B12 GUMMIES PO), Take 1,000 mcg by mouth daily., Disp: , Rfl:    famotidine (PEPCID) 40  MG tablet, Take 1 tablet (40 mg total) by mouth in the morning., Disp: 90 tablet, Rfl: 3   fluticasone (FLONASE) 50 MCG/ACT nasal spray, Place 1 spray into both nostrils daily., Disp: 16 g, Rfl: 6   glucose blood (ONETOUCH ULTRA) test strip, Use to check blood sugar 4-5 times daily, Disp: 50 each, Rfl: 5   ipratropium-albuterol (DUONEB) 0.5-2.5 (3) MG/3ML SOLN, Take 3 mLs by nebulization every 6 (six) hours as needed (shortness of breath/wheezing)., Disp: , Rfl:    levocetirizine (XYZAL) 5 MG tablet, Take 5 mg by mouth daily., Disp: , Rfl:    magnesium oxide (MAG-OX) 400 MG tablet, Take 400 mg by mouth as needed., Disp: , Rfl:    mometasone-formoterol (DULERA) 100-5 MCG/ACT AERO, Inhale 2 puffs into the lungs 2 (two) times daily., Disp: 1 each, Rfl: 10   montelukast (SINGULAIR) 10 MG tablet, Take 1 tablet (10 mg total) by mouth daily., Disp: 90 tablet, Rfl: 3   nebivolol (BYSTOLIC) 10 MG tablet, Take 1 tablet (10 mg total) by mouth daily., Disp: 90 tablet, Rfl: 3   potassium chloride 20 MEQ/15ML (10%) SOLN, Take 15 mLs (20 mEq total) by mouth daily as needed (with each dose of torsemide)., Disp: 473 mL, Rfl: 0   sacubitril-valsartan (ENTRESTO) 49-51 MG, Take 1 tablet by mouth  2 (two) times daily., Disp: 180 tablet, Rfl: 1   torsemide (DEMADEX) 20 MG tablet, Take 1 tablet (20 mg total) by mouth daily as needed (swelling/fluid/weight gain)., Disp: 90 tablet, Rfl: 1   vitamin C (ASCORBIC ACID) 500 MG tablet, Take 500 mg by mouth daily., Disp: , Rfl:   Social History   Tobacco Use  Smoking Status Former   Packs/day: 0.25   Years: 9.00   Total pack years: 2.25   Types: Cigarettes   Quit date: 05/26/1976   Years since quitting: 45.8  Smokeless Tobacco Never    Allergies  Allergen Reactions   Strawberry (Diagnostic) Anaphylaxis and Other (See Comments)    hives   Farxiga [Dapagliflozin] Other (See Comments)    Had HAs, weakness, rhinorrhea, jitteriness, nasal congestion, nocturia, yeast  infections, memory changes, irritability.   Penicillins Itching    Has patient had a PCN reaction causing immediate rash, facial/tongue/throat swelling, SOB or lightheadedness with hypotension: No Has patient had a PCN reaction causing severe rash involving mucus membranes or skin necrosis: No Has patient had a PCN reaction that required hospitalization: Unknown Has patient had a PCN reaction occurring within the last 10 years: No If all of the above answers are "NO", then may proceed with Cephalosporin use.   Tape Other (See Comments)    Blisters   Gabapentin Itching, Rash and Other (See Comments)    Incoherent   Objective:  There were no vitals filed for this visit. There is no height or weight on file to calculate BMI. Constitutional Well developed. Well nourished.  Vascular Dorsalis pedis pulses palpable bilaterally. Posterior tibial pulses palpable bilaterally. Capillary refill normal to all digits.  No cyanosis or clubbing noted. Pedal hair growth normal.  Neurologic Normal speech. Oriented to person, place, and time. Epicritic sensation to light touch grossly present bilaterally.  Dermatologic Nails well groomed and normal in appearance. No open wounds. No skin lesions.  Orthopedic: Normal joint ROM without pain or crepitus bilaterally. No visible deformities. Tender to palpation at the midfoot plantar right. No pain with calcaneal squeeze right. Ankle ROM diminished range of motion right. Silfverskiold Test: positive right.   Radiographs: None  Assessment:   1. Plantar fasciitis, right    Plan:  Patient was evaluated and treated and all questions answered.  Plantar Fasciitis, right plantar midfoot - XR reviewed as above.  - Educated on icing and stretching. Instructions given.  - Injection delivered to the plantar fascia as below. - DME: Plantar fascial brace dispensed to support the medial longitudinal arch of the foot and offload pressure from the heel and  prevent arch collapse during weightbearing - Pharmacologic management: None  Procedure: Injection Tendon/Ligament Location: Right plantar fascia at the glabrous junction; medial approach. Skin Prep: alcohol Injectate: 0.5 cc 0.5% marcaine plain, 0.5 cc of 1% Lidocaine, 0.5 cc kenalog 10. Disposition: Patient tolerated procedure well. Injection site dressed with a band-aid.  No follow-ups on file.

## 2022-03-17 ENCOUNTER — Other Ambulatory Visit: Payer: Self-pay | Admitting: Family Medicine

## 2022-03-18 ENCOUNTER — Telehealth: Payer: Self-pay | Admitting: Pharmacist

## 2022-03-18 NOTE — Chronic Care Management (AMB) (Signed)
Chronic Care Management Pharmacy Assistant   Name: Beth Macias  MRN: [id number] DOB: November 09, 1950  Reason for Encounter: Disease State / Hypertension Assessment Call   Conditions to be addressed/monitored: HTN  Recent office visits:  02/07/2022 Grier Mitts - Patient was seen for Type 2 diabetes mellitus with other specified complication, without long-term current use of insulin and additional concerns. Started Flonase NS. Increased Bystolic to 10 mg daily. Follow up in 2 months.   Recent consult visits:  03/13/2022 Boneta Lucks DPM (podiatry) - Patient was seen for plantar fascitis right. Started Allopurinol 100 mg daily. No follow up noted.   02/27/2022 Mosetta Anis - Patient was seen for allergic rhinitis due to pollen and additional concerns. No additional chart notes.   Hospital visits:  None  Medications: Outpatient Encounter Medications as of 03/18/2022  Medication Sig   albuterol (VENTOLIN HFA) 108 (90 Base) MCG/ACT inhaler INHALE 2 PUFFS INTO THE LUNGS EVERY 6 HOURS AS NEEDED FOR WHEEZE OR SHORTNESS OF BREATH   allopurinol (ZYLOPRIM) 100 MG tablet Take 1 tablet (100 mg total) by mouth daily. (Patient not taking: Reported on 01/29/2022)   allopurinol (ZYLOPRIM) 100 MG tablet Take 1 tablet (100 mg total) by mouth daily.   blood glucose meter kit and supplies KIT Dispense based on patient and insurance preference. Use up to four times daily as directed.   Blood Pressure Monitoring (BLOOD PRESSURE CUFF) MISC For daily monitoring as directed.   Calcium-Magnesium-Vitamin D (CALCIUM 1200+D3 PO) Take 600 mg by mouth 2 (two) times daily.   cholecalciferol (VITAMIN D) 25 MCG (1000 UNIT) tablet Take 1,000 Units by mouth at bedtime.    Cyanocobalamin (CVS B12 GUMMIES PO) Take 1,000 mcg by mouth daily.   famotidine (PEPCID) 40 MG tablet Take 1 tablet (40 mg total) by mouth in the morning.   fluticasone (FLONASE) 50 MCG/ACT nasal spray Place 1 spray into both nostrils daily.    ipratropium-albuterol (DUONEB) 0.5-2.5 (3) MG/3ML SOLN Take 3 mLs by nebulization every 6 (six) hours as needed (shortness of breath/wheezing).   levocetirizine (XYZAL) 5 MG tablet Take 5 mg by mouth daily.   magnesium oxide (MAG-OX) 400 MG tablet Take 400 mg by mouth as needed.   mometasone-formoterol (DULERA) 100-5 MCG/ACT AERO Inhale 2 puffs into the lungs 2 (two) times daily.   montelukast (SINGULAIR) 10 MG tablet Take 1 tablet (10 mg total) by mouth daily.   nebivolol (BYSTOLIC) 10 MG tablet Take 1 tablet (10 mg total) by mouth daily.   ONETOUCH ULTRA test strip USE TO TEST BLOOD SUGAR UP TO FOUR TIMES DAILY   potassium chloride 20 MEQ/15ML (10%) SOLN Take 15 mLs (20 mEq total) by mouth daily as needed (with each dose of torsemide).   sacubitril-valsartan (ENTRESTO) 49-51 MG Take 1 tablet by mouth 2 (two) times daily.   torsemide (DEMADEX) 20 MG tablet Take 1 tablet (20 mg total) by mouth daily as needed (swelling/fluid/weight gain).   vitamin C (ASCORBIC ACID) 500 MG tablet Take 500 mg by mouth daily.   No facility-administered encounter medications on file as of 03/18/2022.  Fill History:  TORSEMIDE 20 MG TABLET 06/05/2021 90   ENTRESTO 49 MG-51 MG TABLET 06/05/2021 90   POTASSIUM CL 10% (20 MEQ/15ML) 12/03/2021 30   NEBIVOLOL 5 MG TABLET 03/13/2022 90   MONTELUKAST SOD 10 MG TABLET 02/11/2022 90   DULERA 100 MCG-5 MCG INHALER 09/06/2021 30   LEVOCETIRIZINE 5MG TABLETS 02/09/2020 90   IPRATROPI/ALB 0.5/3MG INH SL 30X3ML 10/25/2020 11  FLUTICASONE PROP 50 MCG SPRAY 02/11/2022 60   FAMOTIDINE 40 MG TABLET 02/03/2022 90   ALLOPURINOL 100MG TABLETS 03/13/2022 30   ALBUTEROL HFA 90 MCG INHALER 10/01/2021 30   Reviewed chart prior to disease state call. Spoke with patient regarding BP  Recent Office Vitals: BP Readings from Last 3 Encounters:  02/07/22 (!) 148/100  08/30/21 122/82  07/12/21 130/70   Pulse Readings from Last 3 Encounters:  02/07/22 60  08/30/21 69   07/12/21 (!) 55    Wt Readings from Last 3 Encounters:  02/07/22 215 lb (97.5 kg)  09/26/21 223 lb (101.2 kg)  09/18/21 203 lb (92.1 kg)     Kidney Function Lab Results  Component Value Date/Time   CREATININE 0.70 07/12/2021 10:16 AM   CREATININE 1.01 (H) 11/06/2020 11:14 AM   GFR 86.87 07/12/2021 10:16 AM   GFRNONAA 60 (L) 11/06/2020 11:14 AM   GFRAA 86 02/29/2020 10:10 AM       Latest Ref Rng & Units 07/12/2021   10:16 AM 11/06/2020   11:14 AM 10/29/2020   12:21 AM  BMP  Glucose 70 - 99 mg/dL 130  124  206   BUN 6 - 23 mg/dL '21  19  22   ' Creatinine 0.40 - 1.20 mg/dL 0.70  1.01  0.90   Sodium 135 - 145 mEq/L 141  137  138   Potassium 3.5 - 5.1 mEq/L 3.4  3.2  3.3   Chloride 96 - 112 mEq/L 101  94  99   CO2 19 - 32 mEq/L 36  33  29   Calcium 8.4 - 10.5 mg/dL 10.3  9.8  9.5     Current antihypertensive regimen:  Bystolic 10 mg daily Entresto 49/51 mg twice daily  How often are you checking your Blood Pressure? Patient hasn't gotten a new blood pressure cuff. She plans to get a new cuff soon.   Current home BP readings: No new blood pressure readings.   What recent interventions/DTPs have been made by any provider to improve Blood Pressure control since last CPP Visit: Increase Bystolic to 10 mg daily.   Any recent hospitalizations or ED visits since last visit with CPP? No recent hospital visits  What diet changes have been made to improve Blood Pressure Control?  Patient follows no specific way of eating Breakfast / Lunch - patient will have a variety, eggs, sausage, grits, yogurt and whole wheat bread Dinner- patient will have meat, two vegetables and sometimes a desert  What exercise is being done to improve your Blood Pressure Control?  Patient will sometimes walk and tries to get to the health club regular and do water exercise.   Adherence Review: Is the patient currently on ACE/ARB medication? No Does the patient have >5 day gap between last estimated fill  dates? No  Care Gaps: AWV - scheduled 09/22/2022 Last BP - 122/82 on 08/30/2021 Last A1C - 6.8 on 07/12/2021 AWV - never done - completed 09/18/2021 Foot exam - never done Urice ACR - never done Shingrix - never done Covid - overdue Eye exam - overdue Flu - due  Star Rating Drugs: None  [place] Pharmacist Assistant [phone]

## 2022-03-27 ENCOUNTER — Encounter: Payer: Medicare (Managed Care) | Attending: Family Medicine | Admitting: Dietician

## 2022-03-27 ENCOUNTER — Encounter: Payer: Self-pay | Admitting: Dietician

## 2022-03-27 VITALS — Wt 218.0 lb

## 2022-03-27 DIAGNOSIS — E119 Type 2 diabetes mellitus without complications: Secondary | ICD-10-CM | POA: Diagnosis not present

## 2022-03-27 NOTE — Patient Instructions (Addendum)
Great job with all of the changes you have made!   [place] for joining! Keep active.      Look up Chia seed pudding or simple add 2 T chia seeds to 1/2 cup almond milk and refrigerate overnight   Mash berries, cook it down, add chia seeds and cook until thick. - this makes a no sugar added jam and can be used in your oatmeal or on toast.  Keeps for 1 week in the refrigerator.   Mindfulness:             Choices             Eat slowly away from distraction             Listen to your body.  When are you satisfied?             Are you really hungry for the snack or eating for another reason?   When you feel stress, rather than emotional eating, consider doing a craft or garden.  Books you may like: Transforming Trauma by Marsa Aris, MD How Not to Age by Dr. Alden Benjamin Dopamine Nation by Alinda Deem, MD

## 2022-03-27 NOTE — Progress Notes (Signed)
Patient is here today alone. She was last seen by this RD on 12/26/2021.   Diabetes Self-Management Education  Visit Type:  Follow-up  Appt. Start Time: 1405 Appt. End Time: 3235  03/27/2022  [name], identified by name and date of birth, is a 71 y.o. female with a diagnosis of Diabetes: Type 2.    ASSESSMENT  She went on vacation last week. She states that she needs to focus and eat well She has been drinking more water and sparkling water.  Avoiding sodas. She is still having some cravings and eating ice cream at times. Her garden is doing well. Going to [employer] but holding on the pool since she got a wound on her side. She states that she knows what to do and is working to have a better way of eating overall. She has lost weight. She states that after this visit, she will call for any needed appointment for follow up.' She has lost 5 lbs in the last 4 months. No new A1C at this time. Continue to drink ginger/tumeric tea and feels better with this.  History includes Type 2 Diabetes, HLD, HTN, GERD, CHF Labs noted to include:  A1C 6.8% 07/12/2021 and cholesterol 213, HDL 59, LDL 127, Triglycerides 135 on 07/12/2021. Medications include Torsemide prn, potassium when she takes torsemide, vitamin B-12, vitamin D, Vitamin C, and magnesium   Weight hx: 218 lbs 03/27/2022 223 lbs 09/26/2021 Weight overall is stable Goal <200 lbs Lowest adult weight 135 lbs and was this weight for years and gained a lot after pregnancy.   Patient lives with her daughter.  They share shopping and cooking. She has a small garden. She moved from [place] in 2017 and states that she used a pool there and stated that she had a healthier life. Pecan allergy, avoids pork, avoids added salt and uses a small amount in cooking. She is a retired Biochemist, clinical. Emotional eater.   Weight 218 lb (98.9 kg). Body mass index is 41.19 kg/m.    Diabetes Self-Management Education - 03/27/22 1500        Psychosocial Assessment   Patient Belief/Attitude about Diabetes Motivated to manage diabetes    What is the hardest part about your diabetes right now, causing you the most concern, or is the most worrisome to you about your diabetes?   Making healty food and beverage choices    Patient Concerns Nutrition/Meal planning    Special Needs None      Pre-Education Assessment   Patient understands the diabetes disease and treatment process. Demonstrates understanding / competency    Patient understands incorporating nutritional management into lifestyle. Comprehends key points    Patient undertands incorporating physical activity into lifestyle. Demonstrates understanding / competency    Patient understands using medications safely. Demonstrates understanding / competency    Patient understands monitoring blood glucose, interpreting and using results Demonstrates understanding / competency    Patient understands prevention, detection, and treatment of acute complications. Demonstrates understanding / competency    Patient understands prevention, detection, and treatment of chronic complications. Demonstrates understanding / competency    Patient understands how to develop strategies to address psychosocial issues. Demonstrates understanding / competency    Patient understands how to develop strategies to promote health/change behavior. Comprehends key points      Activity / Exercise   Activity / Exercise Type Light (walking / raking leaves)    How many days per week do you exercise? 3    How many minutes per  day do you exercise? 45    Total minutes per week of exercise 135      Patient Education   Previous Diabetes Education Yes (please comment)    Healthy Eating Meal options for control of blood glucose level and chronic complications.;Other (comment)   psychology of food addiction   Being Active Role of exercise on diabetes management, blood pressure control and cardiac health.       Individualized Goals (developed by patient)   Nutrition General guidelines for healthy choices and portions discussed    Physical Activity Exercise 3-5 times per week;45 minutes per day    Medications take my medication as prescribed    Monitoring  Test my blood glucose as discussed    Problem Solving Eating Pattern      Patient Self-Evaluation of Goals - Patient rates self as meeting previously set goals (% of time)   Nutrition >75% (most of the time)    Physical Activity 50 - 75 % (half of the time)    Medications >75% (most of the time)    Monitoring Not Applicable    Problem Solving and behavior change strategies  >75% (most of the time)    Reducing Risk (treating acute and chronic complications) >40% (most of the time)    Health Coping >75% (most of the time)      Post-Education Assessment   Patient understands the diabetes disease and treatment process. Demonstrates understanding / competency    Patient understands incorporating nutritional management into lifestyle. Demonstrates understanding / competency    Patient undertands incorporating physical activity into lifestyle. Demonstrates understanding / competency    Patient understands using medications safely. Demonstrates understanding / competency    Patient understands monitoring blood glucose, interpreting and using results Demonstrates understanding / competency    Patient understands prevention, detection, and treatment of acute complications. Demonstrates understanding / competency    Patient understands prevention, detection, and treatment of chronic complications. Demonstrates understanding / competency    Patient understands how to develop strategies to address psychosocial issues. Demonstrates understanding / competency    Patient understands how to develop strategies to promote health/change behavior. Demonstrates understanding / competency      Outcomes   Program Status Completed      Subsequent Visit   Since your  last visit have you continued or begun to take your medications as prescribed? Yes    Since your last visit have you experienced any weight changes? Loss    Weight Loss (lbs) 5             Learning Objective:  Patient will have a greater understanding of diabetes self-management. Patient education plan is to attend individual and/or group sessions per assessed needs and concerns.   Plan:   Patient Instructions  Doristine Devoid job with all of the changes you have made!   [place] for joining! Keep active.      Look up Chia seed pudding or simple add 2 T chia seeds to 1/2 cup almond milk and refrigerate overnight   Mash berries, cook it down, add chia seeds and cook until thick. - this makes a no sugar added jam and can be used in your oatmeal or on toast.  Keeps for 1 week in the refrigerator.   Mindfulness:             Choices             Eat slowly away from distraction  Listen to your body.  When are you satisfied?             Are you really hungry for the snack or eating for another reason?   When you feel stress, rather than emotional eating, consider doing a craft or garden.   Expected Outcomes:  Demonstrated interest in learning. Expect positive outcomes  Education material provided: [employer] of Lifestyle Medicine packet  If problems or questions, patient to contact team via:  Phone  Future DSME appointment: - PRN

## 2022-04-09 ENCOUNTER — Encounter: Payer: Self-pay | Admitting: Podiatry

## 2022-04-09 ENCOUNTER — Ambulatory Visit (INDEPENDENT_AMBULATORY_CARE_PROVIDER_SITE_OTHER): Payer: Medicare (Managed Care) | Admitting: Podiatry

## 2022-04-09 DIAGNOSIS — E119 Type 2 diabetes mellitus without complications: Secondary | ICD-10-CM | POA: Diagnosis not present

## 2022-04-09 DIAGNOSIS — D2371 Other benign neoplasm of skin of right lower limb, including hip: Secondary | ICD-10-CM

## 2022-04-09 DIAGNOSIS — B351 Tinea unguium: Secondary | ICD-10-CM | POA: Diagnosis not present

## 2022-04-09 DIAGNOSIS — M79676 Pain in unspecified toe(s): Secondary | ICD-10-CM

## 2022-04-09 NOTE — Progress Notes (Signed)
She presents today chief complaint of painfully elongated toenails bilaterally.  Toenails are long thick yellow dystrophic with painful.  Assessment: Pain in limb secondary to onychomycosis.  Plan: Debridement of toenails 1 through 5 bilateral

## 2022-04-28 ENCOUNTER — Other Ambulatory Visit: Payer: Self-pay | Admitting: *Deleted

## 2022-04-28 MED ORDER — ENTRESTO 49-51 MG PO TABS: 1.0000 | ORAL_TABLET | Freq: Two times a day (BID) | ORAL | 1 refills | Status: DC

## 2022-05-20 ENCOUNTER — Other Ambulatory Visit: Payer: Self-pay

## 2022-05-20 DIAGNOSIS — J4540 Moderate persistent asthma, uncomplicated: Secondary | ICD-10-CM

## 2022-05-20 DIAGNOSIS — J3089 Other allergic rhinitis: Secondary | ICD-10-CM

## 2022-05-20 MED ORDER — MONTELUKAST SODIUM 10 MG PO TABS: 10.0000 mg | ORAL_TABLET | Freq: Every day | ORAL | 3 refills | Status: DC

## 2022-05-27 ENCOUNTER — Telehealth: Payer: Self-pay

## 2022-05-27 NOTE — Telephone Encounter (Signed)
**Note De-Identified Beth Macias Obfuscation** The pts completed NPAF application for Entresto assistance was left at the office with documents.  I have completed the providers page of her application with our DOD, Dr Tanna Furry informations (in Dr Kyla Balzarine absence) and have e-mailed the pts entire application to the nurse working with Dr Lovena Le today so she can obtain his signature, date it, and to then fax all to NPAF at the fax number written on the cover letter included.

## 2022-05-28 ENCOUNTER — Telehealth: Payer: Self-pay | Admitting: Family Medicine

## 2022-05-28 DIAGNOSIS — J3089 Other allergic rhinitis: Secondary | ICD-10-CM

## 2022-05-28 DIAGNOSIS — J4540 Moderate persistent asthma, uncomplicated: Secondary | ICD-10-CM

## 2022-05-28 MED ORDER — MONTELUKAST SODIUM 10 MG PO TABS: 10.0000 mg | ORAL_TABLET | Freq: Every day | ORAL | 3 refills | Status: DC

## 2022-05-28 NOTE — Telephone Encounter (Signed)
The Rx was sent on 05/20/2022 to [facility] for 90 days with 3 refills.   Inform patient.   Patient states she will contact the pharmacy to get an update on the status of the medication. She reports she last spoke with them and that rx was on back order. Patient will call us back with an update.

## 2022-05-28 NOTE — Telephone Encounter (Signed)
Patient asks that this does not go thru [facility] at this time, having it filled locally at [place], [place] - [address] AT [address] Phone: [phone]  Fax: [phone]

## 2022-05-28 NOTE — Telephone Encounter (Signed)
Pt called to request a refill of the following:  montelukast (SINGULAIR) 10 MG tablet   Pt no longer has [employer],  Pt now has [employer]  [id number] [address], [place] AT [address] Phone: [phone]  Fax: [phone]

## 2022-05-28 NOTE — Telephone Encounter (Signed)
Rx sent 

## 2022-06-09 ENCOUNTER — Telehealth: Payer: Medicare (Managed Care)

## 2022-06-26 NOTE — Progress Notes (Signed)
Date:  07/04/2022   ID:  Beth Macias, DOB [birth year], MRN [id number]  PCP:  Billie Ruddy, MD  Cardiologist:   Johnsie Cancel Electrophysiologist:  None   Evaluation Performed:  Follow-Up Visit  Chief Complaint:  CHF  History of Present Illness:    Beth Macias is a 72 y.o. 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       Echo done 11/16/18 reviewed EF 30-35% with moderate MR Small to moderate sized pericardial effusion Myovue done 12/13/18 normal perfusion EF 32%   Has had issues with hives since her 30's They got worse when she was started on pulmonary and CHF meds. Not clear culprit. But improved and not thought to be from heart meds. Seen 04/01/19 pharm D and entresto dose kept with increase in coreg. Patient does not like taking diuretic when she goes out Did not tolerate max dose Entresto   TTE done 05/23/20 improved EF to 50-55% with moderate Pericardial effusion no tamponade MR only trivial   TTE done 10/28/20 EF 55-60% no change in effusion  AV sclerosis  TTE 02/25/21 EF 60-65% speckled with moderate LVH AV sclerosis   Was to have cardiac MRI Amyloid Tc study  but never done to r/o amyloid   Was in hospital 6/4-6  2022 for asthma flair She road to [place] With a friend and got exposed to some allergens CXR was ok BNP was 489 and BP was elevated   Eiress  grand daughter spends time with her daily and has a new grand daughter in [place] as well  Discussed need to order Tc-pyrophosphate scan to r/o possible amyloid given TTE appearance and cardiomyopathy   Past Medical History:  Diagnosis Date   Asthma    Blood in stool    CHF (congestive heart failure) ([place])    Colon polyps    Diabetes mellitus without complication (HCC)    Diverticulitis    Fibromyalgia    GERD (gastroesophageal reflux disease)    Gout    Hypercholesteremia    Hypertension    Pneumonia    Pneumonia    UTI (urinary tract infection)    Past Surgical  History:  Procedure Laterality Date   APPENDECTOMY     CESAREAN SECTION     x 3   LIPOMA EXCISION     MENISCUS REPAIR Left    OVARIAN CYST REMOVAL     OVARIAN CYST SURGERY       Current Meds  Medication Sig   albuterol (VENTOLIN HFA) 108 (90 Base) MCG/ACT inhaler INHALE 2 PUFFS INTO THE LUNGS EVERY 6 HOURS AS NEEDED FOR WHEEZE OR SHORTNESS OF BREATH   allopurinol (ZYLOPRIM) 100 MG tablet Take 1 tablet (100 mg total) by mouth daily.   blood glucose meter kit and supplies KIT Dispense based on patient and insurance preference. Use up to four times daily as directed.   Blood Pressure Monitoring (BLOOD PRESSURE CUFF) MISC For daily monitoring as directed.   Calcium-Magnesium-Vitamin D (CALCIUM 1200+D3 PO) Take 600 mg by mouth daily.   cholecalciferol (VITAMIN D) 25 MCG (1000 UNIT) tablet Take 1,000 Units by mouth at bedtime.    Cyanocobalamin (CVS B12 GUMMIES PO) Take 1,000 mcg by mouth daily.   famotidine (PEPCID) 40 MG tablet Take 1 tablet (40 mg total) by mouth in the morning.   fluticasone (FLONASE) 50 MCG/ACT nasal spray Place 1 spray into both nostrils daily.   ipratropium-albuterol (DUONEB) 0.5-2.5 (3) MG/3ML SOLN Take  3 mLs by nebulization every 6 (six) hours as needed (shortness of breath/wheezing).   levocetirizine (XYZAL) 5 MG tablet Take 5 mg by mouth daily.   magnesium oxide (MAG-OX) 400 MG tablet Take 400 mg by mouth as needed.   mometasone-formoterol (DULERA) 100-5 MCG/ACT AERO Inhale 2 puffs into the lungs 2 (two) times daily.   montelukast (SINGULAIR) 10 MG tablet Take 1 tablet (10 mg total) by mouth daily.   nebivolol (BYSTOLIC) 10 MG tablet Take 1 tablet (10 mg total) by mouth daily.   ONETOUCH ULTRA test strip USE TO TEST BLOOD SUGAR UP TO FOUR TIMES DAILY   potassium chloride 20 MEQ/15ML (10%) SOLN Take 15 mLs (20 mEq total) by mouth daily as needed (with each dose of torsemide).   sacubitril-valsartan (ENTRESTO) 49-51 MG Take 1 tablet by mouth 2 (two) times daily.  Patient needs appointment for any future refills. Please call office at [phone] to schedule appointment.   torsemide (DEMADEX) 20 MG tablet Take 1 tablet (20 mg total) by mouth daily as needed (swelling/fluid/weight gain).   vitamin C (ASCORBIC ACID) 500 MG tablet Take 500 mg by mouth daily.     Allergies:   Strawberry (diagnostic), Farxiga [dapagliflozin], Penicillins, Gabapentin, and Tape   Social History   Tobacco Use   Smoking status: Former    Packs/day: 0.25    Years: 9.00    Total pack years: 2.25    Types: Cigarettes    Quit date: 05/26/1976    Years since quitting: 46.1   Smokeless tobacco: Never  Vaping Use   Vaping Use: Never used  Substance Use Topics   Alcohol use: Never   Drug use: Never     Family Hx: The patient's family history includes AAA (abdominal aortic aneurysm) in her father; Colon cancer in her maternal uncle; Crohn's disease in her paternal aunt; Diabetes in her mother; Gout in her brother and mother; Heart attack in her brother; Heart failure in her father and mother; Hypertension in her brother, father, and mother. There is no history of Breast cancer, Esophageal cancer, Pancreatic cancer, or Stomach cancer.  ROS:   Please see the history of present illness.     All other systems reviewed and are negative.   Prior CV studies:   The following studies were reviewed today:  Echo 11/16/18 Echo 05/23/20  Echo 10/28/20  Echo 02/25/21  Myovue 12/13/18  LE venous duplex 04/29/19   Labs/Other Tests and Data Reviewed:    EKG:  07/04/2022 SR rate 65 poor R wave progression 07/04/2022 SR rate 68 LAD poor R wave progression   Recent Labs: 07/12/2021: ALT 10; BUN 21; Creatinine, Ser 0.70; Hemoglobin 13.4; Platelets 237.0; Potassium 3.4; Sodium 141; TSH 0.50   Recent Lipid Panel Lab Results  Component Value Date/Time   CHOL 213 (H) 07/12/2021 10:16 AM   CHOL 191 02/29/2020 10:10 AM   TRIG 135.0 07/12/2021 10:16 AM   HDL 59.50 07/12/2021 10:16 AM   HDL 59  02/29/2020 10:10 AM   CHOLHDL 4 07/12/2021 10:16 AM   LDLCALC 127 (H) 07/12/2021 10:16 AM   LDLCALC 109 (H) 02/29/2020 10:10 AM    Wt Readings from Last 3 Encounters:  07/04/22 225 lb (102.1 kg)  03/27/22 218 lb (98.9 kg)  02/07/22 215 lb (97.5 kg)     Objective:    Vital Signs:  Pulse 66   Ht 5' 2"$  (1.575 m)   Wt 225 lb (102.1 kg)   SpO2 99%   BMI 41.15 kg/m  Telephone no exam     ASSESSMENT & PLAN:    CHF:  Non ischemic DCM continue coreg, and entresto On demedex ( hives when on lasix/aldactone) she takes her diuretic when not going out for the day  No changes for now EF improved by TTE 16/5/22 55-60% ? Amyloid given speckled pattern and moderate LVH Tc - P  Pulmonary:  F/u Wert continue symbicort and pro Air  LE venous duplex 04/29/19 showed no DVT  GERD:  Continue prilosec   Pericardial Effusion:  Asymptomatic  stable over serial echos f/u echo as needed and in a year if no symptoms for DCM     Medication Adjustments/Labs and Tests Ordered: Current medicines are reviewed at length with the patient today.  Concerns regarding medicines are outlined above.   Tests Ordered:  Tc Pyrophosphate scan r/o amylpid  Medication Changes:  None      F/U in  6 months   Signed, Jenkins Rouge, MD  07/04/2022 8:53 AM    [place]

## 2022-07-02 ENCOUNTER — Other Ambulatory Visit: Payer: Self-pay | Admitting: Family Medicine

## 2022-07-02 DIAGNOSIS — Z1231 Encounter for screening mammogram for malignant neoplasm of breast: Secondary | ICD-10-CM

## 2022-07-04 ENCOUNTER — Encounter: Payer: Self-pay | Admitting: Cardiovascular Disease

## 2022-07-04 ENCOUNTER — Ambulatory Visit: Payer: Medicare PPO | Attending: Cardiovascular Disease | Admitting: Cardiovascular Disease

## 2022-07-04 VITALS — BP 128/78 | HR 66 | Ht 62.0 in | Wt 225.0 lb

## 2022-07-04 DIAGNOSIS — I10 Essential (primary) hypertension: Secondary | ICD-10-CM | POA: Diagnosis not present

## 2022-07-04 DIAGNOSIS — E859 Amyloidosis, unspecified: Secondary | ICD-10-CM | POA: Diagnosis not present

## 2022-07-04 DIAGNOSIS — I5032 Chronic diastolic (congestive) heart failure: Secondary | ICD-10-CM

## 2022-07-04 DIAGNOSIS — I420 Dilated cardiomyopathy: Secondary | ICD-10-CM

## 2022-07-04 DIAGNOSIS — E119 Type 2 diabetes mellitus without complications: Secondary | ICD-10-CM

## 2022-07-04 NOTE — Patient Instructions (Signed)
Medication Instructions:  Your physician recommends that you continue on your current medications as directed. Please refer to the Current Medication list given to you today.  *If you need a refill on your cardiac medications before your next appointment, please call your pharmacy*  Lab Work: If you have labs (blood work) drawn today and your tests are completely normal, you will receive your results only by: [place] (if you have MyChart) OR A paper copy in the mail If you have any lab test that is abnormal or we need to change your treatment, we will call you to review the results.  Testing/Procedures: None ordered today. Please ask your insurance about myocardial amyloid imaging planar and spect.  Follow-Up: At [facility], you and your health needs are our priority.  As part of our continuing mission to provide you with exceptional heart care, we have created designated Provider Care Teams.  These Care Teams include your primary Cardiologist (physician) and Advanced Practice Providers (APPs -  Physician Assistants and Nurse Practitioners) who all work together to provide you with the care you need, when you need it.  We recommend signing up for the patient portal called "MyChart".  Sign up information is provided on this After Visit Summary.  MyChart is used to connect with patients for Virtual Visits (Telemedicine).  Patients are able to view lab/test results, encounter notes, upcoming appointments, etc.  Non-urgent messages can be sent to your provider as well.   To learn more about what you can do with MyChart, go to [email or web address].    Your next appointment:   6 month(s)  Provider:   Jenkins Rouge, MD

## 2022-07-14 ENCOUNTER — Encounter: Payer: Medicare PPO | Admitting: Family Medicine

## 2022-07-16 ENCOUNTER — Ambulatory Visit: Payer: Medicare (Managed Care) | Admitting: Podiatry

## 2022-07-17 ENCOUNTER — Telehealth: Payer: Self-pay

## 2022-07-17 NOTE — Progress Notes (Addendum)
Care Management & Coordination Services Pharmacy Team  Reason for Encounter: Hypertension  Contacted patient to discuss hypertension disease state. Spoke with patient on 07/17/2022    Current antihypertensive regimen:  Bystolic 10 mg daily Entresto 49/51 mg twice daily Patient verbally confirms she is taking the above medications as directed. Yes  How often are you checking your Blood Pressure? Patient does not check her blood pressures at home, she does not have a blood pressure cuff.   OTC medications including pseudoephedrine or NSAIDs? Patient denies  Any readings above 180/100? Patient is not checking blood pressures  What recent interventions/DTPs have been made by any provider to improve Blood Pressure control since last CPP Visit: No recent interventions.   Any recent hospitalizations or ED visits since last visit with CPP? No recent hospital visits.   What diet changes have been made to improve Blood Pressure Control?  Patient follows no specific way of eating Breakfast / Lunch - patient will have a variety, eggs, sausage, grits, yogurt and whole wheat bread Dinner- patient will have meat, two vegetables and sometimes a desert Caffeine intake: patient drinks tea on occasion Salt intake: low sodium  What exercise is being done to improve your Blood Pressure Control?  Patient will sometimes walk and tries to get to the health club regular and do water exercise.   Adherence Review: Is the patient currently on ACE/ARB medication? No Does the patient have >5 day gap between last estimated fill dates? No  Care Gaps: AWV - completed 09/18/2021, scheduled 09/22/2022 Next appointment - 09/29/2022 Foot exam - never done Eye exam - completed 11/23/2019 Urine ACR - never done Shingrix - never done Flu - overdue Covid - overdue eGFR - overdue   Star Rating Drugs: None  Chart Updates: Recent office visits:  None  Recent consult visits:  07/04/2022 Jenkins Rouge MD  (cardiology) - Patient was seen for hypertension and additional concerns. Continued Allopurinol 100 mg daily.   04/09/2022 Max Hyatt DPM - Patient was seen for Type 2 diabetes mellitus without complication, without long-term current use of insulin and additional concerns. No medication changes.   03/27/2022 Antonieta Iba RD - Patient was seen for Diabetes mellitus without complication. No medication changes.  Hospital visits:  None  Medications: Outpatient Encounter Medications as of 07/17/2022  Medication Sig   albuterol (VENTOLIN HFA) 108 (90 Base) MCG/ACT inhaler INHALE 2 PUFFS INTO THE LUNGS EVERY 6 HOURS AS NEEDED FOR WHEEZE OR SHORTNESS OF BREATH   allopurinol (ZYLOPRIM) 100 MG tablet Take 1 tablet (100 mg total) by mouth daily.   blood glucose meter kit and supplies KIT Dispense based on patient and insurance preference. Use up to four times daily as directed.   Blood Pressure Monitoring (BLOOD PRESSURE CUFF) MISC For daily monitoring as directed.   Calcium-Magnesium-Vitamin D (CALCIUM 1200+D3 PO) Take 600 mg by mouth daily.   cholecalciferol (VITAMIN D) 25 MCG (1000 UNIT) tablet Take 1,000 Units by mouth at bedtime.    Cyanocobalamin ([employer] B12 GUMMIES PO) Take 1,000 mcg by mouth daily.   famotidine (PEPCID) 40 MG tablet Take 1 tablet (40 mg total) by mouth in the morning.   fluticasone (FLONASE) 50 MCG/ACT nasal spray Place 1 spray into both nostrils daily.   ipratropium-albuterol (DUONEB) 0.5-2.5 (3) MG/3ML SOLN Take 3 mLs by nebulization every 6 (six) hours as needed (shortness of breath/wheezing).   levocetirizine (XYZAL) 5 MG tablet Take 5 mg by mouth daily.   magnesium oxide (MAG-OX) 400 MG tablet Take 400  mg by mouth as needed.   mometasone-formoterol (DULERA) 100-5 MCG/ACT AERO Inhale 2 puffs into the lungs 2 (two) times daily.   montelukast (SINGULAIR) 10 MG tablet Take 1 tablet (10 mg total) by mouth daily.   nebivolol (BYSTOLIC) 10 MG tablet Take 1 tablet (10 mg total) by mouth  daily.   ONETOUCH ULTRA test strip USE TO TEST BLOOD SUGAR UP TO FOUR TIMES DAILY   potassium chloride 20 MEQ/15ML (10%) SOLN Take 15 mLs (20 mEq total) by mouth daily as needed (with each dose of torsemide).   sacubitril-valsartan (ENTRESTO) 49-51 MG Take 1 tablet by mouth 2 (two) times daily. Patient needs appointment for any future refills. Please call office at [phone] to schedule appointment.   torsemide (DEMADEX) 20 MG tablet Take 1 tablet (20 mg total) by mouth daily as needed (swelling/fluid/weight gain).   vitamin C (ASCORBIC ACID) 500 MG tablet Take 500 mg by mouth daily.   No facility-administered encounter medications on file as of 07/17/2022.  Fill History:  Dispensed Days Supply Quantity Provider Pharmacy  ALBUTEROL HFA 90 MCG INHALER 10/01/2021 30 8 each      Dispensed Days Supply Quantity Provider Pharmacy  ALLOPURINOL 100 MG TABLET 06/10/2022 30  Felipa Furnace, DPM [name]  ALLOPURINOL 100MG TABLETS 05/12/2022 30 30 each      Dispensed Days Supply Quantity Provider Pharmacy  FAMOTIDINE 40 MG TABLET 05/17/2022 90 180 tablet      Dispensed Days Supply Quantity Provider Pharmacy  FLUTICASONE PROP 50 MCG SPRAY 04/01/2022 60 16 spray      Dispensed Days Supply Quantity Provider Pharmacy  LEVOCETIRIZINE 5MG TABLETS 02/09/2020 90 90 each      Dispensed Days Supply Quantity Provider Pharmacy  DULERA 100 MCG-5 MCG INHALER 09/06/2021 30 13 each      Dispensed Days Supply Quantity Provider Pharmacy  montelukast 10 mg tablet 05/26/2022 90 90 tablet      Dispensed Days Supply Quantity Provider Pharmacy  NEBIVOLOL 5 MG TABLET 06/20/2022 90  Josue Hector, MD [name]  NEBIVOLOL 5 MG TABLET 06/08/2022 90  Josue Hector, MD [name]  NEBIVOLOL 10 MG TABLET 05/13/2022 90 90 tablet      Dispensed Days Supply Quantity Provider Pharmacy  ENTRESTO 49 MG-51 MG TABLET 06/05/2021 90 180 tablet      Dispensed Days Supply  Quantity Provider Pharmacy  TORSEMIDE 20 MG TABLET 06/05/2021 90 90 tablet      Recent Office Vitals: BP Readings from Last 3 Encounters:  07/04/22 128/78  02/07/22 (!) 148/100  08/30/21 122/82   Pulse Readings from Last 3 Encounters:  07/04/22 66  02/07/22 60  08/30/21 69    Wt Readings from Last 3 Encounters:  07/04/22 225 lb (102.1 kg)  03/27/22 218 lb (98.9 kg)  02/07/22 215 lb (97.5 kg)     Kidney Function Lab Results  Component Value Date/Time   CREATININE 0.70 07/12/2021 10:16 AM   CREATININE 1.01 (H) 11/06/2020 11:14 AM   GFR 86.87 07/12/2021 10:16 AM   GFRNONAA 60 (L) 11/06/2020 11:14 AM   GFRAA 86 02/29/2020 10:10 AM       Latest Ref Rng & Units 07/12/2021   10:16 AM 11/06/2020   11:14 AM 10/29/2020   12:21 AM  BMP  Glucose 70 - 99 mg/dL 130  124  206   BUN 6 - 23 mg/dL 21  19  22   $ Creatinine 0.40 - 1.20 mg/dL 0.70  1.01  0.90  Sodium 135 - 145 mEq/L 141  137  138   Potassium 3.5 - 5.1 mEq/L 3.4  3.2  3.3   Chloride 96 - 112 mEq/L 101  94  99   CO2 19 - 32 mEq/L 36  33  29   Calcium 8.4 - 10.5 mg/dL 10.3  9.8  9.5    [place] Pharmacist Assistant [phone]

## 2022-07-24 ENCOUNTER — Telehealth: Payer: Self-pay | Admitting: Family Medicine

## 2022-07-24 DIAGNOSIS — I10 Essential (primary) hypertension: Secondary | ICD-10-CM

## 2022-07-24 MED ORDER — NEBIVOLOL HCL 10 MG PO TABS: 10.0000 mg | ORAL_TABLET | Freq: Every day | ORAL | 0 refills | Status: DC

## 2022-07-24 NOTE — Telephone Encounter (Signed)
Prescription Request  07/24/2022   LOV: 02/07/2022  What is the name of the medication or equipment? nebivolol (BYSTOLIC) 10 MG tablet  Have you contacted your pharmacy to request a refill? No   Which pharmacy would you like this sent to?  [name] - [place], [place] - [address] [place] [address] Phone: [phone] Fax: [phone]   Patient notified that their request is being sent to the clinical staff for review and that they should receive a response within 2 business days.   Please advise at Mobile [phone] (mobile)

## 2022-07-24 NOTE — Telephone Encounter (Signed)
Rx sent 

## 2022-07-28 ENCOUNTER — Encounter: Payer: Medicare PPO | Admitting: Family Medicine

## 2022-08-01 ENCOUNTER — Ambulatory Visit (INDEPENDENT_AMBULATORY_CARE_PROVIDER_SITE_OTHER): Payer: Medicare PPO | Admitting: Family Medicine

## 2022-08-01 ENCOUNTER — Encounter: Payer: Self-pay | Admitting: Family Medicine

## 2022-08-01 VITALS — BP 150/80 | HR 69 | Temp 98.3°F | Ht 60.5 in | Wt 227.4 lb

## 2022-08-01 DIAGNOSIS — I10 Essential (primary) hypertension: Secondary | ICD-10-CM

## 2022-08-01 DIAGNOSIS — Z0001 Encounter for general adult medical examination with abnormal findings: Secondary | ICD-10-CM | POA: Diagnosis not present

## 2022-08-01 DIAGNOSIS — Z0000 Encounter for general adult medical examination without abnormal findings: Secondary | ICD-10-CM

## 2022-08-01 DIAGNOSIS — E782 Mixed hyperlipidemia: Secondary | ICD-10-CM

## 2022-08-01 DIAGNOSIS — R1310 Dysphagia, unspecified: Secondary | ICD-10-CM | POA: Diagnosis not present

## 2022-08-01 DIAGNOSIS — E1169 Type 2 diabetes mellitus with other specified complication: Secondary | ICD-10-CM | POA: Diagnosis not present

## 2022-08-01 DIAGNOSIS — E041 Nontoxic single thyroid nodule: Secondary | ICD-10-CM | POA: Diagnosis not present

## 2022-08-01 LAB — CBC WITH DIFFERENTIAL/PLATELET
Basophils Absolute: 0.1 10*3/uL (ref 0.0–0.1)
Basophils Relative: 0.8 % (ref 0.0–3.0)
Eosinophils Absolute: 0.1 10*3/uL (ref 0.0–0.7)
Eosinophils Relative: 1.8 % (ref 0.0–5.0)
HCT: 41.7 % (ref 36.0–46.0)
Hemoglobin: 13.8 g/dL (ref 12.0–15.0)
Lymphocytes Relative: 22.7 % (ref 12.0–46.0)
Lymphs Abs: 1.4 10*3/uL (ref 0.7–4.0)
MCHC: 33 g/dL (ref 30.0–36.0)
MCV: 86.7 fl (ref 78.0–100.0)
Monocytes Absolute: 0.4 10*3/uL (ref 0.1–1.0)
Monocytes Relative: 6.7 % (ref 3.0–12.0)
Neutro Abs: 4.3 10*3/uL (ref 1.4–7.7)
Neutrophils Relative %: 68 % (ref 43.0–77.0)
Platelets: 257 10*3/uL (ref 150.0–400.0)
RBC: 4.81 Mil/uL (ref 3.87–5.11)
RDW: 14.5 % (ref 11.5–15.5)
WBC: 6.3 10*3/uL (ref 4.0–10.5)

## 2022-08-01 LAB — COMPREHENSIVE METABOLIC PANEL
ALT: 12 U/L (ref 0–35)
AST: 15 U/L (ref 0–37)
Albumin: 3.9 g/dL (ref 3.5–5.2)
Alkaline Phosphatase: 55 U/L (ref 39–117)
BUN: 20 mg/dL (ref 6–23)
CO2: 31 mEq/L (ref 19–32)
Calcium: 10.3 mg/dL (ref 8.4–10.5)
Chloride: 103 mEq/L (ref 96–112)
Creatinine, Ser: 0.78 mg/dL (ref 0.40–1.20)
GFR: 75.73 mL/min (ref >60.00)
Glucose, Bld: 103 mg/dL — ABNORMAL HIGH (ref 70–99)
Potassium: 3.4 mEq/L — ABNORMAL LOW (ref 3.5–5.1)
Sodium: 141 mEq/L (ref 135–145)
Total Bilirubin: 0.8 mg/dL (ref 0.2–1.2)
Total Protein: 6.8 g/dL (ref 6.0–8.3)

## 2022-08-01 LAB — MICROALBUMIN / CREATININE URINE RATIO
Creatinine,U: 189.6 mg/dL
Microalb Creat Ratio: 6.5 mg/g (ref 0.0–30.0)
Microalb, Ur: 12.4 mg/dL — ABNORMAL HIGH (ref 0.0–1.9)

## 2022-08-01 LAB — T4, FREE: Free T4: 0.77 ng/dL (ref 0.60–1.60)

## 2022-08-01 LAB — LIPID PANEL
Cholesterol: 223 mg/dL — ABNORMAL HIGH (ref 0–200)
HDL: 62.4 mg/dL (ref >39.00)
LDL Cholesterol: 133 mg/dL — ABNORMAL HIGH (ref 0–99)
NonHDL: 160.2
Total CHOL/HDL Ratio: 4
Triglycerides: 134 mg/dL (ref 0.0–149.0)
VLDL: 26.8 mg/dL (ref 0.0–40.0)

## 2022-08-01 LAB — TSH: TSH: 0.71 u[IU]/mL (ref 0.35–5.50)

## 2022-08-01 LAB — HEMOGLOBIN A1C: Hgb A1c MFr Bld: 6.8 % — ABNORMAL HIGH (ref 4.6–6.5)

## 2022-08-07 ENCOUNTER — Telehealth: Payer: Self-pay | Admitting: Family Medicine

## 2022-08-07 NOTE — Telephone Encounter (Signed)
Pt call and stated she want a call back about her labs results.

## 2022-08-08 NOTE — Telephone Encounter (Signed)
error 

## 2022-08-11 ENCOUNTER — Telehealth: Payer: Self-pay | Admitting: Cardiovascular Disease

## 2022-08-11 MED ORDER — ENTRESTO 49-51 MG PO TABS: 1.0000 | ORAL_TABLET | Freq: Two times a day (BID) | ORAL | 3 refills | Status: DC

## 2022-08-11 NOTE — Telephone Encounter (Signed)
Pt's medication was sent to pt's pharmacy as requested. Confirmation received.  °

## 2022-08-11 NOTE — Telephone Encounter (Signed)
Pt c/o medication issue:  1. Name of Medication: sacubitril-valsartan (ENTRESTO) 49-51 MG   2. How are you currently taking this medication (dosage and times per day)? As written  3. Are you having a reaction (difficulty breathing--STAT)? No   4. What is your medication issue? When refilling medication, pharmacy said that they could not refill it due to the date being absent on medication bottle. Please resend with date to [place], [place]

## 2022-08-12 ENCOUNTER — Other Ambulatory Visit: Payer: Self-pay

## 2022-08-12 DIAGNOSIS — E876 Hypokalemia: Secondary | ICD-10-CM

## 2022-08-12 MED ORDER — POTASSIUM CHLORIDE 20 MEQ/15ML (10%) PO SOLN: 20.0000 meq | Freq: Every day | ORAL | 0 refills | Status: DC | PRN

## 2022-08-15 ENCOUNTER — Ambulatory Visit
Admission: RE | Admit: 2022-08-15 | Discharge: 2022-08-15 | Disposition: A | Discharge status: Home / Self Care | Payer: Medicare PPO | Source: Ambulatory Visit | Attending: Family Medicine | Admitting: Family Medicine

## 2022-08-15 DIAGNOSIS — Z1231 Encounter for screening mammogram for malignant neoplasm of breast: Secondary | ICD-10-CM

## 2022-08-18 ENCOUNTER — Telehealth: Payer: Self-pay | Admitting: Family Medicine

## 2022-08-18 ENCOUNTER — Other Ambulatory Visit: Payer: Self-pay | Admitting: *Deleted

## 2022-08-18 NOTE — Telephone Encounter (Signed)
Pt called to request a call back from MD. Pt was asked if it was regarding a medication refill, as I could help with that. Pt stated it was regarding a special request, and would rather speak directly to MD. Pt asked that MD call back at her earliest convenience.

## 2022-08-19 NOTE — Telephone Encounter (Addendum)
Pt called again to ask if MD could please provide her with a letter stating she needs assistance to and from the gates at the airport.   Pt states she cannot walk very fast and has sciatic nerve pain and needs to be transported to and from the points of entry and exit.  Pt states she needs this letter by:  Monday, 09/08/22.  The letter should state:  To provide assistance from:    Departure date - 09/12/22 through Return date - 09/19/22  Please advise.

## 2022-08-20 NOTE — Telephone Encounter (Signed)
Dr. Johnsie Cancel has signed and paper work has been faxed.

## 2022-08-20 NOTE — Telephone Encounter (Signed)
**Note De-Identified Srikar Chiang Obfuscation** I called [employer] and s/w Lorriane Shire who advised me that the providers page of the pts application for [facility] assistance was not dated.  Per Lorriane Shire, I have completed a new MD page of a [employer] application and have e-mailed it to Dr Kyla Balzarine nurse so she can obtain his signature, date it, and to fax to [employer] at the fax number written on the cover letter included.  I have notified the pt of the above and that we are leaving her 2 weeks of Entresto 49-51 mg samples in the front office for her to pick up. She verbalized understanding to all information given and thanked me for out assistance.

## 2022-08-20 NOTE — Telephone Encounter (Signed)
Pt c/o medication issue:  1. Name of Medication:   sacubitril-valsartan (ENTRESTO) 49-51 MG   2. How are you currently taking this medication (dosage and times per day)?   As prescribed  3. Are you having a reaction (difficulty breathing--STAT)?   No  4. What is your medication issue?   Patient states she only has 4-5 days left.  Patient wants to get samples of this medication pending her application.  Patient states the application sent to [name] did not have some of the information completed.  Patient wants the application completed and re-faxed to [employer].

## 2022-08-20 NOTE — Telephone Encounter (Signed)
Leaving pt 1 bottle of Entresto 49-51 mg tablets at the front desk [facility] office, for pt pick up.  Lot# [id number]    Exp:  10/2023  2 week supply   FYI

## 2022-08-21 NOTE — Telephone Encounter (Signed)
Typically a letter is not needed, just ask for assistance at the [employer] desk at check in, but ok for letter.

## 2022-08-22 ENCOUNTER — Encounter: Payer: Self-pay | Admitting: Family Medicine

## 2022-08-25 NOTE — Telephone Encounter (Signed)
**Note De-Identified Shaylan Tutton Obfuscation** Letter received from [employer] stating that they have approved the pt for Entresto assistance until 05/26/2023. Pt ID: [id number]

## 2022-08-26 NOTE — Telephone Encounter (Signed)
Pt called, returning CMA's call. CMA was with a patient. Pt just wanted to FU on her request for a letter from MD.  Pt said she will come right over any time letter is ready for her. Pt asked that CMA call back at her earliest convenience.

## 2022-08-26 NOTE — Telephone Encounter (Signed)
Letter typed and sent to mychart. Also printed to be picked up by pt at the front desk.   Contacted pt. Pt is aware.

## 2022-08-26 NOTE — Telephone Encounter (Signed)
Spoke to pt. Pt states she is aware she doesn't need the letter for traveling within the states.   Pt states she will be going out of country to [place] for a wedding and daughter advise to get a letter.

## 2022-08-26 NOTE — Telephone Encounter (Signed)
Attempted to reach pt. Left voicemail to contact us back.

## 2022-09-01 ENCOUNTER — Encounter: Payer: Self-pay | Admitting: Family Medicine

## 2022-09-07 ENCOUNTER — Encounter: Payer: Self-pay | Admitting: Family Medicine

## 2022-09-07 NOTE — Progress Notes (Signed)
Established Patient Office Visit   Subjective  Patient ID: Beth Macias, female    DOB: [birth year]  Age: 72 y.o. MRN: [id number]  Chief Complaint  Patient presents with   Annual Exam    Pt is a 72 yo female with pmh sig for HTN, asthma, HLD, DM 2, obesity, diastolic CHF who is seen for CPE and f/u on ongoing concerns.  Pt states she had an eye exam las wk.  Mammogram scheduled 08/15/22. Pt endorses dysphagia with some foods.  Had a shingrix vaccine and 3 COVID vaccines.  Has a trip to [place] coming up with her daughter.      Patient Active Problem List   Diagnosis Date Noted   Acute on chronic heart failure with preserved ejection fraction (HFpEF) 10/27/2020   Hypertensive urgency 10/27/2020   Diabetes mellitus type 2 in obese 10/27/2020   CHF (congestive heart failure) 12/10/2018   History of recurrent UTIs 06/30/2018   Mild intermittent asthma without complication 06/30/2018   Prediabetes 06/30/2018   Environmental and seasonal allergies 06/30/2018   Diabetes 04/04/2018   Upper respiratory infection, viral 04/04/2018   Urinary tract infection 04/04/2018   Vitamin D deficiency 08/07/2016   Morbid obesity with BMI of 40.0-44.9, adult 02/18/2016   Extrinsic asthma without complication 06/19/2015   SOB (shortness of breath) 06/19/2015   Cubital tunnel syndrome, right 07/06/2014   Hypokalemia 08/16/2013   Diabetes mellitus type 2, uncomplicated 08/11/2013   GERD (gastroesophageal reflux disease) 08/28/2011   HTN (hypertension) 08/28/2011   Social History   Tobacco Use   Smoking status: Former    Packs/day: 0.25    Years: 9.00    Additional pack years: 0.00    Total pack years: 2.25    Types: Cigarettes    Quit date: 05/26/1976    Years since quitting: 46.3   Smokeless tobacco: Never  Vaping Use   Vaping Use: Never used  Substance Use Topics   Alcohol use: Never   Drug use: Never   Family History  Problem Relation Age of Onset   Heart failure Mother    Gout  Mother    Hypertension Mother    Diabetes Mother    Heart failure Father    Hypertension Father    AAA (abdominal aortic aneurysm) Father    Gout Brother    Hypertension Brother    Heart attack Brother    Colon cancer Maternal Uncle    Crohn's disease Paternal Aunt    Breast cancer Neg Hx    Esophageal cancer Neg Hx    Pancreatic cancer Neg Hx    Stomach cancer Neg Hx    Allergies  Allergen Reactions   Strawberry (Diagnostic) Anaphylaxis and Other (See Comments)    hives   Farxiga [Dapagliflozin] Other (See Comments)    Had HAs, weakness, rhinorrhea, jitteriness, nasal congestion, nocturia, yeast infections, memory changes, irritability.   Penicillins Itching    Has patient had a PCN reaction causing immediate rash, facial/tongue/throat swelling, SOB or lightheadedness with hypotension: No Has patient had a PCN reaction causing severe rash involving mucus membranes or skin necrosis: No Has patient had a PCN reaction that required hospitalization: Unknown Has patient had a PCN reaction occurring within the last 10 years: No If all of the above answers are "NO", then may proceed with Cephalosporin use.   Gabapentin Itching, Rash and Other (See Comments)    Incoherent   Tape Other (See Comments)    Blisters      ROS  Negative unless stated above    Objective:     BP (!) 150/80 (BP Location: Left Arm, Cuff Size: Large)   Pulse 69   Temp 98.3 F (36.8 C) (Oral)   Ht 5' 0.5" (1.537 m)   Wt 227 lb 6.4 oz (103.1 kg)   SpO2 98%   BMI 43.68 kg/m  BP Readings from Last 3 Encounters:  08/01/22 (!) 150/80  07/04/22 128/78  02/07/22 (!) 148/100   Wt Readings from Last 3 Encounters:  08/01/22 227 lb 6.4 oz (103.1 kg)  07/04/22 225 lb (102.1 kg)  03/27/22 218 lb (98.9 kg)      Physical Exam Constitutional:      Appearance: Normal appearance.  HENT:     Head: Normocephalic and atraumatic.     Right Ear: Tympanic membrane, ear canal and external ear normal.     Left  Ear: Tympanic membrane, ear canal and external ear normal.     Nose: Nose normal.     Mouth/Throat:     Mouth: Mucous membranes are moist.     Pharynx: No oropharyngeal exudate or posterior oropharyngeal erythema.  Eyes:     General: No scleral icterus.    Extraocular Movements: Extraocular movements intact.     Conjunctiva/sclera: Conjunctivae normal.     Pupils: Pupils are equal, round, and reactive to light.  Neck:     Thyroid: No thyromegaly.     Comments: Mobile round nodule in R lobe. Cardiovascular:     Rate and Rhythm: Normal rate and regular rhythm.     Pulses: Normal pulses.     Heart sounds: Normal heart sounds. No murmur heard.    No friction rub.  Pulmonary:     Effort: Pulmonary effort is normal.     Breath sounds: Normal breath sounds. No wheezing, rhonchi or rales.  Abdominal:     General: Bowel sounds are normal.     Palpations: Abdomen is soft.     Tenderness: There is no abdominal tenderness.  Musculoskeletal:        General: No deformity. Normal range of motion.  Lymphadenopathy:     Cervical: No cervical adenopathy.  Skin:    General: Skin is warm and dry.     Findings: No lesion.  Neurological:     General: No focal deficit present.     Mental Status: She is alert and oriented to person, place, and time.  Psychiatric:        Mood and Affect: Mood normal.        Thought Content: Thought content normal.      Results for orders placed or performed in visit on 08/01/22  CBC with Differential/Platelet  Result Value Ref Range   WBC 6.3 4.0 - 10.5 K/uL   RBC 4.81 3.87 - 5.11 Mil/uL   Hemoglobin 13.8 12.0 - 15.0 g/dL   HCT 03.4 74.2 - 59.5 %   MCV 86.7 78.0 - 100.0 fl   MCHC 33.0 30.0 - 36.0 g/dL   RDW 63.8 75.6 - 43.3 %   Platelets 257.0 150.0 - 400.0 K/uL   Neutrophils Relative % 68.0 43.0 - 77.0 %   Lymphocytes Relative 22.7 12.0 - 46.0 %   Monocytes Relative 6.7 3.0 - 12.0 %   Eosinophils Relative 1.8 0.0 - 5.0 %   Basophils Relative 0.8 0.0  - 3.0 %   Neutro Abs 4.3 1.4 - 7.7 K/uL   Lymphs Abs 1.4 0.7 - 4.0 K/uL   Monocytes Absolute 0.4 0.1 -  1.0 K/uL   Eosinophils Absolute 0.1 0.0 - 0.7 K/uL   Basophils Absolute 0.1 0.0 - 0.1 K/uL  Comprehensive metabolic panel  Result Value Ref Range   Sodium 141 135 - 145 mEq/L   Potassium 3.4 (L) 3.5 - 5.1 mEq/L   Chloride 103 96 - 112 mEq/L   CO2 31 19 - 32 mEq/L   Glucose, Bld 103 (H) 70 - 99 mg/dL   BUN 20 6 - 23 mg/dL   Creatinine, Ser 6.04 0.40 - 1.20 mg/dL   Total Bilirubin 0.8 0.2 - 1.2 mg/dL   Alkaline Phosphatase 55 39 - 117 U/L   AST 15 0 - 37 U/L   ALT 12 0 - 35 U/L   Total Protein 6.8 6.0 - 8.3 g/dL   Albumin 3.9 3.5 - 5.2 g/dL   GFR 54.09 >81.19 mL/min   Calcium 10.3 8.4 - 10.5 mg/dL  Lipid panel  Result Value Ref Range   Cholesterol 223 (H) 0 - 200 mg/dL   Triglycerides 147.8 0.0 - 149.0 mg/dL   HDL 29.56 >21.30 mg/dL   VLDL 86.5 0.0 - 78.4 mg/dL   LDL Cholesterol 696 (H) 0 - 99 mg/dL   Total CHOL/HDL Ratio 4    NonHDL 160.20   TSH  Result Value Ref Range   TSH 0.71 0.35 - 5.50 uIU/mL  Hemoglobin A1c  Result Value Ref Range   Hgb A1c MFr Bld 6.8 (H) 4.6 - 6.5 %  T4, Free  Result Value Ref Range   Free T4 0.77 0.60 - 1.60 ng/dL  Microalbumin/Creatinine Ratio, Urine  Result Value Ref Range   Microalb, Ur 12.4 (H) 0.0 - 1.9 mg/dL   Creatinine,U 295.2 mg/dL   Microalb Creat Ratio 6.5 0.0 - 30.0 mg/g      Assessment & Plan:  Well adult exam -Anticipatory guidance given including wearing seatbelts, smoke detectors in the home, increasing physical activity, increasing p.o. intake of water and vegetables. -labs -mammogram scheduled -colonoscopy done 04/29/18 -immunizations reviewed -next CPE in 1 yr.       -     Lipid panel -     TSH -     T4, free  Essential hypertension -elevated -pt encouraged to take medication daily -monitor bp at home.  Notify clinic for elevation consistently greater than 140/90. -continue f/u with Cards -     CBC with  Differential/Platelet -     Comprehensive metabolic panel  Type 2 diabetes mellitus with other specified complication, without long-term current use of insulin (HCC) -hgb A1 C 6.3% on 02/07/22 -diet controlled -     Hemoglobin A1c -     Microalbumin / creatinine urine ratio  Dysphagia, unspecified type -     Ambulatory referral to Gastroenterology  Thyroid nodule -noted on exam. Obtain labs.  Schedule u/s at pt's convenience.  Morbid obesity (HCC) -Body mass index is 43.68 kg/m.  Mixed hyperlipidemia -     Lipid panel   Return if symptoms worsen or fail to improve, for chronic conditions.   Deeann Saint, MD

## 2022-09-09 ENCOUNTER — Other Ambulatory Visit: Payer: Self-pay | Admitting: Family Medicine

## 2022-09-09 DIAGNOSIS — I10 Essential (primary) hypertension: Secondary | ICD-10-CM

## 2022-09-22 ENCOUNTER — Telehealth: Payer: Self-pay | Admitting: Family Medicine

## 2022-09-22 ENCOUNTER — Ambulatory Visit (INDEPENDENT_AMBULATORY_CARE_PROVIDER_SITE_OTHER): Payer: Medicare PPO

## 2022-09-22 VITALS — Ht 61.0 in | Wt 231.0 lb

## 2022-09-22 DIAGNOSIS — Z0000 Encounter for general adult medical examination without abnormal findings: Secondary | ICD-10-CM | POA: Diagnosis not present

## 2022-09-22 DIAGNOSIS — J3089 Other allergic rhinitis: Secondary | ICD-10-CM

## 2022-09-22 MED ORDER — FLUTICASONE PROPIONATE 50 MCG/ACT NA SUSP: 1.0000 | Freq: Every day | NASAL | 6 refills | Status: DC

## 2022-09-22 NOTE — Patient Instructions (Addendum)
Beth Macias , Thank you for taking time to come for your Medicare Wellness Visit. I appreciate your ongoing commitment to your health goals. Please review the following plan we discussed and let me know if I can assist you in the future.   These are the goals we discussed:  Goals       DIET - INCREASE WATER INTAKE      Track and Manage My Blood Pressure-Hypertension      Timeframe:  Long-Range Goal Priority:  Medium Start Date:                             Expected End Date:                       Follow Up Date 11/27/21    - check blood pressure weekly - choose a place to take my blood pressure (home, clinic or office, retail store) - write blood pressure results in a log or diary    Why is this important?   You won't feel high blood pressure, but it can still hurt your blood vessels.  High blood pressure can cause heart or kidney problems. It can also cause a stroke.  Making lifestyle changes like losing a little weight or eating less salt will help.  Checking your blood pressure at home and at different times of the day can help to control blood pressure.  If the doctor prescribes medicine remember to take it the way the doctor ordered.  Call the office if you cannot afford the medicine or if there are questions about it.     Notes:       Weight (lb) < 200 lb (90.7 kg) (pt-stated)      I would like to lose weight and increase water activities.        This is a list of the screening recommended for you and due dates:  Health Maintenance  Topic Date Due   Eye exam for diabetics  09/22/2022*   COVID-19 Vaccine (4 - 2023-24 season) 10/08/2022*   Zoster (Shingles) Vaccine (1 of 2) 12/22/2022*   Flu Shot  12/25/2022   Hemoglobin A1C  02/01/2023   Complete foot exam   07/20/2023   Yearly kidney function blood test for diabetes  08/01/2023   Yearly kidney health urinalysis for diabetes  08/01/2023   Mammogram  08/15/2023   Medicare Annual Wellness Visit  09/22/2023    DTaP/Tdap/Td vaccine (2 - Td or Tdap) 05/12/2024   Colon Cancer Screening  04/29/2028   Pneumonia Vaccine  Completed   DEXA scan (bone density measurement)  Completed   Hepatitis C Screening: USPSTF Recommendation to screen - Ages 52-79 yo.  Completed   HPV Vaccine  Aged Out  *Topic was postponed. The date shown is not the original due date.    Advanced directives: In Chart  Conditions/risks identified: None  Next appointment: Follow up in one year for your annual wellness visit    Preventive Care 65 Years and Older, Female Preventive care refers to lifestyle choices and visits with your health care provider that can promote health and wellness. What does preventive care include? A yearly physical exam. This is also called an annual well check. Dental exams once or twice a year. Routine eye exams. Ask your health care provider how often you should have your eyes checked. Personal lifestyle choices, including: Daily care of your teeth and gums. Regular physical  activity. Eating a healthy diet. Avoiding tobacco and drug use. Limiting alcohol use. Practicing safe sex. Taking low-dose aspirin every day. Taking vitamin and mineral supplements as recommended by your health care provider. What happens during an annual well check? The services and screenings done by your health care provider during your annual well check will depend on your age, overall health, lifestyle risk factors, and family history of disease. Counseling  Your health care provider may ask you questions about your: Alcohol use. Tobacco use. Drug use. Emotional well-being. Home and relationship well-being. Sexual activity. Eating habits. History of falls. Memory and ability to understand (cognition). Work and work Astronomer. Reproductive health. Screening  You may have the following tests or measurements: Height, weight, and BMI. Blood pressure. Lipid and cholesterol levels. These may be checked every 5  years, or more frequently if you are over 71 years old. Skin check. Lung cancer screening. You may have this screening every year starting at age 70 if you have a 30-pack-year history of smoking and currently smoke or have quit within the past 15 years. Fecal occult blood test (FOBT) of the stool. You may have this test every year starting at age 40. Flexible sigmoidoscopy or colonoscopy. You may have a sigmoidoscopy every 5 years or a colonoscopy every 10 years starting at age 50. Hepatitis C blood test. Hepatitis B blood test. Sexually transmitted disease (STD) testing. Diabetes screening. This is done by checking your blood sugar (glucose) after you have not eaten for a while (fasting). You may have this done every 1-3 years. Bone density scan. This is done to screen for osteoporosis. You may have this done starting at age 3. Mammogram. This may be done every 1-2 years. Talk to your health care provider about how often you should have regular mammograms. Talk with your health care provider about your test results, treatment options, and if necessary, the need for more tests. Vaccines  Your health care provider may recommend certain vaccines, such as: Influenza vaccine. This is recommended every year. Tetanus, diphtheria, and acellular pertussis (Tdap, Td) vaccine. You may need a Td booster every 10 years. Zoster vaccine. You may need this after age 48. Pneumococcal 13-valent conjugate (PCV13) vaccine. One dose is recommended after age 38. Pneumococcal polysaccharide (PPSV23) vaccine. One dose is recommended after age 49. Talk to your health care provider about which screenings and vaccines you need and how often you need them. This information is not intended to replace advice given to you by your health care provider. Make sure you discuss any questions you have with your health care provider. Document Released: 06/08/2015 Document Revised: 01/30/2016 Document Reviewed: 03/13/2015 Elsevier  Interactive Patient Education  2017 [employer].  Fall Prevention in the Home Falls can cause injuries. They can happen to people of all ages. There are many things you can do to make your home safe and to help prevent falls. What can I do on the outside of my home? Regularly fix the edges of walkways and driveways and fix any cracks. Remove anything that might make you trip as you walk through a door, such as a raised step or threshold. Trim any bushes or trees on the path to your home. Use bright outdoor lighting. Clear any walking paths of anything that might make someone trip, such as rocks or tools. Regularly check to see if handrails are loose or broken. Make sure that both sides of any steps have handrails. Any raised decks and porches should have guardrails on  the edges. Have any leaves, snow, or ice cleared regularly. Use sand or salt on walking paths during winter. Clean up any spills in your garage right away. This includes oil or grease spills. What can I do in the bathroom? Use night lights. Install grab bars by the toilet and in the tub and shower. Do not use towel bars as grab bars. Use non-skid mats or decals in the tub or shower. If you need to sit down in the shower, use a plastic, non-slip stool. Keep the floor dry. Clean up any water that spills on the floor as soon as it happens. Remove soap buildup in the tub or shower regularly. Attach bath mats securely with double-sided non-slip rug tape. Do not have throw rugs and other things on the floor that can make you trip. What can I do in the bedroom? Use night lights. Make sure that you have a light by your bed that is easy to reach. Do not use any sheets or blankets that are too big for your bed. They should not hang down onto the floor. Have a firm chair that has side arms. You can use this for support while you get dressed. Do not have throw rugs and other things on the floor that can make you trip. What can I do  in the kitchen? Clean up any spills right away. Avoid walking on wet floors. Keep items that you use a lot in easy-to-reach places. If you need to reach something above you, use a strong step stool that has a grab bar. Keep electrical cords out of the way. Do not use floor polish or wax that makes floors slippery. If you must use wax, use non-skid floor wax. Do not have throw rugs and other things on the floor that can make you trip. What can I do with my stairs? Do not leave any items on the stairs. Make sure that there are handrails on both sides of the stairs and use them. Fix handrails that are broken or loose. Make sure that handrails are as long as the stairways. Check any carpeting to make sure that it is firmly attached to the stairs. Fix any carpet that is loose or worn. Avoid having throw rugs at the top or bottom of the stairs. If you do have throw rugs, attach them to the floor with carpet tape. Make sure that you have a light switch at the top of the stairs and the bottom of the stairs. If you do not have them, ask someone to add them for you. What else can I do to help prevent falls? Wear shoes that: Do not have high heels. Have rubber bottoms. Are comfortable and fit you well. Are closed at the toe. Do not wear sandals. If you use a stepladder: Make sure that it is fully opened. Do not climb a closed stepladder. Make sure that both sides of the stepladder are locked into place. Ask someone to hold it for you, if possible. Clearly mark and make sure that you can see: Any grab bars or handrails. First and last steps. Where the edge of each step is. Use tools that help you move around (mobility aids) if they are needed. These include: Canes. Walkers. Scooters. Crutches. Turn on the lights when you go into a dark area. Replace any light bulbs as soon as they burn out. Set up your furniture so you have a clear path. Avoid moving your furniture around. If any of your  floors are  uneven, fix them. If there are any pets around you, be aware of where they are. Review your medicines with your doctor. Some medicines can make you feel dizzy. This can increase your chance of falling. Ask your doctor what other things that you can do to help prevent falls. This information is not intended to replace advice given to you by your health care provider. Make sure you discuss any questions you have with your health care provider. Document Released: 03/08/2009 Document Revised: 10/18/2015 Document Reviewed: 06/16/2014 Elsevier Interactive Patient Education  2017 [employer].

## 2022-09-22 NOTE — Progress Notes (Signed)
Subjective:   Beth Macias is a 72 y.o. female who presents for Medicare Annual (Subsequent) preventive examination.  Review of Systems    Virtual Visit via Telephone Note  I connected with  Beth Macias on 09/22/22 at  3:15 PM EDT by telephone and verified that I am speaking with the correct person using two identifiers.  Location: Patient: Home Provider: Office Persons participating in the virtual visit: patient/Nurse Health Advisor   I discussed the limitations, risks, security and privacy concerns of performing an evaluation and management service by telephone and the availability of in person appointments. The patient expressed understanding and agreed to proceed.  Interactive audio and video telecommunications were attempted between this nurse and patient, however failed, due to patient having technical difficulties OR patient did not have access to video capability.  We continued and completed visit with audio only.  Some vital signs may be absent or patient reported.   Beth Rung, LPN  Cardiac Risk Factors include: advanced age (>49men, >42 women);diabetes mellitus;hypertension     Objective:    Today's Vitals   09/22/22 1510  Weight: 231 lb (104.8 kg)  Height: 5\' 1"  (1.549 m)   Body mass index is 43.65 kg/m.     09/22/2022    3:25 PM 09/26/2021    3:39 PM 09/18/2021    3:17 PM 10/28/2020   12:00 AM 09/17/2020    3:04 PM 10/25/2019    7:38 AM 10/21/2019    9:46 AM  Advanced Directives  Does Patient Have a Medical Advance Directive? Yes Yes Yes Yes Yes Yes Yes  Type of Estate agent of [place];Living will  Healthcare Power of [place];Living will Healthcare Power of [employer] of [place];Living will    Does patient want to make changes to medical advance directive? No - Patient declined No - Patient declined No - Patient declined No - Patient declined No - Patient declined Yes (Inpatient - patient defers changing a  medical advance directive and declines information at this time)   Copy of Healthcare Power of Attorney in Chart? Yes - validated most recent copy scanned in chart (See row information)  No - copy requested No - copy requested No - copy requested      Current Medications (verified) Outpatient Encounter Medications as of 09/22/2022  Medication Sig   albuterol (VENTOLIN HFA) 108 (90 Base) MCG/ACT inhaler INHALE 2 PUFFS INTO THE LUNGS EVERY 6 HOURS AS NEEDED FOR WHEEZE OR SHORTNESS OF BREATH   allopurinol (ZYLOPRIM) 100 MG tablet Take 1 tablet (100 mg total) by mouth daily.   blood glucose meter kit and supplies KIT Dispense based on patient and insurance preference. Use up to four times daily as directed.   Blood Pressure Monitoring (BLOOD PRESSURE CUFF) MISC For daily monitoring as directed. (Patient not taking: Reported on 08/01/2022)   Calcium-Magnesium-Vitamin D (CALCIUM 1200+D3 PO) Take 600 mg by mouth daily.   cholecalciferol (VITAMIN D) 25 MCG (1000 UNIT) tablet Take 1,000 Units by mouth at bedtime.    Cyanocobalamin (CVS B12 GUMMIES PO) Take 1,000 mcg by mouth daily.   famotidine (PEPCID) 40 MG tablet Take 1 tablet (40 mg total) by mouth in the morning.   fluticasone (FLONASE) 50 MCG/ACT nasal spray Place 1 spray into both nostrils daily.   ipratropium-albuterol (DUONEB) 0.5-2.5 (3) MG/3ML SOLN Take 3 mLs by nebulization every 6 (six) hours as needed (shortness of breath/wheezing).   levocetirizine (XYZAL) 5 MG tablet Take 5 mg by mouth  daily.   magnesium oxide (MAG-OX) 400 MG tablet Take 400 mg by mouth as needed.   mometasone-formoterol (DULERA) 100-5 MCG/ACT AERO Inhale 2 puffs into the lungs 2 (two) times daily. (Patient not taking: Reported on 08/01/2022)   montelukast (SINGULAIR) 10 MG tablet Take 1 tablet (10 mg total) by mouth daily.   nebivolol (BYSTOLIC) 10 MG tablet TAKE 1 [id number](14 MG) BY MOUTH DAILY   ONETOUCH ULTRA test strip USE TO TEST BLOOD SUGAR UP TO FOUR TIMES DAILY    potassium chloride 20 MEQ/15ML (10%) SOLN Take 15 mLs (20 mEq total) by mouth daily as needed (with each dose of torsemide).   sacubitril-valsartan (ENTRESTO) 49-51 MG Take 1 tablet by mouth 2 (two) times daily.   torsemide (DEMADEX) 20 MG tablet Take 1 tablet (20 mg total) by mouth daily as needed (swelling/fluid/weight gain).   vitamin C (ASCORBIC ACID) 500 MG tablet Take 500 mg by mouth daily.   [DISCONTINUED] fluticasone (FLONASE) 50 MCG/ACT nasal spray Place 1 spray into both nostrils daily.   No facility-administered encounter medications on file as of 09/22/2022.    Allergies (verified) Strawberry (diagnostic), Farxiga [dapagliflozin], Penicillins, Gabapentin, and Tape   History: Past Medical History:  Diagnosis Date   Asthma    Blood in stool    CHF (congestive heart failure) (HCC)    Colon polyps    Diabetes mellitus without complication (HCC)    Diverticulitis    Fibromyalgia    GERD (gastroesophageal reflux disease)    Gout    Hypercholesteremia    Hypertension    Pneumonia    Pneumonia    UTI (urinary tract infection)    Past Surgical History:  Procedure Laterality Date   APPENDECTOMY     CESAREAN SECTION     x 3   LIPOMA EXCISION     MENISCUS REPAIR Left    OVARIAN CYST REMOVAL     OVARIAN CYST SURGERY     Family History  Problem Relation Age of Onset   Heart failure Mother    Gout Mother    Hypertension Mother    Diabetes Mother    Heart failure Father    Hypertension Father    AAA (abdominal aortic aneurysm) Father    Gout Brother    Hypertension Brother    Heart attack Brother    Colon cancer Maternal Uncle    Crohn's disease Paternal Aunt    Breast cancer Neg Hx    Esophageal cancer Neg Hx    Pancreatic cancer Neg Hx    Stomach cancer Neg Hx    Social History   Socioeconomic History   Marital status: Legally Separated    Spouse name: Not on file   Number of children: 3   Years of education: Not on file   Highest education level: Not on  file  Occupational History   Occupation: Runner, broadcasting/film/video  Tobacco Use   Smoking status: Former    Packs/day: 0.25    Years: 9.00    Additional pack years: 0.00    Total pack years: 2.25    Types: Cigarettes    Quit date: 05/26/1976    Years since quitting: 46.3   Smokeless tobacco: Never  Vaping Use   Vaping Use: Never used  Substance and Sexual Activity   Alcohol use: Never   Drug use: Never   Sexual activity: Yes  Other Topics Concern   Not on file  Social History Narrative   Not on file   Social Determinants of Health  Financial Resource Strain: Low Risk  (09/22/2022)   Overall Financial Resource Strain (CARDIA)    Difficulty of Paying Living Expenses: Not hard at all  Food Insecurity: No Food Insecurity (09/22/2022)   Hunger Vital Sign    Worried About Running Out of Food in the Last Year: Never true    Ran Out of Food in the Last Year: Never true  Transportation Needs: No Transportation Needs (09/22/2022)   PRAPARE - Administrator, Civil Service (Medical): No    Lack of Transportation (Non-Medical): No  Physical Activity: Sufficiently Active (09/22/2022)   Exercise Vital Sign    Days of Exercise per Week: 3 days    Minutes of Exercise per Session: 60 min  Stress: No Stress Concern Present (09/22/2022)   [employer] of Occupational Health - Occupational Stress Questionnaire    Feeling of Stress : Not at all  Social Connections: Moderately Integrated (09/22/2022)   Social Connection and Isolation Panel [NHANES]    Frequency of Communication with Friends and Family: More than three times a week    Frequency of Social Gatherings with Friends and Family: More than three times a week    Attends Religious Services: More than 4 times per year    Active Member of [employer] or Organizations: Yes    Attends Engineer, structural: More than 4 times per year    Marital Status: Separated    Tobacco Counseling Counseling given: Not Answered   Clinical  Intake:  Pre-visit preparation completed: No  Pain : No/denies pain    Nutrition Risk Assessment:  Has the patient had any N/V/D within the last 2 months?  No  Does the patient have any non-healing wounds?  No  Has the patient had any unintentional weight loss or weight gain?  No   Diabetes:  Is the patient diabetic?  Yes  If diabetic, was a CBG obtained today?  No  Did the patient bring in their glucometer from home?  No  How often do you monitor your CBG's? Daily.   Financial Strains and Diabetes Management:  Are you having any financial strains with the device, your supplies or your medication? No .  Does the patient want to be seen by Chronic Care Management for management of their diabetes?  No  Would the patient like to be referred to a Nutritionist or for Diabetic Management?  No   Diabetic Exams:  Diabetic Eye Exam: Completed . Overdue for diabetic eye exam. Pt has been advised about the importance in completing this exam. A referral has been placed today. Message sent to referral coordinator for scheduling purposes. Advised pt to expect a call from office referred to regarding appt.  Diabetic Foot Exam: Completed . Pt has been advised about the importance in completing this exam. Pt is scheduled for diabetic foot exam on Followed by PCP.   BMI - recorded: 43.65 Nutritional Status: BMI > 30  Obese Nutritional Risks: None Diabetes: Yes CBG done?: No Did pt. bring in CBG monitor from home?: No  How often do you need to have someone help you when you read instructions, pamphlets, or other written materials from your doctor or pharmacy?: 1 - Never  Diabetic?  Yes  Interpreter Needed?: No  Information entered by :: Theresa Mulligan LPN   Activities of Daily Living    09/22/2022    3:23 PM  In your present state of health, do you have any difficulty performing the following activities:  Hearing? 0  Vision? 0  Difficulty concentrating or making decisions? 0   Walking or climbing stairs? 0  Dressing or bathing? 0  Doing errands, shopping? 0  Preparing Food and eating ? N  Using the Toilet? N  In the past six months, have you accidently leaked urine? N  Do you have problems with loss of bowel control? N  Managing your Medications? N  Managing your Finances? N  Housekeeping or managing your Housekeeping? N    Patient Care Team: Deeann Saint, MD as PCP - General (Family Medicine) Wendall Stade, MD as PCP - Cardiology (Cardiology) Verner Chol, [facility] (Inactive) as Pharmacist (Pharmacist) Elinor Parkinson, DPM as Consulting Physician (Podiatry)  Indicate any recent Medical Services you may have received from other than [facility] providers in the past year (date may be approximate).     Assessment:   This is a routine wellness examination for Kaydin.  Hearing/Vision screen Hearing Screening - Comments:: Denies hearing difficulties   Vision Screening - Comments:: Wears rx glasses - up to date with [facility] exams with  [facility]  Dietary issues and exercise activities discussed: Exercise limited by: None identified   Goals Addressed               This Visit's Progress     Weight (lb) < 200 lb (90.7 kg) (pt-stated)   231 lb (104.8 kg)     I would like to lose weight and increase water activities.       Depression Screen    09/22/2022    3:19 PM 09/26/2021    3:39 PM 09/18/2021    3:11 PM 07/12/2021    8:28 AM 09/17/2020    3:07 PM 09/17/2020    2:58 PM 06/29/2019    1:52 PM  PHQ 2/9 Scores  PHQ - 2 Score 0 0 0 0 0 0 0  PHQ- 9 Score    3       Fall Risk    09/22/2022    3:24 PM 09/26/2021    3:39 PM 09/18/2021    3:15 PM 07/12/2021    8:27 AM 09/17/2020    3:06 PM  Fall Risk   Falls in the past year? 0 1 1  0  Number falls in past yr: 0  0 0 0  Injury with Fall? 0  1 0 0  Comment   Knee injury. Followed by [facility]    Risk for fall due to : No Fall Risks  No Fall Risks No Fall Risks   Follow up Falls  prevention discussed   Falls evaluation completed Falls evaluation completed    FALL RISK PREVENTION PERTAINING TO THE HOME:  Any stairs in or around the home? Yes  If so, are there any without handrails? No  Home free of loose throw rugs in walkways, pet beds, electrical cords, etc? Yes  Adequate lighting in your home to reduce risk of falls? Yes   ASSISTIVE DEVICES UTILIZED TO PREVENT FALLS:  Life alert? No  Use of a cane, walker or w/c? No  Grab bars in the bathroom? Yes  Shower chair or bench in shower? No  Elevated toilet seat or a handicapped toilet? No   TIMED UP AND GO:  Was the test performed? No . Audio Visit   Cognitive Function:        09/22/2022    3:25 PM 09/18/2021    3:18 PM  6CIT Screen  What Year? 0 points 0  points  What month? 0 points 0 points  What time? 0 points 0 points  Count back from 20 0 points 0 points  Months in reverse 0 points 0 points  Repeat phrase 0 points 0 points  Total Score 0 points 0 points    Immunizations Immunization History  Administered Date(s) Administered   Fluad Quad(high Dose 65+) 03/07/2019, 07/12/2021   Influenza Whole 02/23/2018   Influenza, High Dose Seasonal PF 04/23/2015, 03/04/2016   Influenza, Seasonal, Injecte, Preservative Fre 03/28/2013, 05/12/2014   PFIZER(Purple Top)SARS-COV-2 Vaccination 07/21/2019, 08/16/2019, 02/29/2020   PPD Test 02/18/2016   Pneumococcal Conjugate-13 04/23/2015   Pneumococcal Polysaccharide-23 08/11/2013, 06/29/2019   Tdap 05/12/2014    TDAP status: Up to date  Flu Vaccine status: Up to date  Pneumococcal vaccine status: Up to date  Covid-19 vaccine status: Completed vaccines  Qualifies for Shingles Vaccine? Yes   Zostavax completed No   Shingrix Completed?: No.    Education has been provided regarding the importance of this vaccine. Patient has been advised to call insurance company to determine out of pocket expense if they have not yet received this vaccine. Advised may  also receive vaccine at local pharmacy or Health Dept. Verbalized acceptance and understanding.  Screening Tests Health Maintenance  Topic Date Due   OPHTHALMOLOGY EXAM  09/22/2022 (Originally 11/22/2020)   COVID-19 Vaccine (4 - 2023-24 season) 10/08/2022 (Originally 01/24/2022)   Zoster Vaccines- Shingrix (1 of 2) 12/22/2022 (Originally 11/14/1969)   INFLUENZA VACCINE  12/25/2022   HEMOGLOBIN A1C  02/01/2023   FOOT EXAM  07/20/2023   Diabetic kidney evaluation - eGFR measurement  08/01/2023   Diabetic kidney evaluation - Urine ACR  08/01/2023   MAMMOGRAM  08/15/2023   Medicare Annual Wellness (AWV)  09/22/2023   DTaP/Tdap/Td (2 - Td or Tdap) 05/12/2024   COLONOSCOPY (Pts 45-65yrs Insurance coverage will need to be confirmed)  04/29/2028   Pneumonia Vaccine 54+ Years old  Completed   DEXA SCAN  Completed   Hepatitis C Screening  Completed   HPV VACCINES  Aged Out    Health Maintenance  There are no preventive care reminders to display for this patient.   Colorectal cancer screening: Type of screening: Colonoscopy. Completed 04/29/18. Repeat every 10 years  Mammogram status: Completed 08/15/22. Repeat every year  Bone Density status: Completed 09/26/21. Results reflect: Bone density results: OSTEOPOROSIS. Repeat every   years.  Lung Cancer Screening: (Low Dose CT Chest recommended if Age 31-80 years, 30 pack-year currently smoking OR have quit w/in 15years.) does not qualify.    Additional Screening:  Hepatitis C Screening: does qualify; Completed 07/12/21  Vision Screening: Recommended annual ophthalmology exams for early detection of glaucoma and other disorders of the eye. Is the patient up to date with their annual eye exam?  Yes  Who is the provider or what is the name of the office in which the patient attends annual eye exams? [facility] If pt is not established with a provider, would they like to be referred to a provider to establish care? No .   Dental Screening:  Recommended annual dental exams for proper oral hygiene  Community Resource Referral / Chronic Care Management:  CRR required this visit?  No   CCM required this visit?  No      Plan:     I have personally reviewed and noted the following in the patient's chart:   Medical and social history Use of alcohol, tobacco or illicit drugs  Current medications and supplements  including opioid prescriptions. Patient is not currently taking opioid prescriptions. Functional ability and status Nutritional status Physical activity Advanced directives List of other physicians Hospitalizations, surgeries, and ER visits in previous 12 months Vitals Screenings to include cognitive, depression, and falls Referrals and appointments  In addition, I have reviewed and discussed with patient certain preventive protocols, quality metrics, and best practice recommendations. A written personalized care plan for preventive services as well as general preventive health recommendations were provided to patient.     Beth Rung, LPN   1/61/0960   Nurse Notes: None

## 2022-09-22 NOTE — Telephone Encounter (Signed)
Refill request fluticasone furoate-vilanterol (BREO ELLIPTA) 200-25 MCG/INH 1 puff    nebivolol (BYSTOLIC) 10 MG tablet Blood Pressure Monitoring (BLOOD PRESSURE CUFF) MISC

## 2022-09-22 NOTE — Telephone Encounter (Signed)
Refill sent in for the fluticasone [facility]), as that is what is listed in current medicxations. Nebivolol (BYSTOLIC) 10 MG was refilled on 4/25.

## 2022-09-23 ENCOUNTER — Ambulatory Visit (INDEPENDENT_AMBULATORY_CARE_PROVIDER_SITE_OTHER): Payer: Medicare PPO | Admitting: Internal Medicine

## 2022-09-23 VITALS — BP 184/89 | HR 72 | Temp 98.8°F | Wt 227.1 lb

## 2022-09-23 DIAGNOSIS — R059 Cough, unspecified: Secondary | ICD-10-CM | POA: Diagnosis not present

## 2022-09-23 DIAGNOSIS — J069 Acute upper respiratory infection, unspecified: Secondary | ICD-10-CM

## 2022-09-23 DIAGNOSIS — J3089 Other allergic rhinitis: Secondary | ICD-10-CM

## 2022-09-23 DIAGNOSIS — R0981 Nasal congestion: Secondary | ICD-10-CM

## 2022-09-23 LAB — POC COVID19 BINAXNOW: SARS Coronavirus 2 Ag: NEGATIVE

## 2022-09-23 LAB — POCT INFLUENZA A/B
Influenza A, POC: NEGATIVE
Influenza B, POC: NEGATIVE

## 2022-09-23 MED ORDER — ALBUTEROL SULFATE HFA 108 (90 BASE) MCG/ACT IN AERS
2.0000 | INHALATION_SPRAY | RESPIRATORY_TRACT | 5 refills | Status: DC | PRN
Start: 2022-09-23 — End: 2023-04-17

## 2022-09-23 MED ORDER — LEVOCETIRIZINE DIHYDROCHLORIDE 5 MG PO TABS
5.0000 mg | ORAL_TABLET | Freq: Every evening | ORAL | 1 refills | Status: AC
Start: 2022-09-23 — End: ?

## 2022-09-23 NOTE — Progress Notes (Signed)
Established Patient Office Visit     CC/Reason for Visit: URI symptoms  HPI: Beth Macias is a 72 y.o. female who is coming in today for the above mentioned reasons.  She returned from [place] 7 days ago.  Ever since then has been experiencing congestion, runny nose, postnasal drip and cough.  She has a history of asthma and severe environmental and seasonal allergies.   Past Medical/Surgical History: Past Medical History:  Diagnosis Date   Asthma    Blood in stool    CHF (congestive heart failure) (HCC)    Colon polyps    Diabetes mellitus without complication (HCC)    Diverticulitis    Fibromyalgia    GERD (gastroesophageal reflux disease)    Gout    Hypercholesteremia    Hypertension    Pneumonia    Pneumonia    UTI (urinary tract infection)     Past Surgical History:  Procedure Laterality Date   APPENDECTOMY     CESAREAN SECTION     x 3   LIPOMA EXCISION     MENISCUS REPAIR Left    OVARIAN CYST REMOVAL     OVARIAN CYST SURGERY      Social History:  reports that she quit smoking about 46 years ago. Her smoking use included cigarettes. She has a 2.25 pack-year smoking history. She has never used smokeless tobacco. She reports that she does not drink alcohol and does not use drugs.  Allergies: Allergies  Allergen Reactions   Strawberry (Diagnostic) Anaphylaxis and Other (See Comments)    hives   Farxiga [Dapagliflozin] Other (See Comments)    Had HAs, weakness, rhinorrhea, jitteriness, nasal congestion, nocturia, yeast infections, memory changes, irritability.   Penicillins Itching    Has patient had a PCN reaction causing immediate rash, facial/tongue/throat swelling, SOB or lightheadedness with hypotension: No Has patient had a PCN reaction causing severe rash involving mucus membranes or skin necrosis: No Has patient had a PCN reaction that required hospitalization: Unknown Has patient had a PCN reaction occurring within the last 10 years: No If  all of the above answers are "NO", then may proceed with Cephalosporin use.   Gabapentin Itching, Rash and Other (See Comments)    Incoherent   Tape Other (See Comments)    Blisters    Family History:  Family History  Problem Relation Age of Onset   Heart failure Mother    Gout Mother    Hypertension Mother    Diabetes Mother    Heart failure Father    Hypertension Father    AAA (abdominal aortic aneurysm) Father    Gout Brother    Hypertension Brother    Heart attack Brother    Colon cancer Maternal Uncle    Crohn's disease Paternal Aunt    Breast cancer Neg Hx    Esophageal cancer Neg Hx    Pancreatic cancer Neg Hx    Stomach cancer Neg Hx      Current Outpatient Medications:    allopurinol (ZYLOPRIM) 100 MG tablet, Take 1 tablet (100 mg total) by mouth daily., Disp: 30 tablet, Rfl: 6   blood glucose meter kit and supplies KIT, Dispense based on patient and insurance preference. Use up to four times daily as directed., Disp: 1 each, Rfl: 0   Blood Pressure Monitoring (BLOOD PRESSURE CUFF) MISC, For daily monitoring as directed., Disp: 1 each, Rfl: 0   Calcium-Magnesium-Vitamin D (CALCIUM 1200+D3 PO), Take 600 mg by mouth daily., Disp: , Rfl:  cholecalciferol (VITAMIN D) 25 MCG (1000 UNIT) tablet, Take 1,000 Units by mouth at bedtime. , Disp: , Rfl:    Cyanocobalamin (CVS B12 GUMMIES PO), Take 1,000 mcg by mouth daily., Disp: , Rfl:    famotidine (PEPCID) 40 MG tablet, Take 1 tablet (40 mg total) by mouth in the morning., Disp: 90 tablet, Rfl: 3   fluticasone (FLONASE) 50 MCG/ACT nasal spray, Place 1 spray into both nostrils daily., Disp: 16 g, Rfl: 6   ipratropium-albuterol (DUONEB) 0.5-2.5 (3) MG/3ML SOLN, Take 3 mLs by nebulization every 6 (six) hours as needed (shortness of breath/wheezing)., Disp: , Rfl:    magnesium oxide (MAG-OX) 400 MG tablet, Take 400 mg by mouth as needed., Disp: , Rfl:    mometasone-formoterol (DULERA) 100-5 MCG/ACT AERO, Inhale 2 puffs into the  lungs 2 (two) times daily., Disp: 1 each, Rfl: 10   montelukast (SINGULAIR) 10 MG tablet, Take 1 tablet (10 mg total) by mouth daily., Disp: 90 tablet, Rfl: 3   nebivolol (BYSTOLIC) 10 MG tablet, TAKE 1 TABLET(10 MG) BY MOUTH DAILY (Patient taking differently: Take 10 mg by mouth 2 (two) times daily.), Disp: 90 tablet, Rfl: 0   ONETOUCH ULTRA test strip, USE TO TEST BLOOD SUGAR UP TO FOUR TIMES DAILY, Disp: 100 strip, Rfl: 3   potassium chloride 20 MEQ/15ML (10%) SOLN, Take 15 mLs (20 mEq total) by mouth daily as needed (with each dose of torsemide)., Disp: 473 mL, Rfl: 0   sacubitril-valsartan (ENTRESTO) 49-51 MG, Take 1 tablet by mouth 2 (two) times daily., Disp: 180 tablet, Rfl: 3   torsemide (DEMADEX) 20 MG tablet, Take 1 tablet (20 mg total) by mouth daily as needed (swelling/fluid/weight gain)., Disp: 90 tablet, Rfl: 1   vitamin C (ASCORBIC ACID) 500 MG tablet, Take 500 mg by mouth daily., Disp: , Rfl:    albuterol (VENTOLIN HFA) 108 (90 Base) MCG/ACT inhaler, Inhale 2 puffs into the lungs every 4 (four) hours as needed for wheezing or shortness of breath., Disp: 8.5 each, Rfl: 5   levocetirizine (XYZAL) 5 MG tablet, Take 1 tablet (5 mg total) by mouth every evening., Disp: 90 tablet, Rfl: 1  Review of Systems:  Negative unless indicated in HPI.   Physical Exam: Vitals:   09/23/22 1023 09/23/22 1035  BP: (!) 160/90 (!) 184/89  Pulse: 72   Temp: 98.8 F (37.1 C)   TempSrc: Oral   SpO2: 96%   Weight: 227 lb 1.6 oz (103 kg)     Body mass index is 42.91 kg/m.   Physical Exam Vitals reviewed.  Constitutional:      Appearance: Normal appearance.  HENT:     Right Ear: Tympanic membrane, ear canal and external ear normal.     Left Ear: Tympanic membrane, ear canal and external ear normal.     Mouth/Throat:     Mouth: Mucous membranes are moist.     Pharynx: Oropharynx is clear.  Eyes:     Conjunctiva/sclera: Conjunctivae normal.     Pupils: Pupils are equal, round, and  reactive to light.  Cardiovascular:     Rate and Rhythm: Normal rate and regular rhythm.  Pulmonary:     Effort: Pulmonary effort is normal.     Breath sounds: Normal breath sounds.  Neurological:     Mental Status: She is alert.      Impression and Plan:  Environmental and seasonal allergies -     Albuterol Sulfate HFA; Inhale 2 puffs into the lungs every 4 (four) hours  as needed for wheezing or shortness of breath.  Dispense: 8.5 each; Refill: 5 -     Levocetirizine Dihydrochloride; Take 1 tablet (5 mg total) by mouth every evening.  Dispense: 90 tablet; Refill: 1  Cough, unspecified type -     POC COVID-19 BinaxNow -     POCT Influenza A/B  Congestion of nasal sinus -     POC COVID-19 BinaxNow -     POCT Influenza A/B  Viral upper respiratory tract infection   -Given exam findings, PNA, pharyngitis, ear infection are not likely, hence abx have not been prescribed. -Likely a URI on top of seasonal allergies. -Have advised rest, fluids, OTC antihistamines, cough suppressants and mucinex. -RTC if no improvement in 10-14 days. -In office COVID test is negative.   Time spent:31 minutes reviewing chart, interviewing and examining patient and formulating plan of care.     Chaya Jan, MD [facility] at [facility]

## 2022-09-23 NOTE — Patient Instructions (Signed)
-  Nice seeing you today!!  -Take: levocetirizine daily, mucinex twice daily and flonase twice daily.

## 2022-09-26 ENCOUNTER — Telehealth: Payer: Self-pay

## 2022-09-26 NOTE — Progress Notes (Unsigned)
Care Management & Coordination Services Pharmacy Note  09/29/2022 Name:  Beth Macias MRN:  [id number] DOB:  June 09, 1950  Summary: BP not at goal <140/90, unable to check at home with cuff large enough to fit her arm LDL extremely elevated above goal <70  Recommendations/Changes made from today's visit: -Re-print rx order for BP cuff so patient can obtain from [facility] store of her choosing -Counseled to check BP daily once she obtains and keep a log -Counseled on cholesterol-lowering diet and exercise. -Recommend starting a statin, patient declines  Follow up plan: BP call in 1 month and 4 months Pharmacist visit in 6 months   Subjective: Beth Macias is an 72 y.o. year old female who is a primary patient of Salomon Fick, Bettey Mare, MD.  The care coordination team was consulted for assistance with disease management and care coordination needs.    Engaged with patient by telephone for follow up visit.  Recent office visits: 09/23/22 Peggye Pitt, MD - For seasonal allegies, nasal congesstion and sore throat. Start xyzal and albuterol hfa  09/22/22 Theresa Mulligan, LPN - For AWV - no medication changes  08/01/22 Abbe Amsterdam, MD - For General adult well exam - no med changes  Recent consult visits: 07/04/2022 Charlton Haws MD (cardiology) - Patient was seen for hypertension and additional concerns. Continued Allopurinol 100 mg daily.    04/09/2022 Max Hyatt DPM - Patient was seen for Type 2 diabetes mellitus without complication, without long-term current use of insulin and additional concerns. No medication changes.    03/27/2022 Oran Rein RD - Patient was seen for Diabetes mellitus without complication. No medication changes  Hospital visits: None in previous 6 months   Objective:  Lab Results  Component Value Date   CREATININE 0.78 08/01/2022   BUN 20 08/01/2022   GFR 75.73 08/01/2022   GFRNONAA 60 (L) 11/06/2020   GFRAA 86 02/29/2020   NA 141  08/01/2022   K 3.4 (L) 08/01/2022   CALCIUM 10.3 08/01/2022   CO2 31 08/01/2022   GLUCOSE 103 (H) 08/01/2022    Lab Results  Component Value Date/Time   HGBA1C 6.8 (H) 08/01/2022 10:37 AM   HGBA1C 6.3 02/07/2022 12:41 PM   HGBA1C 6.8 (H) 07/12/2021 10:16 AM   GFR 75.73 08/01/2022 10:37 AM   GFR 86.87 07/12/2021 10:16 AM   MICROALBUR 12.4 (H) 08/01/2022 10:37 AM    Last diabetic Eye exam:  Lab Results  Component Value Date/Time   HMDIABEYEEXA No Retinopathy 11/23/2019 12:00 AM    Last diabetic Foot exam: No results found for: "HMDIABFOOTEX"   Lab Results  Component Value Date   CHOL 223 (H) 08/01/2022   HDL 62.40 08/01/2022   LDLCALC 133 (H) 08/01/2022   TRIG 134.0 08/01/2022   CHOLHDL 4 08/01/2022       Latest Ref Rng & Units 08/01/2022   10:37 AM 07/12/2021   10:16 AM 03/27/2021    3:01 PM  Hepatic Function  Total Protein 6.0 - 8.3 g/dL 6.8  6.9  6.0   Albumin 3.5 - 5.2 g/dL 3.9  4.3    AST 0 - 37 U/L 15  15    ALT 0 - 35 U/L 12  10    Alk Phosphatase 39 - 117 U/L 55  61    Total Bilirubin 0.2 - 1.2 mg/dL 0.8  1.0      Lab Results  Component Value Date/Time   TSH 0.71 08/01/2022 10:37 AM   TSH 0.50 07/12/2021 10:16 AM  FREET4 0.77 08/01/2022 10:37 AM   FREET4 0.77 07/12/2021 10:16 AM       Latest Ref Rng & Units 08/01/2022   10:37 AM 07/12/2021   10:16 AM 10/29/2020   12:21 AM  CBC  WBC 4.0 - 10.5 K/uL 6.3  5.6  6.8   Hemoglobin 12.0 - 15.0 g/dL 16.1  09.6  04.5   Hematocrit 36.0 - 46.0 % 41.7  41.1  39.7   Platelets 150.0 - 400.0 K/uL 257.0  237.0  231     Lab Results  Component Value Date/Time   VITAMINB12 864 09/24/2020 02:01 PM   VITAMINB12 214 06/29/2019 10:49 AM    Clinical ASCVD: No  The 10-year ASCVD risk score (Arnett DK, et al., 2019) is: 77.2%   Values used to calculate the score:     Age: 70 years     Sex: Female     Is Non-Hispanic African American: Yes     Diabetic: Yes     Tobacco smoker: Yes     Systolic Blood Pressure: 184  mmHg     Is BP treated: Yes     HDL Cholesterol: 62.4 mg/dL     Total Cholesterol: 223 mg/dL    DEXA 4/0/98 - Normal BMD     09/23/2022   10:23 AM 09/22/2022    3:19 PM 09/26/2021    3:39 PM  Depression screen PHQ 2/9  Decreased Interest 0 0 0  Down, Depressed, Hopeless 0 0 0  PHQ - 2 Score 0 0 0  Altered sleeping 0    Tired, decreased energy 1    Change in appetite 0    Feeling bad or failure about yourself  0    Trouble concentrating 0    Moving slowly or fidgety/restless 0    Suicidal thoughts 0    PHQ-9 Score 1    Difficult doing work/chores Not difficult at all       Social History   Tobacco Use  Smoking Status Former   Packs/day: 0.25   Years: 9.00   Additional pack years: 0.00   Total pack years: 2.25   Types: Cigarettes   Quit date: 05/26/1976   Years since quitting: 46.3  Smokeless Tobacco Never   BP Readings from Last 3 Encounters:  09/23/22 (!) 184/89  08/01/22 (!) 150/80  07/04/22 128/78   Pulse Readings from Last 3 Encounters:  09/23/22 72  08/01/22 69  07/04/22 66   Wt Readings from Last 3 Encounters:  09/23/22 227 lb 1.6 oz (103 kg)  09/22/22 231 lb (104.8 kg)  08/01/22 227 lb 6.4 oz (103.1 kg)   BMI Readings from Last 3 Encounters:  09/23/22 42.91 kg/m  09/22/22 43.65 kg/m  08/01/22 43.68 kg/m    Allergies  Allergen Reactions   Strawberry (Diagnostic) Anaphylaxis and Other (See Comments)    hives   Farxiga [Dapagliflozin] Other (See Comments)    Had HAs, weakness, rhinorrhea, jitteriness, nasal congestion, nocturia, yeast infections, memory changes, irritability.   Penicillins Itching    Has patient had a PCN reaction causing immediate rash, facial/tongue/throat swelling, SOB or lightheadedness with hypotension: No Has patient had a PCN reaction causing severe rash involving mucus membranes or skin necrosis: No Has patient had a PCN reaction that required hospitalization: Unknown Has patient had a PCN reaction occurring within the  last 10 years: No If all of the above answers are "NO", then may proceed with Cephalosporin use.   Gabapentin Itching, Rash and Other (See Comments)  Incoherent   Tape Other (See Comments)    Blisters    Medications Reviewed Today     Reviewed by Sherrill Raring, RPH (Pharmacist) on 09/29/22 at 1343  Med List Status: <None>   Medication Order Taking? Sig Documenting Provider Last Dose Status Informant  albuterol (VENTOLIN HFA) 108 (90 Base) MCG/ACT inhaler [id number]  Inhale 2 puffs into the lungs every 4 (four) hours as needed for wheezing or shortness of breath. Philip Aspen, Limmie Patricia, MD  Active   allopurinol (ZYLOPRIM) 100 MG tablet [id number] No Take 1 tablet (100 mg total) by mouth daily. Candelaria Stagers, DPM Taking Active   blood glucose meter kit and supplies KIT [id number] No Dispense based on patient and insurance preference. Use up to four times daily as directed. Deeann Saint, MD Taking Active   Blood Pressure Monitoring (BLOOD PRESSURE CUFF) MISC [id number] No For daily monitoring as directed. Deeann Saint, MD Taking Active   Calcium-Magnesium-Vitamin D (CALCIUM 1200+D3 PO) [id number] No Take 600 mg by mouth daily. [provider] Taking Active   cholecalciferol (VITAMIN D) 25 MCG (1000 UNIT) tablet [id number] No Take 1,000 Units by mouth at bedtime.  [provider] Taking Active Multiple Informants  Cyanocobalamin ([facility] B12 GUMMIES PO) [id number] No Take 1,000 mcg by mouth daily. [provider] Taking Active   famotidine (PEPCID) 40 MG tablet [id number] No Take 1 tablet (40 mg total) by mouth in the morning. Deeann Saint, MD Taking Active   fluticasone [facility]) 50 MCG/ACT nasal spray [id number] No Place 1 spray into both nostrils daily. Deeann Saint, MD Taking Active   ipratropium-albuterol (DUONEB) 0.5-2.5 (3) MG/3ML SOLN [id number] No Take 3 mLs by nebulization every 6 (six) hours as needed (shortness of breath/wheezing). [provider] Taking Active Multiple Informants  levocetirizine (XYZAL) 5 MG tablet [id number]  Take 1 tablet (5 mg total) by mouth every evening. Philip Aspen, Limmie Patricia, MD  Active   magnesium oxide (MAG-OX) 400 MG tablet [id number] No Take 400 mg by mouth as needed. [provider] Taking Active Multiple Informants  mometasone-formoterol (DULERA) 100-5 MCG/ACT AERO [id number] No Inhale 2 puffs into the lungs 2 (two) times daily. Deeann Saint, MD Taking Active   montelukast (SINGULAIR) 10 MG tablet [id number] No Take 1 tablet (10 mg total) by mouth daily. Deeann Saint, MD Taking Active   nebivolol (BYSTOLIC) 10 MG tablet [id number] No TAKE 1 TABLET(10 MG) BY MOUTH DAILY  Patient taking differently: Take 10 mg by mouth 2 (two) times daily.   Deeann Saint, MD Taking Active   [facility] ULTRA test strip [id number] No USE TO TEST BLOOD SUGAR UP TO FOUR TIMES DAILY Deeann Saint, MD Taking Active   potassium chloride 20 MEQ/15ML (10%) SOLN [id number] No Take 15 mLs (20 mEq total) by mouth daily as needed (with each dose of torsemide). Deeann Saint, MD Taking Active   sacubitril-valsartan [facility]) 49-51 MG [id number] No Take 1 tablet by mouth 2 (two) times daily. Wendall Stade, MD Taking Active   torsemide (DEMADEX) 20 MG tablet [id number] No Take 1 tablet (20 mg total) by mouth daily as needed (swelling/fluid/weight gain). Wendall Stade, MD Taking Active   vitamin C (ASCORBIC ACID) 500 MG tablet [id number] No Take 500 mg by mouth daily. [provider] Taking Active             SDOH:  (Social Determinants of Health) assessments and interventions performed: Yes SDOH Interventions  Flowsheet Row Care Coordination from 09/29/2022 in [facility] Clinical Support from 09/22/2022 in [facility] [place] Clinical Support from 09/18/2021 in [facility] [place] Chronic Care Management from 08/28/2021 in [facility] [place] Clinical Support from 09/17/2020 in [facility] [place]  SDOH Interventions       Food Insecurity Interventions Intervention Not Indicated Intervention Not Indicated Intervention Not Indicated -- Intervention Not Indicated  Housing Interventions Intervention Not Indicated Intervention Not Indicated Intervention Not Indicated -- Intervention Not Indicated  Transportation Interventions -- Intervention Not Indicated Intervention Not Indicated Intervention Not Indicated Intervention Not Indicated  Utilities Interventions -- Intervention Not Indicated -- -- --  Alcohol Usage Interventions -- Intervention Not Indicated (Score <7) -- -- --  Financial Strain Interventions -- Intervention Not Indicated Intervention Not Indicated Intervention Not Indicated Intervention Not Indicated  Physical Activity Interventions -- Intervention Not Indicated Other (Comments)  [Knee complication. Followed by [facility]] -- Intervention Not Indicated  Stress Interventions -- Intervention Not Indicated Intervention Not Indicated -- Intervention Not Indicated  Social Connections Interventions -- Intervention Not Indicated -- -- Intervention Not Indicated       Medication Assistance: Entresto through PAP  Medication Access: Within the past 30 days, how often has patient missed a dose of medication? None Is a pillbox or other method used to improve adherence? Yes  Factors that may affect medication adherence? adverse effects of medications Are meds synced by current pharmacy? No  Are meds delivered by current pharmacy? Yes  Does patient experience delays in picking up medications due to transportation concerns? No   [facility] Reviewed: Is patient disadvantaged to use [facility]?: No  Current Rx insurance plan: [employer] Name and location of Current pharmacy:  [name] - [place], [place] - [address] AT [facility] [address] [facility] [address] [place] [address] Phone: [phone] Fax: [phone]  [facility] - [place], [place] - [address] [place] [address] Phone: [phone] Fax: [phone]  [facility] [place], [place] - [address] [place] [address] Phone: [phone] Fax: [phone]  [facility] - [place], [place] - [address] [place] [address] Phone: [phone] Fax: [phone]  [name] [place], [place] - [address] [place] [address] Phone: [phone] Fax: [phone]  [facility] services reviewed with patient today?: No  Patient requests to transfer care to [facility]?: No  Reason patient declined to change pharmacies: Not mentioned at this visit  Compliance/Adherence/Medication fill history: Care Gaps: AWV - completed 09/22/2022 Last eye exam - 11/23/2019 Last foot exam - 07/19/2022 Last A1C - 6.8 on 08/01/2022 Covid - postponed Shingirx - postponed  Star-Rating Drugs: Entresto 49-51mg  - getting through PAP   Assessment/Plan Hyperlipidemia: (LDL goal < 70) -Uncontrolled -Current treatment: None -Medications previously tried: None  -Current dietary patterns: admits to very poor eating habits recently -Current exercise habits: no exercise, has pool membership and is looking at getting back into that -Educated on Cholesterol goals;  Benefits of statin for ASCVD risk reduction; Importance of limiting foods high in cholesterol; Exercise goal of 150 minutes per week; -Counseled on diet and exercise extensively Recommended starting a statin, patient declines and wants to try  diet to get LDL at goal   Heart Failure (Goal: manage symptoms and prevent exacerbations) -Not ideally controlled -Last ejection  fraction: 60-65% (Date: 02/25/21) -HF type: HFpEF (EF > 50%) -NYHA Class: II (slight limitation of activity) -AHA HF Stage: C (Heart disease and symptoms present) -Current treatment: Bystolic 10mg  1 BID Appropriate, Effective, Safe, Accessible Entresto 49-51mg  1 BID Appropriate, Effective, Safe, Accessible Torsemide 20mg  1 prn swelling/weight gain Appropriate, Effective, Safe, Accessible -Medications previously tried: [place], Lasix, Carvedilol -Current home BP/HR readings: no cuff that fits her arm currently, will get rx re-printed for patient so she can fill -Current home daily weights: checking weight daily -Current dietary habits: mindful of salt intake -Current exercise habits: not discussed -Educated on Benefits of medications for managing symptoms and prolonging life Proper diuretic administration and potassium supplementation Importance of blood pressure control -Recommended to continue current medication  Sherrill Raring Clinical Pharmacist [phone]

## 2022-09-26 NOTE — Progress Notes (Signed)
Care Management & Coordination Services Pharmacy Team  Reason for Encounter: Appointment Reminder  Contacted patient to confirm telephone appointment with Delano Metz, PharmD on 09/29/2022 at 1:30. Spoke with patient on 09/26/2022   Do you have any problems getting your medications? Patient denies  What is your top health concern you would like to discuss at your upcoming visit? Patient denies  Have you seen any other providers since your last visit with PCP? Patient denies  Care Gaps: AWV - completed 09/22/2022 Last eye exam - 11/23/2019 Last foot exam - 07/19/2022 Last A1C - 6.8 on 08/01/2022 Covid - postponed Shingirx - postponed   Star Rating Drugs: None   Inetta Fermo [facility]  Clinical Pharmacist Assistant [phone]

## 2022-09-29 ENCOUNTER — Ambulatory Visit: Payer: Medicare PPO

## 2022-10-01 ENCOUNTER — Encounter: Payer: Self-pay | Admitting: Family Medicine

## 2022-10-03 ENCOUNTER — Telehealth: Payer: Self-pay

## 2022-10-03 ENCOUNTER — Other Ambulatory Visit: Payer: Self-pay

## 2022-10-03 DIAGNOSIS — I10 Essential (primary) hypertension: Secondary | ICD-10-CM

## 2022-10-03 MED ORDER — BLOOD PRESSURE CUFF MISC
0 refills | Status: AC
Start: 2022-10-03 — End: ?

## 2022-10-03 MED ORDER — NEBIVOLOL HCL 20 MG PO TABS: 20.0000 mg | ORAL_TABLET | Freq: Every day | ORAL | 1 refills | Status: DC

## 2022-10-03 NOTE — Telephone Encounter (Signed)
Rx sent 

## 2022-10-03 NOTE — Telephone Encounter (Signed)
Pt called in stating that she was out of her Bystolic 10mg  that she had been taking twice daily, and rx still said once daily and was too soon to be filled at pharmacy.  Explained to patient that medication is designed to be taken once daily. Consulted with PCP and rx for Bystolic 20mg  ONCE DAILY will be sent to the pharmacy.  Stressed that it needs to be taken as stated on the bottle and if any issues/concerns/questions about directions in the future, to please contact the office immediately.  Sherrill Raring Clinical Pharmacist [phone]

## 2022-10-28 ENCOUNTER — Encounter: Payer: Self-pay | Admitting: Family Medicine

## 2022-10-28 DIAGNOSIS — E1169 Type 2 diabetes mellitus with other specified complication: Secondary | ICD-10-CM

## 2022-11-25 ENCOUNTER — Encounter: Payer: Self-pay | Admitting: Family Medicine

## 2022-11-25 NOTE — Telephone Encounter (Signed)
Note available in chart and copy also sent by mail.

## 2022-12-15 ENCOUNTER — Telehealth: Payer: Self-pay | Admitting: Family Medicine

## 2022-12-15 DIAGNOSIS — R053 Chronic cough: Secondary | ICD-10-CM

## 2022-12-15 NOTE — Telephone Encounter (Signed)
Prescription Request  12/15/2022  LOV: 08/01/2022  What is the name of the medication or equipment?   famotidine famotidine (PEPCID) 40 MG tablet  Nebivolol HCl Nebivolol HCl 20 MG TABS  Have you contacted your pharmacy to request a refill? No   Which pharmacy would you like this sent to?   [facility] - [place], [place] - [address] [place] [address] Phone: [phone] Fax: [phone]  Patient notified that their request is being sent to the clinical staff for review and that they should receive a response within 2 business days.   Please advise at Mobile [phone] (mobile)

## 2022-12-17 MED ORDER — NEBIVOLOL HCL 20 MG PO TABS: 20.0000 mg | ORAL_TABLET | Freq: Every day | ORAL | 3 refills | Status: DC

## 2022-12-17 MED ORDER — FAMOTIDINE 40 MG PO TABS
40.0000 mg | ORAL_TABLET | Freq: Every morning | ORAL | 3 refills | Status: DC
Start: 2022-12-17 — End: 2023-07-30

## 2022-12-17 NOTE — Telephone Encounter (Signed)
Rx's sent in as requested. 

## 2022-12-31 ENCOUNTER — Other Ambulatory Visit: Payer: Self-pay | Admitting: Podiatry

## 2023-01-07 DIAGNOSIS — Z0289 Encounter for other administrative examinations: Secondary | ICD-10-CM

## 2023-01-08 ENCOUNTER — Ambulatory Visit: Payer: Medicare PPO | Admitting: Cardiovascular Disease

## 2023-01-12 ENCOUNTER — Ambulatory Visit: Payer: Medicare PPO | Attending: Cardiovascular Disease | Admitting: Physician Assistant

## 2023-01-12 ENCOUNTER — Other Ambulatory Visit: Payer: Self-pay | Admitting: Internal Medicine

## 2023-01-12 ENCOUNTER — Encounter: Payer: Self-pay | Admitting: Physician Assistant

## 2023-01-12 ENCOUNTER — Other Ambulatory Visit: Payer: Self-pay | Admitting: Physician Assistant

## 2023-01-12 ENCOUNTER — Ambulatory Visit (INDEPENDENT_AMBULATORY_CARE_PROVIDER_SITE_OTHER): Payer: Medicare PPO | Admitting: Physician Assistant

## 2023-01-12 ENCOUNTER — Encounter (INDEPENDENT_AMBULATORY_CARE_PROVIDER_SITE_OTHER): Payer: Self-pay | Admitting: Physician Assistant

## 2023-01-12 VITALS — BP 164/76 | HR 70 | Temp 98.6°F | Ht 59.5 in | Wt 220.0 lb

## 2023-01-12 VITALS — BP 170/98 | HR 63 | Ht 59.0 in | Wt 224.0 lb

## 2023-01-12 DIAGNOSIS — E119 Type 2 diabetes mellitus without complications: Secondary | ICD-10-CM | POA: Diagnosis not present

## 2023-01-12 DIAGNOSIS — E1159 Type 2 diabetes mellitus with other circulatory complications: Secondary | ICD-10-CM | POA: Diagnosis not present

## 2023-01-12 DIAGNOSIS — E1169 Type 2 diabetes mellitus with other specified complication: Secondary | ICD-10-CM | POA: Diagnosis not present

## 2023-01-12 DIAGNOSIS — I5032 Chronic diastolic (congestive) heart failure: Secondary | ICD-10-CM

## 2023-01-12 DIAGNOSIS — R0609 Other forms of dyspnea: Secondary | ICD-10-CM

## 2023-01-12 DIAGNOSIS — E859 Amyloidosis, unspecified: Secondary | ICD-10-CM | POA: Diagnosis not present

## 2023-01-12 DIAGNOSIS — E669 Obesity, unspecified: Secondary | ICD-10-CM

## 2023-01-12 DIAGNOSIS — I3139 Other pericardial effusion (noninflammatory): Secondary | ICD-10-CM

## 2023-01-12 DIAGNOSIS — I152 Hypertension secondary to endocrine disorders: Secondary | ICD-10-CM

## 2023-01-12 DIAGNOSIS — E785 Hyperlipidemia, unspecified: Secondary | ICD-10-CM | POA: Diagnosis not present

## 2023-01-12 DIAGNOSIS — I420 Dilated cardiomyopathy: Secondary | ICD-10-CM

## 2023-01-12 DIAGNOSIS — I10 Essential (primary) hypertension: Secondary | ICD-10-CM | POA: Diagnosis not present

## 2023-01-12 DIAGNOSIS — Z6841 Body Mass Index (BMI) 40.0 and over, adult: Secondary | ICD-10-CM

## 2023-01-12 NOTE — Patient Instructions (Signed)
Medication Instructions:   Your physician recommends that you continue on your current medications as directed. Please refer to the Current Medication list given to you today.   *If you need a refill on your cardiac medications before your next appointment, please call your pharmacy*   Lab Work: .NONE ORDERED  TODAY    If you have labs (blood work) drawn today and your tests are completely normal, you will receive your results only by: MyChart Message (if you have MyChart) OR A paper copy in the mail If you have any lab test that is abnormal or we need to change your treatment, we will call you to review the results.   Testing/Procedures: NONE ORDERED  TODAY    Follow-Up: At [facility], you and your health needs are our priority.  As part of our continuing mission to provide you with exceptional heart care, we have created designated Provider Care Teams.  These Care Teams include your primary Cardiologist (physician) and Advanced Practice Providers (APPs -  Physician Assistants and Nurse Practitioners) who all work together to provide you with the care you need, when you need it.  We recommend signing up for the patient portal called "MyChart".  Sign up information is provided on this After Visit Summary.  MyChart is used to connect with patients for Virtual Visits (Telemedicine).  Patients are able to view lab/test results, encounter notes, upcoming appointments, etc.  Non-urgent messages can be sent to your provider as well.   To learn more about what you can do with MyChart, go to [email or web address].    Your next appointment:   6 month(s)  Provider:   Charlton Haws, MD    Other Instructions  IN  2 WEEKS CALL CLINIC  WITH BLOOD PRESSURE READINGS.( MAKE SURE YOU TAKE BLOOD PRESSURE 30 MINUTES TO A HOUR AFTER TAKING MEDICINES)

## 2023-01-12 NOTE — Progress Notes (Signed)
Office: [phone]  /  Fax: [phone]   Initial Visit  Beth Macias was seen in clinic today to evaluate for obesity. She is interested in losing weight to improve overall health and reduce the risk of weight related complications. She presents today to review program treatment options, initial physical assessment, and evaluation.     She was referred by: PCP- Dr. Abbe Amsterdam Husband is retired Chief Financial Officer / Daughter who is Pediatrician recommended she come to the [facility] clinic.  Retired Runner, broadcasting/film/video.   When asked what else they would like to accomplish? She states: Adopt healthier eating patterns, Improve energy levels and physical activity, Improve existing medical conditions, Reduce number of medications, Improve quality of life, and Improve appearance  Weight history: Some weight gain after pregnancies. C section x 3.    Recent Dx of Type 2 diabetes and HF- Seeing cardiology later today.   When asked how has your weight affected you? She states: Contributed to medical problems, Contributed to orthopedic problems or mobility issues, Having fatigue, and Having poor endurance  Some associated conditions: Hypertension, Arthritis:Back, Hyperlipidemia, Diabetes, Overactive bladder, Heart disease, and Vitamin D Deficiency  Contributing factors: Family history, Reduced physical activity, and Pregnancy  Weight promoting medications identified: Beta-blockers  Current nutrition plan: Other: Health food supplements to help with blood sugar control  Current level of physical activity: Other: pool walking  Current or previous pharmacotherapy: None  Response to medication: Never tried medications    Does not ;want to take metformin due to side effects in past.     May be interested in GLP-1RA medications due to multiple concerns.   Past medical history includes:   Past Medical History:  Diagnosis Date   Asthma    Blood in stool    CHF (congestive heart failure) (HCC)    Colon polyps     Diabetes mellitus without complication (HCC)    Diverticulitis    Fibromyalgia    GERD (gastroesophageal reflux disease)    Gout    Hypercholesteremia    Hypertension    Pneumonia    Pneumonia    UTI (urinary tract infection)      Objective:   BP (!) 164/76   Pulse 70   Temp 98.6 F (37 C)   Ht 4' 11.5" (1.511 m)   Wt 220 lb (99.8 kg)   SpO2 96%   BMI 43.69 kg/m  She was weighed on the bioimpedance scale: Body mass index is 43.69 kg/m.  Peak Weight:230 lbs , Body Fat%:56.5%, Visceral Fat Rating:21, Weight trend over the last 12 months: Decreasing  General:  Alert, oriented and cooperative. Patient is in no acute distress.  Respiratory: Normal respiratory effort, no problems with respiration noted   Gait: able to ambulate independently  Mental Status: Normal mood and affect. Normal behavior. Normal judgment and thought content.   DIAGNOSTIC DATA REVIEWED:  BMET    Component Value Date/Time   NA 141 08/01/2022 1037   NA 144 02/29/2020 1010   K 3.4 (L) 08/01/2022 1037   CL 103 08/01/2022 1037   CO2 31 08/01/2022 1037   GLUCOSE 103 (H) 08/01/2022 1037   BUN 20 08/01/2022 1037   BUN 21 02/29/2020 1010   CREATININE 0.78 08/01/2022 1037   CALCIUM 10.3 08/01/2022 1037   GFRNONAA 60 (L) 11/06/2020 1114   GFRAA 86 02/29/2020 1010   Lab Results  Component Value Date   HGBA1C 6.8 (H) 08/01/2022   HGBA1C 7.2 (H) 06/29/2019   No results found for: "INSULIN"  CBC    Component Value Date/Time   WBC 6.3 08/01/2022 1037   RBC 4.81 08/01/2022 1037   HGB 13.8 08/01/2022 1037   HCT 41.7 08/01/2022 1037   PLT 257.0 08/01/2022 1037   MCV 86.7 08/01/2022 1037   MCH 28.0 10/29/2020 0021   MCHC 33.0 08/01/2022 1037   RDW 14.5 08/01/2022 1037   Iron/TIBC/Ferritin/ %Sat No results found for: "IRON", "TIBC", "FERRITIN", "IRONPCTSAT" Lipid Panel     Component Value Date/Time   CHOL 223 (H) 08/01/2022 1037   CHOL 191 02/29/2020 1010   TRIG 134.0 08/01/2022 1037   HDL  62.40 08/01/2022 1037   HDL 59 02/29/2020 1010   CHOLHDL 4 08/01/2022 1037   VLDL 26.8 08/01/2022 1037   LDLCALC 133 (H) 08/01/2022 1037   LDLCALC 109 (H) 02/29/2020 1010   Hepatic Function Panel     Component Value Date/Time   PROT 6.8 08/01/2022 1037   PROT 6.0 03/27/2021 1501   ALBUMIN 3.9 08/01/2022 1037   ALBUMIN 3.9 02/29/2020 1010   AST 15 08/01/2022 1037   ALT 12 08/01/2022 1037   ALKPHOS 55 08/01/2022 1037   BILITOT 0.8 08/01/2022 1037   BILITOT 0.7 02/29/2020 1010      Component Value Date/Time   TSH 0.71 08/01/2022 1037     Assessment and Plan:   Type 2 diabetes mellitus with other specified complication, without long-term current use of insulin (HCC)  Chronic heart failure with preserved ejection fraction (HCC)  Hypertension associated with type 2 diabetes mellitus (HCC)  Hyperlipidemia associated with type 2 diabetes mellitus (HCC)        Obesity Treatment / Action Plan:  Patient will work on garnering support from family and friends to begin weight loss journey. Will work on eliminating or reducing the presence of highly palatable, calorie dense foods in the home. Will complete provided nutritional and psychosocial assessment questionnaire before the next appointment. Will be scheduled for indirect calorimetry to determine resting energy expenditure in a fasting state.  This will allow [place] to create a reduced calorie, high-protein meal plan to promote loss of fat mass while preserving muscle mass. Will think about ideas on how to incorporate physical activity into their daily routine. Will work on reducing intake of added sugars, simple sugars and processed carbs. Counseled on the health benefits of losing 5%-15% of total body weight. Was counseled on nutritional approaches to weight loss and benefits of reducing processed foods and consuming plant-based foods and high quality protein as part of nutritional weight management. Was counseled on  pharmacotherapy and role as an adjunct in weight management.   Obesity Education Performed Today:  She was weighed on the bioimpedance scale and results were discussed and documented in the synopsis.  We discussed obesity as a disease and the importance of a more detailed evaluation of all the factors contributing to the disease.  We discussed the importance of long term lifestyle changes which include nutrition, exercise and behavioral modifications as well as the importance of customizing this to her specific health and social needs.  We discussed the benefits of reaching a healthier weight to alleviate the symptoms of existing conditions and reduce the risks of the biomechanical, metabolic and psychological effects of obesity.  Beth Macias appears to be in the action stage of change and states they are ready to start intensive lifestyle modifications and behavioral modifications.  30 minutes was spent today on this visit including the above counseling, pre-visit chart review, and post-visit documentation.  Reviewed by clinician on day of visit: allergies, medications, problem list, medical history, surgical history, family history, social history, and previous encounter notes pertinent to obesity diagnosis.   Lailoni Baquera,PA-C

## 2023-01-12 NOTE — Progress Notes (Signed)
Ordered TC pyrophosphate scan to rule out possible amyloid.  Patient will be called to notify her.  Sharlene Dory, PA-C

## 2023-01-12 NOTE — Progress Notes (Signed)
Cardiology Office Note:  .   Date:  01/12/2023  ID:  Dicky Doe, DOB February 16, 1951, MRN [id number] PCP: Deeann Saint, MD  [facility] Providers Cardiologist:  Charlton Haws, MD {  History of Present Illness: .   Beth Macias is a 72 y.o. female With a past medical history of CHF, diabetes mellitus without complication, fibromyalgia, gout, HTN, HLD who is here for 69-month follow-up appointment.  History includes LVEF 30 to 35% in June 2020 with moderate MR, small to moderate size pericardial effusion.  Myoview done 12/13/2018 with normal perfusion LVEF 32%.  She has had issues with hives since her 30s.  Got worse when she was started on pulmonary and CHF meds.  Not clear culprit.  Improved and not thought to be from heart meds.  Seen 04/01/2019 by Ilda Basset.D. and Entresto dose kept with increasing Coreg.  Patient does not like taking diuretics when she goes out.  Did not tolerate max dose Entresto.  TTE done 05/15/2020 with improved LVEF 50 to 25% with moderate pericardial effusion, no tamponade, MR only trivial.  TTE done 10/28/2020 with LVEF 55 to 60% no change in effusion.  AV sclerosis.  TTE 03/24/2021 with LVEF 60 to 65%, speckled with moderate LVH, AV sclerosis.  Patient was to have cardiac MRI amyloid TC study but never done to rule out amyloid.  Was in the hospital 6/4 through 6 2022 for asthma flare.  Exposed to some allergens.  CXR was okay BNP was 49.  BP was elevated.  Spends time with her granddaughter on a daily basis.  Discussed need to order TC pyrophosphate scan to rule out possible amyloid given TTE appearance and cardiomyopathy  Today, she tells me she does not remember the conversation about getting a test to rule out amyloid.  We went over the reasoning for the testing.  We did order this today.  Blood pressure is elevated and has been her last 3 visits.  She tells me she has terrible whitecoat syndrome and driving down the highway makes her very anxious and  then elevates her blood pressure.  At home values have been much better controlled.  Would recommend keeping a blood pressure log and bring it at her next visit.  She has been working closely with healthy weight and wellness to lose some weight and get on a healthier regimen.  She wants to be able to spend more time with her grandkids and have more energy.  She does have some shortness of breath with exertion.  Reports no chest pain, pressure, or tightness. No edema, orthopnea, PND. Reports no palpitations.  ROS: Pertinent ROS in HPI  Studies Reviewed: .       Echocardiogram 02/25/2021 IMPRESSIONS     1. Given effuson and "speckled" pattern on LV consider f/u MRI to r/o  amyloid . Left ventricular ejection fraction, by estimation, is 60 to 65%.  The left ventricle has normal function. The left ventricle has no regional  wall motion abnormalities. There  is moderate left ventricular hypertrophy. Left ventricular diastolic  parameters are consistent with Grade I diastolic dysfunction (impaired  relaxation).   2. Right ventricular systolic function is normal. The right ventricular  size is normal.   3. Left atrial size was mildly dilated.   4. Small to moderate.   5. The mitral valve is normal in structure. No evidence of mitral valve  regurgitation. No evidence of mitral stenosis.   6. The aortic valve is tricuspid. Aortic valve regurgitation  is not  visualized. Mild to moderate aortic valve sclerosis/calcification is  present, without any evidence of aortic stenosis.   7. The inferior vena cava is normal in size with greater than 50%  respiratory variability, suggesting right atrial pressure of 3 mmHg.   FINDINGS   Left Ventricle: Given effuson and "speckled" pattern on LV consider f/u  MRI to r/o amyloid. Left ventricular ejection fraction, by estimation, is  60 to 65%. The left ventricle has normal function. The left ventricle has  no regional wall motion  abnormalities. The left  ventricular internal cavity size was normal in  size. There is moderate left ventricular hypertrophy. Left ventricular  diastolic parameters are consistent with Grade I diastolic dysfunction  (impaired relaxation).   Right Ventricle: The right ventricular size is normal. No increase in  right ventricular wall thickness. Right ventricular systolic function is  normal.   Left Atrium: Left atrial size was mildly dilated.   Right Atrium: Right atrial size was normal in size.   Pericardium: No change in effusion since 10/28/20 Largest posterior to LV.  No tamponade. Small to moderate.   Mitral Valve: The mitral valve is normal in structure. No evidence of  mitral valve regurgitation. No evidence of mitral valve stenosis.   Tricuspid Valve: The tricuspid valve is normal in structure. Tricuspid  valve regurgitation is mild . No evidence of tricuspid stenosis.   Aortic Valve: The aortic valve is tricuspid. Aortic valve regurgitation is  not visualized. Mild to moderate aortic valve sclerosis/calcification is  present, without any evidence of aortic stenosis.   Pulmonic Valve: The pulmonic valve was normal in structure. Pulmonic valve  regurgitation is not visualized. No evidence of pulmonic stenosis.   Aorta: The aortic root is normal in size and structure.   Venous: The inferior vena cava is normal in size with greater than 50%  respiratory variability, suggesting right atrial pressure of 3 mmHg.   IAS/Shunts: No atrial level shunt detected by color flow Doppler.   Additional Comments: Mild ascites is present.      Physical Exam:   VS:  BP (!) 170/98   Pulse 63   Ht 4\' 11"  (1.499 m)   Wt 224 lb (101.6 kg)   SpO2 98%   BMI 45.24 kg/m    Wt Readings from Last 3 Encounters:  01/12/23 224 lb (101.6 kg)  01/12/23 220 lb (99.8 kg)  09/23/22 227 lb 1.6 oz (103 kg)    GEN: Well nourished, well developed in no acute distress NECK: No JVD; No carotid bruits CARDIAC: RRR, no murmurs,  rubs, gallops RESPIRATORY:  Clear to auscultation without rales, wheezing or rhonchi  ABDOMEN: Soft, non-tender, non-distended EXTREMITIES:  No edema; No deformity   ASSESSMENT AND PLAN: .   1.  CHF -focusing on her health -summer has been hard, SOB lately -grows some of her own food and is making an effort to make healthier choices -She would really like to lose some weight and is following with healthy weight and wellness -Euvolemic on exam -Would continue current medication regimen which includes Entresto 49/51 mg twice a day and Demadex 20 mg as needed for swelling  2.  Pulmonary -Continue current inhaled medication regimen including DuoNebs as needed, Dulera 2 puffs twice a day, Singulair, and as needed Flonase   3.  GERD -well controlled -no recent issues   4.  Pericardial effusion -no issues and no signs of tamponade  5. HTN -elevated today but patient states it is much lower  at home -Suspect an element of whitecoat syndrome -I have asked her to maintain a low-sodium, heart healthy diet -I have also asked her to keep a blood pressure log for the next 2 weeks and send me those values     Dispo: Plan to follow-up in 6 months with Dr. Eden Emms  Signed, Sharlene Dory, PA-C

## 2023-01-13 ENCOUNTER — Other Ambulatory Visit (HOSPITAL_COMMUNITY): Payer: Self-pay | Admitting: Physician Assistant

## 2023-01-13 DIAGNOSIS — R0609 Other forms of dyspnea: Secondary | ICD-10-CM

## 2023-01-16 ENCOUNTER — Telehealth: Payer: Self-pay | Admitting: Cardiovascular Disease

## 2023-01-16 ENCOUNTER — Telehealth: Payer: Self-pay | Admitting: Family Medicine

## 2023-01-16 NOTE — Telephone Encounter (Signed)
Left message for the patient to contact the office today before 5 pm or on Monday at 8 am once the office reopens.

## 2023-01-16 NOTE — Telephone Encounter (Signed)
Called patient left a Detailed voicemail

## 2023-01-16 NOTE — Telephone Encounter (Signed)
Pt is requesting a callback regarding yesterday's appt. She has some questions. Please advise

## 2023-01-16 NOTE — Telephone Encounter (Signed)
Pt misplaced order for blood pressure cuff, requesting another order and asking where they can obtain a blood pressure cuff. Requesting a call with guidance

## 2023-01-16 NOTE — Telephone Encounter (Signed)
Local pharmacy, [facility], [facility], online sites such as [employer].

## 2023-01-20 ENCOUNTER — Encounter (HOSPITAL_COMMUNITY): Payer: Medicare PPO

## 2023-01-22 ENCOUNTER — Other Ambulatory Visit: Payer: Self-pay | Admitting: Podiatry

## 2023-01-22 ENCOUNTER — Ambulatory Visit (HOSPITAL_COMMUNITY): Payer: Medicare PPO | Attending: Physician Assistant

## 2023-01-22 DIAGNOSIS — R0609 Other forms of dyspnea: Secondary | ICD-10-CM

## 2023-01-22 MED ORDER — TECHNETIUM TC 99M PYROPHOSPHATE
21.2000 | Freq: Once | INTRAVENOUS | Status: AC
  Administered 2023-01-22: 21.2 via INTRAVENOUS

## 2023-01-22 NOTE — Telephone Encounter (Signed)
Left message for patient to call back  

## 2023-01-22 NOTE — Telephone Encounter (Signed)
Left message for the patient to contact the office with her questions.

## 2023-01-22 NOTE — Telephone Encounter (Signed)
  Pt is returning call, she said, she couldn't remember her call last Monday and she is getting ready for her appt today. She said, she she has some questions she will ask it later

## 2023-01-23 ENCOUNTER — Encounter (HOSPITAL_COMMUNITY): Payer: Self-pay

## 2023-01-23 ENCOUNTER — Telehealth: Payer: Self-pay | Admitting: Cardiovascular Disease

## 2023-01-23 ENCOUNTER — Telehealth (HOSPITAL_COMMUNITY): Payer: Self-pay | Admitting: *Deleted

## 2023-01-23 NOTE — Telephone Encounter (Signed)
Patient states she broke out on her arm as a reaction to her injection on 8/29. She states it is burning and itching at the site and she would like to know if this is normal. Patient would like to know what can be done to reduce symptoms. Please advise.

## 2023-01-23 NOTE — Telephone Encounter (Signed)
Patient had amyloid study yesterday.  I checked with nuclear medicine and patient was given a diagnostic injection.  She should not have a reaction to this.  I spoke with patient. She reports she had injection yesterday in her right arm just below the bend in her arm.  This morning she noticed at least 5-6 pea sized bumps or whelps on her right arm from her shoulder to her elbow.  May be more but she is unable to see the back of her shoulder.  Areas are red and itchy.   I let patient know this should not be from injection yesterday.  She denies any new laundry detergent, soap or anything else new. I advised her if bumps do not improve she should see PCP or go to Urgent Care.  Patient requests name of injection she was given yesterday.  She would like this information send to her through my chart. Will ask nuclear medicine to assist with this.

## 2023-01-24 DIAGNOSIS — R21 Rash and other nonspecific skin eruption: Secondary | ICD-10-CM | POA: Diagnosis not present

## 2023-01-28 ENCOUNTER — Encounter (INDEPENDENT_AMBULATORY_CARE_PROVIDER_SITE_OTHER): Payer: Self-pay

## 2023-01-28 ENCOUNTER — Telehealth (INDEPENDENT_AMBULATORY_CARE_PROVIDER_SITE_OTHER): Payer: Self-pay | Admitting: Family Medicine

## 2023-01-28 NOTE — Telephone Encounter (Signed)
I contacted the patient to reschedule the appointment with Dr. Sharee Holster on 03/11/2023 as she is currently out of the office. Sent mychart message as well

## 2023-01-29 NOTE — Telephone Encounter (Signed)
I contacted the patient to reschedule the appointment with Dr. Sharee Holster on 03/11/2023 as she is currently out of the office. Sent mychart message as well

## 2023-01-30 ENCOUNTER — Other Ambulatory Visit: Payer: Self-pay | Admitting: *Deleted

## 2023-01-30 ENCOUNTER — Telehealth: Payer: Self-pay | Admitting: *Deleted

## 2023-01-30 ENCOUNTER — Telehealth: Payer: Self-pay

## 2023-01-30 DIAGNOSIS — R9439 Abnormal result of other cardiovascular function study: Secondary | ICD-10-CM

## 2023-01-30 DIAGNOSIS — E859 Amyloidosis, unspecified: Secondary | ICD-10-CM

## 2023-01-30 DIAGNOSIS — R931 Abnormal findings on diagnostic imaging of heart and coronary circulation: Secondary | ICD-10-CM

## 2023-01-30 NOTE — Telephone Encounter (Signed)
-----   Message from Sharlene Dory sent at 01/29/2023  1:10 PM EDT ----- [name],  Your myocardial uptake was positive and the results suggest possible amyloidosis.  It was recommended that we send you for cardiac MRI for further evaluation.  We will go ahead and get this ordered.  I have attached Dr. Eden Emms as well on this message to make him aware.  If you have questions please let us know.  Please order cardiac MRI to r/o amyloidosis  Sharlene Dory, PA-C

## 2023-01-30 NOTE — Telephone Encounter (Signed)
-----   Message from Charlton Haws sent at 01/30/2023  5:14 PM EDT ----- Spoke with patient she will have MRI will need labs Hct/BMET prior to Make sure it is pre certified with her insurance for amyloid abnormal echo and Tyc Pryophosphate study ----- Message ----- From: Oleta Mouse, CMA Sent: 01/30/2023   4:55 PM EDT To: Wendall Stade, MD; Ethelda Chick, RN  Spoke with patient aware of results and verbalized understanding. Patient would like to talk more about  Cardiac Mri with Dr Eden Emms before making final decision if she should do this. Patient mentioned she notice her blood pressure has been elevated lately and wanted to come in to see Dr Eden Emms. Patient was asked if she is keep her  readings and she said she is. Patient was told that I will reach to Dr, Eden Emms nurse Elita Quick to see if she can assist with getting Dr Eden Emms to call her back or give feedback sooner than later on Cardiac Mri.

## 2023-01-30 NOTE — Telephone Encounter (Signed)
Lvm for patient to call back to discuss results and new recommendations call [phone] and ask for me [facility]

## 2023-01-30 NOTE — Telephone Encounter (Signed)
Placed order for lab work. MRI has already been ordered.

## 2023-02-25 NOTE — Progress Notes (Signed)
   02/25/2023  Patient ID: Beth Macias, female   DOB: 07/29/1950, 72 y.o.   MRN: [id number]  Attempted to contact patient for scheduled appointment for medication management. Left HIPAA compliant message for patient to return my call at their convenience.   Sherrill Raring, PharmD Clinical Pharmacist [phone]

## 2023-03-03 ENCOUNTER — Ambulatory Visit (INDEPENDENT_AMBULATORY_CARE_PROVIDER_SITE_OTHER): Payer: Medicare PPO | Admitting: Family Medicine

## 2023-03-04 ENCOUNTER — Ambulatory Visit: Payer: Medicare PPO | Attending: Cardiovascular Disease

## 2023-03-04 DIAGNOSIS — E859 Amyloidosis, unspecified: Secondary | ICD-10-CM | POA: Diagnosis not present

## 2023-03-04 DIAGNOSIS — R931 Abnormal findings on diagnostic imaging of heart and coronary circulation: Secondary | ICD-10-CM

## 2023-03-04 DIAGNOSIS — R9439 Abnormal result of other cardiovascular function study: Secondary | ICD-10-CM

## 2023-03-05 LAB — BASIC METABOLIC PANEL
BUN/Creatinine Ratio: 19 (ref 12–28)
BUN: 15 mg/dL (ref 8–27)
CO2: 27 mmol/L (ref 20–29)
Calcium: 10.1 mg/dL (ref 8.7–10.3)
Chloride: 103 mmol/L (ref 96–106)
Creatinine, Ser: 0.78 mg/dL (ref 0.57–1.00)
Glucose: 119 mg/dL — ABNORMAL HIGH (ref 70–99)
Potassium: 3.4 mmol/L — ABNORMAL LOW (ref 3.5–5.2)
Sodium: 145 mmol/L — ABNORMAL HIGH (ref 134–144)
eGFR: 80 mL/min/{1.73_m2} (ref >59)

## 2023-03-05 LAB — HEMATOCRIT: Hematocrit: 38.2 % (ref 34.0–46.6)

## 2023-03-09 ENCOUNTER — Encounter (HOSPITAL_COMMUNITY): Payer: Self-pay

## 2023-03-09 NOTE — Telephone Encounter (Signed)
Called patient about lab results. Patient  is concerned about her MRI this week. 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 Patient wants to know if she needs to postpone her MRI or will it be okay. Patient asked for [facility] to call her to discuss.

## 2023-03-10 ENCOUNTER — Telehealth (HOSPITAL_COMMUNITY): Payer: Self-pay | Admitting: *Deleted

## 2023-03-10 NOTE — Telephone Encounter (Signed)
Attempted to call patient regarding upcoming cardiac MRI appointment. Left message on voicemail with name and callback number Johney Frame RN Navigator Cardiac Imaging [facility] [phone] Office

## 2023-03-10 NOTE — Telephone Encounter (Signed)
Reaching out to patient to offer assistance regarding upcoming cardiac imaging study; pt verbalizes understanding of appt date/time, parking situation and where to check in, pre-test NPO status and medications ordered, and verified current allergies; name and call back number provided for further questions should they arise Johney Frame RN Navigator Cardiac Imaging [facility] [phone] office [phone] cell   Patient denies metal and claustrophobia.

## 2023-03-11 ENCOUNTER — Ambulatory Visit (HOSPITAL_COMMUNITY)
Admission: RE | Admit: 2023-03-11 | Discharge: 2023-03-11 | Disposition: A | Discharge status: Home / Self Care | Payer: Medicare PPO | Source: Ambulatory Visit | Attending: Physician Assistant | Admitting: Physician Assistant

## 2023-03-11 ENCOUNTER — Ambulatory Visit (INDEPENDENT_AMBULATORY_CARE_PROVIDER_SITE_OTHER): Payer: Medicare PPO | Admitting: Family Medicine

## 2023-03-11 ENCOUNTER — Other Ambulatory Visit: Payer: Self-pay | Admitting: Physician Assistant

## 2023-03-11 ENCOUNTER — Other Ambulatory Visit (HOSPITAL_COMMUNITY): Payer: Medicare PPO

## 2023-03-11 DIAGNOSIS — E859 Amyloidosis, unspecified: Secondary | ICD-10-CM | POA: Insufficient documentation

## 2023-03-11 MED ORDER — GADOBUTROL 1 MMOL/ML IV SOLN
10.0000 mL | Freq: Once | INTRAVENOUS | Status: AC | PRN
  Administered 2023-03-11: 10 mL via INTRAVENOUS

## 2023-03-12 ENCOUNTER — Telehealth: Payer: Self-pay

## 2023-03-12 DIAGNOSIS — I3139 Other pericardial effusion (noninflammatory): Secondary | ICD-10-CM

## 2023-03-12 NOTE — Telephone Encounter (Signed)
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 Eden Emms read your cardiac MRI.  Luckily, there was nothing that suggested amyloidosis.  You did have some thickness of the septum of your heart.  Normal heart pump function.  Normal cardiac output.  You did have a circumferential amount of fluid around your heart.  Have you been experiencing any significant shortness of breath or pressure?  Let me know if you have questions.  Sharlene Dory, PA-C

## 2023-03-12 NOTE — Telephone Encounter (Signed)
-----   Message from Charlton Haws sent at 03/12/2023  1:30 PM EDT ----- Still needs those labs for non TTR amyloid and f/u echo for effusion 6-8 weeks ----- Message ----- From: Bunnie Domino Sent: 03/12/2023   1:06 PM EDT To: Wendall Stade, MD; [address] Triage  [name],   Dr. Eden Emms read your cardiac MRI.  Luckily, there was nothing that suggested amyloidosis.  You did have some thickness of the septum of your heart.  Normal heart pump function.  Normal cardiac output.  You did have a circumferential amount of fluid around your heart.  Have you been experiencing any significant shortness of breath or pressure?  Let me know if you have questions.  Sharlene Dory, PA-C

## 2023-03-12 NOTE — Telephone Encounter (Signed)
Call to patient to explain Dr. Eden Emms there was nothing that suggested amyloidosis., and she has normal pumping function and normal cardiac output. Patient verbalizes understanding that she did have some thickness of the septum of her heart. You did have a circumferential amount of fluid around your heart. Patient denies experiencing any significant shortness of breath or pressure. Forwarded to Dr. Fabio Bering team as patient does nto have her calendar with her to set up non TTR amyloid and f/u echo for effusion 6-8 weeks.

## 2023-03-13 NOTE — Telephone Encounter (Signed)
Placed order for lab work and echo. Will await for patient to call back to schedule .

## 2023-03-13 NOTE — Addendum Note (Signed)
Addended by: Virl Axe, Faylynn Stamos L on: 03/13/2023 12:46 PM   Modules accepted: Orders

## 2023-03-14 ENCOUNTER — Other Ambulatory Visit: Payer: Self-pay | Admitting: Family Medicine

## 2023-03-14 DIAGNOSIS — J3089 Other allergic rhinitis: Secondary | ICD-10-CM

## 2023-03-14 DIAGNOSIS — J4540 Moderate persistent asthma, uncomplicated: Secondary | ICD-10-CM

## 2023-03-17 ENCOUNTER — Encounter (INDEPENDENT_AMBULATORY_CARE_PROVIDER_SITE_OTHER): Payer: Self-pay | Admitting: Internal Medicine

## 2023-03-17 ENCOUNTER — Ambulatory Visit (INDEPENDENT_AMBULATORY_CARE_PROVIDER_SITE_OTHER): Payer: Medicare PPO | Admitting: Internal Medicine

## 2023-03-17 VITALS — BP 181/103 | HR 76 | Temp 98.4°F | Ht 59.0 in | Wt 220.0 lb

## 2023-03-17 DIAGNOSIS — R5383 Other fatigue: Secondary | ICD-10-CM

## 2023-03-17 DIAGNOSIS — Z6841 Body Mass Index (BMI) 40.0 and over, adult: Secondary | ICD-10-CM | POA: Diagnosis not present

## 2023-03-17 DIAGNOSIS — R0602 Shortness of breath: Secondary | ICD-10-CM

## 2023-03-17 DIAGNOSIS — R29818 Other symptoms and signs involving the nervous system: Secondary | ICD-10-CM | POA: Diagnosis not present

## 2023-03-17 DIAGNOSIS — E1159 Type 2 diabetes mellitus with other circulatory complications: Secondary | ICD-10-CM | POA: Diagnosis not present

## 2023-03-17 DIAGNOSIS — I5032 Chronic diastolic (congestive) heart failure: Secondary | ICD-10-CM

## 2023-03-17 DIAGNOSIS — E1169 Type 2 diabetes mellitus with other specified complication: Secondary | ICD-10-CM

## 2023-03-17 DIAGNOSIS — E669 Obesity, unspecified: Secondary | ICD-10-CM | POA: Diagnosis not present

## 2023-03-17 DIAGNOSIS — I152 Hypertension secondary to endocrine disorders: Secondary | ICD-10-CM

## 2023-03-17 DIAGNOSIS — Z1331 Encounter for screening for depression: Secondary | ICD-10-CM | POA: Diagnosis not present

## 2023-03-17 MED ORDER — AMLODIPINE BESYLATE 2.5 MG PO TABS
2.5000 mg | ORAL_TABLET | Freq: Every day | ORAL | 0 refills | Status: DC
Start: 2023-03-17 — End: 2023-06-15

## 2023-03-17 NOTE — Assessment & Plan Note (Signed)
Peak Weight: 232 Starting Weight : 220  Working BMR: 1406 calculated, IC 1771 which may be overestimating Weight loss goal: 10 to 15% in 9 to 12 months Nutritional: Category 2, has need for nutritional education Past Pharmacotherapy: None Current Pharmacotherapy: None Contributing factors: Disruption of circadian rhythm, Nutritional, Medications, Reduced physical activity, Eating patterns, and Menopause

## 2023-03-17 NOTE — Assessment & Plan Note (Signed)
Reviewed echocardiogram from 2022.  She has LVH with grade 1 diastolic dysfunction.  She has an elevated BNP.  She is followed by cardiology and is currently on Entresto, Bystolic and as needed use of torsemide.  She lacks self-monitoring.  Her blood pressure is markedly elevated.  I also reviewed her vital signs flow sheet which shows uncontrolled blood pressure.  Patient counseled on adherence, self-monitoring, maintain a diet low in sodium.  She was also advised to discontinue caffeinated beverages have been drinking a lot of green tea.  She will schedule a follow-up with her cardiologist or primary care team in the next 2 to 3 days.  She was started on amlodipine 2.5 mg today.

## 2023-03-17 NOTE — Progress Notes (Signed)
Chief Complaint:   OBESITY Beth Macias (MR# [id number]) is a 72 y.o. female who presents for evaluation and treatment of obesity and related comorbidities. Current BMI is Body mass index is 44.43 kg/m. Beth Macias has been struggling with her weight for many years and has been unsuccessful in either losing weight, maintaining weight loss, or reaching her healthy weight goal.  Beth Macias is currently in the action stage of change and ready to dedicate time achieving and maintaining a healthier weight. Beth Macias is interested in becoming our patient and working on intensive lifestyle modifications including (but not limited to) diet and exercise for weight loss.  Beth Macias's habits were reviewed today and are as follows: Her family eats meals together, she thinks her family will eat healthier with her, her desired weight loss is 75 lbs, she started gaining excessive weight after her (3) c-sections, her heaviest weight ever was 225 pounds, she has significant food cravings issues, she snacks frequently in the evenings, she skips meals frequently, she is frequently drinking liquids with calories, she frequently makes poor food choices, she frequently eats larger portions than normal, and she struggles with emotional eating.  Depression Screen Beth Macias's Food and Mood (modified PHQ-9) score was 6.  Subjective:   1. Other fatigue Beth Macias admits to daytime somnolence and admits to waking up still tired. Patient has a history of symptoms of daytime fatigue and morning fatigue. Beth Macias generally gets 5 or 7 hours of sleep per night, and states that she has nightime awakenings. Snoring is present. Apneic episodes are not present. Epworth Sleepiness Score is 6.   2. SOB (shortness of breath) on exertion Curley notes increasing shortness of breath with exercising and seems to be worsening over time with weight gain. She notes getting out of breath sooner with activity than she used to. This has  not gotten worse recently. Deanette denies shortness of breath at rest or orthopnea.  3. Type 2 diabetes mellitus with other specified complication, without long-term current use of insulin (HCC) HgbA1c is at goal for age and comorbid conditions.  Reviewed medication list I do not see any medications for diabetes.  Allergy list suggest an intolerance to SGLT2 drugs.  Not sure if incretin therapy is cost prohibitive and may be beneficial considering comorbid conditions.  Lab Results  Component Value Date   HGBA1C 6.8 (H) 08/01/2022   HGBA1C 6.3 02/07/2022   HGBA1C 6.8 (H) 07/12/2021   Lab Results  Component Value Date   MICROALBUR 12.4 (H) 08/01/2022   LDLCALC 133 (H) 08/01/2022   CREATININE 0.78 03/04/2023   4. Chronic heart failure with preserved ejection fraction (HCC) Reviewed echocardiogram from 2022.  She has LVH with grade 1 diastolic dysfunction.  She has an elevated BNP.  She is followed by cardiology and is currently on Entresto, Bystolic and as needed use of torsemide.  She lacks self-monitoring.  Her blood pressure is markedly elevated.  I also reviewed her vital signs flow sheet which shows uncontrolled blood pressure.  5. Hypertension associated with type 2 diabetes mellitus (HCC) Reviewed echocardiogram from 2022.  She has LVH with grade 1 diastolic dysfunction.  She has an elevated BNP.  She is followed by cardiology and is currently on Entresto, Bystolic and as needed use of torsemide.  She lacks self-monitoring.  Her blood pressure is markedly elevated.  I also reviewed her vital signs flow sheet which shows uncontrolled blood pressure.  6. Suspected sleep apnea She has an Epworth of 6 and high  risk phenotype.  She also has difficulty controlled hypertension with diastolic heart failure.  Assessment/Plan:   1. Other fatigue Beth Macias does feel that her weight is causing her energy to be lower than it should be. Fatigue may be related to obesity, depression or many other  causes. Labs will be ordered, and in the meanwhile, Beth Macias will focus on self care including making healthy food choices, increasing physical activity and focusing on stress reduction.  - EKG 12-Lead  2. SOB (shortness of breath) on exertion [name] does feel that she gets out of breath more easily that she used to when she exercises. Beth Macias's shortness of breath appears to be obesity related and exercise induced. She has agreed to work on weight loss and gradually increase exercise to treat her exercise induced shortness of breath. Will continue to monitor closely.  3. Type 2 diabetes mellitus with other specified complication, without long-term current use of insulin (HCC) Counseled on goals of care, monitoring for complications and importance of staying updated on immunizations and diabetes preventive measures. Continue with reduced calorie meal plan low on processed carbs and simple sugars. Ongoing weight loss will improve insulin resistance and glycemic control.  She may also be a candidate for GLP-1 therapy.  Continue with nutritional and behavioral strategies.  A significant amount of time was spent today educating patient on macronutrients and differences between simple and complex carbs.  - Vitamin B12 - Hemoglobin A1c - Insulin, random - Lipid Panel With LDL/HDL Ratio  4. Chronic heart failure with preserved ejection fraction (HCC) Patient counseled on adherence, self-monitoring, maintain a diet low in sodium.  She was also advised to discontinue caffeinated beverages have been drinking a lot of green tea.  She will schedule a follow-up with her cardiologist or primary care team in the next 2 to 3 days.  She was started on amlodipine 2.5 mg today.  5. Hypertension associated with type 2 diabetes mellitus (HCC) Patient counseled on adherence, self-monitoring, maintain a diet low in sodium.  She was also advised to discontinue caffeinated beverages have been drinking a lot of green  tea.  She will schedule a follow-up with her cardiologist or primary care team in the next 2 to 3 days.  She was started on amlodipine 2.5 mg today.  - amLODipine (NORVASC) 2.5 MG tablet; Take 1 tablet (2.5 mg total) by mouth daily.  Dispense: 30 tablet; Refill: 0  6. Suspected sleep apnea She would benefit from a sleep study.  We will complete screening at the next office visit.  7. Depression screen Mayerli had a positive depression screening. Depression is commonly associated with obesity and often results in emotional eating behaviors. We will monitor this closely and work on CBT to help improve the non-hunger eating patterns. Referral to Psychology may be required if no improvement is seen as she continues in our clinic.  8. BMI 40.0-44.9, adult (HCC)  9. Generalized obesity starting BMI  44.5 Peak Weight: 232 Starting Weight : 220  Working BMR: 1406 calculated, IC 1771 which may be overestimating Weight loss goal: 10 to 15% in 9 to 12 months Nutritional: Category 2, has need for nutritional education Past Pharmacotherapy: None Current Pharmacotherapy: None Contributing factors: Disruption of circadian rhythm, Nutritional, Medications, Reduced physical activity, Eating patterns, and Menopause  - VITAMIN D 25 Hydroxy (Vit-D Deficiency, Fractures)  Bert is currently in the action stage of change and her goal is to continue with weight loss efforts. I recommend Bushra begin the structured treatment plan as  follows:  She has agreed to the Category 2 Plan.  Exercise goals: All adults should avoid inactivity. Some physical activity is better than none, and adults who participate in any amount of physical activity gain some health benefits.   Behavioral modification strategies: increasing lean protein intake, decreasing simple carbohydrates, increasing vegetables, increasing water intake, increasing high fiber foods, meal planning and cooking strategies, and planning for  success.  She was informed of the importance of frequent follow-up visits to maximize her success with intensive lifestyle modifications for her multiple health conditions. She was informed we would discuss her lab results at her next visit unless there is a critical issue that needs to be addressed sooner. Adaeze agreed to keep her next visit at the agreed upon time to discuss these results.  Objective:   Blood pressure (!) 181/103, pulse 76, temperature 98.4 F (36.9 C), height 4\' 11"  (1.499 m), weight 220 lb (99.8 kg), SpO2 95%. Body mass index is 44.43 kg/m.  EKG: Normal sinus rhythm, rate 74 BPM.  Indirect Calorimeter completed today shows a VO2 of 256 and a REE of 1771.  Her calculated basal metabolic rate is 9833 thus her basal metabolic rate is better than expected.  General: Cooperative, alert, well developed, in no acute distress. HEENT: Conjunctivae and lids unremarkable. Cardiovascular: Regular rhythm.  Lungs: Normal work of breathing. Neurologic: No focal deficits.   Lab Results  Component Value Date   CREATININE 0.78 03/04/2023   BUN 15 03/04/2023   NA 145 (H) 03/04/2023   K 3.4 (L) 03/04/2023   CL 103 03/04/2023   CO2 27 03/04/2023   Lab Results  Component Value Date   ALT 12 08/01/2022   AST 15 08/01/2022   ALKPHOS 55 08/01/2022   BILITOT 0.8 08/01/2022   Lab Results  Component Value Date   HGBA1C 6.8 (H) 08/01/2022   HGBA1C 6.3 02/07/2022   HGBA1C 6.8 (H) 07/12/2021   HGBA1C 7.0 (H) 09/24/2020   HGBA1C 7.1 (H) 02/29/2020   No results found for: "INSULIN" Lab Results  Component Value Date   TSH 0.71 08/01/2022   Lab Results  Component Value Date   CHOL 223 (H) 08/01/2022   HDL 62.40 08/01/2022   LDLCALC 133 (H) 08/01/2022   TRIG 134.0 08/01/2022   CHOLHDL 4 08/01/2022   Lab Results  Component Value Date   WBC 6.3 08/01/2022   HGB 13.8 08/01/2022   HCT 38.2 03/04/2023   MCV 86.7 08/01/2022   PLT 257.0 08/01/2022   No results found for:  "IRON", "TIBC", "FERRITIN"  Attestation Statements:   Reviewed by clinician on day of visit: allergies, medications, problem list, medical history, surgical history, family history, social history, and previous encounter notes.  I have spent 60 minutes in the care of the patient today including: preparing to see patient (e.g. review and interpretation of tests, old notes ), obtaining and/or reviewing separately obtained history, performing a medically appropriate examination or evaluation, counseling and educating the patient, ordering medications, test or procedures, referring and communicating with other healthcare professionals, documenting clinical information in the electronic or other health care record, and independently interpreting results and communicating results to the patient, family, or caregiver.  Trude Mcburney, am acting as transcriptionist for Worthy Rancher, MD.  I have reviewed the above documentation for accuracy and completeness, and I agree with the above. -Worthy Rancher, MD

## 2023-03-17 NOTE — Assessment & Plan Note (Signed)
HgbA1c is at goal for age and comorbid conditions.  Reviewed medication list I do not see any medications for diabetes.  Allergy list suggest an intolerance to SGLT2 drugs.  Not sure if incretin therapy is cost prohibitive and may be beneficial considering comorbid conditions.  Counseled on goals of care, monitoring for complications and importance of staying updated on immunizations and diabetes preventive measures. Continue with reduced calorie meal plan low on processed carbs and simple sugars. Ongoing weight loss will improve insulin resistance and glycemic control  Lab Results  Component Value Date   HGBA1C 6.8 (H) 08/01/2022   HGBA1C 6.3 02/07/2022   HGBA1C 6.8 (H) 07/12/2021   Lab Results  Component Value Date   MICROALBUR 12.4 (H) 08/01/2022   LDLCALC 133 (H) 08/01/2022   CREATININE 0.78 03/04/2023   She may also be a candidate for GLP-1 therapy.  Continue with nutritional and behavioral strategies.  A significant amount of time was spent today educating patient on macronutrients and differences between simple and complex carbs.

## 2023-03-17 NOTE — Assessment & Plan Note (Signed)
She has an Epworth of 6 and high risk phenotype.  She also has difficulty controlled hypertension with diastolic heart failure.  She would benefit from a sleep study.  We will complete screening at the next office visit.

## 2023-03-18 LAB — VITAMIN B12: Vitamin B-12: >2000 pg/mL — ABNORMAL HIGH (ref 232–1245)

## 2023-03-18 LAB — LIPID PANEL WITH LDL/HDL RATIO
Cholesterol, Total: 243 mg/dL — ABNORMAL HIGH (ref 100–199)
HDL: 68 mg/dL (ref >39)
LDL Chol Calc (NIH): 149 mg/dL — ABNORMAL HIGH (ref 0–99)
LDL/HDL Ratio: 2.2 ratio (ref 0.0–3.2)
Triglycerides: 146 mg/dL (ref 0–149)
VLDL Cholesterol Cal: 26 mg/dL (ref 5–40)

## 2023-03-18 LAB — VITAMIN D 25 HYDROXY (VIT D DEFICIENCY, FRACTURES): Vit D, 25-Hydroxy: 58.4 ng/mL (ref 30.0–100.0)

## 2023-03-18 LAB — HEMOGLOBIN A1C
Est. average glucose Bld gHb Est-mCnc: 148 mg/dL
Hgb A1c MFr Bld: 6.8 % — ABNORMAL HIGH (ref 4.8–5.6)

## 2023-03-18 LAB — INSULIN, RANDOM: INSULIN: 19.7 u[IU]/mL (ref 2.6–24.9)

## 2023-03-25 ENCOUNTER — Encounter: Payer: Self-pay | Admitting: Nurse Practitioner

## 2023-03-25 ENCOUNTER — Ambulatory Visit (INDEPENDENT_AMBULATORY_CARE_PROVIDER_SITE_OTHER): Payer: Medicare PPO | Admitting: Family Medicine

## 2023-03-25 ENCOUNTER — Ambulatory Visit: Payer: Medicare PPO | Attending: Nurse Practitioner | Admitting: Nurse Practitioner

## 2023-03-25 VITALS — BP 148/62 | HR 56 | Resp 16 | Ht 59.0 in | Wt 220.0 lb

## 2023-03-25 DIAGNOSIS — I3139 Other pericardial effusion (noninflammatory): Secondary | ICD-10-CM

## 2023-03-25 DIAGNOSIS — E119 Type 2 diabetes mellitus without complications: Secondary | ICD-10-CM | POA: Diagnosis not present

## 2023-03-25 DIAGNOSIS — I10 Essential (primary) hypertension: Secondary | ICD-10-CM | POA: Diagnosis not present

## 2023-03-25 DIAGNOSIS — I517 Cardiomegaly: Secondary | ICD-10-CM | POA: Diagnosis not present

## 2023-03-25 DIAGNOSIS — I5022 Chronic systolic (congestive) heart failure: Secondary | ICD-10-CM | POA: Diagnosis not present

## 2023-03-25 MED ORDER — CARVEDILOL 25 MG PO TABS
25.0000 mg | ORAL_TABLET | Freq: Two times a day (BID) | ORAL | 3 refills | Status: DC
Start: 2023-03-25 — End: 2024-03-25

## 2023-03-25 NOTE — Patient Instructions (Addendum)
Medication Instructions:  Stop Nebivolol 20 mg as directed. Start Carvedilol 25 mg twice daily    *If you need a refill on your cardiac medications before your next appointment, please call your pharmacy*   Lab Work: NONE ordered at this time of appointment    Testing/Procedures: NONE ordered at this time of appointment     Follow-Up: At [facility], you and your health needs are our priority.  As part of our continuing mission to provide you with exceptional heart care, we have created designated Provider Care Teams.  These Care Teams include your primary Cardiologist (physician) and Advanced Practice Providers (APPs -  Physician Assistants and Nurse Practitioners) who all work together to provide you with the care you need, when you need it.  We recommend signing up for the patient portal called "MyChart".  Sign up information is provided on this After Visit Summary.  MyChart is used to connect with patients for Virtual Visits (Telemedicine).  Patients are able to view lab/test results, encounter notes, upcoming appointments, etc.  Non-urgent messages can be sent to your provider as well.   To learn more about what you can do with MyChart, go to [email or web address].    Your next appointment:   6 week(s)  Provider:   Charlton Haws, MD , APP, or Bernadene Person NP    Other Instructions Monitor blood pressure. Report BP consistently greater than 130/80 or systolic BP (top number) less than 110. Report heart rate consistently less than 50. Take BP 2 hours after BP mediation.

## 2023-03-25 NOTE — Progress Notes (Signed)
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  Past Medical History    Past Medical History:  Diagnosis Date   Anxiety    Asthma    Back pain    Blood in stool    Chewing difficulty    CHF (congestive heart failure) (HCC)    Colon polyps    Constipation    Diabetes mellitus without complication (HCC)    Diverticulitis    Fibromyalgia    GERD (gastroesophageal reflux disease)    Gout    Hypercholesteremia    Hypertension    Joint pain    Lactose intolerance    Pneumonia    Pneumonia    Prediabetes    SOB (shortness of breath)    UTI (urinary tract infection)    Vitamin B 12 deficiency    Vitamin D deficiency    Past Surgical History:  Procedure Laterality Date   APPENDECTOMY     CESAREAN SECTION     x 3   LIPOMA EXCISION     MENISCUS REPAIR Left    OVARIAN CYST REMOVAL     OVARIAN CYST SURGERY      Allergies  Allergies  Allergen Reactions   Strawberry (Diagnostic) Anaphylaxis and Other (See Comments)    hives   Farxiga [Dapagliflozin] Other (See Comments)    Had HAs, weakness, rhinorrhea, jitteriness, nasal congestion, nocturia, yeast infections, memory changes, irritability.   Penicillins Itching    Has patient had a PCN reaction causing immediate rash, facial/tongue/throat swelling, SOB or lightheadedness with hypotension: No Has patient had a PCN reaction causing severe rash involving mucus membranes or skin necrosis: No Has patient had a PCN reaction that required hospitalization: Unknown Has patient had a PCN reaction occurring  within the last 10 years: No If all of the above answers are "NO", then may proceed with Cephalosporin use.   Gabapentin Itching, Rash and Other (See Comments)    Incoherent   Tape Other (See Comments)    Blisters     Labs/Other Studies Reviewed    The following studies were reviewed today:  Cardiac Studies & Procedures     STRESS TESTS  MYOCARDIAL PERFUSION IMAGING 12/13/2018  Interpretation Summary  Nuclear stress EF: 32%. The left ventricular ejection fraction is moderately decreased (30-44%).  Blood pressure demonstrated a normal response to exercise.  This is a high risk study based on the severe LV dysfunction.  There is no evidence of ischemia and no evidence of previous infarction   ECHOCARDIOGRAM  ECHOCARDIOGRAM COMPLETE 02/25/2021  Narrative ECHOCARDIOGRAM REPORT    Patient Name:   Beth Macias Date of Exam: 02/25/2021 Medical Rec #:  [id number]          Height:       61.0 in Accession #:    [id number]         Weight:       224.2 lb Date of Birth:  15-Feb-1951          BSA:          1.983 m Patient Age:    69 years  BP:           154/82 mmHg Patient Gender: F                  HR:           71 bpm. Exam Location:  [address]  Procedure: 2D Echo, 3D Echo, Cardiac Doppler and Color Doppler  Indications:    I42.0 Dilated Cardiomyopathy  History:        Patient has prior history of Echocardiogram examinations, most recent 10/28/2020. CHF, Signs/Symptoms:Edema; Risk Factors:Hypertension, Diabetes, Dyslipidemia, Former Smoker and Family History of Coronary Artery Disease. Dilated Cardiomyopathy, History of Pericardial Effusion.  Sonographer:    Farrel Conners RDCS Referring Phys: Wendall Stade  IMPRESSIONS   1. Given effuson and "speckled" pattern on LV consider f/u MRI to r/o amyloid . Left ventricular ejection fraction, by estimation, is 60 to 65%. The left ventricle has normal function. The left ventricle has no regional wall motion  abnormalities. There is moderate left ventricular hypertrophy. Left ventricular diastolic parameters are consistent with Grade I diastolic dysfunction (impaired relaxation). 2. Right ventricular systolic function is normal. The right ventricular size is normal. 3. Left atrial size was mildly dilated. 4. Small to moderate. 5. The mitral valve is normal in structure. No evidence of mitral valve regurgitation. No evidence of mitral stenosis. 6. The aortic valve is tricuspid. Aortic valve regurgitation is not visualized. Mild to moderate aortic valve sclerosis/calcification is present, without any evidence of aortic stenosis. 7. The inferior vena cava is normal in size with greater than 50% respiratory variability, suggesting right atrial pressure of 3 mmHg.  FINDINGS Left Ventricle: Given effuson and "speckled" pattern on LV consider f/u MRI to r/o amyloid. Left ventricular ejection fraction, by estimation, is 60 to 65%. The left ventricle has normal function. The left ventricle has no regional wall motion abnormalities. The left ventricular internal cavity size was normal in size. There is moderate left ventricular hypertrophy. Left ventricular diastolic parameters are consistent with Grade I diastolic dysfunction (impaired relaxation).  Right Ventricle: The right ventricular size is normal. No increase in right ventricular wall thickness. Right ventricular systolic function is normal.  Left Atrium: Left atrial size was mildly dilated.  Right Atrium: Right atrial size was normal in size.  Pericardium: No change in effusion since 10/28/20 Largest posterior to LV. No tamponade. Small to moderate.  Mitral Valve: The mitral valve is normal in structure. No evidence of mitral valve regurgitation. No evidence of mitral valve stenosis.  Tricuspid Valve: The tricuspid valve is normal in structure. Tricuspid valve regurgitation is mild . No evidence of tricuspid stenosis.  Aortic Valve: The aortic valve  is tricuspid. Aortic valve regurgitation is not visualized. Mild to moderate aortic valve sclerosis/calcification is present, without any evidence of aortic stenosis.  Pulmonic Valve: The pulmonic valve was normal in structure. Pulmonic valve regurgitation is not visualized. No evidence of pulmonic stenosis.  Aorta: The aortic root is normal in size and structure.  Venous: The inferior vena cava is normal in size with greater than 50% respiratory variability, suggesting right atrial pressure of 3 mmHg.  IAS/Shunts: No atrial level shunt detected by color flow Doppler.  Additional Comments: Mild ascites is present.   LEFT VENTRICLE PLAX 2D LVIDd:         4.30 cm  Diastology LVIDs:         2.90 cm  LV e' medial:    7.40 cm/s LV PW:  1.70 cm  LV E/e' medial:  6.7 LV IVS:        1.30 cm  LV e' lateral:   11.10 cm/s LVOT diam:     2.40 cm  LV E/e' lateral: 4.5 LV SV:         80 LV SV Index:   40 LVOT Area:     4.52 cm  3D Volume EF: 3D EF:        62 % LV EDV:       173 ml LV ESV:       65 ml LV SV:        108 ml  RIGHT VENTRICLE RV S prime:     18.60 cm/s TAPSE (M-mode): 2.6 cm  LEFT ATRIUM           Index       RIGHT ATRIUM           Index LA diam:      4.20 cm 2.12 cm/m  RA Area:     19.60 cm LA Vol (A4C): 85.8 ml 43.28 ml/m RA Volume:   57.10 ml  28.80 ml/m AORTIC VALVE LVOT Vmax:   99.75 cm/s LVOT Vmean:  60.300 cm/s LVOT VTI:    0.176 m  AORTA Ao Root diam: 3.00 cm Ao Asc diam:  3.30 cm  MITRAL VALVE MV Area (PHT): cm         SHUNTS MV Decel Time: 225 msec    Systemic VTI:  0.18 m MV E velocity: 49.75 cm/s  Systemic Diam: 2.40 cm MV A velocity: 78.60 cm/s MV E/A ratio:  0.63  Charlton Haws MD Electronically signed by Charlton Haws MD Signature Date/Time: 02/25/2021/4:43:42 PM    Final      CARDIAC MRI  MR CARDIAC MORPHOLOGY W WO CONTRAST 03/11/2023  Narrative CLINICAL DATA:  R/O Amyloid  EXAM: CARDIAC MRI  TECHNIQUE: The patient was  scanned on a 1.5 Tesla Siemens magnet. A dedicated cardiac coil was used. Functional imaging was done using Fiesta sequences. 2,3, and 4 chamber views were done to assess for RWMA's. Modified Simpson's rule using a short axis stack was used to calculate an ejection fraction on a dedicated work [employer]. The patient received 10 cc of Gadavist. After 10 minutes inversion recovery sequences were used to assess for infiltration and scar tissue.  CONTRAST:  10mL GADAVIST GADOBUTROL 1 MMOL/ML IV SOLN  FINDINGS: Mild LAE normal RA No LAA thrombus. No ASD/PFO. Normal ascending thoracic aorta 3.4 cm. Tri leaflet AV with no AR/AS  Mildly thickened MV with trivial appearing MR. Large circumferential pericardial effusion with no evidence of tamponade on free breathing sequences.  There is moderate LVH with septal thickness 14 mm. No RWMA;s and low normal systolic LV function. There is normal nulling of the myocardium prior to the LV cavity on post gadolinium images. There is no significant late gadolinium uptake to suggest amyloid or infiltrative cardiomyopathy.  Quantitative LVEF 51% (EDV 172 cc ESV 85 cc SV 88 cc) Estimated cardiac output 4.5 L/min  Quantitative RVEF 54% (EDV 116 cc ESV 53 cc SV 63 cc )  Parametric Measures: Using Hct of 38  T1: Upper normal 1055 msec  ECV: Minimally elevated 31%  T2: Normal 45 msec  IMPRESSION: 1.  Moderate LVH septal thickness 14 mm Low normal LVEF 51%  2.  Normal RVEF 54%  3.  Normal myocardial nulling with no late gadolinium uptake  4.  Estimated cardiac output 4.5 L/min  5. Normal T1/T2 with minimally elevated ECV not consistent with amyloid  6. Large circumferential pericardial effusion with no tamponade physiology noted on free breathing sequences  Charlton Haws   Electronically Signed By: Charlton Haws M.D. On: 03/11/2023 17:06   PYP SCAN  MYOCARDIAL AMYLOID PLANAR AND SPECT 01/22/2023  Interpretation  Summary   Myocardial uptake was positive for radiotracer uptake. The visual grade of myocardial uptake relative to the ribs was Grade 1 (Myocardial uptake less than rib uptake).   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   Prior study not available for comparison.   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  Echocardiogram in June 2020 revealed EF 30 to 35%, moderate MR, small to moderate size pericardial effusion.  Myoview in July 2020 showed normal perfusion, LVEF 32%.  She was started on GDMT.  Echocardiogram in 2021 showed EF improved to 50 to 55%, moderate pericardial effusion with no evidence of tamponade, trivial MR.  Echocardiogram in 02/2021 showed EF 60 to 65%, moderate LVH with speckling.  She was last seen in the office on 01/12/2023 and was stable from a cardiac standpoint. Given concern for amyloid pattern on prior echocardiogram, she underwent PYP scan which revealed positive uptake.  Follow-up cardiac MRI showed moderate LVH, EF 51%, no late gadolinium uptake, not consistent with amyloid.  There was evidence of a large circumferential pericardial effusion with no signs of tamponade.  Repeat echocardiogram was scheduled for 04/16/2023.  [facility]  [facility]. Her BP was elevated.  She was started on amlodipine 2.5 mg daily and advised to follow-up with cardiology.  She presents today for follow-up.  Since her last visit she has been stable overall from a cardiac standpoint.  Her BP remains elevated.  She notes some mild dyspnea on exertion, overall stable, she has nonpitting bilateral lower extremity edema, well-controlled on torsemide.  Other than her ongoing elevated BP, she denies any additional concerns today.  Home Medications    Current Outpatient Medications  Medication Sig Dispense Refill   albuterol (VENTOLIN HFA) 108 (90 Base) MCG/ACT inhaler Inhale 2 puffs into the lungs every 4 (four) hours as needed for wheezing or shortness of breath. 8.5 each 5   allopurinol (ZYLOPRIM) 100 MG tablet TAKE 1 TABLET(100 MG) BY MOUTH DAILY 30 tablet 6   amLODipine (NORVASC) 2.5 MG tablet Take 1 tablet (2.5 mg total) by mouth daily. 30 tablet 0   blood glucose meter kit and supplies KIT Dispense based on patient and insurance preference. Use up to four times daily as directed. 1 each 0   Blood Pressure Monitoring (BLOOD PRESSURE CUFF) MISC For daily monitoring as directed. 1 each 0   Calcium-Magnesium-Vitamin D (CALCIUM 1200+D3 PO) Take 600 mg by mouth  daily.     carvedilol (COREG) 25 MG tablet Take 1 tablet (25 mg total) by mouth 2 (two) times daily. 180 tablet 3   cholecalciferol (VITAMIN D) 25 MCG (1000 UNIT) tablet Take 1,000 Units by mouth at bedtime.      Cyanocobalamin ([employer] B12 GUMMIES PO) Take 1,000 mcg by mouth daily.     famotidine (PEPCID) 40 MG tablet Take 1 tablet (40 mg total) by mouth in the morning. 90 tablet 3   fluticasone (FLONASE) 50 MCG/ACT nasal spray Place 1 spray into both nostrils daily. 16 g 6   ipratropium-albuterol (DUONEB) 0.5-2.5 (3) MG/3ML SOLN Take 3 mLs by nebulization every 6 (six) hours as needed (shortness of breath/wheezing).     levocetirizine (XYZAL) 5 MG tablet Take 1 tablet (5 mg total) by mouth every  evening. 90 tablet 1   montelukast (SINGULAIR) 10 MG tablet TAKE 1 TABLET EVERY DAY 90 tablet 3   ONETOUCH ULTRA test strip USE TO TEST BLOOD SUGAR UP TO FOUR TIMES DAILY 100 strip 3   potassium chloride 20 MEQ/15ML (10%) SOLN Take 15 mLs (20 mEq total) by mouth daily as needed (with each dose of torsemide). (Patient taking differently: Take 20 mEq by mouth as directed.) 473 mL 0   sacubitril-valsartan (ENTRESTO) 49-51 MG Take 1 tablet by mouth 2 (two) times daily. 180 tablet 3   torsemide (DEMADEX) 20 MG tablet Take 1 tablet (20 mg total) by mouth daily as needed (swelling/fluid/weight gain). (Patient taking differently: Take 20 mg by mouth as directed.) 90 tablet 1   vitamin C (ASCORBIC ACID) 500 MG tablet Take 500 mg by mouth daily.     No current facility-administered medications for this visit.     Review of Systems    She denies chest pain, palpitations, pnd, orthopnea, n, v, dizziness, syncope, weight gain, or early satiety. All other systems reviewed and are otherwise negative except as noted above.   Physical Exam    VS:  BP (!) 148/62   Pulse (!) 56   Resp 16   Ht 4\' 11"  (1.499 m)   Wt 220 lb (99.8 kg)   SpO2 96%   BMI 44.43 kg/m   GEN: Well nourished, well developed, in no acute distress. HEENT: normal. Neck: Supple, no JVD, carotid bruits, or masses. Cardiac: RRR, no murmurs, rubs, or gallops. No clubbing, cyanosis, edema.  Radials/DP/PT 2+ and equal bilaterally.  Respiratory:  Respirations regular and unlabored, clear to auscultation bilaterally. GI: Soft, nontender, nondistended, BS + x 4. MS: no deformity or atrophy. Skin: warm and dry, no rash. Neuro:  Strength and sensation are intact. Psych: Normal affect.  Accessory Clinical Findings    ECG personally reviewed by me today -    - no EKG in office today.    Lab Results  Component Value Date   WBC 6.3 08/01/2022   HGB 13.8 08/01/2022   HCT 38.2 03/04/2023   MCV 86.7 08/01/2022   PLT 257.0 08/01/2022    Lab Results  Component Value Date   CREATININE 0.78 03/04/2023   BUN 15 03/04/2023   NA 145 (H) 03/04/2023   K 3.4 (L) 03/04/2023   CL 103 03/04/2023   CO2 27 03/04/2023   Lab Results  Component Value Date   ALT 12 08/01/2022   AST 15 08/01/2022   ALKPHOS 55 08/01/2022   BILITOT 0.8 08/01/2022   Lab Results  Component Value Date   CHOL 243 (H) 03/17/2023   HDL 68 03/17/2023   LDLCALC  149 (H) 03/17/2023   TRIG 146 03/17/2023   CHOLHDL 4 08/01/2022    Lab Results  Component Value Date   HGBA1C 6.8 (H) 03/17/2023    Assessment & Plan    1. Hypertension: BP remains elevated in office today, somewhat improved with recheck.  Patient notes she has been under significant amount of stress.  In discussing options for blood pressure management, she notes she did not tolerate a higher dose of Entresto is not interested in attempting to increase Entresto dose at this time.  She felt that her BP was better controlled on carvedilol, she states that this was switched by her PCP to nebivolol with concern for asthma/shortness of breath with seasonal allergies.  She would like to try resuming her prior dose of carvedilol 25 mg twice daily and discontinue nebivolol at this time.  Will stop nebivolol.  Will start carvedilol 25 mg twice daily.  Continue to monitor BP report to be consistently greater than 140/80.  Continue low-dose amlodipine, Entresto.  If concern for bradycardia or hypotension, consider reducing carvedilol dose versus discontinuing amlodipine.  2. Pericardial effusion: Cardiac MRI in 02/2023 showed evidence of a large circumferential pericardial effusion with no signs of tamponade.  She notes stable mild dyspnea on exertion, denies chest pain.  Repeat echocardiogram is scheduled for 04/16/2023.  3. LVH: Given concern for amyloid pattern on prior echocardiogram, she underwent PYP scan which revealed positive uptake.  Follow-up cardiac MRI showed moderate LVH, EF 51%, no late  gadolinium uptake, not consistent with amyloid.    4. Chronic systolic heart failure with improved EF: Prior EF 30 to 35%. Most recent echocardiogram in 02/2021 showed EF 60 to 65%, moderate LVH with speckling.  She has stable mild dyspnea on exertion, stable nonpitting bilateral lower extremity edema, well-controlled with as needed torsemide.  Generally euvolemic, compensated on exam.  Continue prn torsemide, current medications as above.  5. Hyperlipidemia: LDL was 149 in 02/2023.  She is not on statin therapy at this time.  Monitored and managed per PCP.  6. Type 2 diabetes/obesity: A1c was 6.8 in 02/2023.  Monitored and managed per PCP.  [facility].  Encouraged ongoing lifestyle modifications.  7. Disposition: Follow-up in 6 weeks.  HYPERTENSION CONTROL Vitals:   03/25/23 1336 03/25/23 1415  BP: (!) 182/90 (!) 148/62    The patient's blood pressure is elevated above target today.  In order to address the patient's elevated BP: Blood pressure will be monitored at home to determine if medication changes need to be made.; A new medication was prescribed today.; Follow up with general cardiology has been recommended.      Joylene Grapes, NP 03/25/2023, 2:59 PM

## 2023-03-30 ENCOUNTER — Telehealth: Payer: Self-pay

## 2023-03-30 NOTE — Telephone Encounter (Signed)
Called patient to get her scheduled per Dr. Salomon Fick

## 2023-03-31 ENCOUNTER — Ambulatory Visit (INDEPENDENT_AMBULATORY_CARE_PROVIDER_SITE_OTHER): Payer: Medicare PPO | Admitting: Family Medicine

## 2023-03-31 ENCOUNTER — Encounter (INDEPENDENT_AMBULATORY_CARE_PROVIDER_SITE_OTHER): Payer: Self-pay | Admitting: Internal Medicine

## 2023-03-31 ENCOUNTER — Ambulatory Visit (INDEPENDENT_AMBULATORY_CARE_PROVIDER_SITE_OTHER): Payer: Medicare PPO | Admitting: Internal Medicine

## 2023-03-31 VITALS — BP 153/99 | HR 67 | Temp 98.0°F | Ht 59.0 in | Wt 216.0 lb

## 2023-03-31 DIAGNOSIS — E1169 Type 2 diabetes mellitus with other specified complication: Secondary | ICD-10-CM | POA: Diagnosis not present

## 2023-03-31 DIAGNOSIS — Z6841 Body Mass Index (BMI) 40.0 and over, adult: Secondary | ICD-10-CM

## 2023-03-31 DIAGNOSIS — R29818 Other symptoms and signs involving the nervous system: Secondary | ICD-10-CM

## 2023-03-31 DIAGNOSIS — E785 Hyperlipidemia, unspecified: Secondary | ICD-10-CM | POA: Diagnosis not present

## 2023-03-31 DIAGNOSIS — Z9189 Other specified personal risk factors, not elsewhere classified: Secondary | ICD-10-CM | POA: Diagnosis not present

## 2023-03-31 DIAGNOSIS — E669 Obesity, unspecified: Secondary | ICD-10-CM | POA: Diagnosis not present

## 2023-03-31 DIAGNOSIS — Z5320 Procedure and treatment not carried out because of patient's decision for unspecified reasons: Secondary | ICD-10-CM | POA: Insufficient documentation

## 2023-03-31 NOTE — Progress Notes (Signed)
Office: [phone]  /  Fax: [phone]  WEIGHT SUMMARY AND BIOMETRICS  Vitals Temp: 98 F (36.7 C) BP: (!) 153/99 Pulse Rate: 67 SpO2: 97 %   Anthropometric Measurements Height: 4\' 11"  (1.499 m) Weight: 216 lb (98 kg) BMI (Calculated): 43.6 Weight at Last Visit: 220 lb Weight Lost Since Last Visit: 4 lb Weight Gained Since Last Visit: 0 lb Starting Weight: 220 lb Total Weight Loss (lbs): 4 lb (1.814 kg) Peak Weight: 230 lb   Body Composition  Body Fat %: 56.9 % Fat Mass (lbs): 123.4 lbs Muscle Mass (lbs): 88.6 lbs Total Body Water (lbs): 0 lbs Visceral Fat Rating : 21    RMR: 1771  Today's Visit #: 2  Starting Date: 03/17/23  The 10-year ASCVD risk score (Arnett DK, et al., 2019) is: 46.1%   HPI  Chief Complaint: OBESITY  Beth Macias is here to discuss her progress with her obesity treatment plan. She is on the the Category 2 Plan and states she is following her eating plan approximately 75 % of the time. She states she is exercising 60 minutes 2-3 times per week.  Interval History:   .Discussed the use of AI scribe software for clinical note transcription with the patient, who gave verbal consent to proceed.  History of Present Illness    The patient, a 72 year old female with a history of diabetes, hypertension, and hyperlipidemia, presents for weight management. She reports a recent weight loss of four pounds, achieved through dietary modifications. However, she admits to occasional dietary lapses, particularly on weekends. The patient expresses a desire to continue managing her health through dietary changes, expressing a reluctance to add more medications to her regimen.  The patient has a family history of cardiovascular disease, with both parents having suffered from hypertension and succumbing to congestive heart failure. She also mentions a brother who experienced a heart attack. Despite this, the patient remains hesitant about starting cholesterol  medications, expressing a desire to discuss this with her family and primary care provider first.  The patient has been making efforts to increase her physical activity, including swimming and using weights in the pool. She has been incorporating more fruits, vegetables, and lean proteins into her meals, and has been avoiding fried foods and red meat due to her cholesterol levels.  The patient's blood pressure remains elevated despite recent medication adjustments by her primary care provider. The patient expresses some discomfort with the tightness of the blood pressure cuff during readings, which may be due to the cuff being too small.  The patient is up-to-date with her colonoscopy and mammogram, and believes she has had a bone density test for osteoporosis, but will need to confirm this with her primary care provider. She has not had a sleep study, but this will be discussed at her next appointment due to her history of heart failure and elevated blood pressure.      Orexigenic Control: Denies problems with appetite and hunger signals.  Denies problems with satiety and satiation.  Denies problems with eating patterns and portion control.  Denies abnormal cravings. Denies feeling deprived or restricted.   Barriers identified: none.   Pharmacotherapy for weight loss: She is currently taking no anti-obesity medication and patient has declined pharmacotherapy in past.    ASSESSMENT AND PLAN  TREATMENT PLAN FOR OBESITY:  Recommended Dietary Goals  [name] is currently in the action stage of change. As such, her goal is to continue weight management plan. She has agreed to: continue current plan  Behavioral Intervention  We discussed the following Behavioral Modification Strategies today: continue to work on maintaining a reduced calorie state, getting the recommended amount of protein, incorporating whole foods, making healthy choices, staying well hydrated and practicing mindfulness  when eating..  Additional resources provided today: None  Recommended Physical Activity Goals  [name] has been advised to work up to 150 minutes of moderate intensity aerobic activity a week and strengthening exercises 2-3 times per week for cardiovascular health, weight loss maintenance and preservation of muscle mass.   She has agreed to :  Think about enjoyable ways to increase daily physical activity and overcoming barriers to exercise and Increase physical activity in their day and reduce sedentary time (increase NEAT).  Pharmacotherapy We discussed various medication options to help [name] with her weight loss efforts and we both agreed to : continue with nutritional and behavioral strategies and has declined pharmacotherapy  ASSOCIATED CONDITIONS ADDRESSED TODAY  1. Statin medication declined by patient  2. Type 2 diabetes mellitus with obesity (HCC)  3. At high risk for cardiovascular disease  4. Hyperlipidemia associated with type 2 diabetes mellitus (HCC)  5. Generalized obesity starting BMI  44.5  6. Suspected sleep apnea    Plan: We reviewed most recent labs with patient.  She has an elevated cardiovascular risk of about 50%, we reviewed strategies to help her lower her risk.  At present time she declines statin therapy and initiation of diabetes medications.  She would like to discuss this further with her family and will like to try 6 months of nutritional changes.  She is currently working with her primary care team and adjusting her blood pressure medications.  Despite this her blood pressure remains uncontrolled.  She has been counseled on the risk associated with elevated blood pressure as she has a history of heart failure and family history of cardiovascular death.  Her hemoglobin A1c was 6.8.  I feel that she would be a good candidate for incretin therapy which she is not interested in.  As it pertains to her obesity she has done a good job implementing reduced  calorie nutrition plan has initiated weight loss process.  She will continue with medically supervised weight loss plan.  We discussed increasing volume of physical activity.  She may also benefit from incretin therapy.  Lastly she has symptoms of OSA we will do further screening at the next office visit.   PHYSICAL EXAM:  Blood pressure (!) 153/99, pulse 67, temperature 98 F (36.7 C), height 4\' 11"  (1.499 m), weight 216 lb (98 kg), SpO2 97%. Body mass index is 43.63 kg/m.  General: She is overweight, cooperative, alert, well developed, and in no acute distress. PSYCH: Has normal mood, affect and thought process.   HEENT: EOMI, sclerae are anicteric. Lungs: Normal breathing effort, no conversational dyspnea. Extremities: No edema.  Neurologic: No gross sensory or motor deficits. No tremors or fasciculations noted.    DIAGNOSTIC DATA REVIEWED:  BMET    Component Value Date/Time   NA 145 (H) 03/04/2023 1102   K 3.4 (L) 03/04/2023 1102   CL 103 03/04/2023 1102   CO2 27 03/04/2023 1102   GLUCOSE 119 (H) 03/04/2023 1102   GLUCOSE 103 (H) 08/01/2022 1037   BUN 15 03/04/2023 1102   CREATININE 0.78 03/04/2023 1102   CALCIUM 10.1 03/04/2023 1102   GFRNONAA 60 (L) 11/06/2020 1114   GFRAA 86 02/29/2020 1010   Lab Results  Component Value Date   HGBA1C 6.8 (H) 03/17/2023  HGBA1C 7.2 (H) 06/29/2019   Lab Results  Component Value Date   INSULIN 19.7 03/17/2023   Lab Results  Component Value Date   TSH 0.71 08/01/2022   CBC    Component Value Date/Time   WBC 6.3 08/01/2022 1037   RBC 4.81 08/01/2022 1037   HGB 13.8 08/01/2022 1037   HCT 38.2 03/04/2023 1102   PLT 257.0 08/01/2022 1037   MCV 86.7 08/01/2022 1037   MCH 28.0 10/29/2020 0021   MCHC 33.0 08/01/2022 1037   RDW 14.5 08/01/2022 1037   Iron Studies No results found for: "IRON", "TIBC", "FERRITIN", "IRONPCTSAT" Lipid Panel     Component Value Date/Time   CHOL 243 (H) 03/17/2023 1055   TRIG 146 03/17/2023  1055   HDL 68 03/17/2023 1055   CHOLHDL 4 08/01/2022 1037   VLDL 26.8 08/01/2022 1037   LDLCALC 149 (H) 03/17/2023 1055   Hepatic Function Panel     Component Value Date/Time   PROT 6.8 08/01/2022 1037   PROT 6.0 03/27/2021 1501   ALBUMIN 3.9 08/01/2022 1037   ALBUMIN 3.9 02/29/2020 1010   AST 15 08/01/2022 1037   ALT 12 08/01/2022 1037   ALKPHOS 55 08/01/2022 1037   BILITOT 0.8 08/01/2022 1037   BILITOT 0.7 02/29/2020 1010      Component Value Date/Time   TSH 0.71 08/01/2022 1037   Nutritional Lab Results  Component Value Date   VD25OH 58.4 03/17/2023     Return in about 3 weeks (around 04/21/2023) for For Weight Mangement with Dr. Rikki Spearing - 40 minutes, complex.Marland Kitchen She was informed of the importance of frequent follow up visits to maximize her success with intensive lifestyle modifications for her multiple health conditions.   ATTESTASTION STATEMENTS:  Reviewed by clinician on day of visit: allergies, medications, problem list, medical history, surgical history, family history, social history, and previous encounter notes.   I have spent 40 minutes in the care of the patient today including: preparing to see patient (e.g. review and interpretation of tests, old notes ), obtaining and/or reviewing separately obtained history, performing a medically appropriate examination or evaluation, counseling and educating the patient, documenting clinical information in the electronic or other health care record, and independently interpreting results and communicating results to the patient, family, or caregiver   Worthy Rancher, MD

## 2023-04-03 ENCOUNTER — Ambulatory Visit (INDEPENDENT_AMBULATORY_CARE_PROVIDER_SITE_OTHER): Payer: Medicare PPO | Admitting: Podiatry

## 2023-04-03 ENCOUNTER — Encounter: Payer: Self-pay | Admitting: Podiatry

## 2023-04-03 ENCOUNTER — Ambulatory Visit (INDEPENDENT_AMBULATORY_CARE_PROVIDER_SITE_OTHER): Payer: Medicare PPO

## 2023-04-03 DIAGNOSIS — M722 Plantar fascial fibromatosis: Secondary | ICD-10-CM | POA: Diagnosis not present

## 2023-04-03 MED ORDER — TRIAMCINOLONE ACETONIDE 10 MG/ML IJ SUSP
10.0000 mg | Freq: Once | INTRAMUSCULAR | Status: AC
Start: 2023-04-03 — End: 2023-04-03
  Administered 2023-04-03: 10 mg via INTRA_ARTICULAR

## 2023-04-05 NOTE — Progress Notes (Signed)
Subjective:   Patient ID: Beth Macias, female   DOB: 72 y.o.   MRN: [id number]   HPI Patient presents with pain in the left arch that is very tender and states that it makes it hard for her to walk on and she is not sure if it might be gout   ROS      Objective:  Physical Exam  Neurovascular status intact with the patient found to have significant depression of the arch of the left with inflammation fluid in the mid arch area left that is painful when pressed     Assessment:  Plantar fasciitis left with depression of the arch causing stress and pain in the area     Plan:  H&P reviewed went ahead today did sterile prep injected the mid arch area left 3 mg dexamethasone Kenalog 5 mg Xylocaine and applied fascial brace to lift up the arch properly fitted into the arch in order to take pressure off the plantar fascia pulling.  Patient will be seen back as symptoms indicate  X-rays indicate moderate depression of the arch no other indication of pathology

## 2023-04-13 ENCOUNTER — Other Ambulatory Visit (INDEPENDENT_AMBULATORY_CARE_PROVIDER_SITE_OTHER): Payer: Self-pay | Admitting: Internal Medicine

## 2023-04-13 ENCOUNTER — Other Ambulatory Visit: Payer: Self-pay | Admitting: Family Medicine

## 2023-04-13 DIAGNOSIS — I152 Hypertension secondary to endocrine disorders: Secondary | ICD-10-CM

## 2023-04-13 DIAGNOSIS — E876 Hypokalemia: Secondary | ICD-10-CM

## 2023-04-14 ENCOUNTER — Telehealth: Payer: Self-pay

## 2023-04-14 NOTE — Telephone Encounter (Signed)
Spoke with patient per Dr. Salomon Fick and got her sch

## 2023-04-16 ENCOUNTER — Ambulatory Visit (HOSPITAL_COMMUNITY): Payer: Medicare PPO | Attending: Cardiology

## 2023-04-16 DIAGNOSIS — I3139 Other pericardial effusion (noninflammatory): Secondary | ICD-10-CM | POA: Diagnosis not present

## 2023-04-16 LAB — ECHOCARDIOGRAM COMPLETE
Area-P 1/2: 3.08 cm2
S' Lateral: 2.9 cm

## 2023-04-17 ENCOUNTER — Encounter: Payer: Self-pay | Admitting: Family Medicine

## 2023-04-17 ENCOUNTER — Telehealth: Payer: Self-pay | Admitting: Cardiovascular Disease

## 2023-04-17 ENCOUNTER — Ambulatory Visit (INDEPENDENT_AMBULATORY_CARE_PROVIDER_SITE_OTHER): Payer: Medicare PPO | Admitting: Family Medicine

## 2023-04-17 VITALS — BP 140/80 | HR 62 | Temp 98.5°F | Ht 60.24 in | Wt 214.8 lb

## 2023-04-17 DIAGNOSIS — R600 Localized edema: Secondary | ICD-10-CM | POA: Diagnosis not present

## 2023-04-17 DIAGNOSIS — E1169 Type 2 diabetes mellitus with other specified complication: Secondary | ICD-10-CM | POA: Diagnosis not present

## 2023-04-17 DIAGNOSIS — E782 Mixed hyperlipidemia: Secondary | ICD-10-CM | POA: Diagnosis not present

## 2023-04-17 DIAGNOSIS — I152 Hypertension secondary to endocrine disorders: Secondary | ICD-10-CM

## 2023-04-17 DIAGNOSIS — I5032 Chronic diastolic (congestive) heart failure: Secondary | ICD-10-CM

## 2023-04-17 DIAGNOSIS — E669 Obesity, unspecified: Secondary | ICD-10-CM

## 2023-04-17 DIAGNOSIS — J3089 Other allergic rhinitis: Secondary | ICD-10-CM

## 2023-04-17 DIAGNOSIS — J4520 Mild intermittent asthma, uncomplicated: Secondary | ICD-10-CM | POA: Diagnosis not present

## 2023-04-17 DIAGNOSIS — Z23 Encounter for immunization: Secondary | ICD-10-CM

## 2023-04-17 DIAGNOSIS — Z7189 Other specified counseling: Secondary | ICD-10-CM | POA: Insufficient documentation

## 2023-04-17 DIAGNOSIS — E1159 Type 2 diabetes mellitus with other circulatory complications: Secondary | ICD-10-CM

## 2023-04-17 MED ORDER — FLUTICASONE PROPIONATE 50 MCG/ACT NA SUSP: 1.0000 | Freq: Every day | NASAL | 6 refills | Status: DC

## 2023-04-17 MED ORDER — TORSEMIDE 20 MG PO TABS: 20.0000 mg | ORAL_TABLET | Freq: Every day | ORAL | 1 refills | Status: DC | PRN

## 2023-04-17 MED ORDER — ALBUTEROL SULFATE HFA 108 (90 BASE) MCG/ACT IN AERS: 2.0000 | INHALATION_SPRAY | RESPIRATORY_TRACT | 5 refills | Status: AC | PRN

## 2023-04-17 NOTE — Telephone Encounter (Signed)
Patient is returning call and is requesting call back.  

## 2023-04-17 NOTE — Patient Instructions (Signed)
Your hemoglobin A1c was 6.8% on 03/19/2023.  You likely had it done at weight management.

## 2023-04-17 NOTE — Progress Notes (Signed)
Established Patient Office Visit   Subjective  Patient ID: Beth Macias, female    DOB: [birth year]  Age: 72 y.o. MRN: [id number]  Chief Complaint  Patient presents with   Medical Management of Chronic Issues    Pt is a 72 yo female seen for f/u on chronic conditions.  Patient states she has been doing well.  Losing weight.  Currently seen at weight management.  Has not been checking blood sugar recently.  States BP was 180/120 when checked at the fire department last week.  Patient endorses reading today in clinic much improved.  BP med changed back to carvedilol 25 mg twice daily by cardiology.  Also taking Norvasc 2.5 mg daily and Entresto.  Requesting refill on torsemide as needed for LE edema.  Patient states asthma has been good.  Not having to use albuterol inhaler.   Patient seen by ophthalmologist.  Denies she has mild cataracts.  Notes difficulty seeing up close.  Patient planning on visiting her daughter in [place] for Christmas.  Expresses frustration trying to find a new insurance plan.  Patient states she is on too many medications and is hoping to decrease the number.  States she does not want to be on a statin.   Patient Active Problem List   Diagnosis Date Noted   Other specified counseling 04/17/2023   At high risk for cardiovascular disease 03/31/2023   Statin medication declined by patient 03/31/2023   Generalized obesity starting BMI  44.5 03/17/2023   Suspected sleep apnea 03/17/2023   Chronic heart failure with preserved ejection fraction (HCC) 01/12/2023   Hypertension associated with type 2 diabetes mellitus (HCC) 01/12/2023   Hyperlipidemia associated with type 2 diabetes mellitus (HCC) 01/12/2023   Acute on chronic heart failure with preserved ejection fraction (HFpEF) (HCC) 10/27/2020   Hypertensive urgency 10/27/2020   Type 2 diabetes mellitus with obesity (HCC) 10/27/2020   CHF (congestive heart failure) (HCC) 12/10/2018   Mild intermittent  asthma without complication 06/30/2018   Prediabetes 06/30/2018   Environmental and seasonal allergies 06/30/2018   Diabetes (HCC) 04/04/2018   Upper respiratory infection, viral 04/04/2018   Urinary tract infection 04/04/2018   Morbid obesity with BMI of 40.0-44.9, adult (HCC) 02/18/2016   Extrinsic asthma without complication 06/19/2015   SOB (shortness of breath) 06/19/2015   Cubital tunnel syndrome, right 07/06/2014   Hypokalemia 08/16/2013   Diabetes mellitus type 2, uncomplicated (HCC) 08/11/2013   HTN (hypertension) 08/28/2011   Past Medical History:  Diagnosis Date   Anxiety    Asthma    Back pain    Blood in stool    Chewing difficulty    CHF (congestive heart failure) (HCC)    Colon polyps    Constipation    Diabetes mellitus without complication (HCC)    Diverticulitis    Fibromyalgia    GERD (gastroesophageal reflux disease)    Gout    Hypercholesteremia    Hypertension    Joint pain    Lactose intolerance    Pneumonia    Pneumonia    Prediabetes    SOB (shortness of breath)    UTI (urinary tract infection)    Vitamin B 12 deficiency    Vitamin D deficiency    Past Surgical History:  Procedure Laterality Date   APPENDECTOMY     CESAREAN SECTION     x 3   LIPOMA EXCISION     MENISCUS REPAIR Left    OVARIAN CYST REMOVAL     OVARIAN  CYST SURGERY     Social History   Tobacco Use   Smoking status: Former    Current packs/day: 0.00    Average packs/day: 0.3 packs/day for 9.0 years (2.3 ttl pk-yrs)    Types: Cigarettes    Start date: 05/27/1967    Quit date: 05/26/1976    Years since quitting: 46.9   Smokeless tobacco: Never  Vaping Use   Vaping status: Never Used  Substance Use Topics   Alcohol use: Never   Drug use: Never   Family History  Problem Relation Age of Onset   Heart failure Mother    Gout Mother    Hypertension Mother    Diabetes Mother    High Cholesterol Mother    Heart disease Mother    Obesity Mother    Heart failure  Father    Hypertension Father    AAA (abdominal aortic aneurysm) Father    Heart disease Father    Gout Brother    Hypertension Brother    Heart attack Brother    Colon cancer Maternal Uncle    Crohn's disease Paternal Aunt    Breast cancer Neg Hx    Esophageal cancer Neg Hx    Pancreatic cancer Neg Hx    Stomach cancer Neg Hx     ROS Negative unless stated above    Objective:     BP (!) 140/82 (BP Location: Left Arm, Patient Position: Sitting, Cuff Size: Large)   Pulse 62   Temp 98.5 F (36.9 C) (Oral)   Ht 5' 0.24" (1.53 m)   Wt 214 lb 12.8 oz (97.4 kg)   SpO2 97%   BMI 41.62 kg/m  BP Readings from Last 3 Encounters:  04/17/23 (!) 140/80  03/31/23 (!) 153/99  03/25/23 (!) 148/62   Wt Readings from Last 3 Encounters:  04/17/23 214 lb 12.8 oz (97.4 kg)  03/31/23 216 lb (98 kg)  03/25/23 220 lb (99.8 kg)      Physical Exam Constitutional:      General: She is not in acute distress.    Appearance: Normal appearance. She is obese.  HENT:     Head: Normocephalic and atraumatic.     Nose: Nose normal.     Mouth/Throat:     Mouth: Mucous membranes are moist.  Eyes:     Extraocular Movements: Extraocular movements intact.     Conjunctiva/sclera: Conjunctivae normal.  Cardiovascular:     Rate and Rhythm: Normal rate and regular rhythm.     Heart sounds: Normal heart sounds. No murmur heard.    No gallop.     Comments: No RLE edema.  Wearing ankle brace on the left ankle.  Edema at sock line/brace line.  Trace LLE edema superior to ankle. Pulmonary:     Effort: Pulmonary effort is normal. No respiratory distress.     Breath sounds: Normal breath sounds. No wheezing, rhonchi or rales.  Skin:    General: Skin is warm and dry.  Neurological:     Mental Status: She is alert and oriented to person, place, and time.     No results found for any visits on 04/17/23.    Assessment & Plan:  Type 2 diabetes mellitus with obesity (HCC) -Hemoglobin A1c 6.8% on  03/19/2023. -Body mass index is 41.62 kg/m. -Diet controlled. -Intolerant to [place] -Patient wishes to continue working on weight loss for several months.  If unable to continue making progress we will consider Ozempic at that time. -Continue follow-up with weight management.  Hypertension associated with type 2 diabetes mellitus (HCC) -Improving -Continue Coreg 25 mg twice daily, Norvasc 2.5 mg. -Continue lifestyle modifications  Need for influenza vaccination -     Flu Vaccine Trivalent High Dose (Fluad)  Environmental and seasonal allergies -     Albuterol Sulfate HFA; Inhale 2 puffs into the lungs every 4 (four) hours as needed for wheezing or shortness of breath.  Dispense: 8.5 each; Refill: 5 -     Fluticasone Propionate; Place 1 spray into both nostrils daily.  Dispense: 16 g; Refill: 6  Mild intermittent asthma without complication -Stable       -     Albuterol Sulfate HFA; Inhale 2 puffs into the lungs every 4 (four) hours as needed for wheezing or shortness of breath.  Dispense: 8.5 each; Refill: 5  Bilateral lower extremity edema -Mostly euvolemic on exam.  Mild edema of left ankle 2/2 brace -Lifestyle modifications including decreasing sodium intake -Continue torsemide as needed -     Torsemide; Take 1 tablet (20 mg total) by mouth daily as needed (swelling/fluid/weight gain).  Dispense: 90 tablet; Refill: 1  Mixed hyperlipidemia -Total cholesterol 243, LDL 149 24 -Discussed starting a statin.  Patient declines. -Discussed importance of lifestyle occasions.  Consider fish oil tablets.  Chronic heart failure with preserved ejection fraction (HCC) -Euvolemic -Continue Entresto 49-51 mg -Continue Modifications -Continue Follow-Up with Cardiology   Return in about 3 months (around 07/18/2023).   Deeann Saint, MD

## 2023-04-17 NOTE — Telephone Encounter (Signed)
Returned call to patient and shared echo results per Dr. Eden Emms:  Moderate effusion stable no tamponade at this point repeat echo in a year    Patient states Dr. Rikki Spearing would like to start her on Ozempic but was concerned about her elevated BP (181/103 on 03/17/23). Patient has since followed up with Bernadene Person, NP on 03/25/23 with BP 148/62 (nebivolol 20mg  was stopped and carvedilol 25mg  BID was started). Patient saw PCP today with BP of 140/80.  Patient would like to know if Dr. Eden Emms thought it would be OK for her and her heart to start Ozempic. She states she greatly values his input on this.  Will forward to Dr. Eden Emms and his nurse to review and advise.

## 2023-04-20 ENCOUNTER — Telehealth: Payer: Self-pay

## 2023-04-20 DIAGNOSIS — E859 Amyloidosis, unspecified: Secondary | ICD-10-CM | POA: Diagnosis not present

## 2023-04-20 NOTE — Patient Outreach (Signed)
Attempted to contact patient regarding care gaps. Left voicemail for patient to return my call at [phone].  Nicholes Rough, CMA Care Guide [employer]

## 2023-04-21 NOTE — Telephone Encounter (Signed)
Spoke with patient and informed her of Dr. Fabio Bering response:  Ok to start Ozempic    Patient verbalized understanding and expressed appreciation for Dr. Fabio Bering input and care provided.

## 2023-04-28 LAB — UPEP/UIFE/LIGHT CHAINS/TP, 24-HR UR
% BETA, Urine: 0 %
ALBUMIN, U: 100 %
ALPHA 1 URINE: 0 %
ALPHA-2-GLOBULIN, U: 0 %
Free Kappa Lt Chains,Ur: 1.01 mg/L — ABNORMAL LOW (ref 1.17–86.46)
Free Lambda Lt Chains,Ur: <0.69 mg/L (ref 0.27–15.21)
GAMMA GLOBULIN URINE: 0 %
Kappa/Lambda Ratio,U: >1.46 — ABNORMAL LOW (ref 1.83–14.26)
Protein, 24H Urine: 222 mg/(24.h) — ABNORMAL HIGH (ref 30–150)
Protein, Ur: 10.1 mg/dL

## 2023-04-28 LAB — MULTIPLE MYELOMA PANEL, SERUM
Albumin SerPl Elph-Mcnc: 3.8 g/dL (ref 2.9–4.4)
Albumin/Glob SerPl: 1.4 (ref 0.7–1.7)
Alpha 1: 0.2 g/dL (ref 0.0–0.4)
Alpha2 Glob SerPl Elph-Mcnc: 0.7 g/dL (ref 0.4–1.0)
B-Globulin SerPl Elph-Mcnc: 1.1 g/dL (ref 0.7–1.3)
Gamma Glob SerPl Elph-Mcnc: 0.8 g/dL (ref 0.4–1.8)
Globulin, Total: 2.8 g/dL (ref 2.2–3.9)
IgA/Immunoglobulin A, Serum: 222 mg/dL (ref 64–422)
IgG (Immunoglobin G), Serum: 871 mg/dL (ref 586–1602)
IgM (Immunoglobulin M), Srm: 128 mg/dL (ref 26–217)
Total Protein: 6.6 g/dL (ref 6.0–8.5)

## 2023-05-06 ENCOUNTER — Telehealth: Payer: Self-pay | Admitting: Cardiovascular Disease

## 2023-05-06 ENCOUNTER — Encounter (INDEPENDENT_AMBULATORY_CARE_PROVIDER_SITE_OTHER): Payer: Self-pay | Admitting: Internal Medicine

## 2023-05-06 ENCOUNTER — Ambulatory Visit (INDEPENDENT_AMBULATORY_CARE_PROVIDER_SITE_OTHER): Payer: Medicare PPO | Admitting: Internal Medicine

## 2023-05-06 VITALS — BP 149/82 | HR 76 | Temp 98.2°F | Ht 59.0 in | Wt 211.0 lb

## 2023-05-06 DIAGNOSIS — Z7985 Long-term (current) use of injectable non-insulin antidiabetic drugs: Secondary | ICD-10-CM

## 2023-05-06 DIAGNOSIS — I5032 Chronic diastolic (congestive) heart failure: Secondary | ICD-10-CM | POA: Diagnosis not present

## 2023-05-06 DIAGNOSIS — Z6841 Body Mass Index (BMI) 40.0 and over, adult: Secondary | ICD-10-CM | POA: Diagnosis not present

## 2023-05-06 DIAGNOSIS — E669 Obesity, unspecified: Secondary | ICD-10-CM | POA: Diagnosis not present

## 2023-05-06 DIAGNOSIS — E1169 Type 2 diabetes mellitus with other specified complication: Secondary | ICD-10-CM | POA: Diagnosis not present

## 2023-05-06 MED ORDER — TIRZEPATIDE 2.5 MG/0.5ML ~~LOC~~ SOAJ
2.5000 mg | SUBCUTANEOUS | 0 refills | Status: DC
Start: 2023-05-06 — End: 2023-06-15

## 2023-05-06 NOTE — Assessment & Plan Note (Signed)
HgbA1c is not at goal for age and comorbid conditions. Denies symptoms of hypoglycemia or hyperglycemia.  She is not on medications. Counseled on goals of care, monitoring for complications and importance of staying updated on immunizations and diabetes preventive measures. Continue with reduced calorie meal plan low on processed crabs and simple sugars. Ongoing weight loss will improve insulin resistance and glycemic control  Lab Results  Component Value Date   HGBA1C 6.8 (H) 03/17/2023   HGBA1C 6.8 (H) 08/01/2022   HGBA1C 6.3 02/07/2022   Lab Results  Component Value Date   MICROALBUR 12.4 (H) 08/01/2022   LDLCALC 149 (H) 03/17/2023   CREATININE 0.78 03/04/2023   Considering obesity and high risk comorbidities she is a good candidate for GLP-1 therapy.  After discussion of benefits and side effects she will be started on Mounjaro 2.5 mg once a week.

## 2023-05-06 NOTE — Telephone Encounter (Signed)
Caller Mindi Junker) wants to get diagnosis code for UPEP and SPEP lab work done on 11/25. Reference# [id number].

## 2023-05-06 NOTE — Assessment & Plan Note (Signed)
Reviewed echocardiogram from 2022.  She has LVH with grade 1 diastolic dysfunction.  She has an elevated BNP.  She is followed by cardiology and is currently on Entresto, carvedilol, amlodipine and as needed use of torsemide.  She lacks self-monitoring.  Her blood pressure is above target but control is improving.   Patient counseled on adherence, self-monitoring, maintain a diet low in sodium.  She had been advised to discontinue caffeinated beverages have been drinking a lot of green tea.  She will follow-up with primary care team and continue monitoring blood pressures at home

## 2023-05-06 NOTE — Assessment & Plan Note (Addendum)
See obesity treatment plan  Patient needs to be screened for OSA at the next office visit

## 2023-05-06 NOTE — Telephone Encounter (Signed)
Will forward to Dr. Eden Emms for diagnosis for patient's lab work UPEP and SPEP.

## 2023-05-06 NOTE — Telephone Encounter (Signed)
Left message to call back. 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

## 2023-05-06 NOTE — Progress Notes (Signed)
Office: [phone]  /  Fax: [phone]  Weight Summary And Biometrics  Vitals Temp: 98.2 F (36.8 C) BP: (!) 149/82 Pulse Rate: 76 SpO2: 97 %   Anthropometric Measurements Height: 4\' 11"  (1.499 m) Weight: 211 lb (95.7 kg) BMI (Calculated): 42.59 Weight at Last Visit: 216 lb Weight Lost Since Last Visit: 5lb Weight Gained Since Last Visit: 0 Starting Weight: 220 lb Total Weight Loss (lbs): 9 lb (4.082 kg) Peak Weight: 230 lb   Body Composition  Body Fat %: 56.5 % Fat Mass (lbs): 121 lbs Muscle Mass (lbs): 88.2 lbs Visceral Fat Rating : 21    RMR: [id number]  Today's Visit #: 3  Starting Date: 03/17/23   Subjective   Chief Complaint: Obesity  Beth Macias is here to discuss her progress with her obesity treatment plan. She is on the the Category 2 Plan and states she is following her eating plan approximately 79 % of the time. She states she is exercising 60 minutes 2-3 times per week.  Interval History:   Since last office visit she has lost 5 pounds. She reports good adherence to reduced calorie nutritional plan. She has been working on reading food labels, not skipping meals, increasing protein intake at every meal, drinking more water, making healthier choices, reducing portion sizes, and incorporating more whole foods .  Patient continues to do aqua exercises.  Orexigenic Control:  Reports problems with appetite and hunger signals.  Reports problems with satiety and satiation.  Denies problems with eating patterns and portion control.  Denies abnormal cravings. Denies feeling deprived or restricted.   Barriers identified: medical comorbidities and conservative care preferences .   Pharmacotherapy for weight loss: She is currently taking no anti-obesity medication.   Assessment and Plan   Treatment Plan For Obesity:  Recommended Dietary Goals  Jonte is currently in the action stage of change. As such, her goal is to continue weight management  plan. She has agreed to: continue current plan  Behavioral Intervention  We discussed the following Behavioral Modification Strategies today: continue to work on maintaining a reduced calorie state, getting the recommended amount of protein, incorporating whole foods, making healthy choices, staying well hydrated and practicing mindfulness when eating..  Additional resources provided today: None  Recommended Physical Activity Goals  Takeyla has been advised to work up to 150 minutes of moderate intensity aerobic activity a week and strengthening exercises 2-3 times per week for cardiovascular health, weight loss maintenance and preservation of muscle mass.   She has agreed to :  Think about enjoyable ways to increase daily physical activity and overcoming barriers to exercise and Increase physical activity in their day and reduce sedentary time (increase NEAT).  Pharmacotherapy  We discussed various medication options to help Mateja with her weight loss efforts and we both agreed to : start anti-obesity medication.  In addition to reduced calorie nutrition plan (RCNP), behavioral strategies and physical activity, Avalynne would benefit from pharmacotherapy to assist with hunger signals, satiety and cravings. This will reduce obesity-related health risks by inducing weight loss, and help reduce food consumption and adherence to [facility]) . It may also improve QOL by improving self-confidence and reduce the  setbacks associated with metabolic adaptations.  Patient also has several high risk comorbidities including heart failure, type 2 diabetes, uncontrolled hypertension, hyperlipidemia.  After discussion of treatment options, mechanisms of action, benefits, side effects, contraindications and shared decision making she is agreeable to starting Mounjaro 2.5 mg once a week with the primary indication of  type 2 diabetes. Patient also made aware that medication is indicated for long-term management  of obesity and the risk of weight regain following discontinuation of treatment and hence the importance of adhering to medical weight loss plan.  We demonstrated use of device and patient using teach back method was able to demonstrate proper technique.  Associated Conditions Addressed Today  Type 2 diabetes mellitus with obesity (HCC) Assessment & Plan: HgbA1c is not at goal for age and comorbid conditions. Denies symptoms of hypoglycemia or hyperglycemia.  She is not on medications. Counseled on goals of care, monitoring for complications and importance of staying updated on immunizations and diabetes preventive measures. Continue with reduced calorie meal plan low on processed crabs and simple sugars. Ongoing weight loss will improve insulin resistance and glycemic control  Lab Results  Component Value Date   HGBA1C 6.8 (H) 03/17/2023   HGBA1C 6.8 (H) 08/01/2022   HGBA1C 6.3 02/07/2022   Lab Results  Component Value Date   MICROALBUR 12.4 (H) 08/01/2022   LDLCALC 149 (H) 03/17/2023   CREATININE 0.78 03/04/2023   Considering obesity and high risk comorbidities she is a good candidate for GLP-1 therapy.  After discussion of benefits and side effects she will be started on Mounjaro 2.5 mg once a week.   Orders: -     Tirzepatide; Inject 2.5 mg into the skin once a week.  Dispense: 2 mL; Refill: 0  Chronic heart failure with preserved ejection fraction [facility]) Assessment & Plan: Reviewed echocardiogram from 2022.  She has LVH with grade 1 diastolic dysfunction.  She has an elevated BNP.  She is followed by cardiology and is currently on Entresto, carvedilol, amlodipine and as needed use of torsemide.  She lacks self-monitoring.  Her blood pressure is above target but control is improving.   Patient counseled on adherence, self-monitoring, maintain a diet low in sodium.  She had been advised to discontinue caffeinated beverages have been drinking a lot of green tea.  She will follow-up  with primary care team and continue monitoring blood pressures at home  Orders: -     Tirzepatide; Inject 2.5 mg into the skin once a week.  Dispense: 2 mL; Refill: 0  Generalized obesity Assessment & Plan: See obesity treatment plan  Patient needs to be screened for OSA at the next office visit  Orders: -     Tirzepatide; Inject 2.5 mg into the skin once a week.  Dispense: 2 mL; Refill: 0     Objective   Physical Exam:  Blood pressure (!) 149/82, pulse 76, temperature 98.2 F (36.8 C), height 4\' 11"  (1.499 m), weight 211 lb (95.7 kg), SpO2 97%. Body mass index is 42.62 kg/m.  General: She is overweight, cooperative, alert, well developed, and in no acute distress. PSYCH: Has normal mood, affect and thought process.   HEENT: EOMI, sclerae are anicteric. Lungs: Normal breathing effort, no conversational dyspnea. Extremities: No edema.  Neurologic: No gross sensory or motor deficits. No tremors or fasciculations noted.    Diagnostic Data Reviewed:  BMET    Component Value Date/Time   NA 145 (H) 03/04/2023 1102   K 3.4 (L) 03/04/2023 1102   CL 103 03/04/2023 1102   CO2 27 03/04/2023 1102   GLUCOSE 119 (H) 03/04/2023 1102   GLUCOSE 103 (H) 08/01/2022 1037   BUN 15 03/04/2023 1102   CREATININE 0.78 03/04/2023 1102   CALCIUM 10.1 03/04/2023 1102   GFRNONAA 60 (L) 11/06/2020 1114   GFRAA 86 02/29/2020 1010  Lab Results  Component Value Date   HGBA1C 6.8 (H) 03/17/2023   HGBA1C 7.2 (H) 06/29/2019   Lab Results  Component Value Date   INSULIN 19.7 03/17/2023   Lab Results  Component Value Date   TSH 0.71 08/01/2022   CBC    Component Value Date/Time   WBC 6.3 08/01/2022 1037   RBC 4.81 08/01/2022 1037   HGB 13.8 08/01/2022 1037   HCT 38.2 03/04/2023 1102   PLT 257.0 08/01/2022 1037   MCV 86.7 08/01/2022 1037   MCH 28.0 10/29/2020 0021   MCHC 33.0 08/01/2022 1037   RDW 14.5 08/01/2022 1037   Iron Studies No results found for: "IRON", "TIBC",  "FERRITIN", "IRONPCTSAT" Lipid Panel     Component Value Date/Time   CHOL 243 (H) 03/17/2023 1055   TRIG 146 03/17/2023 1055   HDL 68 03/17/2023 1055   CHOLHDL 4 08/01/2022 1037   VLDL 26.8 08/01/2022 1037   LDLCALC 149 (H) 03/17/2023 1055   Hepatic Function Panel     Component Value Date/Time   PROT 6.6 04/20/2023 1136   ALBUMIN 3.9 08/01/2022 1037   ALBUMIN 3.9 02/29/2020 1010   AST 15 08/01/2022 1037   ALT 12 08/01/2022 1037   ALKPHOS 55 08/01/2022 1037   BILITOT 0.8 08/01/2022 1037   BILITOT 0.7 02/29/2020 1010      Component Value Date/Time   TSH 0.71 08/01/2022 1037   Nutritional Lab Results  Component Value Date   VD25OH 58.4 03/17/2023    Follow-Up   Return in about 4 weeks (around 06/03/2023) for For Weight Mangement with Dr. Rikki Spearing.Marland Kitchen She was informed of the importance of frequent follow up visits to maximize her success with intensive lifestyle modifications for her multiple health conditions.  Attestation Statement   Reviewed by clinician on day of visit: allergies, medications, problem list, medical history, surgical history, family history, social history, and previous encounter notes.   I have spent 40 minutes in the care of the patient today including: preparing to see patient (e.g. review and interpretation of tests, old notes ), obtaining and/or reviewing separately obtained history, performing a medically appropriate examination or evaluation, counseling and educating the patient, ordering medications, test or procedures, documenting clinical information in the electronic or other health care record, and independently interpreting results and communicating results to the patient, family, or caregiver   Worthy Rancher, MD

## 2023-05-08 ENCOUNTER — Ambulatory Visit: Payer: Medicare PPO | Attending: Nurse Practitioner | Admitting: Nurse Practitioner

## 2023-05-08 ENCOUNTER — Encounter: Payer: Self-pay | Admitting: Nurse Practitioner

## 2023-05-08 VITALS — BP 160/82 | HR 70 | Ht 60.0 in | Wt 221.8 lb

## 2023-05-08 DIAGNOSIS — I10 Essential (primary) hypertension: Secondary | ICD-10-CM

## 2023-05-08 DIAGNOSIS — E119 Type 2 diabetes mellitus without complications: Secondary | ICD-10-CM

## 2023-05-08 DIAGNOSIS — I517 Cardiomegaly: Secondary | ICD-10-CM | POA: Diagnosis not present

## 2023-05-08 DIAGNOSIS — E782 Mixed hyperlipidemia: Secondary | ICD-10-CM | POA: Diagnosis not present

## 2023-05-08 DIAGNOSIS — I3139 Other pericardial effusion (noninflammatory): Secondary | ICD-10-CM | POA: Diagnosis not present

## 2023-05-08 DIAGNOSIS — I5022 Chronic systolic (congestive) heart failure: Secondary | ICD-10-CM | POA: Diagnosis not present

## 2023-05-08 NOTE — Patient Instructions (Signed)
Medication Instructions:  Your physician recommends that you continue on your current medications as directed. Please refer to the Current Medication list given to you today.  *If you need a refill on your cardiac medications before your next appointment, please call your pharmacy*   Lab Work: NONE ordered at this time of appointment   Testing/Procedures: NONE ordered at this time of appointment   Follow-Up: At [facility], you and your health needs are our priority.  As part of our continuing mission to provide you with exceptional heart care, we have created designated Provider Care Teams.  These Care Teams include your primary Cardiologist (physician) and Advanced Practice Providers (APPs -  Physician Assistants and Nurse Practitioners) who all work together to provide you with the care you need, when you need it.  We recommend signing up for the patient portal called "MyChart".  Sign up information is provided on this After Visit Summary.  MyChart is used to connect with patients for Virtual Visits (Telemedicine).  Patients are able to view lab/test results, encounter notes, upcoming appointments, etc.  Non-urgent messages can be sent to your provider as well.   To learn more about what you can do with MyChart, go to [email or web address].    Your next appointment:   Keep follow up   Provider:   Charlton Haws, MD     Other Instructions

## 2023-05-08 NOTE — Progress Notes (Signed)
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   Past Medical History    Past Medical History:  Diagnosis Date   Anxiety    Asthma    Back pain    Blood in stool    Chewing difficulty    CHF (congestive heart failure) (HCC)    Colon polyps    Constipation    Diabetes mellitus without complication (HCC)    Diverticulitis    Fibromyalgia    GERD (gastroesophageal reflux disease)    Gout    Hypercholesteremia    Hypertension    Joint pain    Lactose intolerance    Pneumonia    Pneumonia    Prediabetes    SOB (shortness of breath)    UTI (urinary tract infection)    Vitamin B 12 deficiency    Vitamin D deficiency    Past Surgical History:  Procedure Laterality Date   APPENDECTOMY     CESAREAN SECTION     x 3   LIPOMA EXCISION     MENISCUS REPAIR Left    OVARIAN CYST REMOVAL     OVARIAN CYST SURGERY      Allergies  Allergies  Allergen Reactions   Strawberry (Diagnostic) Anaphylaxis and Other (See Comments)    hives   Farxiga [Dapagliflozin] Other (See Comments)    Had HAs, weakness, rhinorrhea, jitteriness, nasal congestion, nocturia, yeast infections, memory changes, irritability.   Penicillins Itching    Has patient had a PCN reaction causing immediate rash, facial/tongue/throat swelling, SOB or lightheadedness with hypotension: No Has patient had a PCN reaction causing severe rash involving mucus membranes or skin necrosis: No Has patient had a PCN reaction that required hospitalization: Unknown Has patient had a PCN reaction occurring within the last 10  years: No If all of the above answers are "NO", then may proceed with Cephalosporin use.   Amoxicillin Other (See Comments)   Gabapentin Itching, Rash and Other (See Comments)    Incoherent   Tape Other (See Comments)    Blisters     Labs/Other Studies Reviewed    The following studies were reviewed today:  Cardiac Studies & Procedures     STRESS TESTS  MYOCARDIAL PERFUSION IMAGING 12/13/2018  Narrative  Nuclear stress EF: 32%. The left ventricular ejection fraction is moderately decreased (30-44%).  Blood pressure demonstrated a normal response to exercise.  This is a high risk study based on the severe LV dysfunction.  There is no evidence of ischemia and no evidence of previous infarction  ECHOCARDIOGRAM  ECHOCARDIOGRAM COMPLETE 04/16/2023  Narrative ECHOCARDIOGRAM REPORT    Patient Name:   Beth Macias Date of Exam: 04/16/2023 Medical Rec #:  [id number]          Height:       59.0 in Accession #:    [id number]         Weight:       216.0 lb Date of Birth:  [birth year]          BSA:          1.906 m Patient Age:    31 years  BP:           155/95 mmHg Patient Gender: F                  HR:           58 bpm. Exam Location:  [address]  Procedure: 2D Echo, Color Doppler, Cardiac Doppler, 3D Echo and Strain Analysis  Indications:    Pericardial Effusion I31.3  History:        Patient has prior history of Echocardiogram examinations, most recent 02/25/2021. CHF; Risk Factors:Diabetes and Hypertension.  Sonographer:    Thurman Coyer RDCS Referring Phys: [id number] [address] Stade  IMPRESSIONS   1. Left ventricular ejection fraction, by estimation, is 50 to 55%. The left ventricle has low normal function. The left ventricle demonstrates regional wall motion abnormalities with possible basal inferior hypokinesis. There is moderate concentric left ventricular hypertrophy. Left ventricular diastolic parameters are consistent with Grade I diastolic  dysfunction (impaired relaxation). The average left ventricular global longitudinal strain is -16.8 %. The global longitudinal strain is abnormal. 2. Right ventricular systolic function is normal. The right ventricular size is normal. Tricuspid regurgitation signal is inadequate for assessing PA pressure. 3. The mitral valve is normal in structure. No evidence of mitral valve regurgitation. No evidence of mitral stenosis. 4. The aortic valve is tricuspid. There is mild calcification of the aortic valve. Aortic valve regurgitation is not visualized. No aortic stenosis is present. 5. The inferior vena cava is normal in size with greater than 50% respiratory variability, suggesting right atrial pressure of 3 mmHg. 6. No evidence for tamponade. No RV diastolic collapse, <25% respirophasic variation of the mitral E inflow velocity, IVC not dilated. Moderate pericardial effusion. The pericardial effusion is circumferential.  FINDINGS Left Ventricle: Left ventricular ejection fraction, by estimation, is 50 to 55%. The left ventricle has low normal function. The left ventricle demonstrates regional wall motion abnormalities. The average left ventricular global longitudinal strain is -16.8 %. The global longitudinal strain is abnormal. The left ventricular internal cavity size was normal in size. There is moderate concentric left ventricular hypertrophy. Left ventricular diastolic parameters are consistent with Grade I diastolic dysfunction (impaired relaxation).  Right Ventricle: The right ventricular size is normal. No increase in right ventricular wall thickness. Right ventricular systolic function is normal. Tricuspid regurgitation signal is inadequate for assessing PA pressure.  Left Atrium: Left atrial size was normal in size.  Right Atrium: Right atrial size was normal in size.  Pericardium: No evidence for tamponade. No RV diastolic collapse, <25% respirophasic variation of the mitral E inflow  velocity, IVC not dilated. A moderately sized pericardial effusion is present. The pericardial effusion is circumferential.  Mitral Valve: The mitral valve is normal in structure. No evidence of mitral valve regurgitation. No evidence of mitral valve stenosis.  Tricuspid Valve: The tricuspid valve is normal in structure. Tricuspid valve regurgitation is trivial.  Aortic Valve: The aortic valve is tricuspid. There is mild calcification of the aortic valve. Aortic valve regurgitation is not visualized. No aortic stenosis is present.  Pulmonic Valve: The pulmonic valve was normal in structure. Pulmonic valve regurgitation is not visualized.  Aorta: The aortic root is normal in size and structure.  Venous: The inferior vena cava is normal in size with greater than 50% respiratory variability, suggesting right atrial pressure of 3 mmHg.  IAS/Shunts: No atrial level shunt detected by color flow Doppler.   LEFT VENTRICLE PLAX 2D LVIDd:  4.30 cm LVIDs:         2.90 cm   2D Longitudinal Strain LV PW:         1.60 cm   2D Strain GLS Avg:     -16.8 % LV IVS:        1.60 cm LVOT diam:     2.20 cm LV SV:         67 LV SV Index:   35        3D Volume EF: LVOT Area:     3.80 cm  3D EF:        54 % LV EDV:       148 ml LV ESV:       67 ml LV SV:        80 ml  RIGHT VENTRICLE             IVC RV Basal diam:  3.00 cm     IVC diam: 1.70 cm RV Mid diam:    3.00 cm RV S prime:     10.10 cm/s  LEFT ATRIUM             Index        RIGHT ATRIUM           Index LA diam:        3.70 cm 1.94 cm/m   RA Area:     14.40 cm LA Vol (A2C):   47.2 ml 24.77 ml/m  RA Volume:   31.80 ml  16.69 ml/m LA Vol (A4C):   50.1 ml 26.29 ml/m LA Biplane Vol: 49.2 ml 25.82 ml/m AORTIC VALVE LVOT Vmax:   84.20 cm/s LVOT Vmean:  50.400 cm/s LVOT VTI:    0.175 m  AORTA Ao Root diam: 2.90 cm Ao Asc diam:  3.40 cm  MITRAL VALVE MV Area (PHT): 3.08 cm    SHUNTS MV Decel Time: 246 msec    Systemic VTI:   0.18 m MV E velocity: 56.90 cm/s  Systemic Diam: 2.20 cm MV A velocity: 62.50 cm/s MV E/A ratio:  0.91  Dalton McleanMD Electronically signed by Wilfred Lacy Signature Date/Time: 04/16/2023/1:04:16 PM    Final     CARDIAC MRI  MR CARDIAC MORPHOLOGY W WO CONTRAST 03/11/2023  Narrative CLINICAL DATA:  R/O Amyloid  EXAM: CARDIAC MRI  TECHNIQUE: The patient was scanned on a 1.5 Tesla Siemens magnet. A dedicated cardiac coil was used. Functional imaging was done using Fiesta sequences. 2,3, and 4 chamber views were done to assess for RWMA's. Modified Simpson's rule using a short axis stack was used to calculate an ejection fraction on a dedicated [employer]. The patient received 10 cc of Gadavist. After 10 minutes inversion recovery sequences were used to assess for infiltration and scar tissue.  CONTRAST:  10mL GADAVIST GADOBUTROL 1 MMOL/ML IV SOLN  FINDINGS: Mild LAE normal RA No LAA thrombus. No ASD/PFO. Normal ascending thoracic aorta 3.4 cm. Tri leaflet AV with no AR/AS  Mildly thickened MV with trivial appearing MR. Large circumferential pericardial effusion with no evidence of tamponade on free breathing sequences.  There is moderate LVH with septal thickness 14 mm. No RWMA;s and low normal systolic LV function. There is normal nulling of the myocardium prior to the LV cavity on post gadolinium images. There is no significant late gadolinium uptake to suggest amyloid or infiltrative cardiomyopathy.  Quantitative LVEF 51% (EDV 172 cc ESV 85 cc SV 88 cc) Estimated cardiac output 4.5 L/min  Quantitative RVEF 54% (EDV 116 cc ESV 53 cc SV 63 cc )  Parametric Measures: Using Hct of 38  T1: Upper normal 1055 msec  ECV: Minimally elevated 31%  T2: Normal 45 msec  IMPRESSION: 1.  Moderate LVH septal thickness 14 mm Low normal LVEF 51%  2.  Normal RVEF 54%  3.  Normal myocardial nulling with no late gadolinium uptake  4.   Estimated cardiac output 4.5 L/min  5. Normal T1/T2 with minimally elevated ECV not consistent with amyloid  6. Large circumferential pericardial effusion with no tamponade physiology noted on free breathing sequences  Charlton Haws   Electronically Signed By: Charlton Haws M.D. On: 03/11/2023 17:06  PYP SCAN  MYOCARDIAL AMYLOID PLANAR AND SPECT 01/22/2023  Narrative   Myocardial uptake was positive for radiotracer uptake. The visual grade of myocardial uptake relative to the ribs was Grade 1 (Myocardial uptake less than rib uptake).   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   Prior study not available for comparison.   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   Echocardiogram in June 2020 revealed EF 30 to 35%, moderate MR, small to moderate size pericardial effusion.  Myoview in July 2020 showed normal perfusion, LVEF 32%.  She was started on GDMT.  Echocardiogram in 2021 showed EF improved to 50 to 55%, moderate pericardial effusion with no evidence of tamponade, trivial MR.  Echocardiogram in 02/2021 showed EF 60 to 65%, moderate LVH with speckling. Given concern for amyloid pattern on prior echocardiogram, she underwent PYP scan which revealed positive uptake.  Follow-up cardiac MRI showed moderate  LVH, EF 51%, no late gadolinium uptake, not consistent with amyloid.  There was evidence of a large circumferential pericardial effusion with no signs of tamponade.   She was last seen in the office on 03/25/2023 and was stable from a cardiac standpoint. BP was elevated despite addition of amlodipine.  Per patient request, nebivolol was discontinued and her prior dose of carvedilol was resumed.  Repeat echocardiogram in 03/2023 EF 50 to 55%, low normal LV function, possible basal inferior hypokinesis, moderate concentric LVH, G1 DD, normal RV systolic function, moderate pericardial effusion with no evidence of tamponade.  Repeat echocardiogram was recommended in 1 year.   She presents today for follow-up.  Since her last visit she has done well from a cardiac standpoint.  Her BP has improved, still slightly above goal.   She denies any symptoms concerning for angina.  She has stable nonpitting bilateral lower extremity edema, controlled with torsemide.  She is looking forward to traveling to [place] with her grandchildren and children, she leaves today.  She is not interested in making any medication changes at this time and will continue to monitor her BP at home. Overall, she reports feeling well.  Home Medications    Current Outpatient Medications  Medication Sig Dispense Refill   albuterol (VENTOLIN HFA) 108 (90 Base) MCG/ACT inhaler Inhale 2 puffs into the lungs every 4 (four) hours as needed for wheezing or shortness of breath. 8.5 each 5   allopurinol (ZYLOPRIM) 100 MG tablet TAKE 1 TABLET(100 MG) BY MOUTH DAILY 30 tablet 6   amLODipine (NORVASC) 2.5 MG tablet Take 1 tablet (2.5 mg total) by mouth daily. 30 tablet 0   B Complex-Minerals (ELDERTONIC PO) Take 15 mLs by mouth daily. Geritol     blood glucose meter kit and supplies KIT Dispense based on patient and insurance preference. Use up to four times daily as directed. 1 each 0   Blood Pressure Monitoring (BLOOD PRESSURE CUFF) MISC For  daily monitoring as directed. 1 each 0   Calcium-Magnesium-Vitamin D (CALCIUM 1200+D3 PO) Take 600 mg by mouth daily.     carvedilol (COREG) 25 MG tablet Take 1 tablet (25 mg total) by mouth 2 (two) times daily. 180 tablet 3   cholecalciferol (VITAMIN D) 25 MCG (1000 UNIT) tablet Take 1,000 Units by mouth at bedtime.      Cyanocobalamin (CVS B12 GUMMIES PO) Take 1,000 mcg by mouth daily.     famotidine (PEPCID) 40 MG tablet Take 1 tablet (40 mg total) by mouth in the morning. 90 tablet 3   fluticasone (FLONASE) 50 MCG/ACT nasal spray Place 1 spray into both nostrils daily. 16 g 6   ipratropium-albuterol (DUONEB) 0.5-2.5 (3) MG/3ML SOLN Take 3 mLs by nebulization every 6 (six) hours as needed (shortness of breath/wheezing).     levocetirizine (XYZAL) 5 MG tablet Take 1 tablet (5 mg total) by mouth every evening. 90 tablet 1   montelukast (SINGULAIR) 10 MG tablet TAKE 1 TABLET EVERY DAY 90 tablet 3   ONETOUCH ULTRA test strip USE TO TEST BLOOD SUGAR UP TO FOUR TIMES DAILY 100 strip 3   potassium chloride 20 MEQ/15ML (10%) SOLN Take 15 mLs (20 mEq total) by mouth daily as needed (with each dose of torsemide). (Patient taking differently: Take 20 mEq by mouth as directed.) 473 mL 0   sacubitril-valsartan (ENTRESTO) 49-51 MG Take 1 tablet by mouth 2 (two) times daily. 180 tablet 3   tirzepatide (MOUNJARO) 2.5 MG/0.5ML Pen Inject 2.5 mg into the skin once a week. 2 mL 0   torsemide (DEMADEX) 20 MG tablet Take 1 tablet (20 mg total) by mouth daily as needed (swelling/fluid/weight gain). 90 tablet 1   triamcinolone cream (KENALOG) 0.1 % Apply topically 2 (two) times daily.     vitamin C (ASCORBIC ACID) 500 MG tablet Take 500 mg by mouth daily.     No current facility-administered medications for this visit.     Review of Systems    She denies chest pain, palpitations, dyspnea, pnd, orthopnea, n, v, dizziness, syncope, weight gain, or early satiety. All other systems reviewed and are otherwise negative  except as noted above.   Physical Exam    VS:  BP (!) 160/82   Pulse 70   Ht 5' (1.524 m)   Wt 221 lb 12.8 oz (100.6 kg)   SpO2 91%   BMI 43.32 kg/m  GEN: Well nourished, well developed, in no acute distress. HEENT: normal. Neck: Supple, no JVD, carotid bruits, or masses. Cardiac: RRR, no murmurs, rubs, or gallops. No clubbing, cyanosis, edema.  Radials/DP/PT 2+ and equal bilaterally.  Respiratory:  Respirations regular and unlabored, clear to auscultation bilaterally. GI: Soft, nontender, nondistended, BS +  x 4. MS: no deformity or atrophy. Skin: warm and dry, no rash. Neuro:  Strength and sensation are intact. Psych: Normal affect.  Accessory Clinical Findings    ECG personally reviewed by me today -    - no  EKG in office today.   Lab Results  Component Value Date   WBC 6.3 08/01/2022   HGB 13.8 08/01/2022   HCT 38.2 03/04/2023   MCV 86.7 08/01/2022   PLT 257.0 08/01/2022   Lab Results  Component Value Date   CREATININE 0.78 03/04/2023   BUN 15 03/04/2023   NA 145 (H) 03/04/2023   K 3.4 (L) 03/04/2023   CL 103 03/04/2023   CO2 27 03/04/2023   Lab Results  Component Value Date   ALT 12 08/01/2022   AST 15 08/01/2022   ALKPHOS 55 08/01/2022   BILITOT 0.8 08/01/2022   Lab Results  Component Value Date   CHOL 243 (H) 03/17/2023   HDL 68 03/17/2023   LDLCALC 149 (H) 03/17/2023   TRIG 146 03/17/2023   CHOLHDL 4 08/01/2022    Lab Results  Component Value Date   HGBA1C 6.8 (H) 03/17/2023    Assessment & Plan    1. Hypertension: BP remains elevated in office today. In discussing options for blood pressure management, she notes she did not tolerate a higher dose of Entresto is not interested in attempting to increase Entresto at this time. Continue to monitor BP report BP consistently greater than 140/80.  If BP remains elevated above goal, consider increasing amlodipine to 5 mg daily.  For now, continue low-dose amlodipine, carvedilol and Entresto.    2.  Pericardial effusion: Cardiac MRI in 02/2023 showed evidence of a large circumferential pericardial effusion with no signs of tamponade.   Repeat echocardiogram in 03/2023 EF 50 to 55%, low normal LV function, possible basal inferior hypokinesis, moderate concentric LVH, G1 DD, normal RV systolic function, moderate pericardial effusion with no evidence of tamponade.  Asymptomatic.  Repeat echocardiogram was recommended in 1 year.  3. LVH: Given concern for amyloid pattern on prior echocardiogram, she underwent PYP scan which revealed positive uptake.  Follow-up cardiac MRI showed moderate LVH, EF 51%, no late gadolinium uptake, not consistent with amyloid.     4. Chronic systolic heart failure with improved EF: Prior EF 30 to 35%. Most recent echocardiogram in 03/2023 showed EF 50-55%, moderate LVH.  She has stable nonpitting bilateral lower extremity edema, well-controlled with as needed torsemide.  Generally euvolemic, compensated on exam.  Continue prn torsemide, current medications as above.   5. Hyperlipidemia: LDL was 149 in 02/2023.  She is not on statin therapy at this time.  Monitored and managed per PCP.   6. Type 2 diabetes/obesity: A1c was 6.8 in 02/2023.  Monitored and managed per PCP.  [facility]  Encouraged ongoing lifestyle modifications.   7. Disposition: Follow-up as scheduled with Dr. Eden Emms in 06/2022.  HYPERTENSION CONTROL Vitals:   05/08/23 1108 05/08/23 1216  BP: (!) 164/84 (!) 160/82    The patient's blood pressure is elevated above target today.  In order to address the patient's elevated BP:       Joylene Grapes, NP 05/08/2023, 12:33 PM

## 2023-05-13 NOTE — Telephone Encounter (Signed)
[employer] and gave them the diagnosis code.

## 2023-06-15 ENCOUNTER — Encounter (INDEPENDENT_AMBULATORY_CARE_PROVIDER_SITE_OTHER): Payer: Self-pay | Admitting: Internal Medicine

## 2023-06-15 ENCOUNTER — Ambulatory Visit (INDEPENDENT_AMBULATORY_CARE_PROVIDER_SITE_OTHER): Payer: Medicare Other | Admitting: Internal Medicine

## 2023-06-15 VITALS — BP 146/91 | HR 73 | Temp 98.1°F | Ht 59.0 in | Wt 209.0 lb

## 2023-06-15 DIAGNOSIS — E1159 Type 2 diabetes mellitus with other circulatory complications: Secondary | ICD-10-CM | POA: Diagnosis not present

## 2023-06-15 DIAGNOSIS — E1169 Type 2 diabetes mellitus with other specified complication: Secondary | ICD-10-CM | POA: Diagnosis not present

## 2023-06-15 DIAGNOSIS — Z7985 Long-term (current) use of injectable non-insulin antidiabetic drugs: Secondary | ICD-10-CM

## 2023-06-15 DIAGNOSIS — E669 Obesity, unspecified: Secondary | ICD-10-CM | POA: Diagnosis not present

## 2023-06-15 DIAGNOSIS — Z6841 Body Mass Index (BMI) 40.0 and over, adult: Secondary | ICD-10-CM

## 2023-06-15 DIAGNOSIS — I152 Hypertension secondary to endocrine disorders: Secondary | ICD-10-CM | POA: Diagnosis not present

## 2023-06-15 MED ORDER — TIRZEPATIDE 5 MG/0.5ML ~~LOC~~ SOAJ
5.0000 mg | SUBCUTANEOUS | 0 refills | Status: DC
Start: 2023-06-15 — End: 2023-07-07

## 2023-06-15 NOTE — Assessment & Plan Note (Signed)
See obesity treatment plan.  We will increase Mounjaro to 5 mg once a week

## 2023-06-15 NOTE — Progress Notes (Signed)
Office: [phone]  /  Fax: [phone]  Weight Summary And Biometrics  Vitals Temp: 98.1 F (36.7 C) BP: (!) 146/91 Pulse Rate: 73 SpO2: 97 %   Anthropometric Measurements Height: 4\' 11"  (1.499 m) Weight: 209 lb (94.8 kg) BMI (Calculated): 42.19 Weight at Last Visit: 211 lb Weight Lost Since Last Visit: 2 lb Weight Gained Since Last Visit: 0 lb Starting Weight: 220 lb Total Weight Loss (lbs): 11 lb (4.99 kg) Peak Weight: 230 lb   Body Composition  Body Fat %: 55.2 % Fat Mass (lbs): 115.8 lbs Muscle Mass (lbs): 89.2 lbs Visceral Fat Rating : 20    RMR: [id number]  Today's Visit #: 4  Starting Date: 03/17/23   Subjective   Chief Complaint: Obesity  Beth Macias is here to discuss her progress with her obesity treatment plan. She is on the the Category 2 Plan and states she is following her eating plan approximately 75-80 % of the time. She states she is not exercising.  Weight Progress Since Last Visit: Discussed the use of AI scribe software for clinical note transcription with the patient, who gave verbal consent to proceed.  History of Present Illness   The patient, affected by obesity, type two diabetes, hypertension, and chronic heart failure with preserved ejection fraction, presents for medical weight management. She reports no side effects from the [facility] medication started in December. She has been mindful of her diet, incorporating greens and lean proteins, and has noticed a decrease in her cravings for sweets. However, she admits to occasional lapses, particularly during the holiday season, and expresses a desire to better manage these cravings.  The patient also reports a recent increase in blood pressure, which she attributes to stress. She has been monitoring her blood pressure at home, with readings averaging around 140/80. She is due to see her cardiologist in the coming weeks and plans to discuss these readings.  I had started her on amlodipine 2.5  mg last office visit but medication has been discontinued.  Her medications are being adjusted by cardiology.  In terms of physical activity, the patient acknowledges a decrease during the colder months and while traveling. She expresses a desire to return to regular exercise, particularly swimming. Despite these challenges, the patient has lost 11 pounds since starting her weight management plan.       Patient Reported Barriers to Progress: low volume of physical activity at present  and medical comorbidities.   Orexigenic Control: Reports improved problems with appetite and hunger signals.  Reports improved problems with satiety and satiation.  Denies problems with eating patterns and portion control.  Denies abnormal cravings. Reports feeling deprived or restricted.   Pharmacotherapy for weight management: She is currently taking Monjauro with diabetes as the primary indication with adequate clinical response  and without side effects..   Assessment and Plan   Treatment Plan For Obesity:  Recommended Dietary Goals  Beth Macias is currently in the action stage of change. As such, her goal is to continue weight management plan. She has agreed to: continue current plan  Behavioral Health and Counseling  We discussed the following behavioral modification strategies today: increasing lean protein intake to established goals, decreasing simple carbohydrates , and continue to work on maintaining a reduced calorie state, getting the recommended amount of protein, incorporating whole foods, making healthy choices, staying well hydrated and practicing mindfulness when eating..  Additional education and resources provided today: None  Recommended Physical Activity Goals  Beth Macias has been advised to work up  to 150 minutes of moderate intensity aerobic activity a week and strengthening exercises 2-3 times per week for cardiovascular health, weight loss maintenance and preservation of muscle  mass.   She has agreed to :  Think about enjoyable ways to increase daily physical activity and overcoming barriers to exercise and Increase physical activity in their day and reduce sedentary time (increase NEAT).  Pharmacotherapy  We discussed various medication options to help Beth Macias with her weight loss efforts and we both agreed to : increase Mounjaro to 5 mg once a week  Associated Conditions Impacted by Obesity Treatment  Hypertension associated with type 2 diabetes mellitus (HCC) Assessment & Plan: Blood pressure slightly elevated today. Home readings around 140/80 mmHg. Previously on amlodipine but discontinued by another provider. Discussed importance of maintaining blood pressure closer to 130/80 mmHg due to heart failure. Explained that higher blood pressure increases heart workload, leading to heart muscle stiffening and heart failure. - Bring blood pressure readings to cardiologist appointment on February 18 - Monitor blood pressure regularly at home -Continue current regimen for now. -Losing 10% of body weight may improve blood pressure control. -Continue medical weight loss plan inclusive of GLP-1 therapy   Type 2 diabetes mellitus with obesity (HCC) Assessment & Plan: HgbA1c is not at goal for age and comorbid conditions. Denies symptoms of hypoglycemia or hyperglycemia.  She is not on medications. Counseled on goals of care, monitoring for complications and importance of staying updated on immunizations and diabetes preventive measures. Continue with reduced calorie meal plan low on processed crabs and simple sugars. Ongoing weight loss will improve insulin resistance and glycemic control  Lab Results  Component Value Date   HGBA1C 6.8 (H) 03/17/2023   HGBA1C 6.8 (H) 08/01/2022   HGBA1C 6.3 02/07/2022   Lab Results  Component Value Date   MICROALBUR 12.4 (H) 08/01/2022   LDLCALC 149 (H) 03/17/2023   CREATININE 0.78 03/04/2023    She is currently on Mounjaro  2.5 mg once a week, tolerating treatment well without any adverse effects.  We will increase to 5 mg weekly.   Orders: -     Tirzepatide; Inject 5 mg into the skin once a week.  Dispense: 2 mL; Refill: 0  Generalized obesity Assessment & Plan: See obesity treatment plan.  We will increase Mounjaro to 5 mg once a week  Orders: -     Tirzepatide; Inject 5 mg into the skin once a week.  Dispense: 2 mL; Refill: 0         Objective   Physical Exam:  Blood pressure (!) 146/91, pulse 73, temperature 98.1 F (36.7 C), height 4\' 11"  (1.499 m), weight 209 lb (94.8 kg), SpO2 97%. Body mass index is 42.21 kg/m.  General: She is overweight, cooperative, alert, well developed, and in no acute distress. PSYCH: Has normal mood, affect and thought process.   HEENT: EOMI, sclerae are anicteric. Lungs: Normal breathing effort, no conversational dyspnea. Extremities: No edema.  Neurologic: No gross sensory or motor deficits. No tremors or fasciculations noted.    Diagnostic Data Reviewed:  BMET    Component Value Date/Time   NA 145 (H) 03/04/2023 1102   K 3.4 (L) 03/04/2023 1102   CL 103 03/04/2023 1102   CO2 27 03/04/2023 1102   GLUCOSE 119 (H) 03/04/2023 1102   GLUCOSE 103 (H) 08/01/2022 1037   BUN 15 03/04/2023 1102   CREATININE 0.78 03/04/2023 1102   CALCIUM 10.1 03/04/2023 1102   GFRNONAA 60 (L) 11/06/2020 1114  GFRAA 86 02/29/2020 1010   Lab Results  Component Value Date   HGBA1C 6.8 (H) 03/17/2023   HGBA1C 7.2 (H) 06/29/2019   Lab Results  Component Value Date   INSULIN 19.7 03/17/2023   Lab Results  Component Value Date   TSH 0.71 08/01/2022   CBC    Component Value Date/Time   WBC 6.3 08/01/2022 1037   RBC 4.81 08/01/2022 1037   HGB 13.8 08/01/2022 1037   HCT 38.2 03/04/2023 1102   PLT 257.0 08/01/2022 1037   MCV 86.7 08/01/2022 1037   MCH 28.0 10/29/2020 0021   MCHC 33.0 08/01/2022 1037   RDW 14.5 08/01/2022 1037   Iron Studies No results found for:  "IRON", "TIBC", "FERRITIN", "IRONPCTSAT" Lipid Panel     Component Value Date/Time   CHOL 243 (H) 03/17/2023 1055   TRIG 146 03/17/2023 1055   HDL 68 03/17/2023 1055   CHOLHDL 4 08/01/2022 1037   VLDL 26.8 08/01/2022 1037   LDLCALC 149 (H) 03/17/2023 1055   Hepatic Function Panel     Component Value Date/Time   PROT 6.6 04/20/2023 1136   ALBUMIN 3.9 08/01/2022 1037   ALBUMIN 3.9 02/29/2020 1010   AST 15 08/01/2022 1037   ALT 12 08/01/2022 1037   ALKPHOS 55 08/01/2022 1037   BILITOT 0.8 08/01/2022 1037   BILITOT 0.7 02/29/2020 1010      Component Value Date/Time   TSH 0.71 08/01/2022 1037   Nutritional Lab Results  Component Value Date   VD25OH 58.4 03/17/2023    Follow-Up   Return in about 3 weeks (around 07/06/2023) for For Weight Mangement with Dr. Rikki Spearing.Marland Kitchen She was informed of the importance of frequent follow up visits to maximize her success with intensive lifestyle modifications for her multiple health conditions.  Attestation Statement   Reviewed by clinician on day of visit: allergies, medications, problem list, medical history, surgical history, family history, social history, and previous encounter notes.     Worthy Rancher, MD

## 2023-06-15 NOTE — Assessment & Plan Note (Signed)
HgbA1c is not at goal for age and comorbid conditions. Denies symptoms of hypoglycemia or hyperglycemia.  She is not on medications. Counseled on goals of care, monitoring for complications and importance of staying updated on immunizations and diabetes preventive measures. Continue with reduced calorie meal plan low on processed crabs and simple sugars. Ongoing weight loss will improve insulin resistance and glycemic control  Lab Results  Component Value Date   HGBA1C 6.8 (H) 03/17/2023   HGBA1C 6.8 (H) 08/01/2022   HGBA1C 6.3 02/07/2022   Lab Results  Component Value Date   MICROALBUR 12.4 (H) 08/01/2022   LDLCALC 149 (H) 03/17/2023   CREATININE 0.78 03/04/2023    She is currently on Mounjaro 2.5 mg once a week, tolerating treatment well without any adverse effects.  We will increase to 5 mg weekly.

## 2023-06-15 NOTE — Assessment & Plan Note (Signed)
Blood pressure slightly elevated today. Home readings around 140/80 mmHg. Previously on amlodipine but discontinued by another provider. Discussed importance of maintaining blood pressure closer to 130/80 mmHg due to heart failure. Explained that higher blood pressure increases heart workload, leading to heart muscle stiffening and heart failure. - Bring blood pressure readings to cardiologist appointment on February 18 - Monitor blood pressure regularly at home -Continue current regimen for now. -Losing 10% of body weight may improve blood pressure control. -Continue medical weight loss plan inclusive of GLP-1 therapy

## 2023-06-16 ENCOUNTER — Telehealth (INDEPENDENT_AMBULATORY_CARE_PROVIDER_SITE_OTHER): Payer: Self-pay | Admitting: Internal Medicine

## 2023-06-16 ENCOUNTER — Other Ambulatory Visit: Payer: Self-pay | Admitting: Family Medicine

## 2023-06-16 DIAGNOSIS — E876 Hypokalemia: Secondary | ICD-10-CM

## 2023-06-16 NOTE — Telephone Encounter (Signed)
Spoke to patient and advised the PA is complete and she will be notified if approved or denied.

## 2023-06-16 NOTE — Telephone Encounter (Signed)
Copied from CRM [id number]. Topic: Clinical - Medication Refill >> Jun 16, 2023 12:35 PM Sonny Dandy B wrote: Most Recent Primary Care Visit:  Provider: Worthy Rancher  Department: [facility]  Visit Type: FOLLOW UP  Date: 06/15/2023  Medication: potassium chloride 20 MEQ/15ML (10%) SOLN   Has the patient contacted their pharmacy? Yes (Agent: If no, request that the patient contact the pharmacy for the refill. If patient does not wish to contact the pharmacy document the reason why and proceed with request.) (Agent: If yes, when and what did the pharmacy advise?)  Is this the correct pharmacy for this prescription? Yes If no, delete pharmacy and type the correct one.  This is the patient's preferred pharmacy:  [name] - [place], [place] - [address] AT [facility] [address] [facility] [address] [place] [address] Phone: [phone] Fax: [phone]    Has the prescription been filled recently? Yes  Is the patient out of the medication? Yes  Has the patient been seen for an appointment in the last year OR does the patient have an upcoming appointment? Yes  Can we respond through MyChart? No  Agent: Please be advised that Rx refills may take up to 3 business days. We ask that you follow-up with your pharmacy.

## 2023-06-16 NOTE — Telephone Encounter (Signed)
Pt called stating she needs a prior authorization for the medication Mounjaro. Please follow up with pt, she is asking that someone call her to give her an update.

## 2023-06-17 ENCOUNTER — Telehealth (INDEPENDENT_AMBULATORY_CARE_PROVIDER_SITE_OTHER): Payer: Self-pay

## 2023-06-17 ENCOUNTER — Other Ambulatory Visit: Payer: Self-pay | Admitting: Family Medicine

## 2023-06-17 DIAGNOSIS — E876 Hypokalemia: Secondary | ICD-10-CM

## 2023-06-17 NOTE — Telephone Encounter (Signed)
 PA for [facility] approved, pt notified.

## 2023-06-17 NOTE — Telephone Encounter (Unsigned)
Copied from CRM [id number]. Topic: Clinical - Medication Refill >> Jun 17, 2023 10:45 AM Ferdie Ping wrote: Most Recent Primary Care Visit: 04/17/2023 Provider:Banks, Carollee Herter  Department: [facility]  Visit Type: FAM MED  Date:04/17/2023  Medication: potassium chloride 20 MEQ/15ML (10%) SOLN  Has the patient contacted their pharmacy? No (Agent: If no, request that the patient contact the pharmacy for the refill. If patient does not wish to contact the pharmacy document the reason why and proceed with request.) (Agent: If yes, when and what did the pharmacy advise?)  Is this the correct pharmacy for this prescription? Yes If no, delete pharmacy and type the correct one.  This is the patient's preferred pharmacy:   [name] - [place], [place] - [address] AT [facility] [address] [facility] [address] [place] [address] Phone: [phone] Fax: [phone]    Has the prescription been filled recently? No  Is the patient out of the medication? Yes  Has the patient been seen for an appointment in the last year OR does the patient have an upcoming appointment? Yes  Can we respond through MyChart? Yes  Agent: Please be advised that Rx refills may take up to 3 business days. We ask that you follow-up with your pharmacy.

## 2023-06-18 ENCOUNTER — Other Ambulatory Visit: Payer: Self-pay | Admitting: Family Medicine

## 2023-06-18 DIAGNOSIS — E876 Hypokalemia: Secondary | ICD-10-CM

## 2023-06-18 MED ORDER — POTASSIUM CHLORIDE 20 MEQ/15ML (10%) PO SOLN
20.0000 meq | Freq: Every day | ORAL | 0 refills | Status: DC | PRN
Start: 2023-06-18 — End: 2023-07-22

## 2023-06-22 ENCOUNTER — Telehealth: Payer: Self-pay | Admitting: Cardiovascular Disease

## 2023-06-22 MED ORDER — ENTRESTO 49-51 MG PO TABS: 1.0000 | ORAL_TABLET | Freq: Two times a day (BID) | ORAL | 3 refills | Status: AC

## 2023-06-22 NOTE — Telephone Encounter (Signed)
Pt's medication was sent to pt's pharmacy as requested. Confirmation received.

## 2023-06-22 NOTE — Telephone Encounter (Signed)
*  STAT* If patient is at the pharmacy, call can be transferred to refill team.   1. Which medications need to be refilled? (please list name of each medication and dose if known) sacubitril-valsartan (ENTRESTO) 49-51 MG    2. Would you like to learn more about the convenience, safety, & potential cost savings by using the [facility]?      3. Are you open to using the [facility] (Type [facility]. ).   4. Which pharmacy/location (including street and city if local pharmacy) is medication to be sent to? [name] - [place], [place] - [address]    5. Do they need a 30 day or 90 day supply? 90

## 2023-07-03 NOTE — Progress Notes (Signed)
 Date:  07/14/2023   ID:  HARPREET SIGNORE, DOB 1950/08/08, MRN [id number]  PCP:  Deeann Saint, MD  Cardiologist:   Eden Emms Electrophysiologist:  None   Evaluation Performed:  Follow-Up Visit  Chief Complaint:  CHF  History of Present Illness:    Beth Macias is a 73 y.o. 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       Echo done 11/16/18 reviewed EF 30-35% with moderate MR Small to moderate sized pericardial effusion Myovue done 12/13/18 normal perfusion EF 32%   Has had issues with hives since her 30's They got worse when she was started on pulmonary and CHF meds. Not clear culprit. But improved and not thought to be from heart meds. Seen 04/01/19 pharm D and entresto dose kept with increase in coreg. Patient does not like taking diuretic when she goes out Did not tolerate max dose Entresto   TTE done 05/23/20 improved EF to 50-55% with moderate Pericardial effusion no tamponade MR only trivial   TTE done 10/28/20 EF 55-60% no change in effusion  AV sclerosis  TTE 02/25/21 EF 60-65% speckled with moderate LVH AV sclerosis  TTE 04/16/23 EF 50-55% moderate effusion  Subsequent Tc Amyloid imaging equivocal grade one uptake ratio 1.99 and Cardiac MRI 03/11/23 EF 51% no amyloid or GAD uptake  Was in hospital 6/4-6  2022 for asthma flair She road to [place] With a friend and got exposed to some allergens CXR was ok BNP was 489 and BP was elevated   Eiress  grand daughter spends time with her daily and has a new grand daughter in [place] as well Went to [place] with grand kids December 2024     Past Medical History:  Diagnosis Date   Anxiety    Asthma    Back pain    Blood in stool    Chewing difficulty    CHF (congestive heart failure) (HCC)    Colon polyps    Constipation    Diabetes mellitus without complication (HCC)    Diverticulitis    Fibromyalgia    GERD (gastroesophageal reflux disease)    Gout    Hypercholesteremia     Hypertension    Joint pain    Lactose intolerance    Pneumonia    Pneumonia    Prediabetes    SOB (shortness of breath)    UTI (urinary tract infection)    Vitamin B 12 deficiency    Vitamin D deficiency    Past Surgical History:  Procedure Laterality Date   APPENDECTOMY     CESAREAN SECTION     x 3   LIPOMA EXCISION     MENISCUS REPAIR Left    OVARIAN CYST REMOVAL     OVARIAN CYST SURGERY       Current Meds  Medication Sig   albuterol (VENTOLIN HFA) 108 (90 Base) MCG/ACT inhaler Inhale 2 puffs into the lungs every 4 (four) hours as needed for wheezing or shortness of breath.   allopurinol (ZYLOPRIM) 100 MG tablet TAKE 1 TABLET(100 MG) BY MOUTH DAILY   B Complex-Minerals (ELDERTONIC PO) Take 15 mLs by mouth daily. Geritol   blood glucose meter kit and supplies KIT Dispense based on patient and insurance preference. Use up to four times daily as directed.   Blood Pressure Monitoring (BLOOD PRESSURE CUFF) MISC For daily monitoring as directed.   Calcium-Magnesium-Vitamin D (CALCIUM 1200+D3 PO) Take 600 mg by mouth daily.   cholecalciferol (VITAMIN  D) 25 MCG (1000 UNIT) tablet Take 1,000 Units by mouth at bedtime.    Cyanocobalamin ([employer] B12 GUMMIES PO) Take 1,000 mcg by mouth daily.   famotidine (PEPCID) 40 MG tablet Take 1 tablet (40 mg total) by mouth in the morning.   fluticasone (FLONASE) 50 MCG/ACT nasal spray Place 1 spray into both nostrils daily.   ipratropium-albuterol (DUONEB) 0.5-2.5 (3) MG/3ML SOLN Take 3 mLs by nebulization every 6 (six) hours as needed (shortness of breath/wheezing).   levocetirizine (XYZAL) 5 MG tablet Take 1 tablet (5 mg total) by mouth every evening.   montelukast (SINGULAIR) 10 MG tablet TAKE 1 TABLET EVERY DAY   ONETOUCH ULTRA test strip USE TO TEST BLOOD SUGAR UP TO FOUR TIMES DAILY   potassium chloride 20 MEQ/15ML (10%) SOLN Take 15 mLs (20 mEq total) by mouth daily as needed (with each dose of torsemide).   sacubitril-valsartan (ENTRESTO)  49-51 MG Take 1 tablet by mouth 2 (two) times daily.   tirzepatide [facility] 5 MG/0.5ML Pen Inject 5 mg into the skin once a week.   torsemide (DEMADEX) 20 MG tablet Take 1 tablet (20 mg total) by mouth daily as needed (swelling/fluid/weight gain).   triamcinolone cream (KENALOG) 0.1 % Apply topically 2 (two) times daily.   vitamin C (ASCORBIC ACID) 500 MG tablet Take 500 mg by mouth daily.     Allergies:   Strawberry (diagnostic), Farxiga [dapagliflozin], Penicillins, Amoxicillin, Gabapentin, and Tape   Social History   Tobacco Use   Smoking status: Former    Current packs/day: 0.00    Average packs/day: 0.3 packs/day for 9.0 years (2.3 ttl pk-yrs)    Types: Cigarettes    Start date: 05/27/1967    Quit date: 05/26/1976    Years since quitting: 47.1   Smokeless tobacco: Never  Vaping Use   Vaping status: Never Used  Substance Use Topics   Alcohol use: Never   Drug use: Never     Family Hx: The patient's family history includes AAA (abdominal aortic aneurysm) in her father; Colon cancer in her maternal uncle; Crohn's disease in her paternal aunt; Diabetes in her mother; Gout in her brother and mother; Heart attack in her brother; Heart disease in her father and mother; Heart failure in her father and mother; High Cholesterol in her mother; Hypertension in her brother, father, and mother; Obesity in her mother. There is no history of Breast cancer, Esophageal cancer, Pancreatic cancer, or Stomach cancer.  ROS:   Please see the history of present illness.     All other systems reviewed and are negative.   Prior CV studies:   The following studies were reviewed today:  Echo 11/16/18 Echo 05/23/20  Echo 10/28/20  Echo 02/25/21  Myovue 12/13/18  LE venous duplex 04/29/19   Labs/Other Tests and Data Reviewed:    EKG:  07/14/2023 SR rate 65 poor R wave progression 07/14/2023 SR rate 68 LAD poor R wave progression   Recent Labs: 08/01/2022: ALT 12; Hemoglobin 13.8; Platelets 257.0; TSH  0.71 03/04/2023: BUN 15; Creatinine, Ser 0.78; Potassium 3.4; Sodium 145   Recent Lipid Panel Lab Results  Component Value Date/Time   CHOL 243 (H) 03/17/2023 10:55 AM   TRIG 146 03/17/2023 10:55 AM   HDL 68 03/17/2023 10:55 AM   CHOLHDL 4 08/01/2022 10:37 AM   LDLCALC 149 (H) 03/17/2023 10:55 AM    Wt Readings from Last 3 Encounters:  07/14/23 209 lb 6.4 oz (95 kg)  07/07/23 208 lb (94.3 kg)  06/15/23  209 lb (94.8 kg)     Objective:    Vital Signs:  BP (!) 164/92   Pulse 82   Ht 5\' 1"  (1.549 m)   Wt 209 lb 6.4 oz (95 kg)   SpO2 96%   BMI 39.57 kg/m    Telephone no exam     ASSESSMENT & PLAN:    CHF:  Non ischemic DCM continue coreg, and entresto On demedex ( hives when on lasix/aldactone) she takes her diuretic when not going out for the day  No changes for now EF improved by TTE 04/16/23  50-55%  Amyloid scan 01/23/23 not definitely positive only grade 1 uptake ratio 1.9 F/U MRI 03/11/23 normal nulling with no GAD uptake negative for amyloid. LVEF 51%  Pulmonary:  F/u Wert continue symbicort and pro Air  LE venous duplex 04/29/19 showed no DVT  GERD:  Continue prilosec   Pericardial Effusion:  Asymptomatic  stable over serial echos most recently 04/16/23 deemed moderate with no tamponade     Medication Adjustments/Labs and Tests Ordered: Current medicines are reviewed at length with the patient today.  Concerns regarding medicines are outlined above.   Tests Ordered:  None  Medication Changes:  None      F/U in in a year   Signed, Charlton Haws, MD  07/14/2023 9:52 AM    [facility]

## 2023-07-07 ENCOUNTER — Encounter (INDEPENDENT_AMBULATORY_CARE_PROVIDER_SITE_OTHER): Payer: Self-pay | Admitting: Internal Medicine

## 2023-07-07 ENCOUNTER — Ambulatory Visit (INDEPENDENT_AMBULATORY_CARE_PROVIDER_SITE_OTHER): Payer: Medicare Other | Admitting: Internal Medicine

## 2023-07-07 ENCOUNTER — Telehealth (INDEPENDENT_AMBULATORY_CARE_PROVIDER_SITE_OTHER): Payer: Self-pay | Admitting: Internal Medicine

## 2023-07-07 VITALS — BP 136/86 | Temp 98.1°F | Ht 59.0 in | Wt 208.0 lb

## 2023-07-07 DIAGNOSIS — Z6841 Body Mass Index (BMI) 40.0 and over, adult: Secondary | ICD-10-CM

## 2023-07-07 DIAGNOSIS — E1159 Type 2 diabetes mellitus with other circulatory complications: Secondary | ICD-10-CM | POA: Diagnosis not present

## 2023-07-07 DIAGNOSIS — Z723 Lack of physical exercise: Secondary | ICD-10-CM | POA: Diagnosis not present

## 2023-07-07 DIAGNOSIS — E669 Obesity, unspecified: Secondary | ICD-10-CM | POA: Diagnosis not present

## 2023-07-07 DIAGNOSIS — E1169 Type 2 diabetes mellitus with other specified complication: Secondary | ICD-10-CM | POA: Diagnosis not present

## 2023-07-07 DIAGNOSIS — I152 Hypertension secondary to endocrine disorders: Secondary | ICD-10-CM | POA: Diagnosis not present

## 2023-07-07 DIAGNOSIS — Z7985 Long-term (current) use of injectable non-insulin antidiabetic drugs: Secondary | ICD-10-CM

## 2023-07-07 MED ORDER — TIRZEPATIDE 5 MG/0.5ML ~~LOC~~ SOAJ
5.0000 mg | SUBCUTANEOUS | 0 refills | Status: DC
Start: 2023-07-07 — End: 2023-08-04

## 2023-07-07 NOTE — Assessment & Plan Note (Signed)
Blood pressure control improving.  She has a follow-up with her cardiologist in about 4 weeks.  Continue current regimen.

## 2023-07-07 NOTE — Assessment & Plan Note (Signed)
Last hemoglobin A1c was 6.8.  She is currently on Mounjaro 5 mg once a week and will continue at this dose.  Continue with medically supervised weight management plan

## 2023-07-07 NOTE — Assessment & Plan Note (Signed)
See obesity treatment plan

## 2023-07-07 NOTE — Assessment & Plan Note (Signed)
Patient counseled on the risks associated with sedentarism and benefits of regular physical activity.

## 2023-07-07 NOTE — Progress Notes (Signed)
Office: [phone]  /  Fax: [phone]  Weight Summary And Biometrics  Vitals Temp: 98.1 F (36.7 C) BP: 136/86 SpO2: 97 %   Anthropometric Measurements Height: 4\' 11"  (1.499 m) Weight: 208 lb (94.3 kg) BMI (Calculated): 41.99 Weight at Last Visit: 209 lb Weight Lost Since Last Visit: 1 lb Weight Gained Since Last Visit: 0 lb Starting Weight: 220 lb Total Weight Loss (lbs): 12 lb (5.443 kg) Peak Weight: 230 lb   Body Composition  Body Fat %: 55 % Fat Mass (lbs): 114.6 lbs Muscle Mass (lbs): 88.8 lbs Visceral Fat Rating : 20    RMR: [id number]  Today's Visit #: 5  Starting Date: 03/17/23   Subjective   Chief Complaint: Obesity  Beth Macias is here to discuss her progress with her obesity treatment plan. She is on the the Category 2 Plan and states she is following her eating plan approximately 80 % of the time. She states she is not exercising.  Weight Progress Since Last Visit:  Since last office visit she has lost 1 pounds. She reports good adherence to reduced calorie nutritional plan. She has been working on reading food labels, not skipping meals, increasing protein intake at every meal, drinking more water, making healthier choices, reducing portion sizes, and incorporating more whole foods   Challenges affecting patient progress: low volume of physical activity at present , medical comorbidities, and menopause.   Orexigenic Control: Denies problems with appetite and hunger signals.  Denies problems with satiety and satiation.  Denies problems with eating patterns and portion control.  Denies abnormal cravings. Denies feeling deprived or restricted.   Pharmacotherapy for weight management: She is currently taking Monjauro with diabetes as the primary indication with adequate clinical response  and without side effects..   Assessment and Plan   Treatment Plan For Obesity:  Recommended Dietary Goals  Beth Macias is currently in the action stage of  change. As such, her goal is to continue weight management plan. She has agreed to: continue current plan  Behavioral Health and Counseling  We discussed the following behavioral modification strategies today: continue to work on maintaining a reduced calorie state, getting the recommended amount of protein, incorporating whole foods, making healthy choices, staying well hydrated and practicing mindfulness when eating..  Additional education and resources provided today: Handout on how to make a protein smoothie   Recommended Physical Activity Goals  Beth Macias has been advised to work up to 150 minutes of moderate intensity aerobic activity a week and strengthening exercises 2-3 times per week for cardiovascular health, weight loss maintenance and preservation of muscle mass.   She has agreed to :  Think about enjoyable ways to increase daily physical activity and overcoming barriers to exercise and Increase physical activity in their day and reduce sedentary time (increase NEAT).  She has a Photographer and access to an aquatic program but has not been feeling like she wants to exercise  Pharmacotherapy  We discussed various medication options to help Beth Macias with her weight loss efforts and we both agreed to : adequate clinical response to current dose, continue current regimen and not recommend further increases in GLP-1 as it may result in restrictive eating.  Associated Conditions Impacted by Obesity Treatment  Type 2 diabetes mellitus with obesity (HCC) Assessment & Plan: Last hemoglobin A1c was 6.8.  She is currently on Mounjaro 5 mg once a week and will continue at this dose.  Continue with medically supervised weight management plan  Orders: -  Tirzepatide; Inject 5 mg into the skin once a week.  Dispense: 2 mL; Refill: 0  Generalized obesity Assessment & Plan: See obesity treatment plan  Orders: -     Tirzepatide; Inject 5 mg into the skin once a week.  Dispense: 2  mL; Refill: 0  Hypertension associated with type 2 diabetes mellitus (HCC) Assessment & Plan: Blood pressure control improving.  She has a follow-up with her cardiologist in about 4 weeks.  Continue current regimen.   Physically inactive Assessment & Plan: Patient counseled on the risks associated with sedentarism and benefits of regular physical activity.      Objective   Physical Exam:  Blood pressure 136/86, temperature 98.1 F (36.7 C), height 4\' 11"  (1.499 m), weight 208 lb (94.3 kg), SpO2 97%. Body mass index is 42.01 kg/m.  General: She is overweight, cooperative, alert, well developed, and in no acute distress. PSYCH: Has normal mood, affect and thought process.   HEENT: EOMI, sclerae are anicteric. Lungs: Normal breathing effort, no conversational dyspnea. Extremities: No edema.  Neurologic: No gross sensory or motor deficits. No tremors or fasciculations noted.    Diagnostic Data Reviewed:  BMET    Component Value Date/Time   NA 145 (H) 03/04/2023 1102   K 3.4 (L) 03/04/2023 1102   CL 103 03/04/2023 1102   CO2 27 03/04/2023 1102   GLUCOSE 119 (H) 03/04/2023 1102   GLUCOSE 103 (H) 08/01/2022 1037   BUN 15 03/04/2023 1102   CREATININE 0.78 03/04/2023 1102   CALCIUM 10.1 03/04/2023 1102   GFRNONAA 60 (L) 11/06/2020 1114   GFRAA 86 02/29/2020 1010   Lab Results  Component Value Date   HGBA1C 6.8 (H) 03/17/2023   HGBA1C 7.2 (H) 06/29/2019   Lab Results  Component Value Date   INSULIN 19.7 03/17/2023   Lab Results  Component Value Date   TSH 0.71 08/01/2022   CBC    Component Value Date/Time   WBC 6.3 08/01/2022 1037   RBC 4.81 08/01/2022 1037   HGB 13.8 08/01/2022 1037   HCT 38.2 03/04/2023 1102   PLT 257.0 08/01/2022 1037   MCV 86.7 08/01/2022 1037   MCH 28.0 10/29/2020 0021   MCHC 33.0 08/01/2022 1037   RDW 14.5 08/01/2022 1037   Iron Studies No results found for: "IRON", "TIBC", "FERRITIN", "IRONPCTSAT" Lipid Panel     Component  Value Date/Time   CHOL 243 (H) 03/17/2023 1055   TRIG 146 03/17/2023 1055   HDL 68 03/17/2023 1055   CHOLHDL 4 08/01/2022 1037   VLDL 26.8 08/01/2022 1037   LDLCALC 149 (H) 03/17/2023 1055   Hepatic Function Panel     Component Value Date/Time   PROT 6.6 04/20/2023 1136   ALBUMIN 3.9 08/01/2022 1037   ALBUMIN 3.9 02/29/2020 1010   AST 15 08/01/2022 1037   ALT 12 08/01/2022 1037   ALKPHOS 55 08/01/2022 1037   BILITOT 0.8 08/01/2022 1037   BILITOT 0.7 02/29/2020 1010      Component Value Date/Time   TSH 0.71 08/01/2022 1037   Nutritional Lab Results  Component Value Date   VD25OH 58.4 03/17/2023    Follow-Up   Return in about 4 weeks (around 08/04/2023) for For Weight Mangement with Dr. Rikki Spearing.Marland Kitchen She was informed of the importance of frequent follow up visits to maximize her success with intensive lifestyle modifications for her multiple health conditions.  Attestation Statement   Reviewed by clinician on day of visit: allergies, medications, problem list, medical history, surgical history, family history,  social history, and previous encounter notes.     Worthy Rancher, MD

## 2023-07-07 NOTE — Telephone Encounter (Signed)
Hello!  Ms.Mcaffee's daughter (who is a physician) advised her to request a referral to a nutritionist. She wants to know if you can do that for her.   Thank you!

## 2023-07-14 ENCOUNTER — Telehealth: Payer: Self-pay | Admitting: Pharmacy Technician

## 2023-07-14 ENCOUNTER — Ambulatory Visit: Payer: Medicare Other | Attending: Cardiovascular Disease | Admitting: Cardiovascular Disease

## 2023-07-14 VITALS — BP 164/92 | HR 82 | Ht 61.0 in | Wt 209.4 lb

## 2023-07-14 DIAGNOSIS — E782 Mixed hyperlipidemia: Secondary | ICD-10-CM | POA: Diagnosis not present

## 2023-07-14 DIAGNOSIS — I420 Dilated cardiomyopathy: Secondary | ICD-10-CM

## 2023-07-14 DIAGNOSIS — I10 Essential (primary) hypertension: Secondary | ICD-10-CM | POA: Diagnosis not present

## 2023-07-14 DIAGNOSIS — I3139 Other pericardial effusion (noninflammatory): Secondary | ICD-10-CM

## 2023-07-14 NOTE — Patient Instructions (Signed)
 Medication Instructions:  Your physician recommends that you continue on your current medications as directed. Please refer to the Current Medication list given to you today.  *If you need a refill on your cardiac medications before your next appointment, please call your pharmacy*  Lab Work: If you have labs (blood work) drawn today and your tests are completely normal, you will receive your results only by: MyChart Message (if you have MyChart) OR A paper copy in the mail If you have any lab test that is abnormal or we need to change your treatment, we will call you to review the results.  Follow-Up: At [facility], you and your health needs are our priority.  As part of our continuing mission to provide you with exceptional heart care, we have created designated Provider Care Teams.  These Care Teams include your primary Cardiologist (physician) and Advanced Practice Providers (APPs -  Physician Assistants and Nurse Practitioners) who all work together to provide you with the care you need, when you need it.  We recommend signing up for the patient portal called "MyChart".  Sign up information is provided on this After Visit Summary.  MyChart is used to connect with patients for Virtual Visits (Telemedicine).  Patients are able to view lab/test results, encounter notes, upcoming appointments, etc.  Non-urgent messages can be sent to your provider as well.   To learn more about what you can do with MyChart, go to [email or web address].    Your next appointment:   6 month(s)  Provider:   Charlton Haws, MD     Other Instructions

## 2023-07-14 NOTE — Telephone Encounter (Signed)
 Hello, We just received the prescriber signed form in the faxes for assistance for novart[employer] for entresto but does the patient have the patient part? We did not send her out the forms, do we need to? Thank you

## 2023-07-14 NOTE — Telephone Encounter (Signed)
 She said she was going to work on her part today. You might want to call her and make sure she doesn't have any questions.

## 2023-07-16 ENCOUNTER — Other Ambulatory Visit: Payer: Self-pay | Admitting: Family Medicine

## 2023-07-16 DIAGNOSIS — E876 Hypokalemia: Secondary | ICD-10-CM

## 2023-07-20 ENCOUNTER — Encounter: Payer: Self-pay | Admitting: Podiatry

## 2023-07-20 ENCOUNTER — Ambulatory Visit: Payer: Medicare Other | Admitting: Podiatry

## 2023-07-20 DIAGNOSIS — M79676 Pain in unspecified toe(s): Secondary | ICD-10-CM

## 2023-07-20 DIAGNOSIS — E119 Type 2 diabetes mellitus without complications: Secondary | ICD-10-CM | POA: Diagnosis not present

## 2023-07-20 DIAGNOSIS — B351 Tinea unguium: Secondary | ICD-10-CM

## 2023-07-20 DIAGNOSIS — D2371 Other benign neoplasm of skin of right lower limb, including hip: Secondary | ICD-10-CM

## 2023-07-20 NOTE — Progress Notes (Signed)
 She presents today chief complaint of painful elongated toenails bilaterally.  She also has benign skin lesions.  Objective: Toenails are long thick yellow dystrophic onychomycotic pulses remain palpable benign skin lesions noncomplicated plantar aspect forefoot bilateral.  Assessment: Pain limb secondary to elongated thickened mycotic nails and benign skin lesions.  Plan: Debridement of benign skin lesion debridement of toenails 1 through 5 bilateral

## 2023-07-22 ENCOUNTER — Telehealth (INDEPENDENT_AMBULATORY_CARE_PROVIDER_SITE_OTHER): Payer: Self-pay | Admitting: Internal Medicine

## 2023-07-22 NOTE — Telephone Encounter (Signed)
 The clinical pharmacist Kathlene November from [facility] called about this patient. He stated that her record shows she is diabetic but there is no statin on file and he needs a reason as to why submitted. He stated he could be reached at [phone] ext. [phone]. Please follow up with him.

## 2023-07-23 NOTE — Telephone Encounter (Signed)
 Spoke to [place] and advised that pt refused statin.

## 2023-07-27 ENCOUNTER — Other Ambulatory Visit: Payer: Self-pay | Admitting: Family Medicine

## 2023-07-27 DIAGNOSIS — Z0000 Encounter for general adult medical examination without abnormal findings: Secondary | ICD-10-CM

## 2023-07-28 NOTE — Telephone Encounter (Signed)
Left message for pt to follow up.

## 2023-07-29 NOTE — Telephone Encounter (Signed)
 Pt called back and said she sent her portion herself to the patient assistance. I faxed the dr portion 07/29/23 4:35pm

## 2023-07-30 ENCOUNTER — Other Ambulatory Visit: Payer: Self-pay | Admitting: Family Medicine

## 2023-07-30 DIAGNOSIS — R053 Chronic cough: Secondary | ICD-10-CM

## 2023-07-30 NOTE — Telephone Encounter (Signed)
 Copied from CRM [id number]. Topic: Clinical - Medication Refill >> Jul 30, 2023 10:01 AM Elizebeth Brooking wrote: Most Recent Primary Care Visit:  Provider: Worthy Rancher  Department: [facility]  Visit Type: FOLLOW UP  Date: 07/07/2023  Medication: famotidine (PEPCID) 40 MG tablet  Has the patient contacted their pharmacy? Yes (Agent: If no, request that the patient contact the pharmacy for the refill. If patient does not wish to contact the pharmacy document the reason why and proceed with request.) (Agent: If yes, when and what did the pharmacy advise?)  Is this the correct pharmacy for this prescription? Yes If no, delete pharmacy and type the correct one.  This is the patient's preferred pharmacy:  [name] - [place], [place] - [address] AT [facility] [address] [facility] [facility] [address] [place] [address] Phone: [phone] Fax: [phone]   Has the prescription been filled recently? No  Is the patient out of the medication? Yes  Has the patient been seen for an appointment in the last year OR does the patient have an upcoming appointment? Yes  Can we respond through MyChart? Yes  Agent: Please be advised that Rx refills may take up to 3 business days. We ask that you follow-up with your pharmacy.

## 2023-07-31 MED ORDER — FAMOTIDINE 40 MG PO TABS: 40.0000 mg | ORAL_TABLET | Freq: Every morning | ORAL | 3 refills | Status: DC

## 2023-08-03 ENCOUNTER — Encounter: Payer: Self-pay | Admitting: Family Medicine

## 2023-08-03 ENCOUNTER — Ambulatory Visit: Payer: Medicare PPO | Admitting: Family Medicine

## 2023-08-03 VITALS — BP 134/86 | HR 78 | Temp 98.6°F | Ht 61.0 in | Wt 210.6 lb

## 2023-08-03 DIAGNOSIS — R053 Chronic cough: Secondary | ICD-10-CM

## 2023-08-03 DIAGNOSIS — E119 Type 2 diabetes mellitus without complications: Secondary | ICD-10-CM

## 2023-08-03 DIAGNOSIS — Z0000 Encounter for general adult medical examination without abnormal findings: Secondary | ICD-10-CM | POA: Diagnosis not present

## 2023-08-03 DIAGNOSIS — Z7985 Long-term (current) use of injectable non-insulin antidiabetic drugs: Secondary | ICD-10-CM

## 2023-08-03 DIAGNOSIS — I10 Essential (primary) hypertension: Secondary | ICD-10-CM | POA: Diagnosis not present

## 2023-08-03 DIAGNOSIS — E782 Mixed hyperlipidemia: Secondary | ICD-10-CM

## 2023-08-03 DIAGNOSIS — E66812 Obesity, class 2: Secondary | ICD-10-CM

## 2023-08-03 DIAGNOSIS — Z5320 Procedure and treatment not carried out because of patient's decision for unspecified reasons: Secondary | ICD-10-CM

## 2023-08-03 DIAGNOSIS — I5032 Chronic diastolic (congestive) heart failure: Secondary | ICD-10-CM

## 2023-08-03 DIAGNOSIS — Z6839 Body mass index (BMI) 39.0-39.9, adult: Secondary | ICD-10-CM

## 2023-08-03 LAB — LIPID PANEL
Cholesterol: 184 mg/dL (ref 0–200)
HDL: 52.7 mg/dL (ref >39.00)
LDL Cholesterol: 110 mg/dL — ABNORMAL HIGH (ref 0–99)
NonHDL: 130.85
Total CHOL/HDL Ratio: 3
Triglycerides: 103 mg/dL (ref 0.0–149.0)
VLDL: 20.6 mg/dL (ref 0.0–40.0)

## 2023-08-03 LAB — COMPREHENSIVE METABOLIC PANEL
ALT: 8 U/L (ref 0–35)
AST: 15 U/L (ref 0–37)
Albumin: 4.1 g/dL (ref 3.5–5.2)
Alkaline Phosphatase: 65 U/L (ref 39–117)
BUN: 23 mg/dL (ref 6–23)
CO2: 28 meq/L (ref 19–32)
Calcium: 10.2 mg/dL (ref 8.4–10.5)
Chloride: 105 meq/L (ref 96–112)
Creatinine, Ser: 0.87 mg/dL (ref 0.40–1.20)
GFR: 65.96 mL/min (ref >60.00)
Glucose, Bld: 114 mg/dL — ABNORMAL HIGH (ref 70–99)
Potassium: 3.3 meq/L — ABNORMAL LOW (ref 3.5–5.1)
Sodium: 142 meq/L (ref 135–145)
Total Bilirubin: 0.6 mg/dL (ref 0.2–1.2)
Total Protein: 6.6 g/dL (ref 6.0–8.3)

## 2023-08-03 LAB — CBC WITH DIFFERENTIAL/PLATELET
Basophils Absolute: 0.1 10*3/uL (ref 0.0–0.1)
Basophils Relative: 1.2 % (ref 0.0–3.0)
Eosinophils Absolute: 0.2 10*3/uL (ref 0.0–0.7)
Eosinophils Relative: 4.4 % (ref 0.0–5.0)
HCT: 38.7 % (ref 36.0–46.0)
Hemoglobin: 13 g/dL (ref 12.0–15.0)
Lymphocytes Relative: 25 % (ref 12.0–46.0)
Lymphs Abs: 1.2 10*3/uL (ref 0.7–4.0)
MCHC: 33.6 g/dL (ref 30.0–36.0)
MCV: 85.7 fl (ref 78.0–100.0)
Monocytes Absolute: 0.3 10*3/uL (ref 0.1–1.0)
Monocytes Relative: 6.7 % (ref 3.0–12.0)
Neutro Abs: 3 10*3/uL (ref 1.4–7.7)
Neutrophils Relative %: 62.7 % (ref 43.0–77.0)
Platelets: 269 10*3/uL (ref 150.0–400.0)
RBC: 4.51 Mil/uL (ref 3.87–5.11)
RDW: 13.9 % (ref 11.5–15.5)
WBC: 4.7 10*3/uL (ref 4.0–10.5)

## 2023-08-03 LAB — MICROALBUMIN / CREATININE URINE RATIO
Creatinine,U: 146.5 mg/dL
Microalb Creat Ratio: 42.8 mg/g — ABNORMAL HIGH (ref 0.0–30.0)
Microalb, Ur: 6.3 mg/dL — ABNORMAL HIGH (ref 0.0–1.9)

## 2023-08-03 LAB — TSH: TSH: 0.47 u[IU]/mL (ref 0.35–5.50)

## 2023-08-03 LAB — HEMOGLOBIN A1C: Hgb A1c MFr Bld: 5.7 % (ref 4.6–6.5)

## 2023-08-03 MED ORDER — FAMOTIDINE 40 MG PO TABS: 40.0000 mg | ORAL_TABLET | Freq: Every morning | ORAL | 3 refills | Status: AC

## 2023-08-03 NOTE — Progress Notes (Signed)
 Established Patient Office Visit   Subjective  Patient ID: Beth Macias, female    DOB: [birth year]  Age: 73 y.o. MRN: [id number]  Chief Complaint  Patient presents with   Annual Exam    Pt 73 year old female seen for CPE.  Patient states she is doing well overall.  Continuing to try to lose weight.  Seen by weight management.  Patient taking Mounjaro 5 mg weekly.  Had follow-up with cardiology for CHF.  BP better controlled.  Patient notes dry eyes since cataract surgery.  Using OTC eyedrops without improvement.  Does not want to see eye doctor again.   Patient Active Problem List   Diagnosis Date Noted   Physically inactive 07/07/2023   Other specified counseling 04/17/2023   At high risk for cardiovascular disease 03/31/2023   Generalized obesity starting BMI  44.5 03/17/2023   Suspected sleep apnea 03/17/2023   Chronic heart failure with preserved ejection fraction (HCC) 01/12/2023   Hypertension associated with type 2 diabetes mellitus (HCC) 01/12/2023   Hyperlipidemia associated with type 2 diabetes mellitus (HCC) 01/12/2023   Acute on chronic heart failure with preserved ejection fraction (HFpEF) (HCC) 10/27/2020   Hypertensive urgency 10/27/2020   Type 2 diabetes mellitus with obesity (HCC) 10/27/2020   CHF (congestive heart failure) (HCC) 12/10/2018   Mild intermittent asthma without complication 06/30/2018   Environmental and seasonal allergies 06/30/2018   Diabetes (HCC) 04/04/2018   Morbid obesity with BMI of 40.0-44.9, adult (HCC) 02/18/2016   Extrinsic asthma without complication 06/19/2015   Cubital tunnel syndrome, right 07/06/2014   Hypokalemia 08/16/2013   Past Medical History:  Diagnosis Date   Anxiety    Asthma    Back pain    Blood in stool    Chewing difficulty    CHF (congestive heart failure) (HCC)    Colon polyps    Constipation    Diabetes mellitus without complication (HCC)    Diverticulitis    Fibromyalgia    GERD (gastroesophageal  reflux disease)    Gout    Hypercholesteremia    Hypertension    Joint pain    Lactose intolerance    Pneumonia    Pneumonia    Prediabetes    SOB (shortness of breath)    UTI (urinary tract infection)    Vitamin B 12 deficiency    Vitamin D deficiency    Past Surgical History:  Procedure Laterality Date   APPENDECTOMY     CESAREAN SECTION     x 3   LIPOMA EXCISION     MENISCUS REPAIR Left    OVARIAN CYST REMOVAL     OVARIAN CYST SURGERY     Social History   Tobacco Use   Smoking status: Former    Current packs/day: 0.00    Average packs/day: 0.3 packs/day for 9.0 years (2.3 ttl pk-yrs)    Types: Cigarettes    Start date: 05/27/1967    Quit date: 05/26/1976    Years since quitting: 47.2   Smokeless tobacco: Never  Vaping Use   Vaping status: Never Used  Substance Use Topics   Alcohol use: Never   Drug use: Never   Family History  Problem Relation Age of Onset   Heart failure Mother    Gout Mother    Hypertension Mother    Diabetes Mother    High Cholesterol Mother    Heart disease Mother    Obesity Mother    Heart failure Father    Hypertension Father  AAA (abdominal aortic aneurysm) Father    Heart disease Father    Gout Brother    Hypertension Brother    Heart attack Brother    Colon cancer Maternal Uncle    Crohn's disease Paternal Aunt    Breast cancer Neg Hx    Esophageal cancer Neg Hx    Pancreatic cancer Neg Hx    Stomach cancer Neg Hx    Allergies  Allergen Reactions   Strawberry (Diagnostic) Anaphylaxis and Other (See Comments)    hives   Farxiga [Dapagliflozin] Other (See Comments)    Had HAs, weakness, rhinorrhea, jitteriness, nasal congestion, nocturia, yeast infections, memory changes, irritability.   Penicillins Itching    Has patient had a PCN reaction causing immediate rash, facial/tongue/throat swelling, SOB or lightheadedness with hypotension: No Has patient had a PCN reaction causing severe rash involving mucus membranes or skin  necrosis: No Has patient had a PCN reaction that required hospitalization: Unknown Has patient had a PCN reaction occurring within the last 10 years: No If all of the above answers are "NO", then may proceed with Cephalosporin use.   Amoxicillin Other (See Comments)   Gabapentin Itching, Rash and Other (See Comments)    Incoherent   Tape Other (See Comments)    Blisters      ROS Negative unless stated above    Objective:     BP 134/86 (BP Location: Left Arm, Patient Position: Sitting, Cuff Size: Large)   Pulse 78   Temp 98.6 F (37 C) (Oral)   Ht 5\' 1"  (1.549 m)   Wt 210 lb 9.6 oz (95.5 kg)   SpO2 93%   BMI 39.79 kg/m  BP Readings from Last 3 Encounters:  08/03/23 134/86  07/14/23 (!) 164/92  07/07/23 136/86   Wt Readings from Last 3 Encounters:  08/03/23 210 lb 9.6 oz (95.5 kg)  07/14/23 209 lb 6.4 oz (95 kg)  07/07/23 208 lb (94.3 kg)      Physical Exam Constitutional:      Appearance: Normal appearance.  HENT:     Head: Normocephalic and atraumatic.     Right Ear: Tympanic membrane, ear canal and external ear normal.     Left Ear: Tympanic membrane, ear canal and external ear normal.     Nose: Nose normal.     Mouth/Throat:     Mouth: Mucous membranes are moist.     Pharynx: No oropharyngeal exudate or posterior oropharyngeal erythema.  Eyes:     General: No scleral icterus.    Extraocular Movements: Extraocular movements intact.     Conjunctiva/sclera: Conjunctivae normal.     Pupils: Pupils are equal, round, and reactive to light.  Neck:     Thyroid: No thyromegaly.  Cardiovascular:     Rate and Rhythm: Normal rate and regular rhythm.     Pulses: Normal pulses.     Heart sounds: Normal heart sounds. No murmur heard.    No friction rub.  Pulmonary:     Effort: Pulmonary effort is normal.     Breath sounds: Normal breath sounds. No wheezing, rhonchi or rales.  Abdominal:     General: Bowel sounds are normal.     Palpations: Abdomen is soft.      Tenderness: There is no abdominal tenderness.  Musculoskeletal:        General: No deformity. Normal range of motion.  Lymphadenopathy:     Cervical: No cervical adenopathy.  Skin:    General: Skin is warm and dry.  Findings: No lesion.  Neurological:     General: No focal deficit present.     Mental Status: She is alert and oriented to person, place, and time.  Psychiatric:        Mood and Affect: Mood normal.        Thought Content: Thought content normal.     No results found for any visits on 08/03/23.    Assessment & Plan:  Well adult exam -     CBC with Differential/Platelet; Future -     Comprehensive metabolic panel; Future -     Hemoglobin A1c; Future -     TSH; Future  Type 2 diabetes mellitus without complication, without long-term current use of insulin (HCC) -controlled -     Microalbumin / creatinine urine ratio -     Hemoglobin A1c; Future  Essential hypertension - -     Comprehensive metabolic panel; Future -     TSH; Future  Mixed hyperlipidemia -     Comprehensive metabolic panel; Future -     Lipid panel; Future  Chronic cough -like 2/2 GERD/silent reflux -     Famotidine; Take 1 tablet (40 mg total) by mouth in the morning.  Dispense: 90 tablet; Refill: 3  Class 2 severe obesity with serious comorbidity and body mass index (BMI) of 39.0 to 39.9 in adult, unspecified obesity type (HCC) -Body mass index is 39.79 kg/m. -Continue increasing physical activity and lifestyle modifications -Continue Mounjaro 5 mg weekly -Continue follow-up with weight management  Chronic heart failure with preserved ejection fraction (HCC) -Euvolemic -Continue diet changes/lifestyle modifications -Continue current medications including Coreg 25 mg twice daily, Entresto 49-51 mg -Torsemide 20 mg as needed for edema -Continue follow-up with cardiology  Statin declined  Age-appropriate health screenings discussed.  Obtain labs.  Immunizations reviewed.   Consider Shingrix.  Mammogram up-to-date done 08/15/2022.  Colonoscopy done 04/29/2018.  Foot exam due this visit.  Patient encouraged to schedule eye exam.  Hemoglobin A1c 6.8% on 03/17/2023.  Continue Mounjaro 5 mg weekly.  Did not tolerate Marcelline Deist in the past.  Return in about 4 months (around 12/03/2023).   Deeann Saint, MD

## 2023-08-04 ENCOUNTER — Ambulatory Visit (INDEPENDENT_AMBULATORY_CARE_PROVIDER_SITE_OTHER): Payer: Medicare Other | Admitting: Internal Medicine

## 2023-08-04 ENCOUNTER — Encounter (INDEPENDENT_AMBULATORY_CARE_PROVIDER_SITE_OTHER): Payer: Self-pay | Admitting: Internal Medicine

## 2023-08-04 VITALS — BP 138/86 | HR 75 | Temp 97.9°F | Ht 59.0 in | Wt 204.0 lb

## 2023-08-04 DIAGNOSIS — R29818 Other symptoms and signs involving the nervous system: Secondary | ICD-10-CM

## 2023-08-04 DIAGNOSIS — E669 Obesity, unspecified: Secondary | ICD-10-CM

## 2023-08-04 DIAGNOSIS — E1169 Type 2 diabetes mellitus with other specified complication: Secondary | ICD-10-CM | POA: Diagnosis not present

## 2023-08-04 DIAGNOSIS — Z7985 Long-term (current) use of injectable non-insulin antidiabetic drugs: Secondary | ICD-10-CM

## 2023-08-04 DIAGNOSIS — Z5320 Procedure and treatment not carried out because of patient's decision for unspecified reasons: Secondary | ICD-10-CM | POA: Diagnosis not present

## 2023-08-04 DIAGNOSIS — Z6841 Body Mass Index (BMI) 40.0 and over, adult: Secondary | ICD-10-CM

## 2023-08-04 MED ORDER — TIRZEPATIDE 5 MG/0.5ML ~~LOC~~ SOAJ: 5.0000 mg | SUBCUTANEOUS | 0 refills | Status: DC

## 2023-08-04 NOTE — Assessment & Plan Note (Addendum)
 We completed screening today.  She has symptoms of disordered sleep breathing suspicious for sleep apnea but she is not interested in having sleep study nor using CPAP therapy we will therefore hold off polysomnography.  She will continue on medically supervised weight management plan inclusive of GLP-1.  Losing 15% of body weight may reduce AHI.  Patient provided with information on the risk associated with untreated sleep apnea.

## 2023-08-04 NOTE — Assessment & Plan Note (Signed)
 Patient has lost those far 23 pounds with preservation of muscle mass.  She is currently on Mounjaro for diabetes management as well as weight management with good clinical response and no adverse effects.  She is working on increasing physical activity.  She is not skipping meals and is maintaining adequate protein intake.  We discussed about maintaining adequate hydration she is also incorporating more whole foods.  I also explained to her the reason why not increasing her GLP-1 as she is losing weight at a healthy rate of 1 pound per week which this was recommended for her age.

## 2023-08-04 NOTE — Assessment & Plan Note (Signed)
 I reviewed most recent cholesterol we also discussed the benefits and risk associated with statin therapy.  She declines statin therapy at present time.  Her LDL cholesterol has improved but still not at goal.  We also discussed diabetes as a risk factor for coronary artery disease and stroke.

## 2023-08-04 NOTE — Assessment & Plan Note (Signed)
 Reviewed most recent labs her A1c is now 5.7 from 6.8 she denies any signs or symptoms of hypoglycemia or hyperglycemia.  She is currently on Mounjaro 5 mg once a week without any adverse effects we will continue current dose.

## 2023-08-04 NOTE — Progress Notes (Signed)
 Office: [phone]  /  Fax: [phone]  Weight Summary And Biometrics  Vitals Temp: 97.9 F (36.6 C) BP: 138/86 Pulse Rate: 75 SpO2: 96 %   Anthropometric Measurements Height: 4\' 11"  (1.499 m) Weight: 204 lb (92.5 kg) BMI (Calculated): 41.18 Weight at Last Visit: 208 lb Weight Lost Since Last Visit: 4 lb Weight Gained Since Last Visit: 0 Starting Weight: 220 lb Total Weight Loss (lbs): 16 lb (7.258 kg) Peak Weight: 230 lb   Body Composition  Body Fat %: 53.6 % Fat Mass (lbs): 109.4 lbs Muscle Mass (lbs): 89.8 lbs Visceral Fat Rating : 19    RMR: [id number]  Today's Visit #: 6  Starting Date: 03/17/23   Subjective   Chief Complaint: Obesity  Interval History Discussed the use of AI scribe software for clinical note transcription with the patient, who gave verbal consent to proceed.  History of Present Illness   The patient is a 73 year old with obesity and type 2 diabetes who presents for medical weight management.  She has lost 23 pounds since April 2024, with her current weight at 204 pounds, down from 227 pounds. This weight loss is attributed to following a category two plan, eating more whole foods, getting the recommended amount of protein, and occasionally using a meal replacement. She maintains adequate hydration occasionally. Although she is not currently exercising, she plans to increase physical activity.  Her type 2 diabetes is well-controlled, with a hemoglobin A1c of 5.7, down from 6.8 in October. This improvement is attributed to weight loss and dietary changes. No symptoms of hypoglycemia such as sweating or shakiness, except when skipping meals.  She experiences poor sleep quality, getting about four to five hours per night, and increased stress levels. She attributes her insomnia to her 65.60-year-old blind dog. She has not undergone a sleep study but acknowledges snoring and waking up tired with a headache if her dog has a bad night. No waking  herself up from snoring or experiencing choking sensations.  She has a history of chronic heart failure with preserved ejection fraction and hypertension. Her blood pressure is reportedly better. Her potassium was low due to a lapse in medication but has since resumed taking it. She is not on cholesterol medication, preferring to manage it naturally.        Challenges affecting patient progress: low volume of physical activity at present , medical comorbidities, inadequate sleep, and menopause.    Pharmacotherapy for weight management: She is currently taking Monjauro with diabetes as the primary indication with adequate clinical response  and without side effects..   Assessment and Plan   Treatment Plan For Obesity:  Recommended Dietary Goals  Madie is currently in the action stage of change. As such, her goal is to continue weight management plan. She has agreed to: continue current plan  Behavioral Health and Counseling  We discussed the following behavioral modification strategies today: continue to work on maintaining a reduced calorie state, getting the recommended amount of protein, incorporating whole foods, making healthy choices, staying well hydrated and practicing mindfulness when eating..  Additional education and resources provided today: Handout on symptoms of sleep apnea, health risk and its effect on weight management  Recommended Physical Activity Goals  Ofelia has been advised to work up to 150 minutes of moderate intensity aerobic activity a week and strengthening exercises 2-3 times per week for cardiovascular health, weight loss maintenance and preservation of muscle mass.   She has agreed to :  Think about  enjoyable ways to increase daily physical activity and overcoming barriers to exercise and Increase physical activity in their day and reduce sedentary time (increase NEAT).  Pharmacotherapy  We discussed various medication options to help Amiliana  with her weight loss efforts and we both agreed to : adequate clinical response to current dose, continue current regimen  Associated Conditions Impacted by Obesity Treatment  Suspected sleep apnea Assessment & Plan: We completed screening today.  She has symptoms of disordered sleep breathing suspicious for sleep apnea but she is not interested in having sleep study nor using CPAP therapy we will therefore hold off polysomnography.  She will continue on medically supervised weight management plan inclusive of GLP-1.  Losing 15% of body weight may reduce AHI.  Patient provided with information on the risk associated with untreated sleep apnea.   Type 2 diabetes mellitus with obesity (HCC) Assessment & Plan: Reviewed most recent labs her A1c is now 5.7 from 6.8 she denies any signs or symptoms of hypoglycemia or hyperglycemia.  She is currently on Mounjaro 5 mg once a week without any adverse effects we will continue current dose.  Orders: -     Tirzepatide; Inject 5 mg into the skin once a week.  Dispense: 2 mL; Refill: 0  Generalized obesity Assessment & Plan: Patient has lost those far 23 pounds with preservation of muscle mass.  She is currently on Mounjaro for diabetes management as well as weight management with good clinical response and no adverse effects.  She is working on increasing physical activity.  She is not skipping meals and is maintaining adequate protein intake.  We discussed about maintaining adequate hydration she is also incorporating more whole foods.  I also explained to her the reason why not increasing her GLP-1 as she is losing weight at a healthy rate of 1 pound per week which this was recommended for her age.  Orders: -     Tirzepatide; Inject 5 mg into the skin once a week.  Dispense: 2 mL; Refill: 0  Statin medication declined by patient Assessment & Plan: I reviewed most recent cholesterol we also discussed the benefits and risk associated with statin therapy.   She declines statin therapy at present time.  Her LDL cholesterol has improved but still not at goal.  We also discussed diabetes as a risk factor for coronary artery disease and stroke.     Assessment and Plan    Obesity She is affected by obesity and is following a category two weight management plan. She has lost 23 pounds since April 2024, reducing her weight from 227 lbs to 204 lbs. Her diet includes more whole foods and adequate protein, with occasional meal replacements and hydration. She plans to increase physical activity. The goal is a 15% weight loss from her peak weight, approximately 33 pounds, to prevent muscle loss and ensure sustainable results. - Continue current weight management plan - Encourage increased physical activity - Monitor weight loss progress  Type 2 Diabetes Mellitus Her diabetes is well-controlled with a hemoglobin A1c of 5.7, improved from 6.8 in October, due to weight loss, dietary changes, and medication. She has no symptoms of hypoglycemia. - Continue current diabetes management plan - Monitor hemoglobin A1c levels  Hypertension Her blood pressure is better controlled. - Continue current hypertension management plan  Chronic Heart Failure with Preserved Ejection Fraction She has chronic heart failure with preserved ejection fraction. - Continue current management plan for heart failure  Hypokalemia She experienced hypokalemia due to  running out of her potassium supplement. She has refilled her prescription and plans regular intake to address the deficiency. - Ensure regular intake of potassium supplement  Sleep Apnea She reports poor sleep quality and symptoms suggestive of sleep apnea, such as snoring and waking up tired. Discussed a sleep study, but she is not interested in CPAP. Emphasized weight loss as an alternative treatment, as losing 15% of body weight could significantly improve symptoms. - Provide information on sleep apnea - Consider  sleep study if willing to use CPAP - Encourage weight loss as an alternative treatment  Hyperlipidemia Her cholesterol levels are improving. She prefers lifestyle changes over medication for cholesterol management and is encouraged to continue dietary improvements. - Encourage lifestyle modifications to improve cholesterol levels          Objective   Physical Exam:  Blood pressure 138/86, pulse 75, temperature 97.9 F (36.6 C), height 4\' 11"  (1.499 m), weight 204 lb (92.5 kg), SpO2 96%. Body mass index is 41.2 kg/m.  General: She is overweight, cooperative, alert, well developed, and in no acute distress. PSYCH: Has normal mood, affect and thought process.   HEENT: EOMI, sclerae are anicteric. Lungs: Normal breathing effort, no conversational dyspnea. Extremities: No edema.  Neurologic: No gross sensory or motor deficits. No tremors or fasciculations noted.    Diagnostic Data Reviewed:  BMET    Component Value Date/Time   NA 142 08/03/2023 1006   NA 145 (H) 03/04/2023 1102   K 3.3 (L) 08/03/2023 1006   CL 105 08/03/2023 1006   CO2 28 08/03/2023 1006   GLUCOSE 114 (H) 08/03/2023 1006   BUN 23 08/03/2023 1006   BUN 15 03/04/2023 1102   CREATININE 0.87 08/03/2023 1006   CALCIUM 10.2 08/03/2023 1006   GFRNONAA 60 (L) 11/06/2020 1114   GFRAA 86 02/29/2020 1010   Lab Results  Component Value Date   HGBA1C 5.7 08/03/2023   HGBA1C 7.2 (H) 06/29/2019   Lab Results  Component Value Date   INSULIN 19.7 03/17/2023   Lab Results  Component Value Date   TSH 0.47 08/03/2023   CBC    Component Value Date/Time   WBC 4.7 08/03/2023 1006   RBC 4.51 08/03/2023 1006   HGB 13.0 08/03/2023 1006   HCT 38.7 08/03/2023 1006   HCT 38.2 03/04/2023 1102   PLT 269.0 08/03/2023 1006   MCV 85.7 08/03/2023 1006   MCH 28.0 10/29/2020 0021   MCHC 33.6 08/03/2023 1006   RDW 13.9 08/03/2023 1006   Iron Studies No results found for: "IRON", "TIBC", "FERRITIN", "IRONPCTSAT" Lipid  Panel     Component Value Date/Time   CHOL 184 08/03/2023 1006   CHOL 243 (H) 03/17/2023 1055   TRIG 103.0 08/03/2023 1006   HDL 52.70 08/03/2023 1006   HDL 68 03/17/2023 1055   CHOLHDL 3 08/03/2023 1006   VLDL 20.6 08/03/2023 1006   LDLCALC 110 (H) 08/03/2023 1006   LDLCALC 149 (H) 03/17/2023 1055   Hepatic Function Panel     Component Value Date/Time   PROT 6.6 08/03/2023 1006   PROT 6.6 04/20/2023 1136   ALBUMIN 4.1 08/03/2023 1006   ALBUMIN 3.9 02/29/2020 1010   AST 15 08/03/2023 1006   ALT 8 08/03/2023 1006   ALKPHOS 65 08/03/2023 1006   BILITOT 0.6 08/03/2023 1006   BILITOT 0.7 02/29/2020 1010      Component Value Date/Time   TSH 0.47 08/03/2023 1006   Nutritional Lab Results  Component Value Date   VD25OH  58.4 03/17/2023    Medications: Outpatient Encounter Medications as of 08/04/2023  Medication Sig Note   albuterol (VENTOLIN HFA) 108 (90 Base) MCG/ACT inhaler Inhale 2 puffs into the lungs every 4 (four) hours as needed for wheezing or shortness of breath. 05/08/2023: Pt takes as needed.   allopurinol (ZYLOPRIM) 100 MG tablet TAKE 1 TABLET(100 MG) BY MOUTH DAILY    Apoaequorin (PREVAGEN) 10 MG CAPS Take by mouth.    B Complex-Minerals (ELDERTONIC PO) Take 15 mLs by mouth daily. Geritol    blood glucose meter kit and supplies KIT Dispense based on patient and insurance preference. Use up to four times daily as directed.    Blood Pressure Monitoring (BLOOD PRESSURE CUFF) MISC For daily monitoring as directed.    Calcium-Magnesium-Vitamin D (CALCIUM 1200+D3 PO) Take 600 mg by mouth daily.    cholecalciferol (VITAMIN D) 25 MCG (1000 UNIT) tablet Take 1,000 Units by mouth at bedtime.     Cyanocobalamin ([employer] B12 GUMMIES PO) Take 1,000 mcg by mouth daily.    famotidine (PEPCID) 40 MG tablet Take 1 tablet (40 mg total) by mouth in the morning.    fluticasone (FLONASE) 50 MCG/ACT nasal spray Place 1 spray into both nostrils daily. 05/08/2023: Pt takes as needed.    ipratropium-albuterol (DUONEB) 0.5-2.5 (3) MG/3ML SOLN Take 3 mLs by nebulization every 6 (six) hours as needed (shortness of breath/wheezing).    levocetirizine (XYZAL) 5 MG tablet Take 1 tablet (5 mg total) by mouth every evening.    montelukast (SINGULAIR) 10 MG tablet TAKE 1 TABLET EVERY DAY    ONETOUCH ULTRA test strip USE TO TEST BLOOD SUGAR UP TO FOUR TIMES DAILY    sacubitril-valsartan (ENTRESTO) 49-51 MG Take 1 tablet by mouth 2 (two) times daily.    torsemide (DEMADEX) 20 MG tablet Take 1 tablet (20 mg total) by mouth daily as needed (swelling/fluid/weight gain). 05/08/2023: Pt takes as needed.   triamcinolone cream (KENALOG) 0.1 % Apply topically 2 (two) times daily.    vitamin C (ASCORBIC ACID) 500 MG tablet Take 500 mg by mouth daily.    [DISCONTINUED] tirzepatide [facility]) 5 MG/0.5ML Pen Inject 5 mg into the skin once a week.    carvedilol (COREG) 25 MG tablet Take 1 tablet (25 mg total) by mouth 2 (two) times daily.    tirzepatide [facility]) 5 MG/0.5ML Pen Inject 5 mg into the skin once a week.    No facility-administered encounter medications on file as of 08/04/2023.     Follow-Up   Return in about 4 weeks (around 09/01/2023) for For Weight Mangement with Dr. Rikki Spearing.Marland Kitchen She was informed of the importance of frequent follow up visits to maximize her success with intensive lifestyle modifications for her multiple health conditions.  Attestation Statement   Reviewed by clinician on day of visit: allergies, medications, problem list, medical history, surgical history, family history, social history, and previous encounter notes.     Worthy Rancher, MD

## 2023-08-05 ENCOUNTER — Telehealth (INDEPENDENT_AMBULATORY_CARE_PROVIDER_SITE_OTHER): Payer: Self-pay | Admitting: Internal Medicine

## 2023-08-05 NOTE — Telephone Encounter (Signed)
 Kathlene November the pharmacist from [facility] called and stated he would like you to call back in reference to why this patient is being treated for diabetes and not on a Statin for cholesterol. States Medicare requires a reason (diagnosis code). Please call him back at [phone]  ext [phone]

## 2023-08-05 NOTE — Telephone Encounter (Signed)
 I called [employer] and they said they have not received the [facility] app, pat income, front n back of ins card. The pat mailed that herself on 07/29/23. I called her and left her a message to let her know they have not received it.

## 2023-08-05 NOTE — Telephone Encounter (Signed)
 Received this by fax

## 2023-08-06 ENCOUNTER — Other Ambulatory Visit: Payer: Self-pay | Admitting: Family Medicine

## 2023-08-06 ENCOUNTER — Encounter: Payer: Self-pay | Admitting: Family Medicine

## 2023-08-06 DIAGNOSIS — E876 Hypokalemia: Secondary | ICD-10-CM

## 2023-08-06 MED ORDER — POTASSIUM CHLORIDE CRYS ER 20 MEQ PO TBCR: EXTENDED_RELEASE_TABLET | ORAL | 0 refills | Status: DC

## 2023-08-06 NOTE — Telephone Encounter (Signed)
 I spoke with Kathlene November, reason is that pt declines to take a statin

## 2023-08-10 ENCOUNTER — Encounter: Payer: Self-pay | Admitting: Pharmacy Technician

## 2023-08-10 NOTE — Telephone Encounter (Signed)
 PAP: Patient assistance application for Sherryll Burger has been approved by PAP Companies: [name] from 08/10/23 to 05/25/24. Medication should be delivered to PAP Delivery: Home. For further shipping updates, please contact [name] at [phone]. Patient ID is: [id number]

## 2023-08-13 ENCOUNTER — Other Ambulatory Visit: Payer: Self-pay | Admitting: Family Medicine

## 2023-08-13 DIAGNOSIS — E876 Hypokalemia: Secondary | ICD-10-CM

## 2023-08-25 ENCOUNTER — Ambulatory Visit
Admission: RE | Admit: 2023-08-25 | Discharge: 2023-08-25 | Disposition: A | Discharge status: Home / Self Care | Source: Ambulatory Visit | Attending: Family Medicine | Admitting: Family Medicine

## 2023-08-25 DIAGNOSIS — Z0000 Encounter for general adult medical examination without abnormal findings: Secondary | ICD-10-CM

## 2023-08-25 DIAGNOSIS — Z1231 Encounter for screening mammogram for malignant neoplasm of breast: Secondary | ICD-10-CM | POA: Diagnosis not present

## 2023-09-01 ENCOUNTER — Other Ambulatory Visit: Payer: Self-pay | Admitting: Family Medicine

## 2023-09-01 ENCOUNTER — Telehealth: Payer: Self-pay

## 2023-09-01 DIAGNOSIS — E876 Hypokalemia: Secondary | ICD-10-CM

## 2023-09-01 NOTE — Telephone Encounter (Signed)
 Copied from CRM [id number]. Topic: Clinical - Prescription Issue >> Sep 01, 2023 12:12 PM Gurney Maxin H wrote: Reason for CRM: Patient is calling regarding her potassium chloride 20 MEQ/15ML (10%) SOLN, patient states it is $1000 dollars now with her new [employer], even with a discount it's $200 and used to be around $40. Patient is inquiring is there something less expensive, please reach out to patient, thanks.  [place] [phone]

## 2023-09-02 NOTE — Telephone Encounter (Signed)
 Contacted pharmacy. They were attempting to fill for a 90DS. Cost for 30DS is $78.40, made patient aware and okay with this price, she will pick this up.

## 2023-09-07 ENCOUNTER — Encounter (INDEPENDENT_AMBULATORY_CARE_PROVIDER_SITE_OTHER): Payer: Self-pay | Admitting: Internal Medicine

## 2023-09-07 ENCOUNTER — Ambulatory Visit (INDEPENDENT_AMBULATORY_CARE_PROVIDER_SITE_OTHER): Admitting: Internal Medicine

## 2023-09-07 VITALS — BP 138/84 | HR 79 | Temp 98.4°F | Ht 59.0 in | Wt 205.0 lb

## 2023-09-07 DIAGNOSIS — E1169 Type 2 diabetes mellitus with other specified complication: Secondary | ICD-10-CM | POA: Diagnosis not present

## 2023-09-07 DIAGNOSIS — E66813 Obesity, class 3: Secondary | ICD-10-CM | POA: Insufficient documentation

## 2023-09-07 DIAGNOSIS — Z6841 Body Mass Index (BMI) 40.0 and over, adult: Secondary | ICD-10-CM

## 2023-09-07 DIAGNOSIS — Z9189 Other specified personal risk factors, not elsewhere classified: Secondary | ICD-10-CM

## 2023-09-07 DIAGNOSIS — Z7985 Long-term (current) use of injectable non-insulin antidiabetic drugs: Secondary | ICD-10-CM

## 2023-09-07 MED ORDER — TIRZEPATIDE 7.5 MG/0.5ML ~~LOC~~ SOAJ: 7.5000 mg | SUBCUTANEOUS | 0 refills | Status: DC

## 2023-09-07 NOTE — Assessment & Plan Note (Signed)
 See obesity treatment plan

## 2023-09-07 NOTE — Assessment & Plan Note (Signed)
 Reviewed most recent labs her A1c is now 5.7 from 6.8 she denies any signs or symptoms of hypoglycemia or hyperglycemia.  We will increase Mounjaro to 7.5 mg once a week to aid in weight management.

## 2023-09-07 NOTE — Progress Notes (Signed)
 Office: [phone]  /  Fax: [phone]  Weight Summary And Biometrics  Vitals Temp: 98.4 F (36.9 C) BP: 138/84 Pulse Rate: 79 SpO2: 98 %   Anthropometric Measurements Height: 4\' 11"  (1.499 m) Weight: 205 lb (93 kg) BMI (Calculated): 41.38 Weight at Last Visit: 204 lb Weight Lost Since Last Visit: 1 lb Weight Gained Since Last Visit: 0 lb Starting Weight: 220 lb Total Weight Loss (lbs): 15 lb (6.804 kg) Peak Weight: 230 lb   Body Composition  Body Fat %: 54.6 % Fat Mass (lbs): 112.2 lbs Muscle Mass (lbs): 88.4 lbs Visceral Fat Rating : 19    RMR: [id number]  Today's Visit #: 7  Starting Date: 03/17/23   Subjective   Chief Complaint: Obesity  Interval History Discussed the use of AI scribe software for clinical note transcription with the patient, who gave verbal consent to proceed.  History of Present Illness   Beth Macias is a 73 year old female with diabetes who presents for medication management and follow-up.  She is currently on Mounjaro for diabetes management, taking a 5 mg dose with three doses remaining. She feels the dose may need to be increased due to persistent cravings. She tolerates the medication well without nausea, vomiting, or diarrhea. Her diet is rich in green foods, and she supplements with a powder she finds beneficial. Her last A1c was 6.8.  She has hyperlipidemia with a previous LDL cholesterol level of 149, now improved to 110. She is aware the target is below 100, ideally closer to 70. She recently underwent a comprehensive heart examination with good results and plans to discuss statin use with her family, who are knowledgeable in medical matters.  She takes potassium due to her heart medication, but there was a recent issue with insurance regarding the approval of her potassium prescription.       Challenges affecting patient progress: none, low volume of physical activity at present , medical comorbidities, and menopause.     Pharmacotherapy for weight management: She is currently taking Monjauro with diabetes as the primary indication with adequate clinical response  and without side effects..   Assessment and Plan   Treatment Plan For Obesity:  Recommended Dietary Goals  Osha is currently in the action stage of change. As such, her goal is to continue weight management plan. She has agreed to: continue current plan  Behavioral Health and Counseling  We discussed the following behavioral modification strategies today: continue to work on maintaining a reduced calorie state, getting the recommended amount of protein, incorporating whole foods, making healthy choices, staying well hydrated and practicing mindfulness when eating..  Additional education and resources provided today: None  Recommended Physical Activity Goals  Jasmeen has been advised to work up to 150 minutes of moderate intensity aerobic activity a week and strengthening exercises 2-3 times per week for cardiovascular health, weight loss maintenance and preservation of muscle mass.   She has agreed to :  Think about enjoyable ways to increase daily physical activity and overcoming barriers to exercise and Increase physical activity in their day and reduce sedentary time (increase NEAT).  Pharmacotherapy  We discussed various medication options to help Katyra with her weight loss efforts and we both agreed to : increase Mounjaro to 7.5 mg once a week  Associated Conditions Impacted by Obesity Treatment  Class 3 severe obesity with serious comorbidity and body mass index (BMI) of 40.0 to 44.9 in adult, unspecified obesity type [facility]) Assessment & Plan: See obesity  treatment plan   Type 2 diabetes mellitus with obesity (HCC) Assessment & Plan: Reviewed most recent labs her A1c is now 5.7 from 6.8 she denies any signs or symptoms of hypoglycemia or hyperglycemia.  We will increase Mounjaro to 7.5 mg once a week to aid in weight  management.  Orders: -     Tirzepatide; Inject 7.5 mg into the skin once a week.  Dispense: 2 mL; Refill: 0  At high risk for cardiovascular disease Assessment & Plan: Patient was counseled on increased cardiovascular risk.  She is currently not on statin therapy.  LDL cholesterol improved from 149 mg/dL in February to 657 mg/dL. The goal is to reduce LDL to less than 100 mg/dL, ideally closer to 70 mg/dL, to prevent cardiovascular events.  It was explained that due to diabetes, she is at increased risk for cardiovascular events. Discussed benefits of starting rosuvastatin to lower LDL cholesterol, reduce plaque buildup, and prevent heart attacks and strokes.  She is not interested in statin therapy at present time.  She expressed a desire to discuss this with her family before making a decision. Discuss starting rosuvastatin with family and primary care provider.            Objective   Physical Exam:  Blood pressure 138/84, pulse 79, temperature 98.4 F (36.9 C), height 4\' 11"  (1.499 m), weight 205 lb (93 kg), SpO2 98%. Body mass index is 41.4 kg/m.  General: She is overweight, cooperative, alert, well developed, and in no acute distress. PSYCH: Has normal mood, affect and thought process.   HEENT: EOMI, sclerae are anicteric. Lungs: Normal breathing effort, no conversational dyspnea. Extremities: No edema.  Neurologic: No gross sensory or motor deficits. No tremors or fasciculations noted.    Diagnostic Data Reviewed:  BMET    Component Value Date/Time   NA 142 08/03/2023 1006   NA 145 (H) 03/04/2023 1102   K 3.3 (L) 08/03/2023 1006   CL 105 08/03/2023 1006   CO2 28 08/03/2023 1006   GLUCOSE 114 (H) 08/03/2023 1006   BUN 23 08/03/2023 1006   BUN 15 03/04/2023 1102   CREATININE 0.87 08/03/2023 1006   CALCIUM 10.2 08/03/2023 1006   GFRNONAA 60 (L) 11/06/2020 1114   GFRAA 86 02/29/2020 1010   Lab Results  Component Value Date   HGBA1C 5.7 08/03/2023   HGBA1C 7.2  (H) 06/29/2019   Lab Results  Component Value Date   INSULIN 19.7 03/17/2023   Lab Results  Component Value Date   TSH 0.47 08/03/2023   CBC    Component Value Date/Time   WBC 4.7 08/03/2023 1006   RBC 4.51 08/03/2023 1006   HGB 13.0 08/03/2023 1006   HCT 38.7 08/03/2023 1006   HCT 38.2 03/04/2023 1102   PLT 269.0 08/03/2023 1006   MCV 85.7 08/03/2023 1006   MCH 28.0 10/29/2020 0021   MCHC 33.6 08/03/2023 1006   RDW 13.9 08/03/2023 1006   Iron Studies No results found for: "IRON", "TIBC", "FERRITIN", "IRONPCTSAT" Lipid Panel     Component Value Date/Time   CHOL 184 08/03/2023 1006   CHOL 243 (H) 03/17/2023 1055   TRIG 103.0 08/03/2023 1006   HDL 52.70 08/03/2023 1006   HDL 68 03/17/2023 1055   CHOLHDL 3 08/03/2023 1006   VLDL 20.6 08/03/2023 1006   LDLCALC 110 (H) 08/03/2023 1006   LDLCALC 149 (H) 03/17/2023 1055   Hepatic Function Panel     Component Value Date/Time   PROT 6.6 08/03/2023 1006  PROT 6.6 04/20/2023 1136   ALBUMIN 4.1 08/03/2023 1006   ALBUMIN 3.9 02/29/2020 1010   AST 15 08/03/2023 1006   ALT 8 08/03/2023 1006   ALKPHOS 65 08/03/2023 1006   BILITOT 0.6 08/03/2023 1006   BILITOT 0.7 02/29/2020 1010      Component Value Date/Time   TSH 0.47 08/03/2023 1006   Nutritional Lab Results  Component Value Date   VD25OH 58.4 03/17/2023    Medications: Outpatient Encounter Medications as of 09/07/2023  Medication Sig Note   albuterol (VENTOLIN HFA) 108 (90 Base) MCG/ACT inhaler Inhale 2 puffs into the lungs every 4 (four) hours as needed for wheezing or shortness of breath. 05/08/2023: Pt takes as needed.   allopurinol (ZYLOPRIM) 100 MG tablet TAKE 1 TABLET(100 MG) BY MOUTH DAILY    Apoaequorin (PREVAGEN) 10 MG CAPS Take by mouth.    B Complex-Minerals (ELDERTONIC PO) Take 15 mLs by mouth daily. Geritol    blood glucose meter kit and supplies KIT Dispense based on patient and insurance preference. Use up to four times daily as directed.     Blood Pressure Monitoring (BLOOD PRESSURE CUFF) MISC For daily monitoring as directed.    Calcium-Magnesium-Vitamin D (CALCIUM 1200+D3 PO) Take 600 mg by mouth daily.    cholecalciferol (VITAMIN D) 25 MCG (1000 UNIT) tablet Take 1,000 Units by mouth at bedtime.     Cyanocobalamin ([employer] B12 GUMMIES PO) Take 1,000 mcg by mouth daily.    famotidine (PEPCID) 40 MG tablet Take 1 tablet (40 mg total) by mouth in the morning.    fluticasone (FLONASE) 50 MCG/ACT nasal spray Place 1 spray into both nostrils daily. 05/08/2023: Pt takes as needed.   ipratropium-albuterol (DUONEB) 0.5-2.5 (3) MG/3ML SOLN Take 3 mLs by nebulization every 6 (six) hours as needed (shortness of breath/wheezing).    levocetirizine (XYZAL) 5 MG tablet Take 1 tablet (5 mg total) by mouth every evening.    montelukast (SINGULAIR) 10 MG tablet TAKE 1 TABLET EVERY DAY    ONETOUCH ULTRA test strip USE TO TEST BLOOD SUGAR UP TO FOUR TIMES DAILY    potassium chloride 20 MEQ/15ML (10%) SOLN TAKE 15 MLS BY MOUTH DAILY AS NEEDED(WITH EACH DOSE OF TORSEMIDE)    sacubitril-valsartan (ENTRESTO) 49-51 MG Take 1 tablet by mouth 2 (two) times daily.    tirzepatide (MOUNJARO) 7.5 MG/0.5ML Pen Inject 7.5 mg into the skin once a week.    torsemide (DEMADEX) 20 MG tablet Take 1 tablet (20 mg total) by mouth daily as needed (swelling/fluid/weight gain). 05/08/2023: Pt takes as needed.   triamcinolone cream (KENALOG) 0.1 % Apply topically 2 (two) times daily.    vitamin C (ASCORBIC ACID) 500 MG tablet Take 500 mg by mouth daily.    [DISCONTINUED] tirzepatide [facility]) 5 MG/0.5ML Pen Inject 5 mg into the skin once a week.    carvedilol (COREG) 25 MG tablet Take 1 tablet (25 mg total) by mouth 2 (two) times daily.    No facility-administered encounter medications on file as of 09/07/2023.     Follow-Up   Return in about 4 weeks (around 10/05/2023) for For Weight Mangement with Dr. Rikki Spearing.Marland Kitchen She was informed of the importance of frequent follow up  visits to maximize her success with intensive lifestyle modifications for her multiple health conditions.  Attestation Statement   Reviewed by clinician on day of visit: allergies, medications, problem list, medical history, surgical history, family history, social history, and previous encounter notes.     Worthy Rancher, MD

## 2023-09-07 NOTE — Assessment & Plan Note (Addendum)
 Patient was counseled on increased cardiovascular risk.  She is currently not on statin therapy.  LDL cholesterol improved from 149 mg/dL in February to 865 mg/dL. The goal is to reduce LDL to less than 100 mg/dL, ideally closer to 70 mg/dL, to prevent cardiovascular events.  It was explained that due to diabetes, she is at increased risk for cardiovascular events. Discussed benefits of starting rosuvastatin to lower LDL cholesterol, reduce plaque buildup, and prevent heart attacks and strokes.  She is not interested in statin therapy at present time.  She expressed a desire to discuss this with her family before making a decision. Discuss starting rosuvastatin with family and primary care provider.

## 2023-09-16 ENCOUNTER — Telehealth: Payer: Self-pay

## 2023-09-16 NOTE — Telephone Encounter (Signed)
 Called and spoke with patient, went over last 3 cholesterol labs results with patient, explain to patient that on 08/12/22 the nurse called the patient and went over lab results and at that time "Pt declined rosuvastatin as she concerns with se being on a lot of medication. Pt states she will focus on her diet and exercise." patient is aware, patient has verbalized understanding

## 2023-09-16 NOTE — Telephone Encounter (Signed)
 Copied from CRM [id number]. Topic: General - Other >> Sep 16, 2023 12:02 PM Aisha D wrote: Reason for CRM: Patient is calling in regards to her cholesterol. Patient wants to know if her cholesterol has been high and if so what are numbers, and does she need to be on statin to control it. Patient also wants to know if she has myopathy and cardiomyopathy. Patient would like someone to reach out by Friday regarding these concerns.

## 2023-09-21 ENCOUNTER — Telehealth (INDEPENDENT_AMBULATORY_CARE_PROVIDER_SITE_OTHER): Payer: Self-pay | Admitting: Internal Medicine

## 2023-09-21 NOTE — Telephone Encounter (Signed)
 Patient called requesting to speak with Edwina Gram. Pt wants to ask questions about medications that Dr. Allie Area wants to place her on. Please call at the number on file. [facility].

## 2023-09-22 ENCOUNTER — Telehealth (INDEPENDENT_AMBULATORY_CARE_PROVIDER_SITE_OTHER): Payer: Self-pay | Admitting: Family Medicine

## 2023-09-22 NOTE — Telephone Encounter (Signed)
 Message for Dr. Allie Area not Dr. Alvia Awkward  Patient returned call stating she is interested in going on the Rosuvastatin 20 mg. Please send script to [facility] on [address] in [place]. Patient states she will pick it up as soon as it is ready. Please leave message when the script has been sent in

## 2023-09-22 NOTE — Telephone Encounter (Signed)
 Left pt a message to call me back, returned call, no answer

## 2023-09-23 MED ORDER — ROSUVASTATIN CALCIUM 10 MG PO TABS
10.0000 mg | ORAL_TABLET | Freq: Every day | ORAL | 0 refills | Status: DC
Start: 2023-09-23 — End: 2023-10-15

## 2023-09-23 NOTE — Addendum Note (Signed)
 Addended by: Calhoun Catalina A on: 09/23/2023 04:15 PM   Modules accepted: Orders

## 2023-10-06 ENCOUNTER — Telehealth (INDEPENDENT_AMBULATORY_CARE_PROVIDER_SITE_OTHER): Payer: Self-pay | Admitting: Internal Medicine

## 2023-10-06 NOTE — Telephone Encounter (Signed)
 Left pt a message to keep appt 5/22 with Dr. Allie Area or she may call to see an earlier appt is available with another provider

## 2023-10-06 NOTE — Telephone Encounter (Signed)
 Pt reports she needs a refill for her Mounjaro, she reports she has an appt on 5/22 but she took her last dose on 5/12 and she will be behind on taking the medication if she does not get a refill until 5/22. Please follow up with the patient.

## 2023-10-06 NOTE — Telephone Encounter (Signed)
 Patient asks she receives a phone and not a mychart message about her medications.

## 2023-10-15 ENCOUNTER — Encounter (INDEPENDENT_AMBULATORY_CARE_PROVIDER_SITE_OTHER): Payer: Self-pay | Admitting: Internal Medicine

## 2023-10-15 ENCOUNTER — Ambulatory Visit (INDEPENDENT_AMBULATORY_CARE_PROVIDER_SITE_OTHER): Admitting: Internal Medicine

## 2023-10-15 VITALS — BP 136/86 | HR 71 | Temp 98.2°F | Ht 59.0 in | Wt 205.0 lb

## 2023-10-15 DIAGNOSIS — F4321 Adjustment disorder with depressed mood: Secondary | ICD-10-CM | POA: Insufficient documentation

## 2023-10-15 DIAGNOSIS — E1169 Type 2 diabetes mellitus with other specified complication: Secondary | ICD-10-CM

## 2023-10-15 DIAGNOSIS — E785 Hyperlipidemia, unspecified: Secondary | ICD-10-CM

## 2023-10-15 DIAGNOSIS — Z7985 Long-term (current) use of injectable non-insulin antidiabetic drugs: Secondary | ICD-10-CM

## 2023-10-15 DIAGNOSIS — Z9189 Other specified personal risk factors, not elsewhere classified: Secondary | ICD-10-CM | POA: Diagnosis not present

## 2023-10-15 DIAGNOSIS — Z6841 Body Mass Index (BMI) 40.0 and over, adult: Secondary | ICD-10-CM

## 2023-10-15 DIAGNOSIS — E66813 Obesity, class 3: Secondary | ICD-10-CM

## 2023-10-15 MED ORDER — ATORVASTATIN CALCIUM 20 MG PO TABS
20.0000 mg | ORAL_TABLET | Freq: Every day | ORAL | 0 refills | Status: DC
Start: 2023-10-15 — End: 2024-02-02

## 2023-10-15 MED ORDER — TIRZEPATIDE 10 MG/0.5ML ~~LOC~~ SOAJ: 10.0000 mg | SUBCUTANEOUS | 0 refills | Status: DC

## 2023-10-15 NOTE — Assessment & Plan Note (Signed)
 Patient is had somewhat of a lapse in her weight management strategy due to recent loss in her family she has maintained but has not been as active.  She is interested in starting aqua aerobics again.  She is noticing some breakthrough hunger and decrease satiety at the current dose of Mounjaro and is requesting to go up to the next dose.  After review of medication side effects medication will be increased to 10 mg weekly.  She will continue to work on implementation of her nutrition plan.

## 2023-10-15 NOTE — Assessment & Plan Note (Signed)
 Counseling and support was provided.  She has experienced a loss of a family member unexpectedly at a young age.  She has a big supportive family and was advised to reach out to them for support there is also availability of support groups locally through hospice which may participate if needed.  She will monitor for symptoms of depression.

## 2023-10-15 NOTE — Assessment & Plan Note (Signed)
 Patient reports experiencing side effects since starting rosuvastatin  in the form of gas bloating and flatulence she reports symptoms resolved after discontinuation of drug and is not interested in rechallenging.  We again reviewed her cardiovascular risk which is above 30% and the benefits of statin therapy she is agreeable to trying a different statin.  We will prescribe atorvastatin 20 mg a day.  Discontinue rosuvastatin 

## 2023-10-15 NOTE — Progress Notes (Signed)
 Office: [phone]  /  Fax: [phone]  Weight Summary And Biometrics  Vitals Temp: 98.2 F (36.8 C) BP: 136/86 Pulse Rate: 71 SpO2: 99 %   Anthropometric Measurements Height: 4\' 11"  (1.499 m) Weight: 205 lb (93 kg) BMI (Calculated): 41.38 Weight at Last Visit: 205lb Weight Lost Since Last Visit: 0lb Weight Gained Since Last Visit: 0lb Starting Weight: 220lb Total Weight Loss (lbs): 15 lb (6.804 kg) Peak Weight: 230lb   Body Composition  Body Fat %: 54.7 % Fat Mass (lbs): 112.2 lbs Muscle Mass (lbs): 88 lbs Visceral Fat Rating : 19   The 10-year ASCVD risk score (Arnett DK, et al., 2019) is: 30.5%   RMR: 1771  Today's Visit #: 8  Starting Date: 03/17/23   Subjective   Chief Complaint: Obesity  Interval History Discussed the use of AI scribe software for clinical note transcription with the patient, who gave verbal consent to proceed.  History of Present Illness   Beth Macias is a 73 year old female who presents for follow-up on medical weight loss.  Since last office visit she has maintained.  She is currently on Mounjaro 7.5 mg once a week for diabetes management primarily and secondary weight management.  Commend  She experiences significant gastrointestinal discomfort, including excessive gas, burping, and flatulence, after starting rosuvastatin . These symptoms began after taking the medication for about six days and resolved after discontinuing it for three to four days. She is concerned about potential interactions with her other medications prescribed by her cardiologist and family practitioner.  She is currently on Mounjaro for weight management. Her appetite control was initially good, but she has recently experienced cravings. She denies any issues with constipation and has been consuming a diet rich in vegetables, including root vegetables and collard greens. She has maintained her weight since March, with fluctuations but no significant  weight loss.  She is grieving the recent loss of a close family member, her cousin's grandchild, who passed away unexpectedly at the age of 10. This has been a source of emotional distress, and she is trying to cope with the shock and adjust to the loss.  She has a family history of cardiovascular disease, and her LDL cholesterol is currently at 150.       Challenges affecting patient progress: low volume of physical activity at present , medical comorbidities, moderate to high levels of stress, and menopause.    Pharmacotherapy for weight management: She is currently taking Monjauro with diabetes as the primary indication with adequate clinical response  and without side effects..   Assessment and Plan   Treatment Plan For Obesity:  Recommended Dietary Goals  Beth Macias is currently in the action stage of change. As such, her goal is to continue weight management plan. She has agreed to: continue current plan  Behavioral Health and Counseling  We discussed the following behavioral modification strategies today: continue to work on maintaining a reduced calorie state, getting the recommended amount of protein, incorporating whole foods, making healthy choices, staying well hydrated and practicing mindfulness when eating..  Additional education and resources provided today: None  Recommended Physical Activity Goals  Beth Macias has been advised to work up to 150 minutes of moderate intensity aerobic activity a week and strengthening exercises 2-3 times per week for cardiovascular health, weight loss maintenance and preservation of muscle mass.   She has agreed to :  Think about enjoyable ways to increase daily physical activity and overcoming barriers to exercise and Increase physical activity  in their day and reduce sedentary time (increase NEAT).  Pharmacotherapy  We discussed various medication options to help Beth Macias with her weight loss efforts and we both agreed to : increase  Monjauro to 10 mg once a week  Associated Conditions Impacted by Obesity Treatment  At high risk for cardiovascular disease Assessment & Plan: Patient reports experiencing side effects since starting rosuvastatin  in the form of gas bloating and flatulence she reports symptoms resolved after discontinuation of drug and is not interested in rechallenging.  We again reviewed her cardiovascular risk which is above 30% and the benefits of statin therapy she is agreeable to trying a different statin.  We will prescribe atorvastatin 20 mg a day.  Discontinue rosuvastatin   Orders: -     Atorvastatin Calcium ; Take 1 tablet (20 mg total) by mouth daily.  Dispense: 90 tablet; Refill: 0  Type 2 diabetes mellitus with obesity (HCC) Assessment & Plan: Reviewed most recent labs her A1c is now 5.7 from 6.8 she denies any signs or symptoms of hypoglycemia or hyperglycemia.  She would like to increase Mounjaro due to breakthrough hunger and decrease satiety.  We reviewed again medication side effects.  Medication will be increased to 10 mg once a week.  She will monitor for signs and symptoms of hypoglycemia.  Orders: -     Tirzepatide; Inject 10 mg into the skin once a week.  Dispense: 2 mL; Refill: 0  Hyperlipidemia associated with type 2 diabetes mellitus (HCC) Assessment & Plan: Patient reports experiencing side effects since starting rosuvastatin  in the form of gas bloating and flatulence she reports symptoms resolved after discontinuation of drug and is not interested in rechallenging.  We again reviewed her cardiovascular risk which is above 30% and the benefits of statin therapy she is agreeable to trying a different statin.  We will prescribe atorvastatin 20 mg a day.  Discontinue rosuvastatin    Class 3 severe obesity with serious comorbidity and body mass index (BMI) of 40.0 to 44.9 in adult Assessment & Plan: Patient is had somewhat of a lapse in her weight management strategy due to recent loss in  her family she has maintained but has not been as active.  She is interested in starting aqua aerobics again.  She is noticing some breakthrough hunger and decrease satiety at the current dose of Mounjaro and is requesting to go up to the next dose.  After review of medication side effects medication will be increased to 10 mg weekly.  She will continue to work on implementation of her nutrition plan.   Grief Assessment & Plan: Counseling and support was provided.  She has experienced a loss of a family member unexpectedly at a young age.  She has a big supportive family and was advised to reach out to them for support there is also availability of support groups locally through hospice which may participate if needed.  She will monitor for symptoms of depression.             Objective   Physical Exam:  Blood pressure 136/86, pulse 71, temperature 98.2 F (36.8 C), height 4\' 11"  (1.499 m), weight 205 lb (93 kg), SpO2 99%. Body mass index is 41.4 kg/m.  General: She is overweight, cooperative, alert, well developed, and in no acute distress. PSYCH: Has normal mood, affect and thought process.   HEENT: EOMI, sclerae are anicteric. Lungs: Normal breathing effort, no conversational dyspnea. Extremities: No edema.  Neurologic: No gross sensory or motor deficits. No tremors or  fasciculations noted.    Diagnostic Data Reviewed:  BMET    Component Value Date/Time   NA 142 08/03/2023 1006   NA 145 (H) 03/04/2023 1102   K 3.3 (L) 08/03/2023 1006   CL 105 08/03/2023 1006   CO2 28 08/03/2023 1006   GLUCOSE 114 (H) 08/03/2023 1006   BUN 23 08/03/2023 1006   BUN 15 03/04/2023 1102   CREATININE 0.87 08/03/2023 1006   CALCIUM  10.2 08/03/2023 1006   GFRNONAA 60 (L) 11/06/2020 1114   GFRAA 86 02/29/2020 1010   Lab Results  Component Value Date   HGBA1C 5.7 08/03/2023   HGBA1C 7.2 (H) 06/29/2019   Lab Results  Component Value Date   INSULIN  19.7 03/17/2023   Lab Results   Component Value Date   TSH 0.47 08/03/2023   CBC    Component Value Date/Time   WBC 4.7 08/03/2023 1006   RBC 4.51 08/03/2023 1006   HGB 13.0 08/03/2023 1006   HCT 38.7 08/03/2023 1006   HCT 38.2 03/04/2023 1102   PLT 269.0 08/03/2023 1006   MCV 85.7 08/03/2023 1006   MCH 28.0 10/29/2020 0021   MCHC 33.6 08/03/2023 1006   RDW 13.9 08/03/2023 1006   Iron Studies No results found for: "IRON", "TIBC", "FERRITIN", "IRONPCTSAT" Lipid Panel     Component Value Date/Time   CHOL 184 08/03/2023 1006   CHOL 243 (H) 03/17/2023 1055   TRIG 103.0 08/03/2023 1006   HDL 52.70 08/03/2023 1006   HDL 68 03/17/2023 1055   CHOLHDL 3 08/03/2023 1006   VLDL 20.6 08/03/2023 1006   LDLCALC 110 (H) 08/03/2023 1006   LDLCALC 149 (H) 03/17/2023 1055   Hepatic Function Panel     Component Value Date/Time   PROT 6.6 08/03/2023 1006   PROT 6.6 04/20/2023 1136   ALBUMIN 4.1 08/03/2023 1006   ALBUMIN 3.9 02/29/2020 1010   AST 15 08/03/2023 1006   ALT 8 08/03/2023 1006   ALKPHOS 65 08/03/2023 1006   BILITOT 0.6 08/03/2023 1006   BILITOT 0.7 02/29/2020 1010      Component Value Date/Time   TSH 0.47 08/03/2023 1006   Nutritional Lab Results  Component Value Date   VD25OH 58.4 03/17/2023    Medications: Outpatient Encounter Medications as of 10/15/2023  Medication Sig Note   albuterol  (VENTOLIN  HFA) 108 (90 Base) MCG/ACT inhaler Inhale 2 puffs into the lungs every 4 (four) hours as needed for wheezing or shortness of breath. 05/08/2023: Pt takes as needed.   allopurinol  (ZYLOPRIM ) 100 MG tablet TAKE 1 TABLET(100 MG) BY MOUTH DAILY    Apoaequorin (PREVAGEN) 10 MG CAPS Take by mouth.    atorvastatin (LIPITOR) 20 MG tablet Take 1 tablet (20 mg total) by mouth daily.    B Complex-Minerals (ELDERTONIC PO) Take 15 mLs by mouth daily. Geritol    blood glucose meter kit and supplies KIT Dispense based on patient and insurance preference. Use up to four times daily as directed.    Blood Pressure  Monitoring (BLOOD PRESSURE CUFF) MISC For daily monitoring as directed.    Calcium -Magnesium -Vitamin D  (CALCIUM  1200+D3 PO) Take 600 mg by mouth daily.    cholecalciferol (VITAMIN D ) 25 MCG (1000 UNIT) tablet Take 1,000 Units by mouth at bedtime.     Cyanocobalamin  (CVS B12 GUMMIES PO) Take 1,000 mcg by mouth daily.    famotidine  (PEPCID ) 40 MG tablet Take 1 tablet (40 mg total) by mouth in the morning.    fluticasone  (FLONASE ) 50 MCG/ACT nasal spray Place 1  spray into both nostrils daily. 05/08/2023: Pt takes as needed.   ipratropium-albuterol  (DUONEB) 0.5-2.5 (3) MG/3ML SOLN Take 3 mLs by nebulization every 6 (six) hours as needed (shortness of breath/wheezing).    levocetirizine (XYZAL ) 5 MG tablet Take 1 tablet (5 mg total) by mouth every evening.    montelukast  (SINGULAIR ) 10 MG tablet TAKE 1 TABLET EVERY DAY    ONETOUCH ULTRA test strip USE TO TEST BLOOD SUGAR UP TO FOUR TIMES DAILY    potassium chloride  20 MEQ/15ML (10%) SOLN TAKE 15 MLS BY MOUTH DAILY AS NEEDED(WITH EACH DOSE OF TORSEMIDE )    sacubitril -valsartan  (ENTRESTO ) 49-51 MG Take 1 tablet by mouth 2 (two) times daily.    tirzepatide  (MOUNJARO ) 10 MG/0.5ML Pen Inject 10 mg into the skin once a week.    torsemide  (DEMADEX ) 20 MG tablet Take 1 tablet (20 mg total) by mouth daily as needed (swelling/fluid/weight gain). 05/08/2023: Pt takes as needed.   triamcinolone  cream (KENALOG ) 0.1 % Apply topically 2 (two) times daily.    vitamin C (ASCORBIC ACID) 500 MG tablet Take 500 mg by mouth daily.    [DISCONTINUED] tirzepatide  (MOUNJARO ) 7.5 MG/0.5ML Pen Inject 7.5 mg into the skin once a week.    carvedilol  (COREG ) 25 MG tablet Take 1 tablet (25 mg total) by mouth 2 (two) times daily.    [DISCONTINUED] rosuvastatin  (CRESTOR ) 10 MG tablet Take 1 tablet (10 mg total) by mouth daily. (Patient not taking: Reported on 10/15/2023) 10/15/2023: abdominal bloating   No facility-administered encounter medications on file as of 10/15/2023.      Follow-Up   Return in about 4 weeks (around 11/12/2023) for For Weight Mangement with Dr. Allie Area.Aaron Aas She was informed of the importance of frequent follow up visits to maximize her success with intensive lifestyle modifications for her multiple health conditions.  Attestation Statement   Reviewed by clinician on day of visit: allergies, medications, problem list, medical history, surgical history, family history, social history, and previous encounter notes.     Ladd Picker, MD

## 2023-10-15 NOTE — Assessment & Plan Note (Signed)
 Reviewed most recent labs her A1c is now 5.7 from 6.8 she denies any signs or symptoms of hypoglycemia or hyperglycemia.  She would like to increase Mounjaro due to breakthrough hunger and decrease satiety.  We reviewed again medication side effects.  Medication will be increased to 10 mg once a week.  She will monitor for signs and symptoms of hypoglycemia.

## 2023-10-26 ENCOUNTER — Ambulatory Visit: Payer: Medicare Other | Admitting: Podiatry

## 2023-10-26 ENCOUNTER — Encounter: Payer: Self-pay | Admitting: Podiatry

## 2023-10-26 ENCOUNTER — Ambulatory Visit (INDEPENDENT_AMBULATORY_CARE_PROVIDER_SITE_OTHER)

## 2023-10-26 VITALS — Ht 59.0 in | Wt 205.0 lb

## 2023-10-26 DIAGNOSIS — E119 Type 2 diabetes mellitus without complications: Secondary | ICD-10-CM | POA: Diagnosis not present

## 2023-10-26 DIAGNOSIS — B351 Tinea unguium: Secondary | ICD-10-CM | POA: Diagnosis not present

## 2023-10-26 DIAGNOSIS — M2141 Flat foot [pes planus] (acquired), right foot: Secondary | ICD-10-CM

## 2023-10-26 DIAGNOSIS — S90121A Contusion of right lesser toe(s) without damage to nail, initial encounter: Secondary | ICD-10-CM | POA: Diagnosis not present

## 2023-10-26 DIAGNOSIS — M2142 Flat foot [pes planus] (acquired), left foot: Secondary | ICD-10-CM

## 2023-10-26 DIAGNOSIS — L84 Corns and callosities: Secondary | ICD-10-CM | POA: Diagnosis not present

## 2023-10-26 DIAGNOSIS — M79676 Pain in unspecified toe(s): Secondary | ICD-10-CM

## 2023-10-26 NOTE — Patient Instructions (Addendum)
   TRY ON AND PURCHASE SNEAKERS FROM CHOICES BELOW:  [employer] SNEAKERS NEW BALANCE SNEAKERS 800 SERIES OR HIGHER 3. BROOKS 4. ON CLOUD 5. [facility] [address] [place], [place] [address] [phone]

## 2023-10-26 NOTE — Progress Notes (Signed)
 ANNUAL DIABETIC FOOT EXAM  Subjective: Beth Macias presents today for annual diabetic foot exam.  Chief Complaint  Patient presents with   Nail Problem    Pt is here for [facility] PCP is Dr Arliss Lam and lov was in March., pt is also complaining of right foot second toe pain states she bump toe about a week ago and toe is swollen and bruised, thinks toenail may need to come off, and left foot pain in the arch of foot states the pain has been off and on for quite a while.   Patient confirms h/o diabetes.  Patient denies any h/o foot wounds.  Viola Greulich, MD is patient's PCP.  Past Medical History:  Diagnosis Date   Anxiety    Asthma    Back pain    Blood in stool    Chewing difficulty    CHF (congestive heart failure) (HCC)    Colon polyps    Constipation    Diabetes mellitus without complication (HCC)    Diverticulitis    Fibromyalgia    GERD (gastroesophageal reflux disease)    Gout    Hypercholesteremia    Hypertension    Joint pain    Lactose intolerance    Pneumonia    Pneumonia    Prediabetes    SOB (shortness of breath)    UTI (urinary tract infection)    Vitamin B 12 deficiency    Vitamin D  deficiency    Patient Active Problem List   Diagnosis Date Noted   Grief 10/15/2023   Class 3 severe obesity with serious comorbidity and body mass index (BMI) of 40.0 to 44.9 in adult 09/07/2023   Physically inactive 07/07/2023   Other specified counseling 04/17/2023   At high risk for cardiovascular disease 03/31/2023   Statin medication declined by patient 03/31/2023   Generalized obesity starting BMI  44.5 03/17/2023   Suspected sleep apnea 03/17/2023   Chronic heart failure with preserved ejection fraction (HCC) 01/12/2023   Hypertension associated with type 2 diabetes mellitus (HCC) 01/12/2023   Hyperlipidemia associated with type 2 diabetes mellitus (HCC) 01/12/2023   Acute on chronic heart failure with preserved ejection fraction (HFpEF) (HCC) 10/27/2020    Hypertensive urgency 10/27/2020   Type 2 diabetes mellitus with obesity (HCC) 10/27/2020   CHF (congestive heart failure) (HCC) 12/10/2018   Mild intermittent asthma without complication 06/30/2018   Environmental and seasonal allergies 06/30/2018   Diabetes (HCC) 04/04/2018   Morbid obesity with BMI of 40.0-44.9, adult (HCC) 02/18/2016   Extrinsic asthma without complication 06/19/2015   Cubital tunnel syndrome, right 07/06/2014   Hypokalemia 08/16/2013   Past Surgical History:  Procedure Laterality Date   APPENDECTOMY     CESAREAN SECTION     x 3   LIPOMA EXCISION     MENISCUS REPAIR Left    OVARIAN CYST REMOVAL     OVARIAN CYST SURGERY     Current Outpatient Medications on File Prior to Visit  Medication Sig Dispense Refill   albuterol  (VENTOLIN  HFA) 108 (90 Base) MCG/ACT inhaler Inhale 2 puffs into the lungs every 4 (four) hours as needed for wheezing or shortness of breath. 8.5 each 5   allopurinol  (ZYLOPRIM ) 100 MG tablet TAKE 1 TABLET(100 MG) BY MOUTH DAILY 30 tablet 6   Apoaequorin (PREVAGEN) 10 MG CAPS Take by mouth.     atorvastatin  (LIPITOR) 20 MG tablet Take 1 tablet (20 mg total) by mouth daily. 90 tablet 0   B Complex-Minerals (ELDERTONIC PO) Take  15 mLs by mouth daily. Geritol     blood glucose meter kit and supplies KIT Dispense based on patient and insurance preference. Use up to four times daily as directed. 1 each 0   Blood Pressure Monitoring (BLOOD PRESSURE CUFF) MISC For daily monitoring as directed. 1 each 0   Calcium -Magnesium -Vitamin D  (CALCIUM  1200+D3 PO) Take 600 mg by mouth daily.     cholecalciferol (VITAMIN D ) 25 MCG (1000 UNIT) tablet Take 1,000 Units by mouth at bedtime.      Cyanocobalamin  (CVS B12 GUMMIES PO) Take 1,000 mcg by mouth daily.     famotidine  (PEPCID ) 40 MG tablet Take 1 tablet (40 mg total) by mouth in the morning. 90 tablet 3   fluticasone  (FLONASE ) 50 MCG/ACT nasal spray Place 1 spray into both nostrils daily. 16 g 6    ipratropium-albuterol  (DUONEB) 0.5-2.5 (3) MG/3ML SOLN Take 3 mLs by nebulization every 6 (six) hours as needed (shortness of breath/wheezing).     levocetirizine (XYZAL ) 5 MG tablet Take 1 tablet (5 mg total) by mouth every evening. 90 tablet 1   montelukast  (SINGULAIR ) 10 MG tablet TAKE 1 TABLET EVERY DAY 90 tablet 3   ONETOUCH ULTRA test strip USE TO TEST BLOOD SUGAR UP TO FOUR TIMES DAILY 100 strip 3   potassium chloride  20 MEQ/15ML (10%) SOLN TAKE 15 MLS BY MOUTH DAILY AS NEEDED(WITH EACH DOSE OF TORSEMIDE ) 1373 mL 3   sacubitril -valsartan  (ENTRESTO ) 49-51 MG Take 1 tablet by mouth 2 (two) times daily. 180 tablet 3   tirzepatide  (MOUNJARO ) 10 MG/0.5ML Pen Inject 10 mg into the skin once a week. 2 mL 0   torsemide  (DEMADEX ) 20 MG tablet Take 1 tablet (20 mg total) by mouth daily as needed (swelling/fluid/weight gain). 90 tablet 1   triamcinolone  cream (KENALOG ) 0.1 % Apply topically 2 (two) times daily.     vitamin C (ASCORBIC ACID) 500 MG tablet Take 500 mg by mouth daily.     carvedilol  (COREG ) 25 MG tablet Take 1 tablet (25 mg total) by mouth 2 (two) times daily. 180 tablet 3   No current facility-administered medications on file prior to visit.    Allergies  Allergen Reactions   Strawberry (Diagnostic) Anaphylaxis and Other (See Comments)    hives   Farxiga  [Dapagliflozin ] Other (See Comments)    Had HAs, weakness, rhinorrhea, jitteriness, nasal congestion, nocturia, yeast infections, memory changes, irritability.   Penicillins Itching    Has patient had a PCN reaction causing immediate rash, facial/tongue/throat swelling, SOB or lightheadedness with hypotension: No Has patient had a PCN reaction causing severe rash involving mucus membranes or skin necrosis: No Has patient had a PCN reaction that required hospitalization: Unknown Has patient had a PCN reaction occurring within the last 10 years: No If all of the above answers are "NO", then may proceed with Cephalosporin use.    Amoxicillin Other (See Comments)   Rosuvastatin  Other (See Comments)    Bloating, flatulence   Gabapentin Itching, Rash and Other (See Comments)    Incoherent   Tape Other (See Comments)    Blisters   Social History   Occupational History   Occupation: Runner, broadcasting/film/video   Occupation: Retired Psychologist, forensic - 2020  Tobacco Use   Smoking status: Former    Current packs/day: 0.00    Average packs/day: 0.3 packs/day for 9.0 years (2.3 ttl pk-yrs)    Types: Cigarettes    Start date: 05/27/1967    Quit date: 05/26/1976    Years since quitting:  47.4   Smokeless tobacco: Never  Vaping Use   Vaping status: Never Used  Substance and Sexual Activity   Alcohol use: Never   Drug use: Never   Sexual activity: Yes   Family History  Problem Relation Age of Onset   Heart failure Mother    Gout Mother    Hypertension Mother    Diabetes Mother    High Cholesterol Mother    Heart disease Mother    Obesity Mother    Heart failure Father    Hypertension Father    AAA (abdominal aortic aneurysm) Father    Heart disease Father    Gout Brother    Hypertension Brother    Heart attack Brother    Colon cancer Maternal Uncle    Crohn's disease Paternal Aunt    Breast cancer Neg Hx    Esophageal cancer Neg Hx    Pancreatic cancer Neg Hx    Stomach cancer Neg Hx    Immunization History  Administered Date(s) Administered   Fluad Quad(high Dose 65+) 03/07/2019, 07/12/2021   Fluad Trivalent(High Dose 65+) 04/17/2023   Influenza Whole 02/23/2018   Influenza, High Dose Seasonal PF 04/23/2015, 03/04/2016   Influenza, Seasonal, Injecte, Preservative Fre 03/28/2013, 05/12/2014   PFIZER(Purple Top)SARS-COV-2 Vaccination 07/21/2019, 08/16/2019, 02/29/2020   PPD Test 02/18/2016   Pneumococcal Conjugate-13 04/23/2015   Pneumococcal Polysaccharide-23 08/11/2013, 06/29/2019   Tdap 05/12/2014     Review of Systems: Negative except as noted in the HPI.   Objective: There were no vitals filed for this  visit.  Beth Macias is a pleasant 73 y.o. female in NAD. AAO X 3.  Diabetic foot exam was performed with the following findings:   Vascular Examination: Capillary refill time immediate b/l. Vascular status intact b/l with palpable DP pulses; faintly palpable PT pulses. Pedal hair diminished b/l. Trace edema b/l ankles. Edema noted right 2nd digit at DIPJ. No pain with calf compression b/l. Skin temperature gradient WNL b/l. No cyanosis or clubbing noted b/l. No ischemia or gangrene b/l.   Neurological Examination: Sensation grossly intact b/l with 10 gram monofilament. Vibratory sensation intact b/l.   Dermatological Examination: Right 2nd digit with mild ecchymosis, No damage to nailbed/nail plate. No erythema, no edema, no subungual drainage. Pedal skin with normal turgor, texture and tone b/l.  No open wounds. No interdigital macerations.   Toenails 1-5 b/l thick, discolored, elongated with subungual debris and pain on dorsal palpation.   Hyperkeratotic lesion(s) bilateral great toes.  No erythema, no edema, no drainage, no fluctuance.  Musculoskeletal Examination: Muscle strength 5/5 to all lower extremity muscle groups bilaterally. Pain on palpation to right 2nd digit. Pes planus deformity noted bilateral LE. Pain on palpation left calcaneal tubercle.  Xray findings right foot: No gas in tissues right foot and R 2nd toe. No evidence of fracture right foot. Pes planus foot deformity right foot.      Lab Results  Component Value Date   HGBA1C 5.7 08/03/2023   ADA Risk Categorization: Low Risk :  Patient has all of the following: Intact protective sensation No prior foot ulcer  No severe deformity Pedal pulses present  Assessment: 1. Pain due to onychomycosis of toenail   2. Callus   3. Contusion of lesser toe of right foot without damage to nail, initial encounter   4. Pes planus of both feet   5. Type 2 diabetes mellitus without complication, without long-term  current use of insulin  (HCC)   6. Encounter for diabetic foot  exam [facility])     Plan: Orders Placed This Encounter  Procedures   DG Foot Complete Right    Standing Status:   Future    Number of Occurrences:   1    Expiration Date:   10/25/2024    Reason for Exam (SYMPTOM  OR DIAGNOSIS REQUIRED):   pain    Preferred imaging location?:   Internal   Diabetic foot examination performed today. All patient's and/or POA's questions/concerns addressed on today's visit. Toenails 1-5 debrided in length and girth without incident. Calluses b/l great toes pared with sterile scalpel blade. Continue foot and shoe inspections daily. Monitor blood glucose per PCP/Endocrinologist's recommendations. Continue soft, supportive shoe gear daily. Report any pedal injuries to medical professional. Call office if there are any questions/concerns. -For contusion right 2nd digit, demonstrated loose buddy splinting for digit. Educated on checking circulation of digit. If pain/swelling continues, see Dr. Lara Plants for re-evaluation -Shoe recommendations given for Hoka, Brooks, Altra or New Balance or OnCloud. Replaced damaged plantar fascial brace. If pain does not improve, see Dr. Lara Plants for re-evaluation. -Patient/POA to call should there be question/concern in the interim. Return in about 3 months (around 01/26/2024).  Luella Sager, DPM      [place] LOCATION: [address] [place] [address]                   Office [phone]   [facility] LOCATION: [address] [place], [place] [address] Office [phone]

## 2023-11-03 DIAGNOSIS — M25532 Pain in left wrist: Secondary | ICD-10-CM | POA: Diagnosis not present

## 2023-11-03 DIAGNOSIS — S52135A Nondisplaced fracture of neck of left radius, initial encounter for closed fracture: Secondary | ICD-10-CM | POA: Diagnosis not present

## 2023-11-03 DIAGNOSIS — S62102A Fracture of unspecified carpal bone, left wrist, initial encounter for closed fracture: Secondary | ICD-10-CM | POA: Diagnosis not present

## 2023-11-03 DIAGNOSIS — I160 Hypertensive urgency: Secondary | ICD-10-CM | POA: Diagnosis not present

## 2023-11-03 DIAGNOSIS — I10 Essential (primary) hypertension: Secondary | ICD-10-CM | POA: Diagnosis not present

## 2023-11-03 DIAGNOSIS — M25522 Pain in left elbow: Secondary | ICD-10-CM | POA: Diagnosis not present

## 2023-11-06 ENCOUNTER — Encounter: Payer: Self-pay | Admitting: Family Medicine

## 2023-11-06 ENCOUNTER — Ambulatory Visit (INDEPENDENT_AMBULATORY_CARE_PROVIDER_SITE_OTHER): Admitting: Family Medicine

## 2023-11-06 VITALS — BP 130/86 | Temp 98.6°F | Ht 59.0 in | Wt 210.0 lb

## 2023-11-06 DIAGNOSIS — T148XXA Other injury of unspecified body region, initial encounter: Secondary | ICD-10-CM

## 2023-11-06 DIAGNOSIS — M79602 Pain in left arm: Secondary | ICD-10-CM

## 2023-11-06 DIAGNOSIS — W19XXXA Unspecified fall, initial encounter: Secondary | ICD-10-CM | POA: Diagnosis not present

## 2023-11-06 DIAGNOSIS — S52132D Displaced fracture of neck of left radius, subsequent encounter for closed fracture with routine healing: Secondary | ICD-10-CM

## 2023-11-06 NOTE — Progress Notes (Signed)
 Established Patient Office Visit   Subjective  Patient ID: Beth Macias, female    DOB: [birth year]  Age: 73 y.o. MRN: [id number]  No chief complaint on file.   Patient is a 46 old female seen for acute concern.  Patient fell 3 days ago at home after tripping over her dog on the concrete patio.  Patient landed on left arm.  Seen at [facility] 11/03/2023.  X-ray left elbow with impacted fracture neck of radial head, soft tissue swelling.  X-ray left wrist with prominent degenerative changes first carpometacarpal and lateral mid carpal joint, old fracture ulnar styloid, fracture distal radius indeterminate age, calcification adjacent to dorsal triquetrium questionable fracture versus chronic soft tissue calcification.  Arm splinted.  Given Norco and sling.  Patient advised to follow-up with PCP.  If needed patient taking half a tab Norco as she is afraid to become addicted to medication.  Pt states arm is pruritic and red from the ACE bandage used.  Pt notes prior hx of L arm fx with external fixation while in [place], [place].      Patient Active Problem List   Diagnosis Date Noted   Grief 10/15/2023   Class 3 severe obesity with serious comorbidity and body mass index (BMI) of 40.0 to 44.9 in adult 09/07/2023   Physically inactive 07/07/2023   Other specified counseling 04/17/2023   At high risk for cardiovascular disease 03/31/2023   Statin medication declined by patient 03/31/2023   Generalized obesity starting BMI  44.5 03/17/2023   Suspected sleep apnea 03/17/2023   Chronic heart failure with preserved ejection fraction (HCC) 01/12/2023   Hypertension associated with type 2 diabetes mellitus (HCC) 01/12/2023   Hyperlipidemia associated with type 2 diabetes mellitus (HCC) 01/12/2023   Acute on chronic heart failure with preserved ejection fraction (HFpEF) (HCC) 10/27/2020   Hypertensive urgency 10/27/2020   Type 2 diabetes mellitus with obesity (HCC) 10/27/2020   CHF (congestive heart  failure) (HCC) 12/10/2018   Mild intermittent asthma without complication 06/30/2018   Environmental and seasonal allergies 06/30/2018   Diabetes (HCC) 04/04/2018   Morbid obesity with BMI of 40.0-44.9, adult (HCC) 02/18/2016   Extrinsic asthma without complication 06/19/2015   Cubital tunnel syndrome, right 07/06/2014   Hypokalemia 08/16/2013   Past Medical History:  Diagnosis Date   Anxiety    Asthma    Back pain    Blood in stool    Chewing difficulty    CHF (congestive heart failure) (HCC)    Colon polyps    Constipation    Diabetes mellitus without complication (HCC)    Diverticulitis    Fibromyalgia    GERD (gastroesophageal reflux disease)    Gout    Hypercholesteremia    Hypertension    Joint pain    Lactose intolerance    Pneumonia    Pneumonia    Prediabetes    SOB (shortness of breath)    UTI (urinary tract infection)    Vitamin B 12 deficiency    Vitamin D  deficiency    Past Surgical History:  Procedure Laterality Date   APPENDECTOMY     CESAREAN SECTION     x 3   LIPOMA EXCISION     MENISCUS REPAIR Left    OVARIAN CYST REMOVAL     OVARIAN CYST SURGERY     Social History   Tobacco Use   Smoking status: Former    Current packs/day: 0.00    Average packs/day: 0.3 packs/day for 9.0 years (2.3 ttl  pk-yrs)    Types: Cigarettes    Start date: 05/27/1967    Quit date: 05/26/1976    Years since quitting: 47.4   Smokeless tobacco: Never  Vaping Use   Vaping status: Never Used  Substance Use Topics   Alcohol use: Never   Drug use: Never   Family History  Problem Relation Age of Onset   Heart failure Mother    Gout Mother    Hypertension Mother    Diabetes Mother    High Cholesterol Mother    Heart disease Mother    Obesity Mother    Heart failure Father    Hypertension Father    AAA (abdominal aortic aneurysm) Father    Heart disease Father    Gout Brother    Hypertension Brother    Heart attack Brother    Colon cancer Maternal Uncle     Crohn's disease Paternal Aunt    Breast cancer Neg Hx    Esophageal cancer Neg Hx    Pancreatic cancer Neg Hx    Stomach cancer Neg Hx    Allergies  Allergen Reactions   Strawberry (Diagnostic) Anaphylaxis and Other (See Comments)    hives   Farxiga  [Dapagliflozin ] Other (See Comments)    Had HAs, weakness, rhinorrhea, jitteriness, nasal congestion, nocturia, yeast infections, memory changes, irritability.   Penicillins Itching    Has patient had a PCN reaction causing immediate rash, facial/tongue/throat swelling, SOB or lightheadedness with hypotension: No Has patient had a PCN reaction causing severe rash involving mucus membranes or skin necrosis: No Has patient had a PCN reaction that required hospitalization: Unknown Has patient had a PCN reaction occurring within the last 10 years: No If all of the above answers are NO, then may proceed with Cephalosporin use.   Amoxicillin Other (See Comments)   Rosuvastatin  Other (See Comments)    Bloating, flatulence   Gabapentin Itching, Rash and Other (See Comments)    Incoherent   Tape Other (See Comments)    Blisters    ROS Negative unless stated above    Objective:     BP 130/86 (BP Location: Left Arm, Patient Position: Sitting, Cuff Size: Large)   Temp 98.6 F (37 C) (Oral)   Ht 4' 11 (1.499 m)   Wt 210 lb (95.3 kg)   SpO2 98%   BMI 42.41 kg/m  BP Readings from Last 3 Encounters:  11/06/23 130/86  10/15/23 136/86  09/07/23 138/84   Wt Readings from Last 3 Encounters:  11/06/23 210 lb (95.3 kg)  10/26/23 205 lb (93 kg)  10/15/23 205 lb (93 kg)      Physical Exam Constitutional:      Appearance: Normal appearance. She is obese.  HENT:     Head: Normocephalic and atraumatic.     Nose: Nose normal.     Mouth/Throat:     Mouth: Mucous membranes are moist.   Cardiovascular:     Rate and Rhythm: Normal rate.  Pulmonary:     Effort: Pulmonary effort is normal.   Musculoskeletal:        General: Signs of  injury present.     Right elbow: Normal.     Left elbow: Swelling present. Decreased range of motion. Tenderness present in radial head.     Right forearm: Normal.     Left forearm: Swelling and bony tenderness present.     Comments: Fiberglass splinting material and ace bandage on LUE.   Skin:    General: Skin is warm.  Comments: Skin of L mid upper arm to dorsum of L hand with erythema and mild swelling.   Neurological:     Mental Status: She is alert and oriented to person, place, and time. Mental status is at baseline.        11/06/2023    4:32 PM 08/03/2023    9:50 AM 04/17/2023    9:41 AM  Depression screen PHQ 2/9  Decreased Interest 0 0 0  Down, Depressed, Hopeless 0 0 0  PHQ - 2 Score 0 0 0  Altered sleeping 2 0 0  Tired, decreased energy 2 0 1  Change in appetite 0 0 0  Feeling bad or failure about yourself  0 0 0  Trouble concentrating 0 0 0  Moving slowly or fidgety/restless 0 0 0  Suicidal thoughts 0 0 0  PHQ-9 Score 4 0 1  Difficult doing work/chores Not difficult at all Not difficult at all Not difficult at all      11/06/2023    4:33 PM 08/03/2023    9:52 AM 04/17/2023    9:41 AM 09/23/2022   10:24 AM  GAD 7 : Generalized Anxiety Score  Nervous, Anxious, on Edge 1 0 0 0  Control/stop worrying 1 0 0 0  Worry too much - different things 1 0 0 0  Trouble relaxing 0 1 0 0  Restless 0 0 0 0  Easily annoyed or irritable 1 1 0 0  Afraid - awful might happen 0 0 0 0  Total GAD 7 Score 4 2 0 0  Anxiety Difficulty Somewhat difficult Not difficult at all Not difficult at all Not difficult at all     No results found for any visits on 11/06/23.    Assessment & Plan:   Closed displaced fracture of neck of left radius with routine healing, subsequent encounter -     Ambulatory referral to Orthopedic Surgery  Fracture in accidental fall  Left arm pain   X-ray report of left elbow and left wrist from [facility] 11/03/2023: Impacted fracture of radial neck and  questionable fracture of triquetrium left wrist versus calcification.  Also noted a fracture of the distal radius of indeterminate age.  Given irritation of the skin arm re-splinted.  Referral to Ortho placed.  Given information for urgent Ortho care.  Continue half tab Norco as needed.  Can take Tylenol  if needed for less severe pain.  Return if symptoms worsen or fail to improve.   Viola Greulich, MD

## 2023-11-10 DIAGNOSIS — M25532 Pain in left wrist: Secondary | ICD-10-CM | POA: Diagnosis not present

## 2023-11-10 DIAGNOSIS — M25522 Pain in left elbow: Secondary | ICD-10-CM | POA: Diagnosis not present

## 2023-11-10 DIAGNOSIS — S52045A Nondisplaced fracture of coronoid process of left ulna, initial encounter for closed fracture: Secondary | ICD-10-CM | POA: Diagnosis not present

## 2023-11-20 NOTE — Progress Notes (Signed)
 [facility] Quality Team Note  Name: Beth Macias Date of Birth: 09-01-1950 MRN: [id number] Date: 11/20/2023  [facility] Quality Team has reviewed this patient's chart, please see recommendations below:  [facility] Quality Other; (CHART REVIEWED FOR KIDNEY HEALTH EVALUATION IN DIABETICS AND DIABETIC EYE EXAM. ABSTRACTED KED LABS. NO EYE EXAM FOUND)

## 2023-11-23 ENCOUNTER — Ambulatory Visit (INDEPENDENT_AMBULATORY_CARE_PROVIDER_SITE_OTHER): Admitting: Family Medicine

## 2023-11-23 ENCOUNTER — Ambulatory Visit: Payer: Self-pay

## 2023-11-23 ENCOUNTER — Encounter: Payer: Self-pay | Admitting: Family Medicine

## 2023-11-23 VITALS — BP 170/86 | HR 72 | Temp 98.1°F | Wt 204.9 lb

## 2023-11-23 DIAGNOSIS — E1169 Type 2 diabetes mellitus with other specified complication: Secondary | ICD-10-CM | POA: Diagnosis not present

## 2023-11-23 DIAGNOSIS — E669 Obesity, unspecified: Secondary | ICD-10-CM

## 2023-11-23 DIAGNOSIS — Z8739 Personal history of other diseases of the musculoskeletal system and connective tissue: Secondary | ICD-10-CM

## 2023-11-23 DIAGNOSIS — R600 Localized edema: Secondary | ICD-10-CM

## 2023-11-23 DIAGNOSIS — Z7985 Long-term (current) use of injectable non-insulin antidiabetic drugs: Secondary | ICD-10-CM

## 2023-11-23 MED ORDER — DOXYCYCLINE HYCLATE 100 MG PO CAPS
100.0000 mg | ORAL_CAPSULE | Freq: Two times a day (BID) | ORAL | 0 refills | Status: DC
Start: 2023-11-23 — End: 2024-03-03

## 2023-11-23 NOTE — Patient Instructions (Signed)
 Set up labs as discussed  Keep follow up with podiatrist, as discussed.

## 2023-11-23 NOTE — Progress Notes (Signed)
 Established Patient Office Visit  Subjective   Patient ID: Beth Macias, female    DOB: May 19, 1951  Age: 73 y.o. MRN: [id number]  Chief Complaint  Patient presents with   Toe Pain    HPI   Beth Macias is here to have ongoing swelling right second toe re-evaluated.  She has actually seen podiatrist already for this.  History is somewhat fragmented but apparently back sometime late May she may have bumped her toe.  Went to podiatrist June 2 and had x-rays which showed no fracture.  No obvious bony erosions.  Buddy taped to the third toe but she did not do this for long.   She states she has had swelling for well over a month.  She does reportedly have history of gout but no obvious recent flareups.  She has not noted any significant warmth.  No fever.  Does have multiple chronic problems including congestive heart failure with preserved ejection fraction, hypertension, type 2 diabetes, hyperlipidemia.  We reviewed her med list which includes allopurinol  100 mg daily though she was not aware that she was taking gout medicine. Her biggest concern was ongoing swelling and darkened color of the right second toe.  She has not noted recent ulcers.  Does not have any known history of severe peripheral vascular disease.  Last A1c back in March was 5.7.  She is on Mounjaro  and blood sugar control has improved since starting this.  Past Medical History:  Diagnosis Date   Anxiety    Asthma    Back pain    Blood in stool    Chewing difficulty    CHF (congestive heart failure) (HCC)    Colon polyps    Constipation    Diabetes mellitus without complication (HCC)    Diverticulitis    Fibromyalgia    GERD (gastroesophageal reflux disease)    Gout    Hypercholesteremia    Hypertension    Joint pain    Lactose intolerance    Pneumonia    Pneumonia    Prediabetes    SOB (shortness of breath)    UTI (urinary tract infection)    Vitamin B 12 deficiency    Vitamin D  deficiency    Past  Surgical History:  Procedure Laterality Date   APPENDECTOMY     CESAREAN SECTION     x 3   LIPOMA EXCISION     MENISCUS REPAIR Left    OVARIAN CYST REMOVAL     OVARIAN CYST SURGERY      reports that she quit smoking about 47 years ago. Her smoking use included cigarettes. She started smoking about 56 years ago. She has a 2.3 pack-year smoking history. She has never used smokeless tobacco. She reports that she does not drink alcohol and does not use drugs. family history includes AAA (abdominal aortic aneurysm) in her father; Colon cancer in her maternal uncle; Crohn's disease in her paternal aunt; Diabetes in her mother; Gout in her brother and mother; Heart attack in her brother; Heart disease in her father and mother; Heart failure in her father and mother; High Cholesterol in her mother; Hypertension in her brother, father, and mother; Obesity in her mother. Allergies  Allergen Reactions   Strawberry (Diagnostic) Anaphylaxis and Other (See Comments)    hives   Farxiga  [Dapagliflozin ] Other (See Comments)    Had HAs, weakness, rhinorrhea, jitteriness, nasal congestion, nocturia, yeast infections, memory changes, irritability.   Penicillins Itching    Has patient had a PCN  reaction causing immediate rash, facial/tongue/throat swelling, SOB or lightheadedness with hypotension: No Has patient had a PCN reaction causing severe rash involving mucus membranes or skin necrosis: No Has patient had a PCN reaction that required hospitalization: Unknown Has patient had a PCN reaction occurring within the last 10 years: No If all of the above answers are NO, then may proceed with Cephalosporin use.   Amoxicillin Other (See Comments)   Rosuvastatin  Other (See Comments)    Bloating, flatulence   Gabapentin Itching, Rash and Other (See Comments)    Incoherent   Tape Other (See Comments)    Blisters    Review of Systems  Constitutional:  Negative for chills and fever.  Respiratory:  Negative  for shortness of breath.   Cardiovascular:  Negative for chest pain.      Objective:     BP (!) 170/86 (BP Location: Left Arm, Patient Position: Sitting, Cuff Size: Large)   Pulse 72   Temp 98.1 F (36.7 C) (Oral)   Wt 204 lb 14.4 oz (92.9 kg)   SpO2 95%   BMI 41.38 kg/m  BP Readings from Last 3 Encounters:  11/23/23 (!) 170/86  11/06/23 130/86  10/15/23 136/86   Wt Readings from Last 3 Encounters:  11/23/23 204 lb 14.4 oz (92.9 kg)  11/06/23 210 lb (95.3 kg)  10/26/23 205 lb (93 kg)      Physical Exam Vitals reviewed.  Constitutional:      General: She is not in acute distress.    Appearance: She is not ill-appearing.   Cardiovascular:     Rate and Rhythm: Normal rate.  Pulmonary:     Effort: Pulmonary effort is normal.     Breath sounds: Normal breath sounds.   Musculoskeletal:     Comments: Right second toe reveals some obvious swelling compared to the other toes.  She has some ?hyperpigmentation changes.  Question of some very mild erythema along the medial border.  No visible breaks in the skin.  Does not have any erythema involving the foot.  Feet are warm to touch with 1+ dorsalis pedis pulse on the right foot.  Good capillary refill.  Toenail intact.     Neurological:     Mental Status: She is alert.      No results found for any visits on 11/23/23.    The 10-year ASCVD risk score (Arnett DK, et al., 2019) is: 41.9%    Assessment & Plan:   Problem List Items Addressed This Visit   None Visit Diagnoses       Edema of toe    -  Primary   Relevant Orders   CBC with Differential/Platelet   Sedimentation rate   Uric Acid   Hemoglobin A1c     Patient is seen with over 1 month history of right second toe edema.  She does recall bumping the toe at home over a month ago.  Previous x-rays showed no fracture.  No obvious bony erosions.  No obvious osteomyelitis changes.  Family concerned because of ongoing swelling.  Differential is swelling related  to recent injury, gout , other.  No warmth to suggest likely acute gout flare and pain minimal.  Doubt active cellulitis, though perhaps very mild erythema medial border.  Skin intact with no ulcerations.  -Check labs with CBC, sed rate, uric acid.  Patient also requesting repeat A1c with labs.  Future lab order placed as our lab was closed when patient was seen - Watch closely for any warmth  or increased erythema. -start Doxycycline 100 mg po bid for 7 days.   No follow-ups on file.    Wolm Scarlet, MD

## 2023-11-23 NOTE — Telephone Encounter (Signed)
 FYI Only or Action Required?: FYI only for provider.  Patient was last seen in primary care on 11/06/2023 by Mercer Clotilda SAUNDERS, MD. Called Nurse Triage reporting Foot Pain. Symptoms began a week ago. Interventions attempted: Rest, hydration, or home remedies. Symptoms are: unchanged.  Triage Disposition: See Physician Within 47 Hours-daughter who is a doctor concerned for possible infection-wanted patient to be seen today. Acute appointment scheduled for today at 4:00 PM  Patient/caregiver understands and will follow disposition?: Yes  Copied from CRM [id number]. Topic: Clinical - Red Word Triage >> Nov 23, 2023 11:10 AM Corin V wrote: [facility] that prompted transfer to Nurse Triage: Patient has bruising and swelling near distal end of the right foot. She may have stumped her toe and recently had a nail trim by the podiatrist. They would like to be seen to ensure no infection since she has diabetes as well. She get an x-ray 6/2 and was told there is no fracture. Foot is still swollen. Reason for Disposition  [1] Swollen foot AND [2] no fever  (Exceptions: localized bump from bunions, calluses, insect bite, sting)  Answer Assessment - Initial Assessment Questions 1. ONSET: When did the pain start?      Pain started at the beginning of the month 2. LOCATION: Where is the pain located?      Right foot-second toe.  3. PAIN: How bad is the pain?    (Scale 1-10; or mild, moderate, severe)  - MILD (1-3): doesn't interfere with normal activities.   - MODERATE (4-7): interferes with normal activities (e.g., work or school) or awakens from sleep, limping.   - SEVERE (8-10): excruciating pain, unable to do any normal activities, unable to walk.      Pain currently intermittent pain.  4. WORK OR EXERCISE: Has there been any recent work or exercise that involved this part of the body?      Patient thinks she may have stumped her toe in May. 5. CAUSE: What do you think is causing the foot pain?      unsure 6. OTHER SYMPTOMS: Do you have any other symptoms? (e.g., leg pain, rash, fever, numbness)     Bruising and swelling to the distal end of the right foot.  Daughter is a doctor and wanting her mother seen today if at all possible. Appointment scheduled in PCP office with another provider.  Protocols used: Foot Pain-A-AH

## 2023-11-24 ENCOUNTER — Ambulatory Visit: Payer: Self-pay | Admitting: Family Medicine

## 2023-11-24 ENCOUNTER — Telehealth: Payer: Self-pay | Admitting: *Deleted

## 2023-11-24 ENCOUNTER — Other Ambulatory Visit (INDEPENDENT_AMBULATORY_CARE_PROVIDER_SITE_OTHER)

## 2023-11-24 ENCOUNTER — Ambulatory Visit: Admitting: Family Medicine

## 2023-11-24 DIAGNOSIS — R600 Localized edema: Secondary | ICD-10-CM

## 2023-11-24 LAB — CBC WITH DIFFERENTIAL/PLATELET
Basophils Absolute: 0 10*3/uL (ref 0.0–0.1)
Basophils Relative: 0.6 % (ref 0.0–3.0)
Eosinophils Absolute: 0.2 10*3/uL (ref 0.0–0.7)
Eosinophils Relative: 3.9 % (ref 0.0–5.0)
HCT: 38.6 % (ref 36.0–46.0)
Hemoglobin: 12.6 g/dL (ref 12.0–15.0)
Lymphocytes Relative: 30 % (ref 12.0–46.0)
Lymphs Abs: 1.6 10*3/uL (ref 0.7–4.0)
MCHC: 32.6 g/dL (ref 30.0–36.0)
MCV: 84.9 fl (ref 78.0–100.0)
Monocytes Absolute: 0.5 10*3/uL (ref 0.1–1.0)
Monocytes Relative: 8.5 % (ref 3.0–12.0)
Neutro Abs: 3.1 10*3/uL (ref 1.4–7.7)
Neutrophils Relative %: 57 % (ref 43.0–77.0)
Platelets: 278 10*3/uL (ref 150.0–400.0)
RBC: 4.55 Mil/uL (ref 3.87–5.11)
RDW: 13.9 % (ref 11.5–15.5)
WBC: 5.4 10*3/uL (ref 4.0–10.5)

## 2023-11-24 LAB — URIC ACID: Uric Acid, Serum: 5.1 mg/dL (ref 2.4–7.0)

## 2023-11-24 LAB — HEMOGLOBIN A1C: Hgb A1c MFr Bld: 5.8 % (ref 4.6–6.5)

## 2023-11-24 LAB — SEDIMENTATION RATE: Sed Rate: 16 mm/h (ref 0–30)

## 2023-11-24 NOTE — Telephone Encounter (Signed)
 Communication  Reason for CRM: Patient called in regarding a missed call from Mykal, informed her per note that if any hint of redness to go ahead and start the doxycycline

## 2023-11-24 NOTE — Telephone Encounter (Signed)
 Noted   please see result note

## 2023-11-25 ENCOUNTER — Encounter (INDEPENDENT_AMBULATORY_CARE_PROVIDER_SITE_OTHER): Payer: Self-pay | Admitting: Internal Medicine

## 2023-11-25 ENCOUNTER — Ambulatory Visit (INDEPENDENT_AMBULATORY_CARE_PROVIDER_SITE_OTHER): Admitting: Internal Medicine

## 2023-11-25 VITALS — BP 138/89 | HR 72 | Temp 98.1°F | Ht 59.0 in | Wt 202.0 lb

## 2023-11-25 DIAGNOSIS — Z7985 Long-term (current) use of injectable non-insulin antidiabetic drugs: Secondary | ICD-10-CM

## 2023-11-25 DIAGNOSIS — E1169 Type 2 diabetes mellitus with other specified complication: Secondary | ICD-10-CM | POA: Diagnosis not present

## 2023-11-25 DIAGNOSIS — Z6841 Body Mass Index (BMI) 40.0 and over, adult: Secondary | ICD-10-CM | POA: Diagnosis not present

## 2023-11-25 DIAGNOSIS — E66813 Obesity, class 3: Secondary | ICD-10-CM

## 2023-11-25 DIAGNOSIS — E785 Hyperlipidemia, unspecified: Secondary | ICD-10-CM | POA: Diagnosis not present

## 2023-11-25 MED ORDER — TIRZEPATIDE 10 MG/0.5ML ~~LOC~~ SOAJ: 10.0000 mg | SUBCUTANEOUS | 1 refills | Status: DC

## 2023-11-25 NOTE — Assessment & Plan Note (Signed)
 She did not tolerate rosuvastatin  and is currently on atorvastatin  20 mg daily without any adverse effects.  She will continue on medication for cardiovascular risk reduction.  Consider repeating fasting lipid profile in 3 months.

## 2023-11-25 NOTE — Progress Notes (Signed)
 Office: [phone]  /  Fax: [phone]  Weight Summary and Body Composition Analysis (BIA)  Vitals Temp: 98.1 F (36.7 C) BP: 138/89 Pulse Rate: 72 SpO2: 99 %   Anthropometric Measurements Height: 4' 11 (1.499 m) Weight: 202 lb (91.6 kg) BMI (Calculated): 40.78 Weight at Last Visit: 205 lb Weight Lost Since Last Visit: 3 lb Weight Gained Since Last Visit: 0 lb Starting Weight: 220 lb Total Weight Loss (lbs): 18 lb (8.165 kg) Peak Weight: 230 lb   Body Composition  Body Fat %: 54.3 % Fat Mass (lbs): 109.6 lbs Muscle Mass (lbs): 87.6 lbs Visceral Fat Rating : 19    RMR: [id number]  Today's Visit #: 9  Starting Date: 03/17/23   Subjective   Chief Complaint: Obesity  Interval History Discussed the use of AI scribe software for clinical note transcription with the patient, who gave verbal consent to proceed.  History of Present Illness   Beth Macias is a 73 year old female who presents for medical weight management.  Since the last visit, she has lost three pounds. She follows a 1200 calorie meal plan with low adherence and is not tracking her intake. She consumes more foods, gets the recommended amount of protein, but occasionally skips meals. She is not engaging in any exercise.  She feels despondent and down, which led her to skip a week of her medication. During this time, she consumed more fruits and vegetables, often pureeing them, and ate eggs, including quail eggs. She 'cheats' on her diet due to feeling depressed, particularly consuming chocolate, but tries to opt for dark chocolate.  She experienced a fall after tripping over her dog, resulting in bruising on her arm. She has a history of a broken arm with pins and was concerned about re-injury. She visited a specialist after her fall and wears a brace for support but forgot it today.  She has a history of elevated A1c, which has improved significantly from 7.2 in 2021 to 5.8 recently, attributed  to dietary changes and weight loss. She is currently at a weight of 202 pounds, down from 227 pounds in April of the previous year.  She is currently taking atorvastatin , had side effects to rosuvastatin  but is tolerating medication  She mentions having a large family with recent illnesses and a cousin who was hospitalized and is not expected to recover.  She lives with a 16 year old dog and has a beach house she plans to remodel. She has a cousin who assists with home renovations.       Challenges affecting patient progress: low volume of physical activity at present  and menopause.    Pharmacotherapy for weight management: She is currently taking Monjauro with diabetes as the primary indication with adequate clinical response  and without side effects..   Assessment and Plan   Treatment Plan For Obesity:  Recommended Dietary Goals  Beth Macias is currently in the action stage of change. As such, her goal is to continue weight management plan. She has agreed to: continue current plan  Behavioral Health and Counseling  We discussed the following behavioral modification strategies today: continue to work on maintaining a reduced calorie state, getting the recommended amount of protein, incorporating whole foods, making healthy choices, staying well hydrated and practicing mindfulness when eating..  Additional education and resources provided today: None  Recommended Physical Activity Goals  Beth Macias has been advised to work up to 150 minutes of moderate intensity aerobic activity a week and strengthening exercises 2-3 times  per week for cardiovascular health, weight loss maintenance and preservation of muscle mass.   She has agreed to :  Think about enjoyable ways to increase daily physical activity and overcoming barriers to exercise and Increase physical activity in their day and reduce sedentary time (increase NEAT).  Medical Interventions and Pharmacotherapy  We discussed  various medication options to help Beth Macias with her weight loss efforts and we both agreed to : Adequate clinical response to anti-obesity medication, continue current regimen and do not recommend further increases in GLP-1 due to adequate clinical response   Associated Conditions Impacted by Obesity Treatment  Assessment & Plan Type 2 diabetes mellitus with obesity (HCC) Her A1c has improved significantly from 7.2 in 2021 to 5.8, indicating good control likely due to weight loss and dietary changes and treatment with GLP-1. -Continue Mounjaro  10 mg once a week - Continue current diabetes management plan.  Blood pressure has also improved still little bit above target but better. Hyperlipidemia associated with type 2 diabetes mellitus (HCC) She did not tolerate rosuvastatin  and is currently on atorvastatin  20 mg daily without any adverse effects.  She will continue on medication for cardiovascular risk reduction.  Consider repeating fasting lipid profile in 3 months. Class 3 severe obesity with serious comorbidity and body mass index (BMI) of 40.0 to 44.9 in adult She has lost 18 pounds on medically supervised weight management plan inclusive of GLP-1.  She is undergoing medical weight management and has lost three pounds since the last visit, achieving a weight of 202 pounds from 227 pounds in April last year. She follows a 1200 calorie meal plan with low adherence, occasionally skips meals, and does not exercise. Her weight loss is gradual, expected given her age and race. She is on a medication regimen aiding weight loss without side effects and is comfortable with the current dose. - Continue Mounjaro  at 10 mg once a week and send prescription to [facility] with a refill. - Encourage adherence to the 1200 calorie meal plan and tracking of food intake. - Emphasize the importance of protein intake from meats, eggs, beans, shrimp, and salmon.    Fall Risk She experienced a fall due to tripping  over her dog, resulting in bruising but no fractures. She has a previous fracture with pins in her arm. She is advised to take precautions to prevent future falls, especially at night, and is considering installing motion-sensing lights. - Consider installing motion-sensing lights to improve visibility at night. - Encourage use of a brace for arm support as needed.        Objective   Physical Exam:  Blood pressure 138/89, pulse 72, temperature 98.1 F (36.7 C), height 4' 11 (1.499 m), weight 202 lb (91.6 kg), SpO2 99%. Body mass index is 40.8 kg/m.  General: She is overweight, cooperative, alert, well developed, and in no acute distress. PSYCH: Has normal mood, affect and thought process.   HEENT: EOMI, sclerae are anicteric. Lungs: Normal breathing effort, no conversational dyspnea. Extremities: No edema.  Neurologic: No gross sensory or motor deficits. No tremors or fasciculations noted.    Diagnostic Data Reviewed:  BMET    Component Value Date/Time   NA 142 08/03/2023 1006   NA 145 (H) 03/04/2023 1102   K 3.3 (L) 08/03/2023 1006   CL 105 08/03/2023 1006   CO2 28 08/03/2023 1006   GLUCOSE 114 (H) 08/03/2023 1006   BUN 23 08/03/2023 1006   BUN 15 03/04/2023 1102   CREATININE 0.87 08/03/2023 1006  CALCIUM  10.2 08/03/2023 1006   GFRNONAA 60 (L) 11/06/2020 1114   GFRAA 86 02/29/2020 1010   Lab Results  Component Value Date   HGBA1C 5.8 11/24/2023   HGBA1C 7.2 (H) 06/29/2019   Lab Results  Component Value Date   INSULIN  19.7 03/17/2023   Lab Results  Component Value Date   TSH 0.47 08/03/2023   CBC    Component Value Date/Time   WBC 5.4 11/24/2023 0813   RBC 4.55 11/24/2023 0813   HGB 12.6 11/24/2023 0813   HCT 38.6 11/24/2023 0813   HCT 38.2 03/04/2023 1102   PLT 278.0 11/24/2023 0813   MCV 84.9 11/24/2023 0813   MCH 28.0 10/29/2020 0021   MCHC 32.6 11/24/2023 0813   RDW 13.9 11/24/2023 0813   Iron Studies No results found for: IRON, TIBC,  FERRITIN, IRONPCTSAT Lipid Panel     Component Value Date/Time   CHOL 184 08/03/2023 1006   CHOL 243 (H) 03/17/2023 1055   TRIG 103.0 08/03/2023 1006   HDL 52.70 08/03/2023 1006   HDL 68 03/17/2023 1055   CHOLHDL 3 08/03/2023 1006   VLDL 20.6 08/03/2023 1006   LDLCALC 110 (H) 08/03/2023 1006   LDLCALC 149 (H) 03/17/2023 1055   Hepatic Function Panel     Component Value Date/Time   PROT 6.6 08/03/2023 1006   PROT 6.6 04/20/2023 1136   ALBUMIN 4.1 08/03/2023 1006   ALBUMIN 3.9 02/29/2020 1010   AST 15 08/03/2023 1006   ALT 8 08/03/2023 1006   ALKPHOS 65 08/03/2023 1006   BILITOT 0.6 08/03/2023 1006   BILITOT 0.7 02/29/2020 1010      Component Value Date/Time   TSH 0.47 08/03/2023 1006   Nutritional Lab Results  Component Value Date   VD25OH 58.4 03/17/2023    Medications: Outpatient Encounter Medications as of 11/25/2023  Medication Sig Note   albuterol  (VENTOLIN  HFA) 108 (90 Base) MCG/ACT inhaler Inhale 2 puffs into the lungs every 4 (four) hours as needed for wheezing or shortness of breath. 05/08/2023: Pt takes as needed.   allopurinol  (ZYLOPRIM ) 100 MG tablet TAKE 1 TABLET(100 MG) BY MOUTH DAILY    Apoaequorin (PREVAGEN) 10 MG CAPS Take by mouth.    atorvastatin  (LIPITOR) 20 MG tablet Take 1 tablet (20 mg total) by mouth daily.    B Complex-Minerals (ELDERTONIC PO) Take 15 mLs by mouth daily. Geritol    blood glucose meter kit and supplies KIT Dispense based on patient and insurance preference. Use up to four times daily as directed.    Blood Pressure Monitoring (BLOOD PRESSURE CUFF) MISC For daily monitoring as directed.    Calcium -Magnesium -Vitamin D  (CALCIUM  1200+D3 PO) Take 600 mg by mouth daily.    carvedilol  (COREG ) 25 MG tablet Take 1 tablet (25 mg total) by mouth 2 (two) times daily.    cholecalciferol (VITAMIN D ) 25 MCG (1000 UNIT) tablet Take 1,000 Units by mouth at bedtime.     Cyanocobalamin  ([employer] B12 GUMMIES PO) Take 1,000 mcg by mouth daily.     doxycycline (VIBRAMYCIN) 100 MG capsule Take 1 capsule (100 mg total) by mouth 2 (two) times daily.    famotidine  (PEPCID ) 40 MG tablet Take 1 tablet (40 mg total) by mouth in the morning.    fluticasone  (FLONASE ) 50 MCG/ACT nasal spray Place 1 spray into both nostrils daily. 05/08/2023: Pt takes as needed.   ipratropium-albuterol  (DUONEB) 0.5-2.5 (3) MG/3ML SOLN Take 3 mLs by nebulization every 6 (six) hours as needed (shortness of breath/wheezing).  levocetirizine (XYZAL ) 5 MG tablet Take 1 tablet (5 mg total) by mouth every evening.    montelukast  (SINGULAIR ) 10 MG tablet TAKE 1 TABLET EVERY DAY    ONETOUCH ULTRA test strip USE TO TEST BLOOD SUGAR UP TO FOUR TIMES DAILY    potassium chloride  20 MEQ/15ML (10%) SOLN TAKE 15 MLS BY MOUTH DAILY AS NEEDED(WITH EACH DOSE OF TORSEMIDE )    sacubitril -valsartan  (ENTRESTO ) 49-51 MG Take 1 tablet by mouth 2 (two) times daily.    tirzepatide  (MOUNJARO ) 10 MG/0.5ML Pen Inject 10 mg into the skin once a week.    torsemide  (DEMADEX ) 20 MG tablet Take 1 tablet (20 mg total) by mouth daily as needed (swelling/fluid/weight gain). 05/08/2023: Pt takes as needed.   triamcinolone  cream (KENALOG ) 0.1 % Apply topically 2 (two) times daily.    vitamin C (ASCORBIC ACID) 500 MG tablet Take 500 mg by mouth daily.    [DISCONTINUED] tirzepatide  (MOUNJARO ) 10 MG/0.5ML Pen Inject 10 mg into the skin once a week.    No facility-administered encounter medications on file as of 11/25/2023.     Follow-Up   Return in about 4 weeks (around 12/23/2023) for For Weight Mangement with Dr. Francyne.SABRA She was informed of the importance of frequent follow up visits to maximize her success with intensive lifestyle modifications for her multiple health conditions.  Attestation Statement   Reviewed by clinician on day of visit: allergies, medications, problem list, medical history, surgical history, family history, social history, and previous encounter notes.     Lucas Francyne, MD

## 2023-11-25 NOTE — Assessment & Plan Note (Signed)
 Her A1c has improved significantly from 7.2 in 2021 to 5.8, indicating good control likely due to weight loss and dietary changes and treatment with GLP-1. -Continue Mounjaro  10 mg once a week - Continue current diabetes management plan.  Blood pressure has also improved still little bit above target but better.

## 2023-11-25 NOTE — Assessment & Plan Note (Signed)
 She has lost 18 pounds on medically supervised weight management plan inclusive of GLP-1.  She is undergoing medical weight management and has lost three pounds since the last visit, achieving a weight of 202 pounds from 227 pounds in April last year. She follows a 1200 calorie meal plan with low adherence, occasionally skips meals, and does not exercise. Her weight loss is gradual, expected given her age and race. She is on a medication regimen aiding weight loss without side effects and is comfortable with the current dose. - Continue Mounjaro  at 10 mg once a week and send prescription to [facility] with a refill. - Encourage adherence to the 1200 calorie meal plan and tracking of food intake. - Emphasize the importance of protein intake from meats, eggs, beans, shrimp, and salmon.

## 2023-12-02 ENCOUNTER — Ambulatory Visit: Admitting: Podiatry

## 2023-12-02 DIAGNOSIS — I96 Gangrene, not elsewhere classified: Secondary | ICD-10-CM

## 2023-12-02 NOTE — Progress Notes (Signed)
 Subjective:  Patient ID: Beth Macias, female    DOB: 1950/11/26,  MRN: [id number]  Chief Complaint  Patient presents with   Toe Pain    73 y.o. female presents with the above complaint.  Patient presents with right second digit discoloration.  Patient states that she wanted to get it evaluated is causing her some toe pain.  She wants to make sure is not gangrene.  She is a diabetic.  She denies seeing anyone else prior to seeing me denies any other acute complaints pain scale 7 out of 10 dull aching nature   Review of Systems: Negative except as noted in the HPI. Denies N/V/F/Ch.  Past Medical History:  Diagnosis Date   Anxiety    Asthma    Back pain    Blood in stool    Chewing difficulty    CHF (congestive heart failure) (HCC)    Colon polyps    Constipation    Diabetes mellitus without complication (HCC)    Diverticulitis    Fibromyalgia    GERD (gastroesophageal reflux disease)    Gout    Hypercholesteremia    Hypertension    Joint pain    Lactose intolerance    Pneumonia    Pneumonia    Prediabetes    SOB (shortness of breath)    UTI (urinary tract infection)    Vitamin B 12 deficiency    Vitamin D  deficiency     Current Outpatient Medications:    albuterol  (VENTOLIN  HFA) 108 (90 Base) MCG/ACT inhaler, Inhale 2 puffs into the lungs every 4 (four) hours as needed for wheezing or shortness of breath., Disp: 8.5 each, Rfl: 5   allopurinol  (ZYLOPRIM ) 100 MG tablet, TAKE 1 TABLET(100 MG) BY MOUTH DAILY, Disp: 30 tablet, Rfl: 6   Apoaequorin (PREVAGEN) 10 MG CAPS, Take by mouth., Disp: , Rfl:    atorvastatin  (LIPITOR) 20 MG tablet, Take 1 tablet (20 mg total) by mouth daily., Disp: 90 tablet, Rfl: 0   B Complex-Minerals (ELDERTONIC PO), Take 15 mLs by mouth daily. Geritol, Disp: , Rfl:    blood glucose meter kit and supplies KIT, Dispense based on patient and insurance preference. Use up to four times daily as directed., Disp: 1 each, Rfl: 0   Blood Pressure  Monitoring (BLOOD PRESSURE CUFF) MISC, For daily monitoring as directed., Disp: 1 each, Rfl: 0   Calcium -Magnesium -Vitamin D  (CALCIUM  1200+D3 PO), Take 600 mg by mouth daily., Disp: , Rfl:    carvedilol  (COREG ) 25 MG tablet, Take 1 tablet (25 mg total) by mouth 2 (two) times daily., Disp: 180 tablet, Rfl: 3   cholecalciferol (VITAMIN D ) 25 MCG (1000 UNIT) tablet, Take 1,000 Units by mouth at bedtime. , Disp: , Rfl:    Cyanocobalamin  (CVS B12 GUMMIES PO), Take 1,000 mcg by mouth daily., Disp: , Rfl:    doxycycline  (VIBRAMYCIN ) 100 MG capsule, Take 1 capsule (100 mg total) by mouth 2 (two) times daily., Disp: 14 capsule, Rfl: 0   famotidine  (PEPCID ) 40 MG tablet, Take 1 tablet (40 mg total) by mouth in the morning., Disp: 90 tablet, Rfl: 3   fluticasone  (FLONASE ) 50 MCG/ACT nasal spray, Place 1 spray into both nostrils daily., Disp: 16 g, Rfl: 6   ipratropium-albuterol  (DUONEB) 0.5-2.5 (3) MG/3ML SOLN, Take 3 mLs by nebulization every 6 (six) hours as needed (shortness of breath/wheezing)., Disp: , Rfl:    levocetirizine (XYZAL ) 5 MG tablet, Take 1 tablet (5 mg total) by mouth every evening., Disp: 90 tablet, Rfl:  1   montelukast  (SINGULAIR ) 10 MG tablet, TAKE 1 TABLET EVERY DAY, Disp: 90 tablet, Rfl: 3   ONETOUCH ULTRA test strip, USE TO TEST BLOOD SUGAR UP TO FOUR TIMES DAILY, Disp: 100 strip, Rfl: 3   potassium chloride  20 MEQ/15ML (10%) SOLN, TAKE 15 MLS BY MOUTH DAILY AS NEEDED(WITH EACH DOSE OF TORSEMIDE ), Disp: 1373 mL, Rfl: 3   sacubitril -valsartan  (ENTRESTO ) 49-51 MG, Take 1 tablet by mouth 2 (two) times daily., Disp: 180 tablet, Rfl: 3   tirzepatide  (MOUNJARO ) 10 MG/0.5ML Pen, Inject 10 mg into the skin once a week., Disp: 2 mL, Rfl: 1   torsemide  (DEMADEX ) 20 MG tablet, Take 1 tablet (20 mg total) by mouth daily as needed (swelling/fluid/weight gain)., Disp: 90 tablet, Rfl: 1   triamcinolone  cream (KENALOG ) 0.1 %, Apply topically 2 (two) times daily., Disp: , Rfl:    vitamin C (ASCORBIC  ACID) 500 MG tablet, Take 500 mg by mouth daily., Disp: , Rfl:   Social History   Tobacco Use  Smoking Status Former   Current packs/day: 0.00   Average packs/day: 0.3 packs/day for 9.0 years (2.3 ttl pk-yrs)   Types: Cigarettes   Start date: 05/27/1967   Quit date: 05/26/1976   Years since quitting: 47.5  Smokeless Tobacco Never    Allergies  Allergen Reactions   Strawberry (Diagnostic) Anaphylaxis and Other (See Comments)    hives   Farxiga  [Dapagliflozin ] Other (See Comments)    Had HAs, weakness, rhinorrhea, jitteriness, nasal congestion, nocturia, yeast infections, memory changes, irritability.   Penicillins Itching    Has patient had a PCN reaction causing immediate rash, facial/tongue/throat swelling, SOB or lightheadedness with hypotension: No Has patient had a PCN reaction causing severe rash involving mucus membranes or skin necrosis: No Has patient had a PCN reaction that required hospitalization: Unknown Has patient had a PCN reaction occurring within the last 10 years: No If all of the above answers are NO, then may proceed with Cephalosporin use.   Amoxicillin Other (See Comments)   Rosuvastatin  Other (See Comments)    Bloating, flatulence   Gabapentin Itching, Rash and Other (See Comments)    Incoherent   Tape Other (See Comments)    Blisters   Objective:  There were no vitals filed for this visit. There is no height or weight on file to calculate BMI. Constitutional Well developed. Well nourished.  Vascular Dorsalis pedis pulses palpable bilaterally. Posterior tibial pulses palpable bilaterally. Capillary refill normal to all digits.  No cyanosis or clubbing noted. Pedal hair growth normal.  Neurologic Normal speech. Oriented to person, place, and time. Epicritic sensation to light touch grossly present bilaterally.  Dermatologic Right second digit discoloration noted likely due to microtrauma.  No signs of signs of infection noted no signs of bleeding  noted.  No open wounds or lesion noted  Orthopedic: Normal joint ROM without pain or crepitus bilaterally. No visible deformities. No bony tenderness.   Radiographs: None Assessment:   1. Gangrene of toe of right foot (HCC)    Plan:  Patient was evaluated and treated and all questions answered.  Right second digit skin discoloration likely due to microtrauma -Questions or concerns were discussed with the patient in extensive detail at this time of my concern for any signs of gangrene.  She has palpable pedal pulses therefore no signs of gangrene noted.  If any foot and ankle issues are future she will come back and see me.  No follow-ups on file.

## 2023-12-08 DIAGNOSIS — S52045A Nondisplaced fracture of coronoid process of left ulna, initial encounter for closed fracture: Secondary | ICD-10-CM | POA: Diagnosis not present

## 2023-12-08 DIAGNOSIS — M1812 Unilateral primary osteoarthritis of first carpometacarpal joint, left hand: Secondary | ICD-10-CM | POA: Diagnosis not present

## 2023-12-28 ENCOUNTER — Other Ambulatory Visit: Payer: Self-pay

## 2023-12-28 DIAGNOSIS — J3089 Other allergic rhinitis: Secondary | ICD-10-CM

## 2023-12-28 DIAGNOSIS — J4540 Moderate persistent asthma, uncomplicated: Secondary | ICD-10-CM

## 2023-12-28 MED ORDER — MONTELUKAST SODIUM 10 MG PO TABS: 10.0000 mg | ORAL_TABLET | Freq: Every day | ORAL | 3 refills | Status: DC

## 2023-12-30 ENCOUNTER — Ambulatory Visit (INDEPENDENT_AMBULATORY_CARE_PROVIDER_SITE_OTHER): Admitting: Internal Medicine

## 2023-12-30 ENCOUNTER — Encounter (INDEPENDENT_AMBULATORY_CARE_PROVIDER_SITE_OTHER): Payer: Self-pay | Admitting: Internal Medicine

## 2023-12-30 VITALS — BP 136/86 | HR 88 | Temp 98.2°F | Ht 59.0 in | Wt 200.0 lb

## 2023-12-30 DIAGNOSIS — Z6841 Body Mass Index (BMI) 40.0 and over, adult: Secondary | ICD-10-CM

## 2023-12-30 DIAGNOSIS — Z7985 Long-term (current) use of injectable non-insulin antidiabetic drugs: Secondary | ICD-10-CM

## 2023-12-30 DIAGNOSIS — I152 Hypertension secondary to endocrine disorders: Secondary | ICD-10-CM

## 2023-12-30 DIAGNOSIS — E785 Hyperlipidemia, unspecified: Secondary | ICD-10-CM | POA: Diagnosis not present

## 2023-12-30 DIAGNOSIS — E66813 Obesity, class 3: Secondary | ICD-10-CM

## 2023-12-30 DIAGNOSIS — E669 Obesity, unspecified: Secondary | ICD-10-CM

## 2023-12-30 DIAGNOSIS — I5032 Chronic diastolic (congestive) heart failure: Secondary | ICD-10-CM

## 2023-12-30 DIAGNOSIS — E1159 Type 2 diabetes mellitus with other circulatory complications: Secondary | ICD-10-CM | POA: Diagnosis not present

## 2023-12-30 DIAGNOSIS — E1169 Type 2 diabetes mellitus with other specified complication: Secondary | ICD-10-CM

## 2023-12-30 MED ORDER — TIRZEPATIDE 10 MG/0.5ML ~~LOC~~ SOAJ: 10.0000 mg | SUBCUTANEOUS | 1 refills | Status: DC

## 2023-12-30 NOTE — Progress Notes (Unsigned)
 Office: [phone]  /  Fax: [phone]  Weight Summary and Body Composition Analysis (BIA)  Vitals Temp: 98.2 F (36.8 C) BP: 136/86 Pulse Rate: 88 SpO2: 97 %   Anthropometric Measurements Height: 4' 11 (1.499 m) Weight: 200 lb (90.7 kg) BMI (Calculated): 40.37 Weight at Last Visit: 202 lb Weight Lost Since Last Visit: 2 lb Weight Gained Since Last Visit: 0 lb Starting Weight: 220 l Total Weight Loss (lbs): 20 lb (9.072 kg) Peak Weight: 230 lb   Body Composition  Body Fat %: 54.4 % Fat Mass (lbs): 109.2 lbs Muscle Mass (lbs): 86.8 lbs Visceral Fat Rating : 19    RMR: 1771  Today's Visit #: 10  Starting Date: 03/17/23   Subjective   Chief Complaint: Obesity  Interval History Discussed the use of AI scribe software for clinical note transcription with the patient, who gave verbal consent to proceed.  History of Present Illness   Beth Macias is a 73 year old female with hypertension, diastolic heart failure, and type two diabetes who presents for medical weight management.  She adheres to a 1200 calorie nutrition plan, focusing on whole foods, adequate protein intake, and hydration. Despite this, she occasionally skips meals and is not currently engaging in any exercise, acknowledging low physical activity levels. She has successfully lost 20 pounds recently.  Her diabetes management has improved, with her A1c decreasing from 7.2 to 5.8, indicating adequate glycemic control. She has a history of statin intolerance, having not tolerated rosuvastatin , and is currently on atorvastatin .  She experiences emotional highs and lows, particularly related to family stressors, including recent deaths in the family and caregiving responsibilities. She uses sweets as a comfort during stress and is considering returning to therapy to address these emotional challenges.  Her family history includes a cousin who passed away at 53 and another cousin who was shot and  killed. She also cares for her ex-husband's 67 year old aunt and has been involved in trying to manage her ex-husband's care after his stroke.       Challenges affecting patient progress: low volume of physical activity at present , medical comorbidities, slow metabolism for age, and menopause.    Pharmacotherapy for weight management: She is currently taking Monjauro with diabetes as the primary indication with adequate clinical response  and without side effects..   Assessment and Plan   Treatment Plan For Obesity:  Recommended Dietary Goals  Beth Macias is currently in the action stage of change. As such, her goal is to continue weight management plan. She has agreed to: continue current plan  Behavioral Health and Counseling  We discussed the following behavioral modification strategies today: continue to work on maintaining a reduced calorie state, getting the recommended amount of protein, incorporating whole foods, making healthy choices, staying well hydrated and practicing mindfulness when eating..  Additional education and resources provided today: None  Recommended Physical Activity Goals  Beth Macias has been advised to work up to 150 minutes of moderate intensity aerobic activity a week and strengthening exercises 2-3 times per week for cardiovascular health, weight loss maintenance and preservation of muscle mass.   She has agreed to :  Think about enjoyable ways to increase daily physical activity and overcoming barriers to exercise and Increase physical activity in their day and reduce sedentary time (increase NEAT).  Medical Interventions and Pharmacotherapy  We discussed various medication options to help Beth Macias with her weight loss efforts and we both agreed to : Adequate clinical response to anti-obesity medication, continue  current regimen  Associated Conditions Impacted by Obesity Treatment  Assessment & Plan Type 2 diabetes mellitus with obesity (HCC) Her  A1c has improved significantly from 7.2 in 2021 to 5.8, indicating good control likely due to weight loss and dietary changes and treatment with GLP-1. -Continue Mounjaro  10 mg once a week - Continue current diabetes management plan.  Blood pressure has also improved still little bit above target but better. Chronic heart failure with preserved ejection fraction (HCC) Reviewed echocardiogram from 2022.  She has LVH with grade 1 diastolic dysfunction.  She has an elevated BNP.  She is followed by cardiology and is currently on Entresto , carvedilol , amlodipine  and as needed use of torsemide .  She lacks self-monitoring.  Her blood pressure is above target but control is improving.   Patient counseled on adherence, self-monitoring, maintain a diet low in sodium.   She is now on GLP-1 therapy which has been shown to be beneficial in heart failure with preserved ejection fraction. Hypertension associated with type 2 diabetes mellitus (HCC) Blood pressure control improving.   Continue current regimen. Class 3 severe obesity with serious comorbidity and body mass index (BMI) of 40.0 to 44.9 in adult  She has lost 20 pounds, indicating progress. Adheres to a 1200 calorie nutrition plan but struggles with emotional eating due to stress and emotional distress. Plans to engage in therapy to address emotional eating triggers. - Continue 1200 calorie nutrition plan - Encourage increased physical activity, such as walking or gym visits - Refer to therapist for emotional support and coping strategies - Continue current medication regimen for appetite control - Send medication refill to [name] in [place] Hyperlipidemia associated with type 2 diabetes mellitus (HCC) She had intolerance to rosuvastatin  in the past but seems to be tolerating atorvastatin  without any reported side effects we will continue statin therapy for cardiovascular risk reduction.     Physical inactivity Physical inactivity noted.  Encouraged to increase physical activity to improve overall health and aid in weight management. Discussed the benefits of exercise for mental health and longevity. - Encourage increased physical activity, such as walking or gym visits  Emotional distress related to bereavement and caregiving Significant emotional distress related to recent bereavements and caregiving responsibilities. Plans to seek therapy to address emotional distress and establish healthy boundaries. Emotional eating identified as a coping mechanism. - Refer to therapist for emotional support and coping strategies      Objective   Physical Exam:  Blood pressure 136/86, pulse 88, temperature 98.2 F (36.8 C), height 4' 11 (1.499 m), weight 200 lb (90.7 kg), SpO2 97%. Body mass index is 40.4 kg/m.  General: She is overweight, cooperative, alert, well developed, and in no acute distress. PSYCH: Has normal mood, affect and thought process.   HEENT: EOMI, sclerae are anicteric. Lungs: Normal breathing effort, no conversational dyspnea. Extremities: No edema.  Neurologic: No gross sensory or motor deficits. No tremors or fasciculations noted.    Diagnostic Data Reviewed:  BMET    Component Value Date/Time   NA 142 08/03/2023 1006   NA 145 (H) 03/04/2023 1102   K 3.3 (L) 08/03/2023 1006   CL 105 08/03/2023 1006   CO2 28 08/03/2023 1006   GLUCOSE 114 (H) 08/03/2023 1006   BUN 23 08/03/2023 1006   BUN 15 03/04/2023 1102   CREATININE 0.87 08/03/2023 1006   CALCIUM  10.2 08/03/2023 1006   GFRNONAA 60 (L) 11/06/2020 1114   GFRAA 86 02/29/2020 1010   Lab Results  Component Value Date  HGBA1C 5.8 11/24/2023   HGBA1C 7.2 (H) 06/29/2019   Lab Results  Component Value Date   INSULIN  19.7 03/17/2023   Lab Results  Component Value Date   TSH 0.47 08/03/2023   CBC    Component Value Date/Time   WBC 5.4 11/24/2023 0813   RBC 4.55 11/24/2023 0813   HGB 12.6 11/24/2023 0813   HCT 38.6 11/24/2023 0813    HCT 38.2 03/04/2023 1102   PLT 278.0 11/24/2023 0813   MCV 84.9 11/24/2023 0813   MCH 28.0 10/29/2020 0021   MCHC 32.6 11/24/2023 0813   RDW 13.9 11/24/2023 0813   Iron Studies No results found for: IRON, TIBC, FERRITIN, IRONPCTSAT Lipid Panel     Component Value Date/Time   CHOL 184 08/03/2023 1006   CHOL 243 (H) 03/17/2023 1055   TRIG 103.0 08/03/2023 1006   HDL 52.70 08/03/2023 1006   HDL 68 03/17/2023 1055   CHOLHDL 3 08/03/2023 1006   VLDL 20.6 08/03/2023 1006   LDLCALC 110 (H) 08/03/2023 1006   LDLCALC 149 (H) 03/17/2023 1055   Hepatic Function Panel     Component Value Date/Time   PROT 6.6 08/03/2023 1006   PROT 6.6 04/20/2023 1136   ALBUMIN 4.1 08/03/2023 1006   ALBUMIN 3.9 02/29/2020 1010   AST 15 08/03/2023 1006   ALT 8 08/03/2023 1006   ALKPHOS 65 08/03/2023 1006   BILITOT 0.6 08/03/2023 1006   BILITOT 0.7 02/29/2020 1010      Component Value Date/Time   TSH 0.47 08/03/2023 1006   Nutritional Lab Results  Component Value Date   VD25OH 58.4 03/17/2023    Medications: Outpatient Encounter Medications as of 12/30/2023  Medication Sig Note   albuterol  (VENTOLIN  HFA) 108 (90 Base) MCG/ACT inhaler Inhale 2 puffs into the lungs every 4 (four) hours as needed for wheezing or shortness of breath. 05/08/2023: Pt takes as needed.   allopurinol  (ZYLOPRIM ) 100 MG tablet TAKE 1 TABLET(100 MG) BY MOUTH DAILY    Apoaequorin (PREVAGEN) 10 MG CAPS Take by mouth.    atorvastatin  (LIPITOR) 20 MG tablet Take 1 tablet (20 mg total) by mouth daily.    B Complex-Minerals (ELDERTONIC PO) Take 15 mLs by mouth daily. Geritol    blood glucose meter kit and supplies KIT Dispense based on patient and insurance preference. Use up to four times daily as directed.    Blood Pressure Monitoring (BLOOD PRESSURE CUFF) MISC For daily monitoring as directed.    Calcium -Magnesium -Vitamin D  (CALCIUM  1200+D3 PO) Take 600 mg by mouth daily.    carvedilol  (COREG ) 25 MG tablet Take 1  tablet (25 mg total) by mouth 2 (two) times daily.    cholecalciferol (VITAMIN D ) 25 MCG (1000 UNIT) tablet Take 1,000 Units by mouth at bedtime.     Cyanocobalamin  ([employer] B12 GUMMIES PO) Take 1,000 mcg by mouth daily.    doxycycline  (VIBRAMYCIN ) 100 MG capsule Take 1 capsule (100 mg total) by mouth 2 (two) times daily.    famotidine  (PEPCID ) 40 MG tablet Take 1 tablet (40 mg total) by mouth in the morning.    fluticasone  (FLONASE ) 50 MCG/ACT nasal spray Place 1 spray into both nostrils daily. 05/08/2023: Pt takes as needed.   ipratropium-albuterol  (DUONEB) 0.5-2.5 (3) MG/3ML SOLN Take 3 mLs by nebulization every 6 (six) hours as needed (shortness of breath/wheezing).    levocetirizine (XYZAL ) 5 MG tablet Take 1 tablet (5 mg total) by mouth every evening.    montelukast  (SINGULAIR ) 10 MG tablet Take 1 tablet (10  mg total) by mouth daily.    ONETOUCH ULTRA test strip USE TO TEST BLOOD SUGAR UP TO FOUR TIMES DAILY    potassium chloride  20 MEQ/15ML (10%) SOLN TAKE 15 MLS BY MOUTH DAILY AS NEEDED(WITH EACH DOSE OF TORSEMIDE )    sacubitril -valsartan  (ENTRESTO ) 49-51 MG Take 1 tablet by mouth 2 (two) times daily.    torsemide  (DEMADEX ) 20 MG tablet Take 1 tablet (20 mg total) by mouth daily as needed (swelling/fluid/weight gain). 05/08/2023: Pt takes as needed.   triamcinolone  cream (KENALOG ) 0.1 % Apply topically 2 (two) times daily.    vitamin C (ASCORBIC ACID) 500 MG tablet Take 500 mg by mouth daily.    [DISCONTINUED] tirzepatide  (MOUNJARO ) 10 MG/0.5ML Pen Inject 10 mg into the skin once a week.    tirzepatide  (MOUNJARO ) 10 MG/0.5ML Pen Inject 10 mg into the skin once a week.    No facility-administered encounter medications on file as of 12/30/2023.     Follow-Up   Return in about 4 weeks (around 01/27/2024) for For Weight Mangement with Dr. Francyne.SABRA She was informed of the importance of frequent follow up visits to maximize her success with intensive lifestyle modifications for her multiple  health conditions.  Attestation Statement   Reviewed by clinician on day of visit: allergies, medications, problem list, medical history, surgical history, family history, social history, and previous encounter notes.     Lucas Francyne, MD

## 2023-12-31 NOTE — Assessment & Plan Note (Signed)
 She had intolerance to rosuvastatin  in the past but seems to be tolerating atorvastatin  without any reported side effects we will continue statin therapy for cardiovascular risk reduction.

## 2023-12-31 NOTE — Assessment & Plan Note (Signed)
 She has lost 20 pounds, indicating progress. Adheres to a 1200 calorie nutrition plan but struggles with emotional eating due to stress and emotional distress. Plans to engage in therapy to address emotional eating triggers. - Continue 1200 calorie nutrition plan - Encourage increased physical activity, such as walking or gym visits - Refer to therapist for emotional support and coping strategies - Continue current medication regimen for appetite control - Send medication refill to [name] in [place]

## 2023-12-31 NOTE — Assessment & Plan Note (Signed)
 Her A1c has improved significantly from 7.2 in 2021 to 5.8, indicating good control likely due to weight loss and dietary changes and treatment with GLP-1. -Continue Mounjaro  10 mg once a week - Continue current diabetes management plan.  Blood pressure has also improved still little bit above target but better.

## 2023-12-31 NOTE — Assessment & Plan Note (Signed)
 Reviewed echocardiogram from 2022.  She has LVH with grade 1 diastolic dysfunction.  She has an elevated BNP.  She is followed by cardiology and is currently on Entresto , carvedilol , amlodipine  and as needed use of torsemide .  She lacks self-monitoring.  Her blood pressure is above target but control is improving.   Patient counseled on adherence, self-monitoring, maintain a diet low in sodium.   She is now on GLP-1 therapy which has been shown to be beneficial in heart failure with preserved ejection fraction.

## 2023-12-31 NOTE — Assessment & Plan Note (Signed)
 Blood pressure control improving.   Continue current regimen.

## 2024-01-11 ENCOUNTER — Other Ambulatory Visit (INDEPENDENT_AMBULATORY_CARE_PROVIDER_SITE_OTHER): Payer: Self-pay | Admitting: Internal Medicine

## 2024-01-11 DIAGNOSIS — Z9189 Other specified personal risk factors, not elsewhere classified: Secondary | ICD-10-CM

## 2024-01-15 ENCOUNTER — Telehealth: Payer: Self-pay

## 2024-01-15 ENCOUNTER — Other Ambulatory Visit: Payer: Self-pay

## 2024-01-15 MED ORDER — ALLOPURINOL 100 MG PO TABS: 100.0000 mg | ORAL_TABLET | Freq: Every day | ORAL | 6 refills | Status: AC

## 2024-01-15 NOTE — Telephone Encounter (Signed)
 Received faxed refill request from [facility] [address] [place] Allopurinol  100 mg # 30, 1 tablet by mouth daily. Is this OK to fill?

## 2024-01-25 DIAGNOSIS — E119 Type 2 diabetes mellitus without complications: Secondary | ICD-10-CM | POA: Diagnosis not present

## 2024-02-02 ENCOUNTER — Ambulatory Visit (INDEPENDENT_AMBULATORY_CARE_PROVIDER_SITE_OTHER): Admitting: Internal Medicine

## 2024-02-02 ENCOUNTER — Encounter (INDEPENDENT_AMBULATORY_CARE_PROVIDER_SITE_OTHER): Payer: Self-pay | Admitting: Internal Medicine

## 2024-02-02 VITALS — BP 148/90 | HR 79 | Temp 98.1°F | Ht 59.0 in | Wt 195.0 lb

## 2024-02-02 DIAGNOSIS — I152 Hypertension secondary to endocrine disorders: Secondary | ICD-10-CM

## 2024-02-02 DIAGNOSIS — Z7985 Long-term (current) use of injectable non-insulin antidiabetic drugs: Secondary | ICD-10-CM

## 2024-02-02 DIAGNOSIS — E1159 Type 2 diabetes mellitus with other circulatory complications: Secondary | ICD-10-CM | POA: Diagnosis not present

## 2024-02-02 DIAGNOSIS — E669 Obesity, unspecified: Secondary | ICD-10-CM

## 2024-02-02 DIAGNOSIS — Z9189 Other specified personal risk factors, not elsewhere classified: Secondary | ICD-10-CM

## 2024-02-02 DIAGNOSIS — I5032 Chronic diastolic (congestive) heart failure: Secondary | ICD-10-CM | POA: Diagnosis not present

## 2024-02-02 DIAGNOSIS — Z6839 Body mass index (BMI) 39.0-39.9, adult: Secondary | ICD-10-CM | POA: Insufficient documentation

## 2024-02-02 DIAGNOSIS — E1169 Type 2 diabetes mellitus with other specified complication: Secondary | ICD-10-CM

## 2024-02-02 DIAGNOSIS — Z723 Lack of physical exercise: Secondary | ICD-10-CM

## 2024-02-02 MED ORDER — TIRZEPATIDE 10 MG/0.5ML ~~LOC~~ SOAJ: 10.0000 mg | SUBCUTANEOUS | 1 refills | Status: DC

## 2024-02-02 MED ORDER — ATORVASTATIN CALCIUM 20 MG PO TABS: 20.0000 mg | ORAL_TABLET | Freq: Every day | ORAL | 0 refills | Status: AC

## 2024-02-02 NOTE — Progress Notes (Unsigned)
 Office: [phone]  /  Fax: [phone]  Weight Summary and Body Composition Analysis (BIA)  Vitals Temp: 98.1 F (36.7 C) BP: (!) 170/94 Pulse Rate: 79 SpO2: 94 %   Anthropometric Measurements Height: 4' 11 (1.499 m) Weight: 195 lb (88.5 kg) BMI (Calculated): 39.36 Weight at Last Visit: 200 lb Weight Lost Since Last Visit: 5 lb Weight Gained Since Last Visit: 0 lb Starting Weight: 220 lb Total Weight Loss (lbs): 25 lb (11.3 kg) Peak Weight: 230 lb   Body Composition  Body Fat %: 54.2 % Fat Mass (lbs): 106 lbs Muscle Mass (lbs): 89.6 lbs Visceral Fat Rating : 18    RMR: 1774  Today's Visit #: 11  Starting Date: 03/17/23   Subjective   Chief Complaint: Obesity  Interval History Discussed the use of AI scribe software for clinical note transcription with the patient, who gave verbal consent to proceed.  History of Present Illness      Challenges affecting patient progress: {EMOBESITYBARRIERS:28841::none}.    Pharmacotherapy for weight management: She is currently taking {EMPharmaco:28845}.   Assessment and Plan   Treatment Plan For Obesity:  Recommended Dietary Goals  Alizaya is currently in the action stage of change. As such, her goal is to continue weight management plan. She has agreed to: {EMWTLOSSPLAN:29297::continue current plan}  Behavioral Health and Counseling  We discussed the following behavioral modification strategies today: {EMWMwtlossstrategies:28914::continue to work on maintaining a reduced calorie state, getting the recommended amount of protein, incorporating whole foods, making healthy choices, staying well hydrated and practicing mindfulness when eating.,increase protein intake, fibrous foods (25 grams per day for women, 30 grams for men) and water to improve satiety and decrease hunger signals. }.  Additional education and resources provided today: {EMadditionalresources:29169::None}  Recommended Physical  Activity Goals  Anijah has been advised to work up to 150 minutes of moderate intensity aerobic activity a week and strengthening exercises 2-3 times per week for cardiovascular health, weight loss maintenance and preservation of muscle mass.  She has agreed to :  {EMEXERCISE:28847::Think about enjoyable ways to increase daily physical activity and overcoming barriers to exercise,Increase physical activity in their day and reduce sedentary time (increase NEAT).,Increase volume of physical activity to a goal of 240 minutes a week,Combine aerobic and strengthening exercises for efficiency and improved cardiometabolic health.}  Medical Interventions and Pharmacotherapy  We discussed various medication options to help Arthella with her weight loss efforts and we both agreed to : {EMagreedrx:29170}  Associated Conditions Impacted by Obesity Treatment  Assessment & Plan At high risk for cardiovascular disease  Hypertension associated with type 2 diabetes mellitus (HCC)  Type 2 diabetes mellitus with obesity (HCC)  Generalized obesity starting BMI  44.5  BMI 39.0-39.9,adult     Assessment and Plan Assessment & Plan       Objective   Physical Exam:  Blood pressure (!) 170/94, pulse 79, temperature 98.1 F (36.7 C), height 4' 11 (1.499 m), weight 195 lb (88.5 kg), SpO2 94%. Body mass index is 39.39 kg/m.  General: She is overweight, cooperative, alert, well developed, and in no acute distress. PSYCH: Has normal mood, affect and thought process.   HEENT: EOMI, sclerae are anicteric. Lungs: Normal breathing effort, no conversational dyspnea. Extremities: No edema.  Neurologic: No gross sensory or motor deficits. No tremors or fasciculations noted.    Diagnostic Data Reviewed:  BMET    Component Value Date/Time   NA 142 08/03/2023 1006   NA 145 (H) 03/04/2023 1102   K 3.3 (L)  08/03/2023 1006   CL 105 08/03/2023 1006   CO2 28 08/03/2023 1006   GLUCOSE 114 (H)  08/03/2023 1006   BUN 23 08/03/2023 1006   BUN 15 03/04/2023 1102   CREATININE 0.87 08/03/2023 1006   CALCIUM  10.2 08/03/2023 1006   GFRNONAA 60 (L) 11/06/2020 1114   GFRAA 86 02/29/2020 1010   Lab Results  Component Value Date   HGBA1C 5.8 11/24/2023   HGBA1C 7.2 (H) 06/29/2019   Lab Results  Component Value Date   INSULIN  19.7 03/17/2023   Lab Results  Component Value Date   TSH 0.47 08/03/2023   CBC    Component Value Date/Time   WBC 5.4 11/24/2023 0813   RBC 4.55 11/24/2023 0813   HGB 12.6 11/24/2023 0813   HCT 38.6 11/24/2023 0813   HCT 38.2 03/04/2023 1102   PLT 278.0 11/24/2023 0813   MCV 84.9 11/24/2023 0813   MCH 28.0 10/29/2020 0021   MCHC 32.6 11/24/2023 0813   RDW 13.9 11/24/2023 0813   Iron Studies No results found for: IRON, TIBC, FERRITIN, IRONPCTSAT Lipid Panel     Component Value Date/Time   CHOL 184 08/03/2023 1006   CHOL 243 (H) 03/17/2023 1055   TRIG 103.0 08/03/2023 1006   HDL 52.70 08/03/2023 1006   HDL 68 03/17/2023 1055   CHOLHDL 3 08/03/2023 1006   VLDL 20.6 08/03/2023 1006   LDLCALC 110 (H) 08/03/2023 1006   LDLCALC 149 (H) 03/17/2023 1055   Hepatic Function Panel     Component Value Date/Time   PROT 6.6 08/03/2023 1006   PROT 6.6 04/20/2023 1136   ALBUMIN 4.1 08/03/2023 1006   ALBUMIN 3.9 02/29/2020 1010   AST 15 08/03/2023 1006   ALT 8 08/03/2023 1006   ALKPHOS 65 08/03/2023 1006   BILITOT 0.6 08/03/2023 1006   BILITOT 0.7 02/29/2020 1010      Component Value Date/Time   TSH 0.47 08/03/2023 1006   Nutritional Lab Results  Component Value Date   VD25OH 58.4 03/17/2023    Medications: Outpatient Encounter Medications as of 02/02/2024  Medication Sig Note   albuterol  (VENTOLIN  HFA) 108 (90 Base) MCG/ACT inhaler Inhale 2 puffs into the lungs every 4 (four) hours as needed for wheezing or shortness of breath. 05/08/2023: Pt takes as needed.   allopurinol  (ZYLOPRIM ) 100 MG tablet Take 1 tablet (100 mg total) by  mouth daily.    Apoaequorin (PREVAGEN) 10 MG CAPS Take by mouth.    atorvastatin  (LIPITOR) 20 MG tablet Take 1 tablet (20 mg total) by mouth daily.    B Complex-Minerals (ELDERTONIC PO) Take 15 mLs by mouth daily. Geritol    blood glucose meter kit and supplies KIT Dispense based on patient and insurance preference. Use up to four times daily as directed.    Blood Pressure Monitoring (BLOOD PRESSURE CUFF) MISC For daily monitoring as directed.    Calcium -Magnesium -Vitamin D  (CALCIUM  1200+D3 PO) Take 600 mg by mouth daily.    carvedilol  (COREG ) 25 MG tablet Take 1 tablet (25 mg total) by mouth 2 (two) times daily.    cholecalciferol (VITAMIN D ) 25 MCG (1000 UNIT) tablet Take 1,000 Units by mouth at bedtime.     Cyanocobalamin  (CVS B12 GUMMIES PO) Take 1,000 mcg by mouth daily.    doxycycline  (VIBRAMYCIN ) 100 MG capsule Take 1 capsule (100 mg total) by mouth 2 (two) times daily.    famotidine  (PEPCID ) 40 MG tablet Take 1 tablet (40 mg total) by mouth in the morning.  fluticasone  (FLONASE ) 50 MCG/ACT nasal spray Place 1 spray into both nostrils daily. 05/08/2023: Pt takes as needed.   ipratropium-albuterol  (DUONEB) 0.5-2.5 (3) MG/3ML SOLN Take 3 mLs by nebulization every 6 (six) hours as needed (shortness of breath/wheezing).    levocetirizine (XYZAL ) 5 MG tablet Take 1 tablet (5 mg total) by mouth every evening.    montelukast  (SINGULAIR ) 10 MG tablet Take 1 tablet (10 mg total) by mouth daily.    ONETOUCH ULTRA test strip USE TO TEST BLOOD SUGAR UP TO FOUR TIMES DAILY    potassium chloride  20 MEQ/15ML (10%) SOLN TAKE 15 MLS BY MOUTH DAILY AS NEEDED(WITH EACH DOSE OF TORSEMIDE )    sacubitril -valsartan  (ENTRESTO ) 49-51 MG Take 1 tablet by mouth 2 (two) times daily.    tirzepatide  (MOUNJARO ) 10 MG/0.5ML Pen Inject 10 mg into the skin once a week.    torsemide  (DEMADEX ) 20 MG tablet Take 1 tablet (20 mg total) by mouth daily as needed (swelling/fluid/weight gain). 05/08/2023: Pt takes as needed.    triamcinolone  cream (KENALOG ) 0.1 % Apply topically 2 (two) times daily.    vitamin C (ASCORBIC ACID) 500 MG tablet Take 500 mg by mouth daily.    No facility-administered encounter medications on file as of 02/02/2024.     Follow-Up   No follow-ups on file.Beth Macias She was informed of the importance of frequent follow up visits to maximize her success with intensive lifestyle modifications for her multiple health conditions.  Attestation Statement   Reviewed by clinician on day of visit: allergies, medications, problem list, medical history, surgical history, family history, social history, and previous encounter notes.     Lucas Parker, MD

## 2024-02-03 NOTE — Assessment & Plan Note (Signed)
 Significant weight loss of 36 pounds since April 2024, equating to 15.6% of body weight. Current weight is 195 pounds, with a goal to reach 140-150 pounds. Plans to join the [facility] for pool exercises. Discussed the benefits of exercise on overall health, including muscle function, hormone production, and cognitive benefits. - Continue Mounjaro  10 mg - Encourage joining [employer] for pool exercises

## 2024-02-03 NOTE — Assessment & Plan Note (Signed)
 She did not tolerate rosuvastatin  initially she is now on atorvastatin  and denies any adverse effects we will continue medication check disease monitoring labs in about 3 months.

## 2024-02-03 NOTE — Assessment & Plan Note (Signed)
 Her A1c has improved significantly from 7.2 in 2021 to 5.8, indicating good control likely due to weight loss and dietary changes and treatment with GLP-1.  She denies any signs or symptoms of hypoglycemia. -Continue Mounjaro  10 mg once a week - Continue current diabetes management plan.  Continue to work on blood pressure control

## 2024-02-03 NOTE — Assessment & Plan Note (Signed)
 Reviewed echocardiogram from 2022.  She has LVH with grade 1 diastolic dysfunction.  She has an elevated BNP.  She is followed by cardiology and is currently on Entresto , carvedilol , amlodipine  and as needed use of torsemide .  She lacks self-monitoring.  Her blood pressure is above target  Patient counseled on adherence, self-monitoring, maintain a diet low in sodium.   She is now on GLP-1 therapy which has been shown to be beneficial in heart failure with preserved ejection fraction.

## 2024-02-03 NOTE — Assessment & Plan Note (Signed)
 Her blood pressure today was elevated.  She was counseled on the risk associated with uncontrolled blood pressure as well as goals of care.  She denies problems with adherence or tolerance to her medications.  She reports being under significant stress starting counseling with a psychotherapist.  I recommend that she start monitoring her blood pressure at home if her blood pressure is above 130/80 she will reach out to her primary care team for adjustments of her antihypertensives.  Management strategy

## 2024-02-08 ENCOUNTER — Ambulatory Visit: Admitting: Podiatry

## 2024-02-24 DIAGNOSIS — E119 Type 2 diabetes mellitus without complications: Secondary | ICD-10-CM | POA: Diagnosis not present

## 2024-02-25 ENCOUNTER — Encounter (INDEPENDENT_AMBULATORY_CARE_PROVIDER_SITE_OTHER): Payer: Self-pay | Admitting: Internal Medicine

## 2024-02-25 ENCOUNTER — Ambulatory Visit (INDEPENDENT_AMBULATORY_CARE_PROVIDER_SITE_OTHER): Admitting: Internal Medicine

## 2024-02-25 VITALS — BP 168/94 | HR 75 | Temp 98.3°F | Ht 59.0 in | Wt 200.0 lb

## 2024-02-25 DIAGNOSIS — Z7985 Long-term (current) use of injectable non-insulin antidiabetic drugs: Secondary | ICD-10-CM

## 2024-02-25 DIAGNOSIS — E66813 Obesity, class 3: Secondary | ICD-10-CM | POA: Diagnosis not present

## 2024-02-25 DIAGNOSIS — E669 Obesity, unspecified: Secondary | ICD-10-CM

## 2024-02-25 DIAGNOSIS — E1159 Type 2 diabetes mellitus with other circulatory complications: Secondary | ICD-10-CM | POA: Diagnosis not present

## 2024-02-25 DIAGNOSIS — Z6841 Body Mass Index (BMI) 40.0 and over, adult: Secondary | ICD-10-CM

## 2024-02-25 DIAGNOSIS — Z723 Lack of physical exercise: Secondary | ICD-10-CM

## 2024-02-25 DIAGNOSIS — I152 Hypertension secondary to endocrine disorders: Secondary | ICD-10-CM | POA: Diagnosis not present

## 2024-02-25 DIAGNOSIS — F439 Reaction to severe stress, unspecified: Secondary | ICD-10-CM

## 2024-02-25 NOTE — Assessment & Plan Note (Signed)
 Weight: decrease of 31 lb (13.4%) over 1 year, 5 months  Start: 09/22/2022 231 lb (104.8 kg)  End: 02/25/2024 200 lb (90.7 kg)  After initial weight loss she has had recent weight regain due to stress, the motivation.  Counseling and support was provided today she will work on implementing principles of her nutrition plan and increase physical activity levels.

## 2024-02-25 NOTE — Assessment & Plan Note (Signed)
 HgbA1c is at goal for age and comorbid conditions. Denies symptoms of hypoglycemia or hyperglycemia. On Mounjaro  with good adherence and no side effects.   Counseled on goals of care, monitoring for complications and importance of staying updated on immunizations and diabetes preventive measures. Continue with reduced calorie meal plan low on processed crabs and simple sugars. Ongoing weight loss will improve insulin  resistance and glycemic control  Lab Results  Component Value Date   HGBA1C 5.8 11/24/2023   HGBA1C 5.7 08/03/2023   HGBA1C 6.8 (H) 03/17/2023   Lab Results  Component Value Date   MICROALBUR 6.3 (H) 08/03/2023   LDLCALC 110 (H) 08/03/2023   CREATININE 0.87 08/03/2023   Continue Mounjaro  and reimplementation of nutritional strategies.

## 2024-02-25 NOTE — Assessment & Plan Note (Signed)
 Blood pressure is markedly elevated and uncontrolled she denies problems with adherence or side effects of blood pressure medications.  She is under significant stress due to the news and societal issues.  Support and counseling was provided.  She is on carvedilol  torsemide  and Entresto .  She is followed by cardiology and also primary care.  I have recommended that she forward her blood pressures to managing physicians for medication adjustments.  She will work with her therapist to help with stress.

## 2024-02-25 NOTE — Assessment & Plan Note (Signed)
 She will be enrolling in the local [employer] to work on increasing volume of physical activity this may also help her with stress.

## 2024-02-25 NOTE — Progress Notes (Signed)
 Office: [phone]  /  Fax: [phone]  Weight Summary and Body Composition Analysis (BIA)  Vitals Temp: 98.3 F (36.8 C) BP: (!) 168/94 Pulse Rate: 75 SpO2: 98 %   Anthropometric Measurements Height: 4' 11 (1.499 m) Weight: 200 lb (90.7 kg) BMI (Calculated): 40.37 Weight at Last Visit: 195 lb Weight Lost Since Last Visit: 0 lb Weight Gained Since Last Visit: 5 lb Starting Weight: 220 lb Total Weight Loss (lbs): 20 lb (9.072 kg) Peak Weight: 230 lb   Body Composition  Body Fat %: 54.3 % Fat Mass (lbs): 108.6 lbs Muscle Mass (lbs): 91.4 lbs Visceral Fat Rating : 19    RMR: 1774  Today's Visit #: 12  Starting Date: 03/17/23   Subjective   Chief Complaint: Obesity  Interval History Discussed the use of AI scribe software for clinical note transcription with the patient, who gave verbal consent to proceed.  History of Present Illness Beth Macias is a 73 year old female who presents for medical weight management.  Since her last office visit, she has gained five pounds despite adhering to a 1200 calorie diet plan. She has not been exercising consistently, managing to exercise only 50% of the time.  She experiences feelings of depression and stress due to various personal and societal issues, which she believes have impacted her motivation and mental state. She describes her mind as 'worn out' and has lost motivation, affecting her weight loss efforts.  She is on Mounjaro  and denies any adverse effects.  She has a history of hypertension and has been on blood pressure medication since 1982, following the birth of her second child. She currently takes carvedilol  twice daily, along with other medications, primarily in the morning and evening. Her blood pressure has been elevated recently, which she attributes to stress from current events and personal challenges.  She has a therapist and plans to have a conversation with her soon, as she has not spoken  to a therapist in five years.     Challenges affecting patient progress: low volume of physical activity at present , medical comorbidities, moderate to high levels of stress, menopause, and all-or- none mindset.    Pharmacotherapy for weight management: She is currently taking Monjauro with diabetes as the primary indication and obesity secondary with adequate clinical response  and without side effects..   Assessment and Plan   Treatment Plan For Obesity:  Recommended Dietary Goals  Beth Macias is currently in the action stage of change. As such, her goal is to continue weight management plan. She has agreed to: continue current plan and continue to work on implementation of reduced calorie nutrition plan ([facility]NP)  Behavioral Health and Counseling  We discussed the following behavioral modification strategies today: continue to work on maintaining a reduced calorie state, getting the recommended amount of protein, incorporating whole foods, making healthy choices, staying well hydrated and practicing mindfulness when eating., increase protein intake, fibrous foods (25 grams per day for women, 30 grams for men) and water to improve satiety and decrease hunger signals. , getting back on track after recent relapse, avoid all or none thinking, display self-compassion, and rethink your why .  Additional education and resources provided today: None  Recommended Physical Activity Goals  Beth Macias has been advised to work up to 150 minutes of moderate intensity aerobic activity a week and strengthening exercises 2-3 times per week for cardiovascular health, weight loss maintenance and preservation of muscle mass.  She has agreed to :  Think about  enjoyable ways to increase daily physical activity and overcoming barriers to exercise and Increase physical activity in their day and reduce sedentary time (increase NEAT).  Medical Interventions and Pharmacotherapy  We discussed various medication  options to help Beth Macias with her weight loss efforts and we both agreed to : Adequate clinical response to anti-obesity medication, continue current regimen  Associated Conditions Impacted by Obesity Treatment  Assessment & Plan Stress Experiencing significant stress and depression due to personal and societal issues. Reports feeling overwhelmed and loss of motivation. Engaged with a therapist for mental health support. - Continue therapy sessions. - Encourage physical activity to manage stress. Hypertension associated with type 2 diabetes mellitus (HCC) Blood pressure is markedly elevated and uncontrolled she denies problems with adherence or side effects of blood pressure medications.  She is under significant stress due to the news and societal issues.  Support and counseling was provided.  She is on carvedilol  torsemide  and Entresto .  She is followed by cardiology and also primary care.  I have recommended that she forward her blood pressures to managing physicians for medication adjustments.  She will work with her therapist to help with stress. Type 2 diabetes mellitus in patient with obesity (HCC) HgbA1c is at goal for age and comorbid conditions. Denies symptoms of hypoglycemia or hyperglycemia. On Mounjaro  with good adherence and no side effects.   Counseled on goals of care, monitoring for complications and importance of staying updated on immunizations and diabetes preventive measures. Continue with reduced calorie meal plan low on processed crabs and simple sugars. Ongoing weight loss will improve insulin  resistance and glycemic control  Lab Results  Component Value Date   HGBA1C 5.8 11/24/2023   HGBA1C 5.7 08/03/2023   HGBA1C 6.8 (H) 03/17/2023   Lab Results  Component Value Date   MICROALBUR 6.3 (H) 08/03/2023   LDLCALC 110 (H) 08/03/2023   CREATININE 0.87 08/03/2023   Continue Mounjaro  and reimplementation of nutritional strategies.  Class 3 severe obesity with serious  comorbidity and body mass index (BMI) of 40.0 to 44.9 in adult, unspecified obesity type (HCC) Weight: decrease of 31 lb (13.4%) over 1 year, 5 months  Start: 09/22/2022 231 lb (104.8 kg)  End: 02/25/2024 200 lb (90.7 kg)  After initial weight loss she has had recent weight regain due to stress, the motivation.  Counseling and support was provided today she will work on implementing principles of her nutrition plan and increase physical activity levels. Physically inactive She will be enrolling in the local [employer] to work on increasing volume of physical activity this may also help her with stress.    Assessment and Plan      Objective   Physical Exam:  Blood pressure (!) 168/94, pulse 75, temperature 98.3 F (36.8 C), height 4' 11 (1.499 m), weight 200 lb (90.7 kg), SpO2 98%. Body mass index is 40.4 kg/m.  General: She is overweight, cooperative, alert, well developed, and in no acute distress. PSYCH: Has normal mood, affect and thought process.   HEENT: EOMI, sclerae are anicteric. Lungs: Normal breathing effort, no conversational dyspnea. Extremities: No edema.  Neurologic: No gross sensory or motor deficits. No tremors or fasciculations noted.    Diagnostic Data Reviewed:  BMET    Component Value Date/Time   NA 142 08/03/2023 1006   NA 145 (H) 03/04/2023 1102   K 3.3 (L) 08/03/2023 1006   CL 105 08/03/2023 1006   CO2 28 08/03/2023 1006   GLUCOSE 114 (H) 08/03/2023 1006   BUN  23 08/03/2023 1006   BUN 15 03/04/2023 1102   CREATININE 0.87 08/03/2023 1006   CALCIUM  10.2 08/03/2023 1006   GFRNONAA 60 (L) 11/06/2020 1114   GFRAA 86 02/29/2020 1010   Lab Results  Component Value Date   HGBA1C 5.8 11/24/2023   HGBA1C 7.2 (H) 06/29/2019   Lab Results  Component Value Date   INSULIN  19.7 03/17/2023   Lab Results  Component Value Date   TSH 0.47 08/03/2023   CBC    Component Value Date/Time   WBC 5.4 11/24/2023 0813   RBC 4.55 11/24/2023 0813   HGB 12.6  11/24/2023 0813   HCT 38.6 11/24/2023 0813   HCT 38.2 03/04/2023 1102   PLT 278.0 11/24/2023 0813   MCV 84.9 11/24/2023 0813   MCH 28.0 10/29/2020 0021   MCHC 32.6 11/24/2023 0813   RDW 13.9 11/24/2023 0813   Iron Studies No results found for: IRON, TIBC, FERRITIN, IRONPCTSAT Lipid Panel     Component Value Date/Time   CHOL 184 08/03/2023 1006   CHOL 243 (H) 03/17/2023 1055   TRIG 103.0 08/03/2023 1006   HDL 52.70 08/03/2023 1006   HDL 68 03/17/2023 1055   CHOLHDL 3 08/03/2023 1006   VLDL 20.6 08/03/2023 1006   LDLCALC 110 (H) 08/03/2023 1006   LDLCALC 149 (H) 03/17/2023 1055   Hepatic Function Panel     Component Value Date/Time   PROT 6.6 08/03/2023 1006   PROT 6.6 04/20/2023 1136   ALBUMIN 4.1 08/03/2023 1006   ALBUMIN 3.9 02/29/2020 1010   AST 15 08/03/2023 1006   ALT 8 08/03/2023 1006   ALKPHOS 65 08/03/2023 1006   BILITOT 0.6 08/03/2023 1006   BILITOT 0.7 02/29/2020 1010      Component Value Date/Time   TSH 0.47 08/03/2023 1006   Nutritional Lab Results  Component Value Date   VD25OH 58.4 03/17/2023    Medications: Outpatient Encounter Medications as of 02/25/2024  Medication Sig Note   albuterol  (VENTOLIN  HFA) 108 (90 Base) MCG/ACT inhaler Inhale 2 puffs into the lungs every 4 (four) hours as needed for wheezing or shortness of breath. 05/08/2023: Pt takes as needed.   allopurinol  (ZYLOPRIM ) 100 MG tablet Take 1 tablet (100 mg total) by mouth daily.    Apoaequorin (PREVAGEN) 10 MG CAPS Take by mouth.    atorvastatin  (LIPITOR) 20 MG tablet Take 1 tablet (20 mg total) by mouth daily.    B Complex-Minerals (ELDERTONIC PO) Take 15 mLs by mouth daily. Geritol    blood glucose meter kit and supplies KIT Dispense based on patient and insurance preference. Use up to four times daily as directed.    Blood Pressure Monitoring (BLOOD PRESSURE CUFF) MISC For daily monitoring as directed.    Calcium -Magnesium -Vitamin D  (CALCIUM  1200+D3 PO) Take 600 mg by  mouth daily.    carvedilol  (COREG ) 25 MG tablet Take 1 tablet (25 mg total) by mouth 2 (two) times daily.    cholecalciferol (VITAMIN D ) 25 MCG (1000 UNIT) tablet Take 1,000 Units by mouth at bedtime.     Cyanocobalamin  ([employer] B12 GUMMIES PO) Take 1,000 mcg by mouth daily.    doxycycline  (VIBRAMYCIN ) 100 MG capsule Take 1 capsule (100 mg total) by mouth 2 (two) times daily.    famotidine  (PEPCID ) 40 MG tablet Take 1 tablet (40 mg total) by mouth in the morning.    fluticasone  (FLONASE ) 50 MCG/ACT nasal spray Place 1 spray into both nostrils daily. 05/08/2023: Pt takes as needed.   ipratropium-albuterol  (DUONEB) 0.5-2.5 (3)  MG/3ML SOLN Take 3 mLs by nebulization every 6 (six) hours as needed (shortness of breath/wheezing).    levocetirizine (XYZAL ) 5 MG tablet Take 1 tablet (5 mg total) by mouth every evening.    montelukast  (SINGULAIR ) 10 MG tablet Take 1 tablet (10 mg total) by mouth daily.    ONETOUCH ULTRA test strip USE TO TEST BLOOD SUGAR UP TO FOUR TIMES DAILY    potassium chloride  20 MEQ/15ML (10%) SOLN TAKE 15 MLS BY MOUTH DAILY AS NEEDED(WITH EACH DOSE OF TORSEMIDE )    sacubitril -valsartan  (ENTRESTO ) 49-51 MG Take 1 tablet by mouth 2 (two) times daily.    tirzepatide  (MOUNJARO ) 10 MG/0.5ML Pen Inject 10 mg into the skin once a week.    torsemide  (DEMADEX ) 20 MG tablet Take 1 tablet (20 mg total) by mouth daily as needed (swelling/fluid/weight gain). 05/08/2023: Pt takes as needed.   triamcinolone  cream (KENALOG ) 0.1 % Apply topically 2 (two) times daily.    vitamin C (ASCORBIC ACID) 500 MG tablet Take 500 mg by mouth daily.    No facility-administered encounter medications on file as of 02/25/2024.     Follow-Up   Return in about 4 weeks (around 03/24/2024) for For Weight Mangement with Dr. Francyne.SABRA She was informed of the importance of frequent follow up visits to maximize her success with intensive lifestyle modifications for her multiple health conditions.  Attestation Statement    Reviewed by clinician on day of visit: allergies, medications, problem list, medical history, surgical history, family history, social history, and previous encounter notes.     Lucas Francyne, MD

## 2024-02-29 ENCOUNTER — Ambulatory Visit (INDEPENDENT_AMBULATORY_CARE_PROVIDER_SITE_OTHER): Admitting: Podiatrist

## 2024-02-29 DIAGNOSIS — M79676 Pain in unspecified toe(s): Secondary | ICD-10-CM

## 2024-02-29 DIAGNOSIS — B351 Tinea unguium: Secondary | ICD-10-CM

## 2024-02-29 MED ORDER — CICLOPIROX 8 % EX SOLN: Freq: Every day | CUTANEOUS | 5 refills | Status: DC

## 2024-03-02 NOTE — Progress Notes (Signed)
 Subjective: Beth Macias is a 73 y.o. female patient with history of diabetes who presents to office today with a chief concern of long,mildly painful nails  while ambulating in shoes; unable to trim.  Patient denies any new cramping, numbness, burning or tingling in the legs. Patient denies any new changes in medication or new problems.  Referring Provider: Mercer Clotilda SAUNDERS, MD   Patient Active Problem List   Diagnosis Date Noted   BMI 39.0-39.9,adult 02/02/2024   Grief 10/15/2023   Class 3 severe obesity with serious comorbidity and body mass index (BMI) of 40.0 to 44.9 in adult [facility]) 09/07/2023   Physically inactive 07/07/2023   Other specified counseling 04/17/2023   At high risk for cardiovascular disease 03/31/2023   Statin medication declined by patient 03/31/2023   Generalized obesity starting BMI  44.5 03/17/2023   Suspected sleep apnea 03/17/2023   Chronic heart failure with preserved ejection fraction (HCC) 01/12/2023   Hypertension associated with type 2 diabetes mellitus (HCC) 01/12/2023   Hyperlipidemia associated with type 2 diabetes mellitus (HCC) 01/12/2023   Acute on chronic heart failure with preserved ejection fraction (HFpEF) (HCC) 10/27/2020   Hypertensive urgency 10/27/2020   Type 2 diabetes mellitus in patient with obesity (HCC) 10/27/2020   CHF (congestive heart failure) (HCC) 12/10/2018   Mild intermittent asthma without complication 06/30/2018   Environmental and seasonal allergies 06/30/2018   Diabetes (HCC) 04/04/2018   Morbid obesity with BMI of 40.0-44.9, adult (HCC) 02/18/2016   Extrinsic asthma without complication 06/19/2015   Cubital tunnel syndrome, right 07/06/2014   Hypokalemia 08/16/2013   Current Outpatient Medications on File Prior to Visit  Medication Sig Dispense Refill   HYDROcodone -acetaminophen  (NORCO/VICODIN) 5-325 MG tablet Take 1 tablet by mouth every 6 (six) hours as needed.     albuterol  (VENTOLIN  HFA) 108 (90 Base) MCG/ACT  inhaler Inhale 2 puffs into the lungs every 4 (four) hours as needed for wheezing or shortness of breath. 8.5 each 5   allopurinol  (ZYLOPRIM ) 100 MG tablet Take 1 tablet (100 mg total) by mouth daily. 30 tablet 6   amlodipine -olmesartan (AZOR) 10-20 MG tablet 1 tablet Orally Once a day; Duration: 30 day(s)     Apoaequorin (PREVAGEN) 10 MG CAPS Take by mouth.     atorvastatin  (LIPITOR) 20 MG tablet Take 1 tablet (20 mg total) by mouth daily. 90 tablet 0   B Complex-Minerals (ELDERTONIC PO) Take 15 mLs by mouth daily. Geritol     blood glucose meter kit and supplies KIT Dispense based on patient and insurance preference. Use up to four times daily as directed. 1 each 0   Blood Pressure Monitoring (BLOOD PRESSURE CUFF) MISC For daily monitoring as directed. 1 each 0   Calcium -Magnesium -Vitamin D  (CALCIUM  1200+D3 PO) Take 600 mg by mouth daily.     carvedilol  (COREG ) 25 MG tablet Take 1 tablet (25 mg total) by mouth 2 (two) times daily. 180 tablet 3   cholecalciferol (VITAMIN D ) 25 MCG (1000 UNIT) tablet Take 1,000 Units by mouth at bedtime.      Cyanocobalamin  (CVS B12 GUMMIES PO) Take 1,000 mcg by mouth daily.     cyclobenzaprine (FLEXERIL) 10 MG tablet      doxycycline  (VIBRAMYCIN ) 100 MG capsule Take 1 capsule (100 mg total) by mouth 2 (two) times daily. 14 capsule 0   EPINEPHrine (EPIPEN 2-PAK) 0.3 mg/0.3 mL IJ SOAJ injection INJECT 0.3 MLS INTO THE MUSCLE PRN FOR ANAPHYLAXIS     famotidine  (PEPCID ) 40 MG tablet Take 1  tablet (40 mg total) by mouth in the morning. 90 tablet 3   fluticasone  (FLONASE ) 50 MCG/ACT nasal spray Place 1 spray into both nostrils daily. 16 g 6   ibuprofen (ADVIL) 600 MG tablet TK 1 T PO BID WITH FOOD PRN P     indomethacin (INDOCIN) 25 MG capsule 1 capsule with food Orally Twice a day; Duration: 30 day(s)     ipratropium-albuterol  (DUONEB) 0.5-2.5 (3) MG/3ML SOLN Take 3 mLs by nebulization every 6 (six) hours as needed (shortness of breath/wheezing).     levocetirizine  (XYZAL ) 5 MG tablet Take 1 tablet (5 mg total) by mouth every evening. 90 tablet 1   metaxalone (SKELAXIN) 800 MG tablet TK 1 T PO TID PRN     metFORMIN (GLUCOPHAGE-XR) 500 MG 24 hr tablet      metoprolol tartrate (LOPRESSOR) 25 MG tablet TK 1 T PO D     montelukast  (SINGULAIR ) 10 MG tablet Take 1 tablet (10 mg total) by mouth daily. 90 tablet 3   omeprazole  (PRILOSEC) 20 MG capsule 1 capsule.     ONETOUCH ULTRA test strip USE TO TEST BLOOD SUGAR UP TO FOUR TIMES DAILY 100 strip 3   potassium chloride  20 MEQ/15ML (10%) SOLN TAKE 15 MLS BY MOUTH DAILY AS NEEDED(WITH EACH DOSE OF TORSEMIDE ) 1373 mL 3   predniSONE (DELTASONE) 5 MG tablet TK 8 TS PO ON DAY 1 TK 6 TS D ON DAYS 2 AND 3 TK 4 TS D ON DAYS 4 AND 5 AND 2 TS D ON DAYS 6 AND 7     sacubitril -valsartan  (ENTRESTO ) 49-51 MG Take 1 tablet by mouth 2 (two) times daily. 180 tablet 3   tirzepatide  (MOUNJARO ) 10 MG/0.5ML Pen Inject 10 mg into the skin once a week. 2 mL 1   torsemide  (DEMADEX ) 20 MG tablet Take 1 tablet (20 mg total) by mouth daily as needed (swelling/fluid/weight gain). 90 tablet 1   traMADol (ULTRAM) 50 MG tablet      triamcinolone  cream (KENALOG ) 0.1 % Apply topically 2 (two) times daily.     triamterene-hydrochlorothiazide (MAXZIDE-25) 37.5-25 MG tablet 1 tablet in the morning Orally Once a day; Duration: 30 day(s)     vitamin C (ASCORBIC ACID) 500 MG tablet Take 500 mg by mouth daily.     No current facility-administered medications on file prior to visit.   Allergies  Allergen Reactions   Strawberry (Diagnostic) Anaphylaxis and Other (See Comments)    hives   Farxiga  [Dapagliflozin ] Other (See Comments)    Had HAs, weakness, rhinorrhea, jitteriness, nasal congestion, nocturia, yeast infections, memory changes, irritability.   Penicillins Itching    Has patient had a PCN reaction causing immediate rash, facial/tongue/throat swelling, SOB or lightheadedness with hypotension: No Has patient had a PCN reaction causing severe  rash involving mucus membranes or skin necrosis: No Has patient had a PCN reaction that required hospitalization: Unknown Has patient had a PCN reaction occurring within the last 10 years: No If all of the above answers are NO, then may proceed with Cephalosporin use.   Amoxicillin Other (See Comments)   Rosuvastatin  Other (See Comments)    Bloating, flatulence   Gabapentin Itching, Rash and Other (See Comments)    Incoherent   Tape Other (See Comments)    Blisters      Objective: General: Patient is awake, alert, and oriented x 3 and in no acute distress.  Integument: Skin is warm, dry and supple bilateral. Nails are tender, long, thickened and  dystrophic with subungual debris, consistent with onychomycosis, 1-5 bilateral. No signs of infection. No open lesions or preulcerative lesions present bilateral. Right second toenail continues to be dark in color-and thick likely age related/ microtrauma/ nail fungus  Vasculature:  Dorsalis Pedis pulse 2/4 bilateral. Posterior Tibial pulse  2/4 bilateral.  Capillary fill time <3 sec 1-5 bilateral. Temperature gradient within normal limits. no varicosities present bilateral. no edema present bilateral.   Neurology: The patient has decreased sensation measured with a 5.07/10g Semmes Weinstein Monofilament at  5/5 pedal sites bilateral . Vibratory sensation diminished bilateral with tuning fork. No Babinski sign present bilateral.   Musculoskeletal: No symptomatic pedal deformities noted bilateral. Muscular strength 5/5 in all lower extremity muscular groups bilateral without pain on range of motion . No tenderness with calf compression bilateral.  Assessment and Plan:   ICD-10-CM   1. Pain due to onychomycosis of toenail  B35.1    M79.676        -Examined patient. -Mechanically debrided all nails 1-5 bilateral using sterile nail nipper and filed with dremel without incident  - rx for penlac written and instructions for use given.   -Answered all patient questions -Patient to return  in 3 months for at risk foot care -Patient advised to call the office if any problems or questions arise in the meantime.  Lamarr SHAUNNA Salen, DPM

## 2024-03-03 ENCOUNTER — Telehealth: Payer: Self-pay | Admitting: Cardiovascular Disease

## 2024-03-03 ENCOUNTER — Ambulatory Visit: Attending: Cardiology | Admitting: Cardiology

## 2024-03-03 ENCOUNTER — Encounter: Payer: Self-pay | Admitting: Cardiology

## 2024-03-03 VITALS — BP 172/92 | HR 80 | Resp 16 | Ht 59.0 in | Wt 202.0 lb

## 2024-03-03 DIAGNOSIS — I517 Cardiomegaly: Secondary | ICD-10-CM | POA: Diagnosis not present

## 2024-03-03 DIAGNOSIS — I10 Essential (primary) hypertension: Secondary | ICD-10-CM

## 2024-03-03 DIAGNOSIS — I5022 Chronic systolic (congestive) heart failure: Secondary | ICD-10-CM | POA: Diagnosis not present

## 2024-03-03 DIAGNOSIS — Z79899 Other long term (current) drug therapy: Secondary | ICD-10-CM

## 2024-03-03 DIAGNOSIS — I3139 Other pericardial effusion (noninflammatory): Secondary | ICD-10-CM

## 2024-03-03 MED ORDER — AMLODIPINE BESYLATE 5 MG PO TABS: 5.0000 mg | ORAL_TABLET | Freq: Every day | ORAL | 3 refills | Status: AC

## 2024-03-03 NOTE — Progress Notes (Signed)
 Cardiology Office Note    Date:  03/05/2024  ID:  SHATON LORE, DOB 07/11/1950, MRN [id number] PCP:  Mercer Clotilda SAUNDERS, MD  Cardiologist:  Maude Emmer, MD  Electrophysiologist:  None   Chief Complaint: Hypertension   History of Present Illness: .    EVONE ARSENEAU is a 73 y.o. female with visit-pertinent history of heart failure with reduced EF, nonischemic dilated cardiomyopathy with improved EF, pericardial effusion, hypertension, hyperlipidemia, type 2 diabetes, fibromyalgia and gout.  Echocardiogram in June 2020 indicated EF 30 to 35%, moderate MR, small to moderate size pericardial effusion.  Myoview  in July 2020 showed normal perfusion, LVEF 32%.  She was started on GDMT.  Echo in 2021 showed EF improved to 50 to 55%, moderate pericardial effusion with no evidence of tamponade, trivial MR.  Echo in 02/2021 showed EF 6065%, moderate LVH with speckling.  Given concern for amyloid pattern on prior echo she underwent PYP scan which revealed positive uptake.  Follow-up cardiac MRI showed moderate LVH, EF 51%, no late gadolinium uptake, not consistent with amyloid.  There was evidence of a large circumferential pericardial effusion with no signs of tamponade.  Echo in 03/2023 indicated EF 50 to 55%, low normal LV function, possible basal inferior hypokinesis, moderate concentric LVH, G1 DD, normal RV systolic function, moderate pericardial effusion with no evidence of tamponade.  Repeat echo was recommended in 1 year.  Patient was last seen in clinic on 07/14/2023, she had remained stable from a cardiac standpoint.  Today patient presents as an acute visit for hypertension.  She reports that she has had elevated blood pressures at home for the last 2 weeks with systolic blood pressures in the 190s.  She denies any significant headache, vision changes, dizziness, lightheadedness, chest pain or shortness of breath.  She denies any increased lower extremity edema, orthopnea or PND.  Patient  reports that she does not use salt however on further discussion reports that she has been using sea salt and has been taking ibuprofen intermittently for increased arthritis pain.  There is some noted confusion regarding what medication she is currently taking, she is able to confirm that she is taking Entresto  and carvedilol , on calling her pharmacy there are a few discrepancies, confirmed that she has received Entresto  and carvedilol  but also received a prescription for Nebivolol  which is no longer on her medication list.  Patient reports she is also no longer on amlodipine , was not familiar with the medication.  Patient notes concerns as she feels she has had multiple doctors addressing her medications and is unsure as to what medication she is truly taking. ROS: .   Today she denies chest pain, shortness of breath, lower extremity edema, fatigue, palpitations, melena, hematuria, hemoptysis, diaphoresis, weakness, presyncope, syncope, orthopnea, and PND.  All other systems are reviewed and otherwise negative. Studies Reviewed: SABRA   EKG:  EKG is ordered today, personally reviewed, demonstrating  EKG Interpretation Date/Time:  Thursday March 03 2024 15:00:59 EDT Ventricular Rate:  78 PR Interval:  198 QRS Duration:  96 QT Interval:  366 QTC Calculation: 417 R Axis:   -51  Text Interpretation: Normal sinus rhythm Left axis deviation Moderate voltage criteria for LVH, may be normal variant Anterolateral infarct , age undetermined Confirmed by Eithen Castiglia [phone]) on 03/04/2024 2:22:01 PM   CV Studies: Cardiac studies reviewed are outlined and summarized above. Otherwise please see EMR for full report. Cardiac Studies & Procedures   ______________________________________________________________________________________________   STRESS TESTS  MYOCARDIAL PERFUSION  IMAGING 12/13/2018  Interpretation Summary  Nuclear stress EF: 32%. The left ventricular ejection fraction is moderately decreased  (30-44%).  Blood pressure demonstrated a normal response to exercise.  This is a high risk study based on the severe LV dysfunction.  There is no evidence of ischemia and no evidence of previous infarction   ECHOCARDIOGRAM  ECHOCARDIOGRAM COMPLETE 04/16/2023  Narrative ECHOCARDIOGRAM REPORT    Patient Name:   DANAKA LLERA Date of Exam: 04/16/2023 Medical Rec #:  [id number]          Height:       59.0 in Accession #:    [id number]         Weight:       216.0 lb Date of Birth:  [birth year]          BSA:          1.906 m Patient Age:    72 years           BP:           155/95 mmHg Patient Gender: F                  HR:           58 bpm. Exam Location:  [address]  Procedure: 2D Echo, Color Doppler, Cardiac Doppler, 3D Echo and Strain Analysis  Indications:    Pericardial Effusion I31.3  History:        Patient has prior history of Echocardiogram examinations, most recent 02/25/2021. CHF; Risk Factors:Diabetes and Hypertension.  Sonographer:    Augustin Seals RDCS Referring Phys: [id number] MAUDE JAYSON EMMER  IMPRESSIONS   1. Left ventricular ejection fraction, by estimation, is 50 to 55%. The left ventricle has low normal function. The left ventricle demonstrates regional wall motion abnormalities with possible basal inferior hypokinesis. There is moderate concentric left ventricular hypertrophy. Left ventricular diastolic parameters are consistent with Grade I diastolic dysfunction (impaired relaxation). The average left ventricular global longitudinal strain is -16.8 %. The global longitudinal strain is abnormal. 2. Right ventricular systolic function is normal. The right ventricular size is normal. Tricuspid regurgitation signal is inadequate for assessing PA pressure. 3. The mitral valve is normal in structure. No evidence of mitral valve regurgitation. No evidence of mitral stenosis. 4. The aortic valve is tricuspid. There is mild calcification of the aortic valve. Aortic  valve regurgitation is not visualized. No aortic stenosis is present. 5. The inferior vena cava is normal in size with greater than 50% respiratory variability, suggesting right atrial pressure of 3 mmHg. 6. No evidence for tamponade. No RV diastolic collapse, <25% respirophasic variation of the mitral E inflow velocity, IVC not dilated. Moderate pericardial effusion. The pericardial effusion is circumferential.  FINDINGS Left Ventricle: Left ventricular ejection fraction, by estimation, is 50 to 55%. The left ventricle has low normal function. The left ventricle demonstrates regional wall motion abnormalities. The average left ventricular global longitudinal strain is -16.8 %. The global longitudinal strain is abnormal. The left ventricular internal cavity size was normal in size. There is moderate concentric left ventricular hypertrophy. Left ventricular diastolic parameters are consistent with Grade I diastolic dysfunction (impaired relaxation).  Right Ventricle: The right ventricular size is normal. No increase in right ventricular wall thickness. Right ventricular systolic function is normal. Tricuspid regurgitation signal is inadequate for assessing PA pressure.  Left Atrium: Left atrial size was normal in size.  Right Atrium: Right atrial size was normal in size.  Pericardium: No evidence for tamponade.  No RV diastolic collapse, <25% respirophasic variation of the mitral E inflow velocity, IVC not dilated. A moderately sized pericardial effusion is present. The pericardial effusion is circumferential.  Mitral Valve: The mitral valve is normal in structure. No evidence of mitral valve regurgitation. No evidence of mitral valve stenosis.  Tricuspid Valve: The tricuspid valve is normal in structure. Tricuspid valve regurgitation is trivial.  Aortic Valve: The aortic valve is tricuspid. There is mild calcification of the aortic valve. Aortic valve regurgitation is not visualized. No aortic  stenosis is present.  Pulmonic Valve: The pulmonic valve was normal in structure. Pulmonic valve regurgitation is not visualized.  Aorta: The aortic root is normal in size and structure.  Venous: The inferior vena cava is normal in size with greater than 50% respiratory variability, suggesting right atrial pressure of 3 mmHg.  IAS/Shunts: No atrial level shunt detected by color flow Doppler.   LEFT VENTRICLE PLAX 2D LVIDd:         4.30 cm LVIDs:         2.90 cm   2D Longitudinal Strain LV PW:         1.60 cm   2D Strain GLS Avg:     -16.8 % LV IVS:        1.60 cm LVOT diam:     2.20 cm LV SV:         67 LV SV Index:   35        3D Volume EF: LVOT Area:     3.80 cm  3D EF:        54 % LV EDV:       148 ml LV ESV:       67 ml LV SV:        80 ml  RIGHT VENTRICLE             IVC RV Basal diam:  3.00 cm     IVC diam: 1.70 cm RV Mid diam:    3.00 cm RV S prime:     10.10 cm/s  LEFT ATRIUM             Index        RIGHT ATRIUM           Index LA diam:        3.70 cm 1.94 cm/m   RA Area:     14.40 cm LA Vol (A2C):   47.2 ml 24.77 ml/m  RA Volume:   31.80 ml  16.69 ml/m LA Vol (A4C):   50.1 ml 26.29 ml/m LA Biplane Vol: 49.2 ml 25.82 ml/m AORTIC VALVE LVOT Vmax:   84.20 cm/s LVOT Vmean:  50.400 cm/s LVOT VTI:    0.175 m  AORTA Ao Root diam: 2.90 cm Ao Asc diam:  3.40 cm  MITRAL VALVE MV Area (PHT): 3.08 cm    SHUNTS MV Decel Time: 246 msec    Systemic VTI:  0.18 m MV E velocity: 56.90 cm/s  Systemic Diam: 2.20 cm MV A velocity: 62.50 cm/s MV E/A ratio:  0.91  Dalton McleanMD Electronically signed by Ezra Kanner Signature Date/Time: 04/16/2023/1:04:16 PM    Final        CARDIAC MRI  MR CARDIAC MORPHOLOGY W WO CONTRAST 03/11/2023  Narrative CLINICAL DATA:  R/O Amyloid  EXAM: CARDIAC MRI  TECHNIQUE: The patient was scanned on a 1.5 Tesla Siemens magnet. A dedicated cardiac coil was used. Functional imaging was done using Fiesta sequences.  2,3, and 4  chamber views were done to assess for RWMA's. Modified Simpson's rule using a short axis stack was used to calculate an ejection fraction on a dedicated work [employer]. The patient received 10 cc of Gadavist . After 10 minutes inversion recovery sequences were used to assess for infiltration and scar tissue.  CONTRAST:  10mL GADAVIST  GADOBUTROL  1 MMOL/ML IV SOLN  FINDINGS: Mild LAE normal RA No LAA thrombus. No ASD/PFO. Normal ascending thoracic aorta 3.4 cm. Tri leaflet AV with no AR/AS  Mildly thickened MV with trivial appearing MR. Large circumferential pericardial effusion with no evidence of tamponade on free breathing sequences.  There is moderate LVH with septal thickness 14 mm. No RWMA;s and low normal systolic LV function. There is normal nulling of the myocardium prior to the LV cavity on post gadolinium images. There is no significant late gadolinium uptake to suggest amyloid or infiltrative cardiomyopathy.  Quantitative LVEF 51% (EDV 172 cc ESV 85 cc SV 88 cc) Estimated cardiac output 4.5 L/min  Quantitative RVEF 54% (EDV 116 cc ESV 53 cc SV 63 cc )  Parametric Measures: Using Hct of 38  T1: Upper normal 1055 msec  ECV: Minimally elevated 31%  T2: Normal 45 msec  IMPRESSION: 1.  Moderate LVH septal thickness 14 mm Low normal LVEF 51%  2.  Normal RVEF 54%  3.  Normal myocardial nulling with no late gadolinium uptake  4.  Estimated cardiac output 4.5 L/min  5. Normal T1/T2 with minimally elevated ECV not consistent with amyloid  6. Large circumferential pericardial effusion with no tamponade physiology noted on free breathing sequences  Maude Emmer   Electronically Signed By: Maude Emmer M.D. On: 03/11/2023 17:06  PYP SCAN  MYOCARDIAL AMYLOID PLANAR AND SPECT 01/22/2023  Interpretation Summary   Myocardial uptake was positive for radiotracer uptake. The visual grade of myocardial uptake relative to the ribs was  Grade 1 (Myocardial uptake less than rib uptake).   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   Prior study not available for comparison.   Recommend cardiac MRI for further evaluation  ______________________________________________________________________________________________       Current Reported Medications:.    Current Meds  Medication Sig   albuterol  (VENTOLIN  HFA) 108 (90 Base) MCG/ACT inhaler Inhale 2 puffs into the lungs every 4 (four) hours as needed for wheezing or shortness of breath.   allopurinol  (ZYLOPRIM ) 100 MG tablet Take 1 tablet (100 mg total) by mouth daily.   amLODipine  (NORVASC ) 5 MG tablet Take 1 tablet (5 mg total) by mouth daily.   Apoaequorin (PREVAGEN) 10 MG CAPS Take by mouth.   atorvastatin  (LIPITOR) 20 MG tablet Take 1 tablet (20 mg total) by mouth daily.   B Complex-Minerals (ELDERTONIC PO) Take 15 mLs by mouth daily. Geritol   blood glucose meter kit and supplies KIT Dispense based on patient and insurance preference. Use up to four times daily as directed.   Blood Pressure Monitoring (BLOOD PRESSURE CUFF) MISC For daily monitoring as directed.   Calcium -Magnesium -Vitamin D  (CALCIUM  1200+D3 PO) Take 600 mg by mouth daily.   carvedilol  (COREG ) 25 MG tablet Take 1 tablet (25 mg total) by mouth 2 (two) times daily.   cholecalciferol (VITAMIN D ) 25 MCG (1000 UNIT) tablet Take 1,000 Units by mouth at bedtime.    Cyanocobalamin  (CVS B12 GUMMIES PO) Take 1,000 mcg by mouth daily.   cyclobenzaprine (FLEXERIL) 10 MG tablet    famotidine  (PEPCID ) 40 MG tablet Take 1 tablet (40 mg total) by  mouth in the morning.   fluticasone  (FLONASE ) 50 MCG/ACT nasal spray Place 1 spray into both nostrils daily.   ibuprofen (ADVIL) 600 MG tablet TK 1 T PO BID WITH FOOD PRN P   ipratropium-albuterol  (DUONEB) 0.5-2.5 (3) MG/3ML SOLN Take 3 mLs by nebulization every 6 (six) hours as needed (shortness of breath/wheezing).   levocetirizine (XYZAL ) 5 MG tablet  Take 1 tablet (5 mg total) by mouth every evening.   metoprolol tartrate (LOPRESSOR) 25 MG tablet TK 1 T PO D   montelukast  (SINGULAIR ) 10 MG tablet Take 1 tablet (10 mg total) by mouth daily.   Nebivolol  HCl 20 MG TABS Take by mouth daily.   omeprazole  (PRILOSEC) 20 MG capsule 1 capsule.   ONETOUCH ULTRA test strip USE TO TEST BLOOD SUGAR UP TO FOUR TIMES DAILY   potassium chloride  20 MEQ/15ML (10%) SOLN TAKE 15 MLS BY MOUTH DAILY AS NEEDED(WITH EACH DOSE OF TORSEMIDE )   sacubitril -valsartan  (ENTRESTO ) 49-51 MG Take 1 tablet by mouth 2 (two) times daily.   tirzepatide  (MOUNJARO ) 10 MG/0.5ML Pen Inject 10 mg into the skin once a week.   torsemide  (DEMADEX ) 20 MG tablet Take 1 tablet (20 mg total) by mouth daily as needed (swelling/fluid/weight gain).   triamcinolone  cream (KENALOG ) 0.1 % Apply topically 2 (two) times daily.   vitamin C (ASCORBIC ACID) 500 MG tablet Take 500 mg by mouth daily.   Physical Exam:    VS:  BP (!) 172/92 (BP Location: Right Arm)   Pulse 80   Resp 16   Ht 4' 11 (1.499 m)   Wt 202 lb (91.6 kg)   SpO2 98%   BMI 40.80 kg/m    Wt Readings from Last 3 Encounters:  03/03/24 202 lb (91.6 kg)  02/25/24 200 lb (90.7 kg)  02/02/24 195 lb (88.5 kg)    GEN: Well nourished, well developed in no acute distress NECK: No JVD; No carotid bruits CARDIAC: RRR, no murmurs, rubs, gallops RESPIRATORY:  Clear to auscultation without rales, wheezing or rhonchi  ABDOMEN: Soft, non-tender, non-distended EXTREMITIES:  No edema; No acute deformity     Asessement and Plan:SABRA    Hypertension: Blood pressure today 170/94, on recheck 172/92.  Patient reports that she has been having significantly elevated blood pressures at home with some systolics in the 190s.  There is confusion regarding patient medications, she had amlodipine  with olmesartan listed on her med list as a combination pill however has not been taking the medication.  Patient does not believe that she has been taking  amlodipine  in recent months.  Patient's pharmacy was called, per reports has not been on amlodipine  in the last 6 months, has been adherent with carvedilol  but also has been refilling Nebivolol  however patient reports she has not been taking Nebivolol .  She reports adherence with Entresto .  Discussed with patient restarting amlodipine  versus increasing her Entresto , patient declines increasing Entresto , reports that this was previously tried and resulted in her being driven mad.  Patient agreeable to starting amlodipine  at 5 mg daily, encouraged to monitor her blood pressure at home.  Discussed low-sodium diet with patient, she will discontinue use of sea salt.  Will have patient schedule a close follow-up appointment with pharmacy team, encouraged patient to bring her blood pressure cuff, all medication bottles and blood pressure log to follow-up appointment. Reviewed ED precautions. Check CBC and BMET.  Per patient she had hives while on spironolactone  or lasix , she has tolerated as needed torsemide  well without reaction.  Pericardial effusion: Cardiac MRI in 02/2023 showed evidence of a large circumferential pericardial effusion with no signs of tamponade.  Repeat echo in 03/2023 indicated EF 50 to 55%, low normal LV function, possible basal inferior hypokinesis, moderate concentric LVH, G1 DD, normal RV systolic function, moderate pericardial effusion with no evidence of tamponade.  Patient remains asymptomatic.  Check echocardiogram for monitoring of pericardial effusion.  LVH: There is noted concern for amyloid pattern on prior echocardiogram, she underwent PYP scan which revealed positive uptake.  Follow-up cardiac MRI showed moderate LVH, EF 51%, no late gadolinium uptake, not consistent with amyloid.  Chronic systolic heart failure with improved EF: Prior EF 30 to 35%, echocardiogram in 03/2023 showed EF 50 to 55%, moderate LVH.  Patient denies any increased lower extremity edema today, appears  euvolemic and well compensated on exam.  Continue as needed torsemide .   Disposition: F/u with PharmD clinic in two weeks, Delmont Prosch, NP in 8 weeks.   Signed, Drake [place] D Leng Montesdeoca, NP

## 2024-03-03 NOTE — Patient Instructions (Signed)
 Medication Instructions:  START amlodipine  5 mg daily   Please check to see if you are Nebivolol  and let the office know.   *If you need a refill on your cardiac medications before your next appointment, please call your pharmacy*  Lab Work: CBC BMP  If you have labs (blood work) drawn today and your tests are completely normal, you will receive your results only by: MyChart Message (if you have MyChart) OR A paper copy in the mail If you have any lab test that is abnormal or we need to change your treatment, we will call you to review the results.  Testing/Procedures: Pharm D Referral   ECHOCARDIOGRAM  Your physician has requested that you have an echocardiogram. Echocardiography is a painless test that uses sound waves to create images of your heart. It provides your doctor with information about the size and shape of your heart and how well your heart's chambers and valves are working. This procedure takes approximately one hour. There are no restrictions for this procedure. Please do NOT wear cologne, perfume, aftershave, or lotions (deodorant is allowed). Please arrive 15 minutes prior to your appointment time.  Please note: We ask at that you not bring children with you during ultrasound (echo/ vascular) testing. Due to room size and safety concerns, children are not allowed in the ultrasound rooms during exams. Our front office staff cannot provide observation of children in our lobby area while testing is being conducted. An adult accompanying a patient to their appointment will only be allowed in the ultrasound room at the discretion of the ultrasound technician under special circumstances. We apologize for any inconvenience.   Follow-Up: At [facility], you and your health needs are our priority.  As part of our continuing mission to provide you with exceptional heart care, our providers are all part of one team.  This team includes your primary Cardiologist  (physician) and Advanced Practice Providers or APPs (Physician Assistants and Nurse Practitioners) who all work together to provide you with the care you need, when you need it.  Your next appointment:   6 week(s)  Provider:   Katlyn West, NP

## 2024-03-03 NOTE — Telephone Encounter (Signed)
 Delford Maude BROCKS, MD to [facility] Triage  West, Katlyn D, NP  (Selected Message)   03/03/24  2:33 PM Increase entresto  to max dose ? Issue with this in past but BP very high Has had chronic pericardial effusion and would not use hydralazine  for now. Add Norvasc  10 mg    To be addressed at appointment today

## 2024-03-03 NOTE — Telephone Encounter (Signed)
Left message and call back number.

## 2024-03-03 NOTE — Telephone Encounter (Signed)
 Had to keep a log of her BP, she reports 9/22- am 190/102 Pm 193/98 9/24- 190/102 9/25- 198/106,  190/102 9/26- 196/119,  198/106 9/28-190/105,  10/1- 175/102, 175/107 10/2-  at Weight appt they checked it and they said it is still too high  Able to get appt made for today 03/03/24 at 3:10 with APP

## 2024-03-03 NOTE — Telephone Encounter (Signed)
 Pt c/o medication issue:  1. Name of Medication:  sacubitril -valsartan  (ENTRESTO ) 49-51 MG   carvedilol  (COREG ) 25 MG tablet    2. How are you currently taking this medication (dosage and times per day)?  As prescribed  3. Are you having a reaction (difficulty breathing--STAT)?   4. What is your medication issue?   Patient says she doesn't think BP medications are working the same anymore and she would like to discuss switching them. She says on 9/22 her BP was 190/105. She hasn't checked it yet today. Patient mentions additional stress recently but no symptoms.

## 2024-03-04 ENCOUNTER — Ambulatory Visit: Payer: Self-pay | Admitting: Cardiology

## 2024-03-04 LAB — CBC
Hematocrit: 41.1 % (ref 34.0–46.6)
Hemoglobin: 13.4 g/dL (ref 11.1–15.9)
MCH: 28.6 pg (ref 26.6–33.0)
MCHC: 32.6 g/dL (ref 31.5–35.7)
MCV: 88 fL (ref 79–97)
Platelets: 284 x10E3/uL (ref 150–450)
RBC: 4.69 x10E6/uL (ref 3.77–5.28)
RDW: 13.1 % (ref 11.7–15.4)
WBC: 7.4 x10E3/uL (ref 3.4–10.8)

## 2024-03-04 LAB — BASIC METABOLIC PANEL WITH GFR
BUN/Creatinine Ratio: 26 (ref 12–28)
BUN: 20 mg/dL (ref 8–27)
CO2: 24 mmol/L (ref 20–29)
Calcium: 10.5 mg/dL — AB (ref 8.7–10.3)
Chloride: 102 mmol/L (ref 96–106)
Creatinine, Ser: 0.76 mg/dL (ref 0.57–1.00)
Glucose: 83 mg/dL (ref 70–99)
Potassium: 3.9 mmol/L (ref 3.5–5.2)
Sodium: 140 mmol/L (ref 134–144)
eGFR: 82 mL/min/1.73 (ref >59)

## 2024-03-04 NOTE — Progress Notes (Signed)
 Attempted to call patient back, no answer, left a vm

## 2024-03-04 NOTE — Progress Notes (Signed)
 Attempted to call patient, for lab results no answer left a vm to call back.  Patient never viewed in mychart. Printed, highlighted doctor comments and mailed to patient

## 2024-03-04 NOTE — Telephone Encounter (Signed)
 Patient is returning call. She is requesting to have her results put on MyChart if possible.

## 2024-03-04 NOTE — Telephone Encounter (Signed)
 Pt is calling wanting someone to call her and go over results

## 2024-03-05 ENCOUNTER — Encounter: Payer: Self-pay | Admitting: Cardiology

## 2024-03-09 ENCOUNTER — Ambulatory Visit

## 2024-03-09 VITALS — Ht 61.0 in | Wt 201.0 lb

## 2024-03-09 DIAGNOSIS — Z0000 Encounter for general adult medical examination without abnormal findings: Secondary | ICD-10-CM | POA: Diagnosis not present

## 2024-03-09 NOTE — Patient Instructions (Addendum)
 Ms. Rodeheaver,  Thank you for taking the time for your Medicare Wellness Visit. I appreciate your continued commitment to your health goals. Please review the care plan we discussed, and feel free to reach out if I can assist you further.  Medicare recommends these wellness visits once per year to help you and your care team stay ahead of potential health issues. These visits are designed to focus on prevention, allowing your provider to concentrate on managing your acute and chronic conditions during your regular appointments.  Please note that Annual Wellness Visits do not include a physical exam. Some assessments may be limited, especially if the visit was conducted virtually. If needed, we may recommend a separate in-person follow-up with your provider.  Ongoing Care Seeing your primary care provider every 3 to 6 months helps us  monitor your health and provide consistent, personalized care.   Referrals If a referral was made during today's visit and you haven't received any updates within two weeks, please contact the referred provider directly to check on the status.  Recommended Screenings:  Health Maintenance  Topic Date Due   Zoster (Shingles) Vaccine (1 of 2) Never done   Eye exam for diabetics  09/25/2021   Flu Shot  12/25/2023   COVID-19 Vaccine (4 - 2025-26 season) 01/25/2024   DTaP/Tdap/Td vaccine (2 - Td or Tdap) 05/12/2024   Hemoglobin A1C  05/26/2024   Yearly kidney health urinalysis for diabetes  08/02/2024   Breast Cancer Screening  08/24/2024   Complete foot exam   10/25/2024   Yearly kidney function blood test for diabetes  03/03/2025   Medicare Annual Wellness Visit  03/09/2025   Colon Cancer Screening  04/29/2028   Pneumococcal Vaccine for age over 65  Completed   DEXA scan (bone density measurement)  Completed   Hepatitis C Screening  Completed   Meningitis B Vaccine  Aged Out       03/09/2024    1:26 PM  Advanced Directives  Does Patient Have a Medical  Advance Directive? Yes  Type of Estate agent of [place];Living will  Does patient want to make changes to medical advance directive? No - Patient declined  Copy of Healthcare Power of Attorney in Chart? Yes - validated most recent copy scanned in chart (See row information)   Advance Care Planning is important because it: Ensures you receive medical care that aligns with your values, goals, and preferences. Provides guidance to your family and loved ones, reducing the emotional burden of decision-making during critical moments.  Vision: Annual vision screenings are recommended for early detection of glaucoma, cataracts, and diabetic retinopathy. These exams can also reveal signs of chronic conditions such as diabetes and high blood pressure.  Dental: Annual dental screenings help detect early signs of oral cancer, gum disease, and other conditions linked to overall health, including heart disease and diabetes.  Please see the attached documents for additional preventive care recommendations.

## 2024-03-09 NOTE — Progress Notes (Signed)
 Subjective:   Beth Macias is a 73 y.o. who presents for a Medicare Wellness preventive visit.  As a reminder, Annual Wellness Visits don't include a physical exam, and some assessments may be limited, especially if this visit is performed virtually. We may recommend an in-person follow-up visit with your provider if needed.  Visit Complete: Virtual I connected with  Beth Macias on 03/09/24 by a audio enabled telemedicine application and verified that I am speaking with the correct person using two identifiers.  Patient Location: Home  Provider Location: Home Office  I discussed the limitations of evaluation and management by telemedicine. The patient expressed understanding and agreed to proceed.  Vital Signs: Because this visit was a virtual/telehealth visit, some criteria may be missing or patient reported. Any vitals not documented were not able to be obtained and vitals that have been documented are patient reported.    Persons Participating in Visit: Patient.  AWV Questionnaire: No: Patient Medicare AWV questionnaire was not completed prior to this visit.  Cardiac Risk Factors include: advanced age (>86men, >14 women);diabetes mellitus;hypertension     Objective:    Today's Vitals   03/09/24 1314  Weight: 201 lb (91.2 kg)  Height: 5' 1 (1.549 m)   Body mass index is 37.98 kg/m.     03/09/2024    1:26 PM 09/22/2022    3:25 PM 09/26/2021    3:39 PM 09/18/2021    3:17 PM 10/28/2020   12:00 AM 09/17/2020    3:04 PM 10/25/2019    7:38 AM  Advanced Directives  Does Patient Have a Medical Advance Directive? Yes Yes Yes Yes Yes Yes Yes  Type of Estate agent of [place];Living will Healthcare Power of [place];Living will  Healthcare Power of [place];Living will Healthcare Power of [employer] of [place];Living will   Does patient want to make changes to medical advance directive? No - Patient declined No - Patient declined No  - Patient declined No - Patient declined No - Patient declined No - Patient declined Yes (Inpatient - patient defers changing a medical advance directive and declines information at this time)  Copy of Healthcare Power of Attorney in Chart? Yes - validated most recent copy scanned in chart (See row information) Yes - validated most recent copy scanned in chart (See row information)  No - copy requested No - copy requested No - copy requested     Current Medications (verified) Outpatient Encounter Medications as of 03/09/2024  Medication Sig   albuterol  (VENTOLIN  HFA) 108 (90 Base) MCG/ACT inhaler Inhale 2 puffs into the lungs every 4 (four) hours as needed for wheezing or shortness of breath.   allopurinol  (ZYLOPRIM ) 100 MG tablet Take 1 tablet (100 mg total) by mouth daily.   amLODipine  (NORVASC ) 5 MG tablet Take 1 tablet (5 mg total) by mouth daily.   Apoaequorin (PREVAGEN) 10 MG CAPS Take by mouth.   atorvastatin  (LIPITOR) 20 MG tablet Take 1 tablet (20 mg total) by mouth daily.   B Complex-Minerals (ELDERTONIC PO) Take 15 mLs by mouth daily. Geritol   blood glucose meter kit and supplies KIT Dispense based on patient and insurance preference. Use up to four times daily as directed.   Blood Pressure Monitoring (BLOOD PRESSURE CUFF) MISC For daily monitoring as directed.   Calcium -Magnesium -Vitamin D  (CALCIUM  1200+D3 PO) Take 600 mg by mouth daily.   carvedilol  (COREG ) 25 MG tablet Take 1 tablet (25 mg total) by mouth 2 (two) times daily.  cholecalciferol (VITAMIN D ) 25 MCG (1000 UNIT) tablet Take 1,000 Units by mouth at bedtime.    Cyanocobalamin  ([employer] B12 GUMMIES PO) Take 1,000 mcg by mouth daily.   cyclobenzaprine (FLEXERIL) 10 MG tablet    EPINEPHrine (EPIPEN 2-PAK) 0.3 mg/0.3 mL IJ SOAJ injection INJECT 0.3 MLS INTO THE MUSCLE PRN FOR ANAPHYLAXIS (Patient not taking: Reported on 03/03/2024)   famotidine  (PEPCID ) 40 MG tablet Take 1 tablet (40 mg total) by mouth in the morning.    fluticasone  (FLONASE ) 50 MCG/ACT nasal spray Place 1 spray into both nostrils daily.   ibuprofen (ADVIL) 600 MG tablet TK 1 T PO BID WITH FOOD PRN P   indomethacin (INDOCIN) 25 MG capsule 1 capsule with food Orally Twice a day; Duration: 30 day(s)   ipratropium-albuterol  (DUONEB) 0.5-2.5 (3) MG/3ML SOLN Take 3 mLs by nebulization every 6 (six) hours as needed (shortness of breath/wheezing).   levocetirizine (XYZAL ) 5 MG tablet Take 1 tablet (5 mg total) by mouth every evening.   metoprolol tartrate (LOPRESSOR) 25 MG tablet TK 1 T PO D   montelukast  (SINGULAIR ) 10 MG tablet Take 1 tablet (10 mg total) by mouth daily.   Nebivolol  HCl 20 MG TABS Take by mouth daily.   omeprazole  (PRILOSEC) 20 MG capsule 1 capsule.   ONETOUCH ULTRA test strip USE TO TEST BLOOD SUGAR UP TO FOUR TIMES DAILY   potassium chloride  20 MEQ/15ML (10%) SOLN TAKE 15 MLS BY MOUTH DAILY AS NEEDED(WITH EACH DOSE OF TORSEMIDE )   sacubitril -valsartan  (ENTRESTO ) 49-51 MG Take 1 tablet by mouth 2 (two) times daily.   tirzepatide  (MOUNJARO ) 10 MG/0.5ML Pen Inject 10 mg into the skin once a week.   torsemide  (DEMADEX ) 20 MG tablet Take 1 tablet (20 mg total) by mouth daily as needed (swelling/fluid/weight gain).   triamcinolone  cream (KENALOG ) 0.1 % Apply topically 2 (two) times daily.   vitamin C (ASCORBIC ACID) 500 MG tablet Take 500 mg by mouth daily.   No facility-administered encounter medications on file as of 03/09/2024.    Allergies (verified) Strawberry (diagnostic), Farxiga  [dapagliflozin ], Penicillins, Amoxicillin, Furosemide , Rosuvastatin , Spironolactone , Gabapentin, and Tape   History: Past Medical History:  Diagnosis Date   Anxiety    Asthma    Back pain    Blood in stool    Chewing difficulty    CHF (congestive heart failure) (HCC)    Colon polyps    Constipation    Diabetes mellitus without complication (HCC)    Diverticulitis    Fibromyalgia    GERD (gastroesophageal reflux disease)    Gout     Hypercholesteremia    Hypertension    Joint pain    Lactose intolerance    Pneumonia    Pneumonia    Prediabetes    SOB (shortness of breath)    UTI (urinary tract infection)    Vitamin B 12 deficiency    Vitamin D  deficiency    Past Surgical History:  Procedure Laterality Date   APPENDECTOMY     CESAREAN SECTION     x 3   LIPOMA EXCISION     MENISCUS REPAIR Left    OVARIAN CYST REMOVAL     OVARIAN CYST SURGERY     Family History  Problem Relation Age of Onset   Heart failure Mother    Gout Mother    Hypertension Mother    Diabetes Mother    High Cholesterol Mother    Heart disease Mother    Obesity Mother    Heart failure Father  Hypertension Father    AAA (abdominal aortic aneurysm) Father    Heart disease Father    Gout Brother    Hypertension Brother    Heart attack Brother    Colon cancer Maternal Uncle    Crohn's disease Paternal Aunt    Breast cancer Neg Hx    Esophageal cancer Neg Hx    Pancreatic cancer Neg Hx    Stomach cancer Neg Hx    Social History   Socioeconomic History   Marital status: Legally Separated    Spouse name: Not on file   Number of children: 3   Years of education: Not on file   Highest education level: Not on file  Occupational History   Occupation: Runner, broadcasting/film/video   Occupation: Retired Psychologist, forensic - 2020  Tobacco Use   Smoking status: Former    Current packs/day: 0.00    Average packs/day: 0.3 packs/day for 9.0 years (2.3 ttl pk-yrs)    Types: Cigarettes    Start date: 05/27/1967    Quit date: 05/26/1976    Years since quitting: 47.8   Smokeless tobacco: Never  Vaping Use   Vaping status: Never Used  Substance and Sexual Activity   Alcohol use: Never   Drug use: Never   Sexual activity: Yes  Other Topics Concern   Not on file  Social History Narrative   Not on file   Social Drivers of Health   Financial Resource Strain: Low Risk  (03/09/2024)   Overall Financial Resource Strain (CARDIA)    Difficulty of Paying  Living Expenses: Not hard at all  Food Insecurity: No Food Insecurity (03/09/2024)   Hunger Vital Sign    Worried About Running Out of Food in the Last Year: Never true    Ran Out of Food in the Last Year: Never true  Transportation Needs: No Transportation Needs (03/09/2024)   PRAPARE - Administrator, Civil Service (Medical): No    Lack of Transportation (Non-Medical): No  Physical Activity: Inactive (03/09/2024)   Exercise Vital Sign    Days of Exercise per Week: 0 days    Minutes of Exercise per Session: 0 min  Stress: No Stress Concern Present (03/09/2024)   [employer] of Occupational Health - Occupational Stress Questionnaire    Feeling of Stress: Not at all  Social Connections: Moderately Integrated (03/09/2024)   Social Connection and Isolation Panel    Frequency of Communication with Friends and Family: More than three times a week    Frequency of Social Gatherings with Friends and Family: More than three times a week    Attends Religious Services: More than 4 times per year    Active Member of [employer] or Organizations: Yes    Attends Engineer, structural: More than 4 times per year    Marital Status: Separated    Tobacco Counseling Counseling given: Not Answered    Clinical Intake:  Pre-visit preparation completed: Yes  Pain : No/denies pain     BMI - recorded: 37.98 Nutritional Status: BMI > 30  Obese Nutritional Risks: None Diabetes: Yes Did pt. bring in CBG monitor from home?: No  Lab Results  Component Value Date   HGBA1C 5.8 11/24/2023   HGBA1C 5.7 08/03/2023   HGBA1C 6.8 (H) 03/17/2023     How often do you need to have someone help you when you read instructions, pamphlets, or other written materials from your doctor or pharmacy?: 1 - Never  Interpreter Needed?: No  Information entered  by :: Rojelio Blush LPN   Activities of Daily Living     03/09/2024    1:24 PM  In your present state of health, do you have any  difficulty performing the following activities:  Hearing? 0  Vision? 0  Difficulty concentrating or making decisions? 0  Walking or climbing stairs? 0  Dressing or bathing? 0  Doing errands, shopping? 0  Preparing Food and eating ? N  Using the Toilet? N  In the past six months, have you accidently leaked urine? N  Do you have problems with loss of bowel control? N  Managing your Medications? N  Managing your Finances? N  Housekeeping or managing your Housekeeping? N    Patient Care Team: Mercer Clotilda SAUNDERS, MD as PCP - General (Family Medicine) Delford Maude BROCKS, MD as PCP - Cardiology (Cardiology) Verta Royden DASEN, DPM as Consulting Physician (Podiatry) Nelson, Jon DEL, [facility] (Pharmacist) Hobart, Ritesh, OD (Optometry)  I have updated your Care Teams any recent Medical Services you may have received from other providers in the past year.     Assessment:   This is a routine wellness examination for Beth Macias.  Hearing/Vision screen Hearing Screening - Comments:: Denies hearing difficulties   Vision Screening - Comments:: Wears rx glasses - up to date with routine eye exams with  [facility]   Goals Addressed               This Visit's Progress     Increase physical activity (pt-stated)         Depression Screen     03/09/2024    1:20 PM 11/06/2023    4:32 PM 08/03/2023    9:50 AM 04/17/2023    9:41 AM 09/23/2022   10:23 AM 09/22/2022    3:19 PM 09/26/2021    3:39 PM  PHQ 2/9 Scores  PHQ - 2 Score 0 0 0 0 0 0 0  PHQ- 9 Score  4 0 1 1      Fall Risk     03/09/2024    1:25 PM 11/06/2023    4:35 PM 08/03/2023    9:50 AM 08/03/2023    9:15 AM 04/17/2023    9:41 AM  Fall Risk   Falls in the past year? 1 1 0 0 0  Number falls in past yr: 0 0 0 0 0  Injury with Fall? 1 1 0 0 0  Comment Left Arm injury. Followed by medical attention      Risk for fall due to : No Fall Risks  No Fall Risks No Fall Risks No Fall Risks  Follow up Falls evaluation completed  Falls  evaluation completed Falls evaluation completed Falls evaluation completed    MEDICARE RISK AT HOME:   Medicare Risk at Home Any stairs in or around the home?: Yes If so, are there any without handrails?: No Home free of loose throw rugs in walkways, pet beds, electrical cords, etc?: Yes Adequate lighting in your home to reduce risk of falls?: Yes Life alert?: No Use of a cane, walker or w/c?: No Grab bars in the bathroom?: Yes Shower chair or bench in shower?: No Elevated toilet seat or a handicapped toilet?: No  TIMED UP AND GO:  Was the test performed?  No  Cognitive Function: 6CIT completed        03/09/2024    1:26 PM 09/22/2022    3:25 PM 09/18/2021    3:18 PM  6CIT Screen  What Year? 0  points 0 points 0 points  What month? 0 points 0 points 0 points  What time? 0 points 0 points 0 points  Count back from 20 0 points 0 points 0 points  Months in reverse 0 points 0 points 0 points  Repeat phrase 0 points 0 points 0 points  Total Score 0 points 0 points 0 points    Immunizations Immunization History  Administered Date(s) Administered   Fluad Quad(high Dose 65+) 03/07/2019, 07/12/2021   Fluad Trivalent(High Dose 65+) 04/17/2023   INFLUENZA, HIGH DOSE SEASONAL PF 04/23/2015, 03/04/2016   Influenza Whole 02/23/2018   Influenza, Seasonal, Injecte, Preservative Fre 03/28/2013, 05/12/2014   PFIZER(Purple Top)SARS-COV-2 Vaccination 07/21/2019, 08/16/2019, 02/29/2020   PPD Test 02/18/2016   Pneumococcal Conjugate-13 04/23/2015   Pneumococcal Polysaccharide-23 08/11/2013, 06/29/2019   Tdap 05/12/2014    Screening Tests Health Maintenance  Topic Date Due   Zoster Vaccines- Shingrix (1 of 2) Never done   OPHTHALMOLOGY EXAM  09/25/2021   Influenza Vaccine  12/25/2023   COVID-19 Vaccine (4 - 2025-26 season) 01/25/2024   DTaP/Tdap/Td (2 - Td or Tdap) 05/12/2024   HEMOGLOBIN A1C  05/26/2024   Diabetic kidney evaluation - Urine ACR  08/02/2024   Mammogram  08/24/2024    FOOT EXAM  10/25/2024   Diabetic kidney evaluation - eGFR measurement  03/03/2025   Medicare Annual Wellness (AWV)  03/09/2025   Colonoscopy  04/29/2028   Pneumococcal Vaccine: 50+ Years  Completed   DEXA SCAN  Completed   Hepatitis C Screening  Completed   Meningococcal B Vaccine  Aged Out    Health Maintenance Items Addressed:   Additional Screening:  Vision Screening: Recommended annual ophthalmology exams for early detection of glaucoma and other disorders of the eye. Is the patient up to date with their annual eye exam?  Yes  Who is the provider or what is the name of the office in which the patient attends annual eye exams? [facility]  Dental Screening: Recommended annual dental exams for proper oral hygiene  Community Resource Referral / Chronic Care Management: CRR required this visit?  No   CCM required this visit?  No   Plan:    I have personally reviewed and noted the following in the patient's chart:   Medical and social history Use of alcohol, tobacco or illicit drugs  Current medications and supplements including opioid prescriptions. Patient is not currently taking opioid prescriptions. Functional ability and status Nutritional status Physical activity Advanced directives List of other physicians Hospitalizations, surgeries, and ER visits in previous 12 months Vitals Screenings to include cognitive, depression, and falls Referrals and appointments  In addition, I have reviewed and discussed with patient certain preventive protocols, quality metrics, and best practice recommendations. A written personalized care plan for preventive services as well as general preventive health recommendations were provided to patient.   Rojelio LELON Blush, LPN   [id number]   After Visit Summary: (MyChart) Due to this being a telephonic visit, the after visit summary with patients personalized plan was offered to patient via MyChart   Notes: Nothing significant to  report at this time.

## 2024-03-16 ENCOUNTER — Ambulatory Visit: Attending: Cardiology

## 2024-03-16 VITALS — BP 142/89 | HR 85

## 2024-03-16 DIAGNOSIS — E1159 Type 2 diabetes mellitus with other circulatory complications: Secondary | ICD-10-CM

## 2024-03-16 DIAGNOSIS — I152 Hypertension secondary to endocrine disorders: Secondary | ICD-10-CM

## 2024-03-16 NOTE — Patient Instructions (Addendum)
 Changes made by your pharmacist Aaradhya Kysar E. Jemuel Laursen, PharmD at today's visit:    Instructions/Changes  (what do you need to do) Your Notes  (what you did and when you did it)  Continue carvedilol  25 mg twice daily, Entresto  49-51 mg twice daily, amlodipine  5 mg daily, and torsemide  20 mg as needed   2. Check your blood pressure daily and       bring blood pressure log to next      appointment   3. Call to make appointment once aware of      availability (5 weeks)     Bring all of your meds, your BP cuff and your record of home blood pressures to your next appointment.    HOW TO TAKE YOUR BLOOD PRESSURE AT HOME  Rest 5 minutes before taking your blood pressure.  Don't smoke or drink caffeinated beverages for at least 30 minutes before. Take your blood pressure before (not after) you eat. Sit comfortably with your back supported and both feet on the floor (don't cross your legs). Elevate your arm to heart level on a table or a desk. Use the proper sized cuff. It should fit smoothly and snugly around your bare upper arm. There should be enough room to slip a fingertip under the cuff. The bottom edge of the cuff should be 1 inch above the crease of the elbow. Ideally, take 3 measurements at one sitting and record the average.  Yaman Grauberger E. Laytoya Ion, Pharm.D [place] [facility]. [facility] [address], [place], [place] [address] Phone: [phone]; Fax: [phone]    Important lifestyle changes to control high blood pressure  Intervention  Effect on the BP  Lose extra pounds and watch your waistline Weight loss is one of the most effective lifestyle changes for controlling blood pressure. If you're overweight or obese, losing even a small amount of weight can help reduce blood pressure. Blood pressure might go down by about 1 millimeter of mercury (mm Hg) with each kilogram (about 2.2 pounds) of weight lost.  Exercise regularly As a general goal, aim  for at least 30 minutes of moderate physical activity every day. Regular physical activity can lower high blood pressure by about 5 to 8 mm Hg.  Eat a healthy diet Eating a diet rich in whole grains, fruits, vegetables, and low-fat dairy products and low in saturated fat and cholesterol. A healthy diet can lower high blood pressure by up to 11 mm Hg.  Reduce salt (sodium) in your diet Even a small reduction of sodium in the diet can improve heart health and reduce high blood pressure by about 5 to 6 mm Hg.  Limit alcohol One drink equals 12 ounces of beer, 5 ounces of wine, or 1.5 ounces of 80-proof liquor.  Limiting alcohol to less than one drink a day for women or two drinks a day for men can help lower blood pressure by about 4 mm Hg.   If you have any questions or concerns please use My Chart to send questions or call the office at [phone]

## 2024-03-16 NOTE — Progress Notes (Signed)
 Patient ID: Beth Macias                 DOB: Jan 31, 1951                      MRN: [id number]      HPI: Beth Macias is a 73 y.o. female referred by Katelyn West, NP to HTN clinic. PMH is significant for HTN, T2D, HLD, HFpEF, gout, and asthma.   Patient presents today for PharmD HTN clinic. Patient was recently seen by cardiology provider for HTN management where amlodipine  5 mg was initiated. At that visit patient expressed some confusion regarding what medications she's been taking because multiple doctors have been addressing her medications. Med rec completed today and patient made note of which medications she is no longer taking. She reports that she is taking carvedilol  25 mg twice daily, Entresto  49-51 mg twice daily, and amlodipine  5 mg daily for HTN management. She is also taking torsemide  20 mg prn for swelling in ankles but hasn't needed it lately. She denies any issues since starting amlodipine .   She did not bring blood pressure machine or home BP readings but recalls her blood pressure has improved since amlodipine  was added at last visit. She reports home BP ranging from 120s-150s/80s at home. She denies any SOB, chest pain or headache.She reports not cooking with or adding salt to her food. She is drinking water ~1-2 bottles per day.   Current HTN meds: carvedilol  25 mg twice daily, Entresto  49-51 mg twice daily, amlodipine  5 mg daily, torsemide  20 mg prn swelling  Previously tried: spironolactone  (hives), lasix  (hives), Entresto  97-103 mg (driven mad) BP goal: < 130/80  Family History:  Relation Problem Comments  Mother (Deceased) Diabetes   Gout   Heart disease   Heart failure   High Cholesterol   Hypertension   Obesity     Father (Deceased) AAA (abdominal aortic aneurysm)   Heart disease   Heart failure   Hypertension     Sister (Alive)   Brother Gout   Heart attack   Hypertension     Maternal Uncle Colon cancer     Paternal Aunt Crohn's disease      Neg Hx Breast cancer   Esophageal cancer   Pancreatic cancer   Stomach cancer      Social History:  Alcohol: none Smoking: none  Diet:  Patient reports low salt diet. She does not add or cook with salt. She states that she may eat cookies that contain salt but she is being mindful of sodium content. She consumes mostly water and amount varies (~1-2 bottles per day)   Home BP readings:  No log provided. Reports 120s-150s/80s   Wt Readings from Last 3 Encounters:  03/09/24 201 lb (91.2 kg)  03/03/24 202 lb (91.6 kg)  02/25/24 200 lb (90.7 kg)   BP Readings from Last 3 Encounters:  03/16/24 (!) 142/89  03/03/24 (!) 172/92  02/25/24 (!) 168/94   Pulse Readings from Last 3 Encounters:  03/16/24 85  03/03/24 80  02/25/24 75    Renal function: Estimated Creatinine Clearance: 63.5 mL/min (by C-G formula based on SCr of 0.76 mg/dL).  Past Medical History:  Diagnosis Date   Anxiety    Asthma    Back pain    Blood in stool    Chewing difficulty    CHF (congestive heart failure) (HCC)    Colon polyps    Constipation    Diabetes mellitus  without complication (HCC)    Diverticulitis    Fibromyalgia    GERD (gastroesophageal reflux disease)    Gout    Hypercholesteremia    Hypertension    Joint pain    Lactose intolerance    Pneumonia    Pneumonia    Prediabetes    SOB (shortness of breath)    UTI (urinary tract infection)    Vitamin B 12 deficiency    Vitamin D  deficiency     Current Outpatient Medications on File Prior to Visit  Medication Sig Dispense Refill   albuterol  (VENTOLIN  HFA) 108 (90 Base) MCG/ACT inhaler Inhale 2 puffs into the lungs every 4 (four) hours as needed for wheezing or shortness of breath. 8.5 each 5   allopurinol  (ZYLOPRIM ) 100 MG tablet Take 1 tablet (100 mg total) by mouth daily. 30 tablet 6   amLODipine  (NORVASC ) 5 MG tablet Take 1 tablet (5 mg total) by mouth daily. 90 tablet 3   blood glucose meter kit and supplies KIT Dispense  based on patient and insurance preference. Use up to four times daily as directed. 1 each 0   Blood Pressure Monitoring (BLOOD PRESSURE CUFF) MISC For daily monitoring as directed. 1 each 0   Calcium -Magnesium -Vitamin D  (CALCIUM  1200+D3 PO) Take 600 mg by mouth daily.     carvedilol  (COREG ) 25 MG tablet Take 1 tablet (25 mg total) by mouth 2 (two) times daily. 180 tablet 3   famotidine  (PEPCID ) 40 MG tablet Take 1 tablet (40 mg total) by mouth in the morning. 90 tablet 3   fluticasone  (FLONASE ) 50 MCG/ACT nasal spray Place 1 spray into both nostrils daily. 16 g 6   ipratropium-albuterol  (DUONEB) 0.5-2.5 (3) MG/3ML SOLN Take 3 mLs by nebulization every 6 (six) hours as needed (shortness of breath/wheezing).     levocetirizine (XYZAL ) 5 MG tablet Take 1 tablet (5 mg total) by mouth every evening. 90 tablet 1   ONETOUCH ULTRA test strip USE TO TEST BLOOD SUGAR UP TO FOUR TIMES DAILY 100 strip 3   potassium chloride  20 MEQ/15ML (10%) SOLN TAKE 15 MLS BY MOUTH DAILY AS NEEDED(WITH EACH DOSE OF TORSEMIDE ) 1373 mL 3   sacubitril -valsartan  (ENTRESTO ) 49-51 MG Take 1 tablet by mouth 2 (two) times daily. 180 tablet 3   tirzepatide  (MOUNJARO ) 10 MG/0.5ML Pen Inject 10 mg into the skin once a week. 2 mL 1   torsemide  (DEMADEX ) 20 MG tablet Take 1 tablet (20 mg total) by mouth daily as needed (swelling/fluid/weight gain). 90 tablet 1   atorvastatin  (LIPITOR) 20 MG tablet Take 1 tablet (20 mg total) by mouth daily. 90 tablet 0   EPINEPHrine (EPIPEN 2-PAK) 0.3 mg/0.3 mL IJ SOAJ injection INJECT 0.3 MLS INTO THE MUSCLE PRN FOR ANAPHYLAXIS (Patient not taking: Reported on 03/16/2024)     No current facility-administered medications on file prior to visit.    Allergies  Allergen Reactions   Strawberry (Diagnostic) Anaphylaxis and Other (See Comments)    hives   Farxiga  [Dapagliflozin ] Other (See Comments)    Had HAs, weakness, rhinorrhea, jitteriness, nasal congestion, nocturia, yeast infections, memory  changes, irritability.   Penicillins Itching    Has patient had a PCN reaction causing immediate rash, facial/tongue/throat swelling, SOB or lightheadedness with hypotension: No Has patient had a PCN reaction causing severe rash involving mucus membranes or skin necrosis: No Has patient had a PCN reaction that required hospitalization: Unknown Has patient had a PCN reaction occurring within the last 10 years: No If all of  the above answers are NO, then may proceed with Cephalosporin use.   Amoxicillin Other (See Comments)   Furosemide  Hives   Rosuvastatin  Other (See Comments)    Bloating, flatulence   Spironolactone  Hives   Gabapentin Itching, Rash and Other (See Comments)    Incoherent   Tape Other (See Comments)    Blisters    Blood pressure (!) 142/89, pulse 85.   Assessment/Plan:  1. Hypertension -  Hypertension associated with type 2 diabetes mellitus (HCC) Assessment: BP is uncontrolled in office, BP 146/ 89 and repeat 142/89 mmHg above the goal (<130/80).  No home BP log to trend BP. However, office BP largely improved from 172/92 at last visit  Patient's blood pressure responding well to amlodipine   Tolerates carvedilol , Entresto  and amlodipine  well without any side effects Denies SOB, palpitation, chest pain, headaches,or swelling Reiterated the importance of regular exercise and low salt diet   Plan:  Continue taking carvedilol  25 mg twice daily, Entresto  49-51 mg twice daily, amlodipine  5 mg daily, and torsemide  20 mg prn swelling  May consider increasing amlodipine  at next visit if home BP readings not at goal Patient to keep record of BP readings with heart rate and report to us  at the next visit Patient to bring blood pressure monitor to assess accuracy  Patient to see PharmD in 5 weeks for follow up; Patient will call back and schedule follow up once she knows availability    Thank you  Almendra Loria E. Smera Guyette, Pharm.D [facility] [address], [place], [place] [address] Phone: [phone]; Fax: [phone]

## 2024-03-16 NOTE — Assessment & Plan Note (Addendum)
 Assessment: BP is uncontrolled in office, BP 146/ 89 and repeat 142/89 mmHg above the goal (<130/80).  No home BP log to trend BP. However, office BP largely improved from 172/92 at last visit  Patient's blood pressure responding well to amlodipine   Tolerates carvedilol , Entresto  and amlodipine  well without any side effects Denies SOB, palpitation, chest pain, headaches,or swelling Reiterated the importance of regular exercise and low salt diet   Plan:  Continue taking carvedilol  25 mg twice daily, Entresto  49-51 mg twice daily, amlodipine  5 mg daily, and torsemide  20 mg prn swelling  May consider increasing amlodipine  at next visit if home BP readings not at goal Patient to keep record of BP readings with heart rate and report to us  at the next visit Patient to bring blood pressure monitor to assess accuracy  Patient to see PharmD in 5 weeks for follow up; Patient will call back and schedule follow up once she knows availability

## 2024-03-18 ENCOUNTER — Telehealth: Payer: Self-pay

## 2024-03-25 ENCOUNTER — Other Ambulatory Visit: Payer: Self-pay | Admitting: Nurse Practitioner

## 2024-03-26 DIAGNOSIS — E119 Type 2 diabetes mellitus without complications: Secondary | ICD-10-CM | POA: Diagnosis not present

## 2024-04-01 NOTE — Progress Notes (Signed)
 Beth Macias                                          MRN: [id number]   04/01/2024   The [facility] Quality Team Specialist reviewed this patient medical record for the purposes of chart review for care gap closure. The following were reviewed: chart review for care gap closure-controlling blood pressure.    [facility] Quality Team

## 2024-04-04 ENCOUNTER — Ambulatory Visit (INDEPENDENT_AMBULATORY_CARE_PROVIDER_SITE_OTHER): Payer: Self-pay | Admitting: Internal Medicine

## 2024-04-04 ENCOUNTER — Encounter (INDEPENDENT_AMBULATORY_CARE_PROVIDER_SITE_OTHER): Payer: Self-pay | Admitting: Internal Medicine

## 2024-04-04 VITALS — BP 151/79 | HR 82 | Temp 98.1°F | Ht 59.0 in | Wt 197.0 lb

## 2024-04-04 DIAGNOSIS — I152 Hypertension secondary to endocrine disorders: Secondary | ICD-10-CM | POA: Diagnosis not present

## 2024-04-04 DIAGNOSIS — E1159 Type 2 diabetes mellitus with other circulatory complications: Secondary | ICD-10-CM

## 2024-04-04 DIAGNOSIS — E66812 Obesity, class 2: Secondary | ICD-10-CM

## 2024-04-04 DIAGNOSIS — E119 Type 2 diabetes mellitus without complications: Secondary | ICD-10-CM

## 2024-04-04 DIAGNOSIS — Z6839 Body mass index (BMI) 39.0-39.9, adult: Secondary | ICD-10-CM

## 2024-04-04 DIAGNOSIS — Z7985 Long-term (current) use of injectable non-insulin antidiabetic drugs: Secondary | ICD-10-CM

## 2024-04-04 MED ORDER — TIRZEPATIDE 10 MG/0.5ML ~~LOC~~ SOAJ: 10.0000 mg | SUBCUTANEOUS | 1 refills | Status: DC

## 2024-04-04 NOTE — Progress Notes (Signed)
 Office: [phone]  /  Fax: [phone]  Weight Summary and Body Composition Analysis (BIA)  Vitals Temp: 98.1 F (36.7 C) BP: (!) 151/79 Pulse Rate: 82 SpO2: 98 %   Anthropometric Measurements Height: 4' 11 (1.499 m) Weight: 197 lb (89.4 kg) BMI (Calculated): 39.77 Weight at Last Visit: 200 lb Weight Lost Since Last Visit: 3 lb Weight Gained Since Last Visit: 0 lb Starting Weight: 220 lb Total Weight Loss (lbs): 23 lb (10.4 kg) Peak Weight: 230 lb   Body Composition  Body Fat %: 53.8 % Fat Mass (lbs): 106.4 lbs Muscle Mass (lbs): 86.8 lbs Visceral Fat Rating : 19    RMR: [id number]  Today's Visit #: 13  Starting Date: 03/17/23   Subjective   Chief Complaint: Obesity  Interval History Discussed the use of AI scribe software for clinical note transcription with the patient, who gave verbal consent to proceed.  History of Present Illness Beth Macias is a 73 year old female with hypertension and type 2 diabetes who presents for medical weight management.  Since last office visit she has lost 3 pounds.  She has not been following her plan as she was vacationing.  She has experienced a weight loss of three pounds since her last office visit and is currently taking Mounjaro  10 mg once a week. She has not been following a regular exercise plan but was more active during a recent two-week vacation, engaging in walking and socializing with friends. She plans to travel again in December to [place] with children.  She expresses frustration with her current blood pressure management and the need for adjustments. She has not consistently followed previous advice to monitor her blood pressure. She feels overwhelmed by medical advice from family and friends, many of whom are in the medical field.  She has a family history of heart conditions, with her brother having had a heart attack in his fifties. Her parents lived into their eighties and nineties,  respectively.  She lives with her daughter in a house and has access to exercise equipment at home, though she has not been using it regularly. She is interested in participating in a structured exercise program at the [facility].     Challenges affecting patient progress: low volume of physical activity at present , medical comorbidities, and menopause.    Pharmacotherapy for weight management: She is currently taking Monjauro with diabetes as the primary indication and obesity secondary with adequate clinical response  and without side effects..   Assessment and Plan   Treatment Plan For Obesity:  Recommended Dietary Goals  Beth Macias is currently in the action stage of change. As such, her goal is to continue weight management plan. She has agreed to: continue current plan  Behavioral Health and Counseling  We discussed the following behavioral modification strategies today: continue to work on maintaining a reduced calorie state, getting the recommended amount of protein, incorporating whole foods, making healthy choices, staying well hydrated and practicing mindfulness when eating. and increase protein intake, fibrous foods (25 grams per day for women, 30 grams for men) and water to improve satiety and decrease hunger signals. .  Additional education and resources provided today: Handout on traveling and holiday eating strategies  Recommended Physical Activity Goals  Beth Macias has been advised to work up to 150 minutes of moderate intensity aerobic activity a week and strengthening exercises 2-3 times per week for cardiovascular health, weight loss maintenance and preservation of muscle mass.  She has agreed to :  Think about enjoyable ways to increase daily physical activity and overcoming barriers to exercise, Increase physical activity in their day and reduce sedentary time (increase NEAT)., and patient agreeable to referral to the prep program at the [facility]  Medical Interventions and  Pharmacotherapy  We discussed various medication options to help Beth Macias with her weight loss efforts and we both agreed to : Adequate clinical response to anti-obesity medication, continue current regimen  Associated Conditions Impacted by Obesity Treatment  Assessment & Plan Type 2 diabetes mellitus in patient with obesity (HCC) HgbA1c is at goal for age and comorbid conditions. Denies symptoms of hypoglycemia or hyperglycemia. On Mounjaro  with good adherence and no side effects.   Lab Results  Component Value Date   HGBA1C 5.8 11/24/2023   HGBA1C 5.7 08/03/2023   HGBA1C 6.8 (H) 03/17/2023   Lab Results  Component Value Date   MICROALBUR 6.3 (H) 08/03/2023   LDLCALC 110 (H) 08/03/2023   CREATININE 0.76 03/03/2024   Continue current weight management strategy inclusive of GLP-1  Hypertension associated with type 2 diabetes mellitus (HCC) Blood pressure today is again elevated.  She has a history of diastolic heart failure and is followed by cardiology as well as primary care.  She is on carvedilol , amlodipine  and as needed torsemide .  She has been advised on journaling her blood pressure by cardiology but has not done so yet.  We counseled patient on the risk associated with uncontrolled blood pressure.  She may also have sleep apnea but is not interested in polysomnography.  She will be reaching out to her primary care doctor for medication adjustments. Class 2 severe obesity with serious comorbidity and body mass index (BMI) of 39.0 to 39.9 in adult, unspecified obesity type Obesity management is ongoing with Mounjaro  10 mg weekly. Recent weight loss of three pounds noted. She has not been following the exercise plan due to vacation but plans to resume physical activity. Discussed the importance of regular exercise and potential benefits of the PREP program at the [facility], which includes nutrition and physical instruction. - Continue Mounjaro  10 mg weekly -Work on reimplementation of  reduced calorie nutrition plan - Referred to PREP program at the [facility] for nutrition and physical instruction          Objective   Physical Exam:  Blood pressure (!) 151/79, pulse 82, temperature 98.1 F (36.7 C), height 4' 11 (1.499 m), weight 197 lb (89.4 kg), SpO2 98%. Body mass index is 39.79 kg/m.  General: She is overweight, cooperative, alert, well developed, and in no acute distress. PSYCH: Has normal mood, affect and thought process.   HEENT: EOMI, sclerae are anicteric. Lungs: Normal breathing effort, no conversational dyspnea. Extremities: No edema.  Neurologic: No gross sensory or motor deficits. No tremors or fasciculations noted.    Diagnostic Data Reviewed:  BMET    Component Value Date/Time   NA 140 03/03/2024 1700   K 3.9 03/03/2024 1700   CL 102 03/03/2024 1700   CO2 24 03/03/2024 1700   GLUCOSE 83 03/03/2024 1700   GLUCOSE 114 (H) 08/03/2023 1006   BUN 20 03/03/2024 1700   CREATININE 0.76 03/03/2024 1700   CALCIUM  10.5 (H) 03/03/2024 1700   GFRNONAA 60 (L) 11/06/2020 1114   GFRAA 86 02/29/2020 1010   Lab Results  Component Value Date   HGBA1C 5.8 11/24/2023   HGBA1C 7.2 (H) 06/29/2019   Lab Results  Component Value Date   INSULIN  19.7 03/17/2023   Lab Results  Component Value Date  TSH 0.47 08/03/2023   CBC    Component Value Date/Time   WBC 7.4 03/03/2024 1700   WBC 5.4 11/24/2023 0813   RBC 4.69 03/03/2024 1700   RBC 4.55 11/24/2023 0813   HGB 13.4 03/03/2024 1700   HCT 41.1 03/03/2024 1700   PLT 284 03/03/2024 1700   MCV 88 03/03/2024 1700   MCH 28.6 03/03/2024 1700   MCH 28.0 10/29/2020 0021   MCHC 32.6 03/03/2024 1700   MCHC 32.6 11/24/2023 0813   RDW 13.1 03/03/2024 1700   Iron Studies No results found for: IRON, TIBC, FERRITIN, IRONPCTSAT Lipid Panel     Component Value Date/Time   CHOL 184 08/03/2023 1006   CHOL 243 (H) 03/17/2023 1055   TRIG 103.0 08/03/2023 1006   HDL 52.70 08/03/2023 1006   HDL 68  03/17/2023 1055   CHOLHDL 3 08/03/2023 1006   VLDL 20.6 08/03/2023 1006   LDLCALC 110 (H) 08/03/2023 1006   LDLCALC 149 (H) 03/17/2023 1055   Hepatic Function Panel     Component Value Date/Time   PROT 6.6 08/03/2023 1006   PROT 6.6 04/20/2023 1136   ALBUMIN 4.1 08/03/2023 1006   ALBUMIN 3.9 02/29/2020 1010   AST 15 08/03/2023 1006   ALT 8 08/03/2023 1006   ALKPHOS 65 08/03/2023 1006   BILITOT 0.6 08/03/2023 1006   BILITOT 0.7 02/29/2020 1010      Component Value Date/Time   TSH 0.47 08/03/2023 1006   Nutritional Lab Results  Component Value Date   VD25OH 58.4 03/17/2023    Medications: Outpatient Encounter Medications as of 04/04/2024  Medication Sig Note   albuterol  (VENTOLIN  HFA) 108 (90 Base) MCG/ACT inhaler Inhale 2 puffs into the lungs every 4 (four) hours as needed for wheezing or shortness of breath. 05/08/2023: Pt takes as needed.   allopurinol  (ZYLOPRIM ) 100 MG tablet Take 1 tablet (100 mg total) by mouth daily.    amLODipine  (NORVASC ) 5 MG tablet Take 1 tablet (5 mg total) by mouth daily.    atorvastatin  (LIPITOR) 20 MG tablet Take 1 tablet (20 mg total) by mouth daily.    blood glucose meter kit and supplies KIT Dispense based on patient and insurance preference. Use up to four times daily as directed.    Blood Pressure Monitoring (BLOOD PRESSURE CUFF) MISC For daily monitoring as directed.    Calcium -Magnesium -Vitamin D  (CALCIUM  1200+D3 PO) Take 600 mg by mouth daily.    carvedilol  (COREG ) 25 MG tablet TAKE 1 TABLET(25 MG) BY MOUTH TWICE DAILY    famotidine  (PEPCID ) 40 MG tablet Take 1 tablet (40 mg total) by mouth in the morning.    fluticasone  (FLONASE ) 50 MCG/ACT nasal spray Place 1 spray into both nostrils daily. 05/08/2023: Pt takes as needed.   ipratropium-albuterol  (DUONEB) 0.5-2.5 (3) MG/3ML SOLN Take 3 mLs by nebulization every 6 (six) hours as needed (shortness of breath/wheezing).    levocetirizine (XYZAL ) 5 MG tablet Take 1 tablet (5 mg total) by  mouth every evening.    ONETOUCH ULTRA test strip USE TO TEST BLOOD SUGAR UP TO FOUR TIMES DAILY    potassium chloride  20 MEQ/15ML (10%) SOLN TAKE 15 MLS BY MOUTH DAILY AS NEEDED(WITH EACH DOSE OF TORSEMIDE )    sacubitril -valsartan  (ENTRESTO ) 49-51 MG Take 1 tablet by mouth 2 (two) times daily.    torsemide  (DEMADEX ) 20 MG tablet Take 1 tablet (20 mg total) by mouth daily as needed (swelling/fluid/weight gain). 05/08/2023: Pt takes as needed.   [DISCONTINUED] tirzepatide  (MOUNJARO ) 10 MG/0.5ML Pen Inject  10 mg into the skin once a week.    EPINEPHrine (EPIPEN 2-PAK) 0.3 mg/0.3 mL IJ SOAJ injection INJECT 0.3 MLS INTO THE MUSCLE PRN FOR ANAPHYLAXIS (Patient not taking: Reported on 04/04/2024)    tirzepatide  (MOUNJARO ) 10 MG/0.5ML Pen Inject 10 mg into the skin once a week.    No facility-administered encounter medications on file as of 04/04/2024.     Follow-Up   Return in about 4 weeks (around 05/02/2024) for For Weight Mangement with Dr. Francyne.SABRA She was informed of the importance of frequent follow up visits to maximize her success with intensive lifestyle modifications for her multiple health conditions.  Attestation Statement   Reviewed by clinician on day of visit: allergies, medications, problem list, medical history, surgical history, family history, social history, and previous encounter notes.     Lucas Francyne, MD

## 2024-04-04 NOTE — Assessment & Plan Note (Signed)
 HgbA1c is at goal for age and comorbid conditions. Denies symptoms of hypoglycemia or hyperglycemia. On Mounjaro  with good adherence and no side effects.   Lab Results  Component Value Date   HGBA1C 5.8 11/24/2023   HGBA1C 5.7 08/03/2023   HGBA1C 6.8 (H) 03/17/2023   Lab Results  Component Value Date   MICROALBUR 6.3 (H) 08/03/2023   LDLCALC 110 (H) 08/03/2023   CREATININE 0.76 03/03/2024   Continue current weight management strategy inclusive of GLP-1

## 2024-04-04 NOTE — Assessment & Plan Note (Signed)
 Blood pressure today is again elevated.  She has a history of diastolic heart failure and is followed by cardiology as well as primary care.  She is on carvedilol , amlodipine  and as needed torsemide .  She has been advised on journaling her blood pressure by cardiology but has not done so yet.  We counseled patient on the risk associated with uncontrolled blood pressure.  She may also have sleep apnea but is not interested in polysomnography.  She will be reaching out to her primary care doctor for medication adjustments.

## 2024-04-05 ENCOUNTER — Ambulatory Visit: Payer: Self-pay

## 2024-04-05 ENCOUNTER — Telehealth: Payer: Self-pay

## 2024-04-05 NOTE — Telephone Encounter (Signed)
 Called left a voicemail. Ask her to call me back

## 2024-04-05 NOTE — Telephone Encounter (Signed)
 FYI Only or Action Required?: FYI only for provider: appointment scheduled on 04/06/2024.  Patient was last seen in primary care on 04/04/2024 by Francyne Romano, MD.  Called Nurse Triage reporting Breathing Problem.  Symptoms began several weeks ago.  Interventions attempted: Nothing.  Symptoms are: gradually worsening.  Triage Disposition: See PCP When Office is Open (Within 3 Days)  Patient/caregiver understands and will follow disposition?: Yes           Copied from CRM [id number]. Topic: Clinical - Red Word Triage >> Apr 05, 2024 12:59 PM Alexandria E wrote: [facility] that prompted transfer to Nurse Triage: Patient has been having issues with breathing at night, potentially wanting to get in for a sleep study to see if a CPAP is the best route. Breathing issues may be contributing to elevated blood pressure, which patient has been having over the past 4 weeks. Reason for Disposition  [1] MODERATE longstanding difficulty breathing (e.g., speaks in phrases, SOB even at rest, pulse 100-120) AND [2] SAME as normal    Issues only occur at night  Answer Assessment - Initial Assessment Questions 1. RESPIRATORY STATUS: Describe your breathing? (e.g., wheezing, shortness of breath, unable to speak, severe coughing)      Issues with breathing at night time  2. ONSET: When did this breathing problem begin?      4 weeks  3. SEVERITY: How bad is your breathing? (e.g., mild, moderate, severe)      Symptoms occur at night causing her to not get a good night rest  4. RECURRENT SYMPTOM: Have you had difficulty breathing before? If Yes, ask: When was the last time? and What happened that time?      Denies  5. CARDIAC HISTORY: Do you have any history of heart disease? (e.g., heart attack, angina, bypass surgery, angioplasty)      High blood pressure  6. CAUSE: What do you think is causing the breathing problem?      Sleep apnea  7. OTHER SYMPTOMS: Do you have any other  symptoms? (e.g., chest pain, cough, dizziness, fever, runny nose)     Snoring, fatigue    Patient requesting to get a sleep study done. She states she has discussed symptoms with her weight control doctor. Patient states she has not been getting a good night's rest due to symptoms.  Protocols used: Breathing Difficulty-A-AH

## 2024-04-05 NOTE — Telephone Encounter (Signed)
 Patient has appt 11/12

## 2024-04-06 ENCOUNTER — Encounter: Payer: Self-pay | Admitting: Family Medicine

## 2024-04-06 ENCOUNTER — Ambulatory Visit (INDEPENDENT_AMBULATORY_CARE_PROVIDER_SITE_OTHER): Admitting: Family Medicine

## 2024-04-06 VITALS — BP 134/70 | HR 61 | Temp 98.8°F | Ht 59.0 in | Wt 205.0 lb

## 2024-04-06 DIAGNOSIS — R0683 Snoring: Secondary | ICD-10-CM

## 2024-04-06 DIAGNOSIS — I10 Essential (primary) hypertension: Secondary | ICD-10-CM | POA: Diagnosis not present

## 2024-04-06 DIAGNOSIS — E66813 Obesity, class 3: Secondary | ICD-10-CM | POA: Diagnosis not present

## 2024-04-06 DIAGNOSIS — Z6841 Body Mass Index (BMI) 40.0 and over, adult: Secondary | ICD-10-CM | POA: Diagnosis not present

## 2024-04-06 NOTE — Progress Notes (Signed)
 Established Patient Office Visit   Subjective  Patient ID: Beth Macias, female    DOB: 04-05-1951  Age: 73 y.o. MRN: [id number]  Chief Complaint  Patient presents with   Acute Visit    Patient came in today for sleep study referral, and elevated Blood pressure, 160's    PT is a 73 yo female seen for f/u and ongoing concern.  Pt states her bp has been elevated due to ongoing stress from family including several unexpected deaths since Spring.  Pt went on a 2 wk vacation with friends.  During the trip patient was told that she snores and stops breathing while sleeping.  Patient states she was unsure if she snored but was aware of gasping at night.  Wakes up feeling unrested.  Take naps during the day.  Taking Norvasc  5 mg daily, Coreg  25 mg twice daily, Entresto  49-51 mg for BP and CHF.  Followed by weight management but has not lost much weight.    Patient Active Problem List   Diagnosis Date Noted   BMI 39.0-39.9,adult 02/02/2024   Grief 10/15/2023   Class 3 severe obesity with serious comorbidity and body mass index (BMI) of 40.0 to 44.9 in adult [facility]) 09/07/2023   Physically inactive 07/07/2023   Other specified counseling 04/17/2023   At high risk for cardiovascular disease 03/31/2023   Statin medication declined by patient 03/31/2023   Generalized obesity starting BMI  44.5 03/17/2023   Suspected sleep apnea 03/17/2023   Chronic heart failure with preserved ejection fraction (HCC) 01/12/2023   Hypertension associated with type 2 diabetes mellitus (HCC) 01/12/2023   Hyperlipidemia associated with type 2 diabetes mellitus (HCC) 01/12/2023   Acute on chronic heart failure with preserved ejection fraction (HFpEF) (HCC) 10/27/2020   Hypertensive urgency 10/27/2020   Type 2 diabetes mellitus in patient with obesity (HCC) 10/27/2020   CHF (congestive heart failure) (HCC) 12/10/2018   Mild intermittent asthma without complication 06/30/2018   Environmental and seasonal  allergies 06/30/2018   Diabetes (HCC) 04/04/2018   Morbid obesity with BMI of 40.0-44.9, adult (HCC) 02/18/2016   Extrinsic asthma without complication 06/19/2015   Cubital tunnel syndrome, right 07/06/2014   Hypokalemia 08/16/2013   Past Medical History:  Diagnosis Date   Anxiety    Asthma    Back pain    Blood in stool    Chewing difficulty    CHF (congestive heart failure) (HCC)    Colon polyps    Constipation    Diabetes mellitus without complication (HCC)    Diverticulitis    Fibromyalgia    GERD (gastroesophageal reflux disease)    Gout    Hypercholesteremia    Hypertension    Joint pain    Lactose intolerance    Pneumonia    Pneumonia    Prediabetes    SOB (shortness of breath)    UTI (urinary tract infection)    Vitamin B 12 deficiency    Vitamin D  deficiency    Past Surgical History:  Procedure Laterality Date   APPENDECTOMY     CESAREAN SECTION     x 3   LIPOMA EXCISION     MENISCUS REPAIR Left    OVARIAN CYST REMOVAL     OVARIAN CYST SURGERY     Social History   Tobacco Use   Smoking status: Former    Current packs/day: 0.00    Average packs/day: 0.3 packs/day for 9.0 years (2.3 ttl pk-yrs)    Types: Cigarettes  Start date: 05/27/1967    Quit date: 05/26/1976    Years since quitting: 47.8   Smokeless tobacco: Never  Vaping Use   Vaping status: Never Used  Substance Use Topics   Alcohol use: Never   Drug use: Never   Family History  Problem Relation Age of Onset   Heart failure Mother    Gout Mother    Hypertension Mother    Diabetes Mother    High Cholesterol Mother    Heart disease Mother    Obesity Mother    Heart failure Father    Hypertension Father    AAA (abdominal aortic aneurysm) Father    Heart disease Father    Gout Brother    Hypertension Brother    Heart attack Brother    Colon cancer Maternal Uncle    Crohn's disease Paternal Aunt    Breast cancer Neg Hx    Esophageal cancer Neg Hx    Pancreatic cancer Neg Hx     Stomach cancer Neg Hx    Allergies  Allergen Reactions   Strawberry (Diagnostic) Anaphylaxis and Other (See Comments)    hives   Farxiga  [Dapagliflozin ] Other (See Comments)    Had HAs, weakness, rhinorrhea, jitteriness, nasal congestion, nocturia, yeast infections, memory changes, irritability.   Penicillins Itching    Has patient had a PCN reaction causing immediate rash, facial/tongue/throat swelling, SOB or lightheadedness with hypotension: No Has patient had a PCN reaction causing severe rash involving mucus membranes or skin necrosis: No Has patient had a PCN reaction that required hospitalization: Unknown Has patient had a PCN reaction occurring within the last 10 years: No If all of the above answers are NO, then may proceed with Cephalosporin use.   Amoxicillin Other (See Comments)   Furosemide  Hives   Rosuvastatin  Other (See Comments)    Bloating, flatulence   Spironolactone  Hives   Gabapentin Itching, Rash and Other (See Comments)    Incoherent   Tape Other (See Comments)    Blisters    ROS Negative unless stated above    Objective:     BP 134/70 (BP Location: Left Arm, Patient Position: Sitting, Cuff Size: Large)   Pulse 61   Temp 98.8 F (37.1 C) (Oral)   Ht 4' 11 (1.499 m)   Wt 205 lb (93 kg)   SpO2 98%   BMI 41.40 kg/m  BP Readings from Last 3 Encounters:  04/06/24 134/70  04/04/24 (!) 151/79  03/16/24 (!) 142/89   Wt Readings from Last 3 Encounters:  04/06/24 205 lb (93 kg)  04/04/24 197 lb (89.4 kg)  03/09/24 201 lb (91.2 kg)      Physical Exam Constitutional:      General: She is not in acute distress.    Appearance: Normal appearance.  HENT:     Head: Normocephalic and atraumatic.     Nose: Nose normal.     Mouth/Throat:     Mouth: Mucous membranes are moist.  Cardiovascular:     Rate and Rhythm: Normal rate and regular rhythm.     Heart sounds: Normal heart sounds. No murmur heard.    No gallop.  Pulmonary:     Effort:  Pulmonary effort is normal. No respiratory distress.     Breath sounds: Normal breath sounds. No wheezing, rhonchi or rales.  Skin:    General: Skin is warm and dry.  Neurological:     Mental Status: She is alert and oriented to person, place, and time.  04/06/2024    4:16 PM 03/09/2024    1:20 PM 11/06/2023    4:32 PM  Depression screen PHQ 2/9  Decreased Interest 0 0 0  Down, Depressed, Hopeless 1 0 0  PHQ - 2 Score 1 0 0  Altered sleeping 1  2  Tired, decreased energy 1  2  Change in appetite 0  0  Feeling bad or failure about yourself  0  0  Trouble concentrating 1  0  Moving slowly or fidgety/restless 1  0  Suicidal thoughts 0  0  PHQ-9 Score 5  4   Difficult doing work/chores Not difficult at all  Not difficult at all     Data saved with a previous flowsheet row definition      04/06/2024    4:16 PM 11/06/2023    4:33 PM 08/03/2023    9:52 AM 04/17/2023    9:41 AM  GAD 7 : Generalized Anxiety Score  Nervous, Anxious, on Edge 1 1 0 0  Control/stop worrying 1 1 0 0  Worry too much - different things 1 1 0 0  Trouble relaxing 1 0 1 0  Restless 0 0 0 0  Easily annoyed or irritable 0 1 1 0  Afraid - awful might happen 0 0 0 0  Total GAD 7 Score 4 4 2  0  Anxiety Difficulty Somewhat difficult Somewhat difficult Not difficult at all Not difficult at all     No results found for any visits on 04/06/24.    Assessment & Plan:   Essential hypertension -     Pulmonary Visit  Snoring -     Pulmonary Visit  Class 3 severe obesity with serious comorbidity and body mass index (BMI) of 40.0 to 44.9 in adult, unspecified obesity type (HCC) -     Pulmonary Visit  BP controlled this visit.  Concern stress and OSA may be contributing to elevations at other offices.  Referral to pulm for sleep study.  Continue current meds including norvasc  5 mg daily, coreg  25 mg BID, entresto  49/51 mg BID.  Lifestyle modifications and wt loss strongly encouraged.  Return in about  3 months (around 07/07/2024), or if symptoms worsen or fail to improve, for blood pressure.   Clotilda JONELLE Single, MD

## 2024-04-11 ENCOUNTER — Telehealth: Payer: Self-pay

## 2024-04-11 NOTE — Telephone Encounter (Signed)
 Called patient to schedule 5 week PharmD HTN f/u. NR-LVM instructing patient to call back to schedule. Favorably, patient recently seen by PCP on 04/06/24 where BP was controlled.

## 2024-04-13 ENCOUNTER — Other Ambulatory Visit (HOSPITAL_COMMUNITY)

## 2024-04-15 ENCOUNTER — Ambulatory Visit: Admitting: Cardiology

## 2024-04-15 ENCOUNTER — Other Ambulatory Visit (HOSPITAL_COMMUNITY)

## 2024-04-18 ENCOUNTER — Telehealth: Payer: Self-pay

## 2024-04-18 NOTE — Telephone Encounter (Signed)
 She returned my call, explained PREP, she prefers HT in January; will ask [facility] to contact her end of December with schedule at [facility] in Jan.

## 2024-04-18 NOTE — Telephone Encounter (Signed)
 Please see other encounter.

## 2024-04-18 NOTE — Telephone Encounter (Signed)
 Sent a MyChart message, which was read on 04/14/24, and attempted phone contact on 11/17 (NR-LVM) as the final outreach attempt.

## 2024-04-18 NOTE — Telephone Encounter (Signed)
 Called  re: PREP program referral, left voicemail requesting return call.

## 2024-04-28 DIAGNOSIS — H52223 Regular astigmatism, bilateral: Secondary | ICD-10-CM | POA: Diagnosis not present

## 2024-04-29 ENCOUNTER — Other Ambulatory Visit (INDEPENDENT_AMBULATORY_CARE_PROVIDER_SITE_OTHER): Payer: Self-pay | Admitting: Internal Medicine

## 2024-04-29 DIAGNOSIS — Z9189 Other specified personal risk factors, not elsewhere classified: Secondary | ICD-10-CM

## 2024-05-17 ENCOUNTER — Telehealth: Payer: Self-pay

## 2024-05-17 NOTE — Telephone Encounter (Signed)
 Copied from CRM #[id number]. Topic: Clinical - Medication Question >> May 17, 2024  4:25 PM Alexandria E wrote: Reason for CRM: Patient got a letter from her insurance that her plan will not cover certain medications moving into 2026. Patient would like to discuss this letter with PCP's and questioning if alternative medications would be available.

## 2024-05-17 NOTE — Telephone Encounter (Signed)
 Left message about upcoming class at [facility] and asked her to return my call.

## 2024-05-24 NOTE — Telephone Encounter (Signed)
 Spoke with patient, patient needs Famotidine  40mg  and montelukast  10mg  changed per insurance

## 2024-05-30 ENCOUNTER — Ambulatory Visit (INDEPENDENT_AMBULATORY_CARE_PROVIDER_SITE_OTHER): Admitting: Internal Medicine

## 2024-05-30 ENCOUNTER — Encounter (INDEPENDENT_AMBULATORY_CARE_PROVIDER_SITE_OTHER): Payer: Self-pay | Admitting: Internal Medicine

## 2024-05-30 VITALS — BP 138/86 | HR 71 | Temp 98.3°F | Ht 59.0 in | Wt 192.0 lb

## 2024-05-30 DIAGNOSIS — E119 Type 2 diabetes mellitus without complications: Secondary | ICD-10-CM

## 2024-05-30 DIAGNOSIS — E669 Obesity, unspecified: Secondary | ICD-10-CM

## 2024-05-30 DIAGNOSIS — E66812 Obesity, class 2: Secondary | ICD-10-CM | POA: Diagnosis not present

## 2024-05-30 DIAGNOSIS — I152 Hypertension secondary to endocrine disorders: Secondary | ICD-10-CM

## 2024-05-30 DIAGNOSIS — Z6838 Body mass index (BMI) 38.0-38.9, adult: Secondary | ICD-10-CM

## 2024-05-30 DIAGNOSIS — E1159 Type 2 diabetes mellitus with other circulatory complications: Secondary | ICD-10-CM

## 2024-05-30 DIAGNOSIS — I110 Hypertensive heart disease with heart failure: Secondary | ICD-10-CM | POA: Diagnosis not present

## 2024-05-30 DIAGNOSIS — R29818 Other symptoms and signs involving the nervous system: Secondary | ICD-10-CM | POA: Diagnosis not present

## 2024-05-30 DIAGNOSIS — Z7985 Long-term (current) use of injectable non-insulin antidiabetic drugs: Secondary | ICD-10-CM

## 2024-05-30 DIAGNOSIS — I5032 Chronic diastolic (congestive) heart failure: Secondary | ICD-10-CM

## 2024-05-30 MED ORDER — TIRZEPATIDE 10 MG/0.5ML ~~LOC~~ SOAJ: 10.0000 mg | SUBCUTANEOUS | 1 refills | Status: AC

## 2024-05-30 NOTE — Assessment & Plan Note (Addendum)
 Blood pressure today is again elevated.  She has a history of diastolic heart failure and is followed by cardiology as well as primary care.  She is on carvedilol , amlodipine  and as needed torsemide .  She has been advised on journaling her blood pressure by cardiology but has not done so yet.  We counseled patient on the risk associated with uncontrolled blood pressure.  She may also have sleep apnea but is not interested in polysomnography.  Patient also advised on avoidance of supplements which may elevate her blood pressure.

## 2024-05-30 NOTE — Progress Notes (Signed)
 "  Office: [phone]  /  Fax: [phone]  Weight Summary and Body Composition Analysis (BIA)  Vitals Temp: 98.3 F (36.8 C) BP: 138/86 Pulse Rate: 71   Anthropometric Measurements Height: 4' 11 (1.499 m) Weight: 192 lb (87.1 kg) BMI (Calculated): 38.76 Weight at Last Visit: 197 lb Weight Lost Since Last Visit: 5 lb Weight Gained Since Last Visit: 0 lb Starting Weight: 220 lb Total Weight Loss (lbs): 28 lb (12.7 kg) Peak Weight: 230 lb   Body Composition  Body Fat %: 51.7 % Fat Mass (lbs): 99.4 lbs Muscle Mass (lbs): 88 lbs Total Body Water (lbs): 72 lbs Visceral Fat Rating : 18    RMR: [id number]  Today's Visit #: 14  Starting Date: 03/17/23   Subjective   Chief Complaint: Obesity  Interval History  Discussed the use of AI scribe software for clinical note transcription with the patient, who gave verbal consent to proceed.  History of Present Illness  Patient advised on increasing more whole foods and reducingPauletta B Macias is a 74 year old female with obesity, type 2 diabetes, and hypertension who presents for medical weight management.  She is currently on Mounjaro  10 mg once a week for weight management and diabetes, which she has been taking since December 2024. Since her last visit, she has lost five pounds and is following a reduced calorie nutrition plan of 1200 calories per day. She exercises two days a week for 20 minutes. She has lost a total of 39 pounds since April 2024, with a body fat percentage reduction from 57% to 51%.  For her type 2 diabetes, she is on Mounjaro  10 mg once a week. She is not currently fasting, having had a banana and half a glass of milk this morning.  She is taking amlodipine  5 mg and carvedilol  25 mg twice a day for hypertension. She also takes Entresto  for heart failure with preserved ejection fraction and atorvastatin  20 mg daily for cholesterol management.  She suspects she has sleep apnea but has declined  polysomnography. She reports improved sleep since the passing of her dog, who used to disturb her at night.  She recently purchased a supplement containing chromium and 15 other ingredients.  She has brought it for review.    Challenges affecting patient progress: Low volume of physical activity   Pharmacotherapy for weight management: She is currently taking : Mounjaro  10 mg once a week.   Assessment and Plan   Treatment Plan For Obesity:  Recommended Dietary Goals  Cassidey is currently in the action stage of change. As such, her goal is to continue weight management plan. She has agreed to: continue current plan.   Behavioral Health and Counseling  We discussed the following behavioral modification strategies today: continue to work on maintaining a reduced calorie state, getting the recommended amount of protein, incorporating whole foods, making healthy choices, staying well hydrated and practicing mindfulness when eating. and increase protein intake, fibrous foods (25 grams per day for women, 30 grams for men) and water to improve satiety and decrease hunger signals. .  Additional education and resources provided today: None  Recommended Physical Activity Goals  [name] has been advised to work up to 150 minutes of moderate intensity aerobic activity a week and strengthening exercises 2-3 times per week for cardiovascular health, weight loss maintenance and preservation of muscle mass.  She has agreed to :  Think about enjoyable ways to increase daily physical activity and overcoming barriers to exercise, Increase physical activity  in their day and reduce sedentary time (increase NEAT)., Increase volume of physical activity to a goal of 240 minutes a week, and Combine aerobic and strengthening exercises for efficiency and improved cardiometabolic health.  Medical Interventions and Pharmacotherapy  We discussed various medication options to help [name] with her weight loss  efforts and we both agreed to : Adequate clinical response to anti-obesity medication, continue current anti-obesity regimen  Associated Conditions Impacted by Obesity Treatment  Assessment & Plan Hypertension associated with type 2 diabetes mellitus (HCC) Blood pressure today is again elevated.  She has a history of diastolic heart failure and is followed by cardiology as well as primary care.  She is on carvedilol , amlodipine  and as needed torsemide .  She has been advised on journaling her blood pressure by cardiology but has not done so yet.  We counseled patient on the risk associated with uncontrolled blood pressure.  She may also have sleep apnea but is not interested in polysomnography.  Patient also advised on avoidance of supplements which may elevate her blood pressure. Chronic heart failure with preserved ejection fraction (HCC) Echocardiogram from 2012 showed she has LVH with grade 1 diastolic dysfunction.  She is followed by cardiology and is currently on Entresto , carvedilol , amlodipine  and as needed use of torsemide .  She is now on GLP-1 therapy which has been shown to be beneficial in heart failure with preserved ejection fraction.  We will check renal parameters and electrolytes that she has not had any recent labs for monitoring. Class 2 severe obesity with serious comorbidity and body mass index (BMI) of 38.0 to 38.9 in adult, unspecified obesity type Type 2 diabetes mellitus in patient with obesity (HCC) Obesity with associated type 2 diabetes mellitus Obesity with associated type 2 diabetes mellitus, managed with Mounjaro  10 mg weekly. Weight loss of 39 pounds since April 2024, with a current body fat percentage of 15.1%. Mounjaro  aids in weight loss, diabetes management, and reduces cardiovascular risk. She is following a 1200 calorie nutrition plan and exercises twice a week for 20 minutes. Discussed the importance of gradual weight loss to prevent muscle loss and metabolic  slowdown. - Continue Mounjaro  10 mg weekly - Continue 1200 calorie nutrition plan - Work on increasing volume of physical activity - Will schedule fasting blood disease monitoring labs at the next office visit Suspected sleep apnea  She has symptoms of disordered sleep breathing suspicious for sleep apnea but she is not interested in having sleep study nor using CPAP therapy we will therefore hold off polysomnography.  She will continue on medically supervised weight management plan inclusive of GLP-1.  Losing 15% of body weight may reduce AHI.  She also reports sleep has improved since the passing of her dog her sleep is no longer interrupted at night.   Counseling on complementary and alternative medicine use.  She purchased a supplement containing chromium picolinate but also had 15 other components. Counseled on the risks of multi-supplement use, including potential kidney and liver injury. Discussed the lack of regulation and potential harm of supplements, particularly those with stimulants. Advised to avoid multi-supplement combinations and to look for certifications such as USP or NSF. - Avoid multi-supplement combinations - Look for USP or NSF certifications on supplements       Objective   Physical Exam:  Blood pressure 138/86, pulse 71, temperature 98.3 F (36.8 C), height 4' 11 (1.499 m), weight 192 lb (87.1 kg). Body mass index is 38.78 kg/m.  General: She is overweight, cooperative, alert,  well developed, and in no acute distress. PSYCH: Has normal mood, affect and thought process.   HEENT: EOMI, sclerae are anicteric. Lungs: Normal breathing effort, no conversational dyspnea. Extremities: No edema.  Neurologic: No gross sensory or motor deficits. No tremors or fasciculations noted.    Diagnostic Data Reviewed:  BMET    Component Value Date/Time   NA 140 03/03/2024 1700   K 3.9 03/03/2024 1700   CL 102 03/03/2024 1700   CO2 24 03/03/2024 1700   GLUCOSE 83  03/03/2024 1700   GLUCOSE 114 (H) 08/03/2023 1006   BUN 20 03/03/2024 1700   CREATININE 0.76 03/03/2024 1700   CALCIUM  10.5 (H) 03/03/2024 1700   GFRNONAA 60 (L) 11/06/2020 1114   GFRAA 86 02/29/2020 1010   Lab Results  Component Value Date   HGBA1C 5.8 11/24/2023   HGBA1C 7.2 (H) 06/29/2019   Lab Results  Component Value Date   INSULIN  19.7 03/17/2023   Lab Results  Component Value Date   TSH 0.47 08/03/2023   CBC    Component Value Date/Time   WBC 7.4 03/03/2024 1700   WBC 5.4 11/24/2023 0813   RBC 4.69 03/03/2024 1700   RBC 4.55 11/24/2023 0813   HGB 13.4 03/03/2024 1700   HCT 41.1 03/03/2024 1700   PLT 284 03/03/2024 1700   MCV 88 03/03/2024 1700   MCH 28.6 03/03/2024 1700   MCH 28.0 10/29/2020 0021   MCHC 32.6 03/03/2024 1700   MCHC 32.6 11/24/2023 0813   RDW 13.1 03/03/2024 1700   Iron Studies No results found for: IRON, TIBC, FERRITIN, IRONPCTSAT Lipid Panel     Component Value Date/Time   CHOL 184 08/03/2023 1006   CHOL 243 (H) 03/17/2023 1055   TRIG 103.0 08/03/2023 1006   HDL 52.70 08/03/2023 1006   HDL 68 03/17/2023 1055   CHOLHDL 3 08/03/2023 1006   VLDL 20.6 08/03/2023 1006   LDLCALC 110 (H) 08/03/2023 1006   LDLCALC 149 (H) 03/17/2023 1055   Hepatic Function Panel     Component Value Date/Time   PROT 6.6 08/03/2023 1006   PROT 6.6 04/20/2023 1136   ALBUMIN 4.1 08/03/2023 1006   ALBUMIN 3.9 02/29/2020 1010   AST 15 08/03/2023 1006   ALT 8 08/03/2023 1006   ALKPHOS 65 08/03/2023 1006   BILITOT 0.6 08/03/2023 1006   BILITOT 0.7 02/29/2020 1010      Component Value Date/Time   TSH 0.47 08/03/2023 1006   Nutritional Lab Results  Component Value Date   VD25OH 58.4 03/17/2023    Medications: Outpatient Encounter Medications as of 05/30/2024  Medication Sig Note   albuterol  (VENTOLIN  HFA) 108 (90 Base) MCG/ACT inhaler Inhale 2 puffs into the lungs every 4 (four) hours as needed for wheezing or shortness of breath. 05/08/2023:  Pt takes as needed.   allopurinol  (ZYLOPRIM ) 100 MG tablet Take 1 tablet (100 mg total) by mouth daily.    amLODipine  (NORVASC ) 5 MG tablet Take 1 tablet (5 mg total) by mouth daily.    atorvastatin  (LIPITOR) 20 MG tablet Take 1 tablet (20 mg total) by mouth daily.    blood glucose meter kit and supplies KIT Dispense based on patient and insurance preference. Use up to four times daily as directed.    Blood Pressure Monitoring (BLOOD PRESSURE CUFF) MISC For daily monitoring as directed.    Calcium -Magnesium -Vitamin D  (CALCIUM  1200+D3 PO) Take 600 mg by mouth daily.    carvedilol  (COREG ) 25 MG tablet TAKE 1 TABLET(25 MG) BY MOUTH TWICE  DAILY    famotidine  (PEPCID ) 40 MG tablet Take 1 tablet (40 mg total) by mouth in the morning.    fluticasone  (FLONASE ) 50 MCG/ACT nasal spray Place 1 spray into both nostrils daily. 05/08/2023: Pt takes as needed.   ipratropium-albuterol  (DUONEB) 0.5-2.5 (3) MG/3ML SOLN Take 3 mLs by nebulization every 6 (six) hours as needed (shortness of breath/wheezing).    levocetirizine (XYZAL ) 5 MG tablet Take 1 tablet (5 mg total) by mouth every evening.    ONETOUCH ULTRA test strip USE TO TEST BLOOD SUGAR UP TO FOUR TIMES DAILY    potassium chloride  20 MEQ/15ML (10%) SOLN TAKE 15 MLS BY MOUTH DAILY AS NEEDED(WITH EACH DOSE OF TORSEMIDE )    sacubitril -valsartan  (ENTRESTO ) 49-51 MG Take 1 tablet by mouth 2 (two) times daily.    torsemide  (DEMADEX ) 20 MG tablet Take 1 tablet (20 mg total) by mouth daily as needed (swelling/fluid/weight gain). 05/08/2023: Pt takes as needed.   [DISCONTINUED] tirzepatide  (MOUNJARO ) 10 MG/0.5ML Pen Inject 10 mg into the skin once a week.    EPINEPHrine (EPIPEN 2-PAK) 0.3 mg/0.3 mL IJ SOAJ injection INJECT 0.3 MLS INTO THE MUSCLE PRN FOR ANAPHYLAXIS (Patient not taking: Reported on 05/30/2024)    tirzepatide  (MOUNJARO ) 10 MG/0.5ML Pen Inject 10 mg into the skin once a week.    No facility-administered encounter medications on file as of 05/30/2024.      Follow-Up   Return in about 4 weeks (around 06/27/2024) for Fasting for labs.. She was informed of the importance of frequent follow up visits to maximize her success with intensive lifestyle modifications for her multiple health conditions.  Attestation Statement   Reviewed by clinician on day of visit: allergies, medications, problem list, medical history, surgical history, family history, social history, and previous encounter notes.   I have spent 41 minutes in the care of the patient today including: 2 minutes before the visit reviewing and preparing the chart. 34 minutes face-to-face assessing and reviewing listed medical problems as outlined in obesity care plan, providing nutritional and behavioral counseling on topics outlined in the obesity care plan, counseling regarding anti-obesity medication as outlined in obesity care plan, independently interpreting test results and goals of care, as described in assessment and plan, reviewing and discussing biometric information and progress, and ordering medications - see orders 5 minutes after the visit updating chart and documentation of encounter.    Lucas Parker, MD  "

## 2024-05-30 NOTE — Assessment & Plan Note (Addendum)
 Obesity with associated type 2 diabetes mellitus Obesity with associated type 2 diabetes mellitus, managed with Mounjaro  10 mg weekly. Weight loss of 39 pounds since April 2024, with a current body fat percentage of 15.1%. Mounjaro  aids in weight loss, diabetes management, and reduces cardiovascular risk. She is following a 1200 calorie nutrition plan and exercises twice a week for 20 minutes. Discussed the importance of gradual weight loss to prevent muscle loss and metabolic slowdown. - Continue Mounjaro  10 mg weekly - Continue 1200 calorie nutrition plan - Work on increasing volume of physical activity - Will schedule fasting blood disease monitoring labs at the next office visit

## 2024-05-30 NOTE — Assessment & Plan Note (Addendum)
 Echocardiogram from 2012 showed she has LVH with grade 1 diastolic dysfunction.  She is followed by cardiology and is currently on Entresto , carvedilol , amlodipine  and as needed use of torsemide .  She is now on GLP-1 therapy which has been shown to be beneficial in heart failure with preserved ejection fraction.  We will check renal parameters and electrolytes that she has not had any recent labs for monitoring.

## 2024-05-30 NOTE — Assessment & Plan Note (Signed)
"   She has symptoms of disordered sleep breathing suspicious for sleep apnea but she is not interested in having sleep study nor using CPAP therapy we will therefore hold off polysomnography.  She will continue on medically supervised weight management plan inclusive of GLP-1.  Losing 15% of body weight may reduce AHI.  She also reports sleep has improved since the passing of her dog her sleep is no longer interrupted at night. "

## 2024-06-01 ENCOUNTER — Telehealth (INDEPENDENT_AMBULATORY_CARE_PROVIDER_SITE_OTHER): Payer: Self-pay | Admitting: Internal Medicine

## 2024-06-01 NOTE — Telephone Encounter (Signed)
 Pt called and went to pick up the mounjaro  and it was 365.00. Pt did not pick it up. She called her insurance, on the phone for an hour or so and they are asking for a part D form to be faxed to them at [phone] from our office.  If you could please call pt and let her know it has been done as she does not use mychart. She would like to pick up tomorrow

## 2024-06-01 NOTE — Telephone Encounter (Signed)
 Called pt. She will call her insurance to see if Ozempic is covered and less, if not, what is, and let us  know.

## 2024-06-01 NOTE — Telephone Encounter (Signed)
 Pt called stating the medication (Mounjaro ) was $300.00 at her pharmacy [facility]). Pt wants to know if there is a cheaper replacement for Mounjaro . Patient is on a low income. Please call the patient.

## 2024-06-02 ENCOUNTER — Ambulatory Visit: Admitting: Podiatry

## 2024-06-02 ENCOUNTER — Telehealth (INDEPENDENT_AMBULATORY_CARE_PROVIDER_SITE_OTHER): Payer: Self-pay | Admitting: Internal Medicine

## 2024-06-02 DIAGNOSIS — E119 Type 2 diabetes mellitus without complications: Secondary | ICD-10-CM

## 2024-06-02 DIAGNOSIS — L84 Corns and callosities: Secondary | ICD-10-CM | POA: Diagnosis not present

## 2024-06-02 NOTE — Telephone Encounter (Signed)
 Patient called stating that her insurance company stated that she can get a reduced price for her Mounjaro . Mounjaro  is currently $350.00. Pt states that [employer] will approved a reduced cost if Dr. Francyne request it for the patient. There are also replacement medications available. Patient states that she cannot afford the Mounjaro  currently. Patient would like a call back.

## 2024-06-06 ENCOUNTER — Telehealth (INDEPENDENT_AMBULATORY_CARE_PROVIDER_SITE_OTHER): Payer: Self-pay

## 2024-06-06 ENCOUNTER — Telehealth: Payer: Self-pay

## 2024-06-06 NOTE — Telephone Encounter (Signed)
 Mounjaro  5 mg started

## 2024-06-06 NOTE — Telephone Encounter (Signed)
 Left message about upcoming PREP Class at [facility] and asked her to return my call.

## 2024-06-07 ENCOUNTER — Encounter (INDEPENDENT_AMBULATORY_CARE_PROVIDER_SITE_OTHER): Payer: Self-pay

## 2024-06-07 NOTE — Telephone Encounter (Signed)
 PA - Mounjaro  2.5 denied (tier exceptions criteria requirements not met). Pt notified

## 2024-06-08 ENCOUNTER — Other Ambulatory Visit: Payer: Self-pay | Admitting: Family Medicine

## 2024-06-08 DIAGNOSIS — J3089 Other allergic rhinitis: Secondary | ICD-10-CM

## 2024-06-09 ENCOUNTER — Encounter: Payer: Self-pay | Admitting: Podiatry

## 2024-06-09 NOTE — Progress Notes (Signed)
 "  Subjective:  Patient ID: Beth Macias, female    DOB: 1950/10/17,  MRN: [id number]  Beth Macias presents to clinic today for preventative diabetic foot care and callus(es) of both feet and painful thick toenails that are difficult to trim. Painful toenails interfere with ambulation. Aggravating factors include wearing enclosed shoe gear. Pain is relieved with periodic professional debridement. Painful calluses are aggravated when weightbearing with and without shoegear. Pain is relieved with periodic professional debridement.  Chief Complaint  Patient presents with   Nail Problem    Thick painful toenails, 6 month follow up    Diabetes    A1C  5.8   New problem(s): None.   PCP is Mercer Clotilda SAUNDERS, MD.  Allergies[1]  Review of Systems: Negative except as noted in the HPI.  Objective: No changes noted in today's physical examination. There were no vitals filed for this visit. Beth Macias is a pleasant 74 y.o. female in NAD. AAO x 3.  Vascular Examination: Capillary refill time immediate b/l. Vascular status intact b/l with palpable DP pulses; faintly palpable PT pulses. Pedal hair diminished b/l. Trace edema b/l ankles. Edema noted right 2nd digit at DIPJ. No pain with calf compression b/l. Skin temperature gradient WNL b/l. No cyanosis or clubbing noted b/l. No ischemia or gangrene b/l.   Neurological Examination: Sensation grossly intact b/l with 10 gram monofilament. Vibratory sensation intact b/l.   Dermatological Examination: Right 2nd digit with mild ecchymosis, No damage to nailbed/nail plate. No erythema, no edema, no subungual drainage. Pedal skin with normal turgor, texture and tone b/l.  No open wounds. No interdigital macerations.   There is evidence of subacute subungual hematoma of the right second digit. Nailplate remains adhered. There is no  tenderness to palpation.  Toenails 1-5 b/l thick, discolored, elongated with subungual debris and pain on  dorsal palpation.   Hyperkeratotic lesion(s) medial IPJ bilateral great toes.  No erythema, no edema, no drainage, no fluctuance.  Musculoskeletal Examination: Muscle strength 5/5 to all lower extremity muscle groups bilaterally. Pain on palpation to right 2nd digit. Pes planus deformity noted bilateral LE. Pain on palpation left calcaneal tubercle.  Assessment/Plan: 1. Callus   2. Type 2 diabetes mellitus without complication, without long-term current use of insulin  [facility])   Consent given for treatment. Patient examined. All patient's and/or POA's questions/concerns addressed on today's visit. As a courtesy, mycotic toenails 1-5 b/l  debrided in length and girth without incident. Callus(es) medial IPJ of left great toe and medial IPJ of right great toe pared with sharp debridement without incident. Continue foot and shoe inspections daily. Monitor blood glucose per PCP/Endocrinologist's recommendations.Continue soft, supportive shoe gear daily. Report any pedal injuries to medical professional. Call office if there are any quesitons/concerns. Return in about 3 months (around 08/31/2024).  Delon LITTIE Merlin, DPM      [place] LOCATION: [address], [place] [address]                   Office [phone]   [place] LOCATION: [address] [place], [place] [address] Office [phone]     [1]  Allergies Allergen Reactions   Strawberry (Diagnostic) Anaphylaxis and Other (See Comments)  hives   Farxiga  [Dapagliflozin ] Other (See Comments)    Had HAs, weakness, rhinorrhea, jitteriness, nasal congestion, nocturia, yeast infections, memory changes, irritability.   Penicillins Itching    Has patient had a PCN reaction causing immediate rash, facial/tongue/throat swelling, SOB or lightheadedness with hypotension: No Has patient had a PCN reaction causing severe rash involving mucus membranes or skin necrosis: No Has  patient had a PCN reaction that required hospitalization: Unknown Has patient had a PCN reaction occurring within the last 10 years: No If all of the above answers are NO, then may proceed with Cephalosporin use.   Amoxicillin Other (See Comments)   Furosemide  Hives   Rosuvastatin  Other (See Comments)    Bloating, flatulence   Spironolactone  Hives   Gabapentin Itching, Rash and Other (See Comments)    Incoherent   Tape Other (See Comments)    Blisters   "

## 2024-06-18 ENCOUNTER — Other Ambulatory Visit: Payer: Self-pay | Admitting: Family Medicine

## 2024-06-18 DIAGNOSIS — R600 Localized edema: Secondary | ICD-10-CM

## 2024-06-30 ENCOUNTER — Ambulatory Visit (INDEPENDENT_AMBULATORY_CARE_PROVIDER_SITE_OTHER): Admitting: Internal Medicine

## 2024-07-18 ENCOUNTER — Ambulatory Visit (INDEPENDENT_AMBULATORY_CARE_PROVIDER_SITE_OTHER): Admitting: Internal Medicine

## 2024-09-01 ENCOUNTER — Ambulatory Visit: Admitting: Podiatry

## 2025-03-15 ENCOUNTER — Ambulatory Visit
